# Patient Record
Sex: Male | Born: 1945 | Race: White | Hispanic: No | Marital: Married | Smoking: Never smoker
Health system: Southern US, Community
[De-identification: ages and names within clinical notes are randomized; demographics above are authoritative.]

## PROBLEM LIST (undated history)

## (undated) DIAGNOSIS — I10 Essential (primary) hypertension: Secondary | ICD-10-CM

## (undated) DIAGNOSIS — I2510 Atherosclerotic heart disease of native coronary artery without angina pectoris: Secondary | ICD-10-CM

## (undated) DIAGNOSIS — D649 Anemia, unspecified: Secondary | ICD-10-CM

## (undated) DIAGNOSIS — S81012A Laceration without foreign body, left knee, initial encounter: Secondary | ICD-10-CM

## (undated) DIAGNOSIS — D563 Thalassemia minor: Secondary | ICD-10-CM

## (undated) DIAGNOSIS — E119 Type 2 diabetes mellitus without complications: Secondary | ICD-10-CM

## (undated) DIAGNOSIS — D696 Thrombocytopenia, unspecified: Secondary | ICD-10-CM

## (undated) DIAGNOSIS — E785 Hyperlipidemia, unspecified: Secondary | ICD-10-CM

## (undated) DIAGNOSIS — Z7982 Long term (current) use of aspirin: Secondary | ICD-10-CM

## (undated) DIAGNOSIS — I498 Other specified cardiac arrhythmias: Secondary | ICD-10-CM

## (undated) DIAGNOSIS — N1831 Chronic kidney disease, stage 3a: Secondary | ICD-10-CM

## (undated) DIAGNOSIS — I200 Unstable angina: Secondary | ICD-10-CM

## (undated) DIAGNOSIS — M720 Palmar fascial fibromatosis [Dupuytren]: Secondary | ICD-10-CM

## (undated) DIAGNOSIS — R0600 Dyspnea, unspecified: Secondary | ICD-10-CM

## (undated) DIAGNOSIS — I447 Left bundle-branch block, unspecified: Secondary | ICD-10-CM

## (undated) HISTORY — PX: DUPUYTREN CONTRACTURE RELEASE: SHX1478

## (undated) HISTORY — PX: KNEE ARTHROSCOPY: SHX127

## (undated) HISTORY — DX: Essential (primary) hypertension: I10

## (undated) HISTORY — PX: EYE SURGERY: SHX253

---

## 2004-10-01 ENCOUNTER — Ambulatory Visit: Payer: Self-pay | Admitting: Family Medicine

## 2008-12-20 ENCOUNTER — Ambulatory Visit: Payer: Self-pay | Admitting: Family Medicine

## 2011-07-02 DIAGNOSIS — M6530 Trigger finger, unspecified finger: Secondary | ICD-10-CM | POA: Diagnosis not present

## 2011-07-05 DIAGNOSIS — Z23 Encounter for immunization: Secondary | ICD-10-CM | POA: Diagnosis not present

## 2011-07-08 DIAGNOSIS — E119 Type 2 diabetes mellitus without complications: Secondary | ICD-10-CM | POA: Diagnosis not present

## 2011-07-08 DIAGNOSIS — H2510 Age-related nuclear cataract, unspecified eye: Secondary | ICD-10-CM | POA: Diagnosis not present

## 2011-07-16 ENCOUNTER — Ambulatory Visit: Payer: Self-pay | Admitting: Unknown Physician Specialty

## 2011-07-16 DIAGNOSIS — E119 Type 2 diabetes mellitus without complications: Secondary | ICD-10-CM | POA: Diagnosis not present

## 2011-07-16 DIAGNOSIS — M720 Palmar fascial fibromatosis [Dupuytren]: Secondary | ICD-10-CM | POA: Diagnosis not present

## 2011-07-16 DIAGNOSIS — Z01812 Encounter for preprocedural laboratory examination: Secondary | ICD-10-CM | POA: Diagnosis not present

## 2011-07-16 DIAGNOSIS — Z01810 Encounter for preprocedural cardiovascular examination: Secondary | ICD-10-CM | POA: Diagnosis not present

## 2011-07-16 LAB — BASIC METABOLIC PANEL
Anion Gap: 8 (ref 7–16)
BUN: 19 mg/dL — ABNORMAL HIGH (ref 7–18)
Chloride: 105 mmol/L (ref 98–107)
Co2: 26 mmol/L (ref 21–32)
EGFR (African American): >60
EGFR (Non-African Amer.): >60
Glucose: 130 mg/dL — ABNORMAL HIGH (ref 65–99)
Osmolality: 282 (ref 275–301)
Potassium: 4.4 mmol/L (ref 3.5–5.1)
Sodium: 139 mmol/L (ref 136–145)

## 2011-07-16 LAB — URINALYSIS, COMPLETE
Bacteria: NONE SEEN
Bilirubin,UR: NEGATIVE
Blood: NEGATIVE
Ketone: NEGATIVE
Nitrite: NEGATIVE
Ph: 5 (ref 4.5–8.0)
Protein: NEGATIVE
RBC,UR: 1 /HPF (ref 0–5)
Squamous Epithelial: 2
WBC UR: 1 /HPF (ref 0–5)

## 2011-07-16 LAB — CBC
HCT: 40 % (ref 40.0–52.0)
MCH: 19.7 pg — ABNORMAL LOW (ref 26.0–34.0)
Platelet: 93 10*3/uL — ABNORMAL LOW (ref 150–440)
WBC: 6 10*3/uL (ref 3.8–10.6)

## 2011-07-18 DIAGNOSIS — Z01818 Encounter for other preprocedural examination: Secondary | ICD-10-CM | POA: Diagnosis not present

## 2011-07-18 DIAGNOSIS — I10 Essential (primary) hypertension: Secondary | ICD-10-CM | POA: Diagnosis not present

## 2011-07-18 DIAGNOSIS — E119 Type 2 diabetes mellitus without complications: Secondary | ICD-10-CM | POA: Diagnosis not present

## 2011-07-23 DIAGNOSIS — J029 Acute pharyngitis, unspecified: Secondary | ICD-10-CM | POA: Diagnosis not present

## 2011-07-23 DIAGNOSIS — J019 Acute sinusitis, unspecified: Secondary | ICD-10-CM | POA: Diagnosis not present

## 2011-08-05 DIAGNOSIS — D696 Thrombocytopenia, unspecified: Secondary | ICD-10-CM | POA: Diagnosis not present

## 2011-08-07 ENCOUNTER — Ambulatory Visit: Payer: Self-pay | Admitting: Unknown Physician Specialty

## 2011-08-07 DIAGNOSIS — E119 Type 2 diabetes mellitus without complications: Secondary | ICD-10-CM | POA: Diagnosis not present

## 2011-08-07 DIAGNOSIS — Z833 Family history of diabetes mellitus: Secondary | ICD-10-CM | POA: Diagnosis not present

## 2011-08-07 DIAGNOSIS — M720 Palmar fascial fibromatosis [Dupuytren]: Secondary | ICD-10-CM | POA: Diagnosis not present

## 2011-08-07 DIAGNOSIS — I10 Essential (primary) hypertension: Secondary | ICD-10-CM | POA: Diagnosis not present

## 2011-08-07 DIAGNOSIS — Z8249 Family history of ischemic heart disease and other diseases of the circulatory system: Secondary | ICD-10-CM | POA: Diagnosis not present

## 2011-08-07 DIAGNOSIS — Z79899 Other long term (current) drug therapy: Secondary | ICD-10-CM | POA: Diagnosis not present

## 2011-09-09 ENCOUNTER — Encounter: Payer: Self-pay | Admitting: Unknown Physician Specialty

## 2011-09-09 DIAGNOSIS — M25549 Pain in joints of unspecified hand: Secondary | ICD-10-CM | POA: Diagnosis not present

## 2011-09-09 DIAGNOSIS — M25649 Stiffness of unspecified hand, not elsewhere classified: Secondary | ICD-10-CM | POA: Diagnosis not present

## 2011-09-09 DIAGNOSIS — M6281 Muscle weakness (generalized): Secondary | ICD-10-CM | POA: Diagnosis not present

## 2011-09-09 DIAGNOSIS — R609 Edema, unspecified: Secondary | ICD-10-CM | POA: Diagnosis not present

## 2011-09-09 DIAGNOSIS — IMO0001 Reserved for inherently not codable concepts without codable children: Secondary | ICD-10-CM | POA: Diagnosis not present

## 2011-10-07 ENCOUNTER — Encounter: Payer: Self-pay | Admitting: Unknown Physician Specialty

## 2012-01-22 DIAGNOSIS — Z23 Encounter for immunization: Secondary | ICD-10-CM | POA: Diagnosis not present

## 2012-01-30 DIAGNOSIS — E119 Type 2 diabetes mellitus without complications: Secondary | ICD-10-CM | POA: Diagnosis not present

## 2012-01-30 DIAGNOSIS — I10 Essential (primary) hypertension: Secondary | ICD-10-CM | POA: Diagnosis not present

## 2012-02-04 ENCOUNTER — Ambulatory Visit: Payer: Self-pay | Admitting: Family Medicine

## 2012-02-04 DIAGNOSIS — Z0000 Encounter for general adult medical examination without abnormal findings: Secondary | ICD-10-CM | POA: Diagnosis not present

## 2012-02-04 DIAGNOSIS — R599 Enlarged lymph nodes, unspecified: Secondary | ICD-10-CM | POA: Diagnosis not present

## 2012-02-04 DIAGNOSIS — L988 Other specified disorders of the skin and subcutaneous tissue: Secondary | ICD-10-CM | POA: Diagnosis not present

## 2012-02-04 DIAGNOSIS — R5383 Other fatigue: Secondary | ICD-10-CM | POA: Diagnosis not present

## 2012-02-04 DIAGNOSIS — Z0389 Encounter for observation for other suspected diseases and conditions ruled out: Secondary | ICD-10-CM | POA: Diagnosis not present

## 2012-02-04 DIAGNOSIS — D649 Anemia, unspecified: Secondary | ICD-10-CM | POA: Diagnosis not present

## 2012-02-04 DIAGNOSIS — D485 Neoplasm of uncertain behavior of skin: Secondary | ICD-10-CM | POA: Diagnosis not present

## 2012-02-04 DIAGNOSIS — L570 Actinic keratosis: Secondary | ICD-10-CM | POA: Diagnosis not present

## 2012-02-11 DIAGNOSIS — L570 Actinic keratosis: Secondary | ICD-10-CM | POA: Diagnosis not present

## 2012-02-13 DIAGNOSIS — L049 Acute lymphadenitis, unspecified: Secondary | ICD-10-CM | POA: Diagnosis not present

## 2012-02-19 DIAGNOSIS — L0100 Impetigo, unspecified: Secondary | ICD-10-CM | POA: Diagnosis not present

## 2012-02-19 DIAGNOSIS — B356 Tinea cruris: Secondary | ICD-10-CM | POA: Diagnosis not present

## 2012-04-08 HISTORY — PX: COLONOSCOPY: SHX174

## 2012-04-28 ENCOUNTER — Ambulatory Visit: Payer: Self-pay | Admitting: Unknown Physician Specialty

## 2012-04-28 DIAGNOSIS — E119 Type 2 diabetes mellitus without complications: Secondary | ICD-10-CM | POA: Diagnosis not present

## 2012-04-28 DIAGNOSIS — K5730 Diverticulosis of large intestine without perforation or abscess without bleeding: Secondary | ICD-10-CM | POA: Diagnosis not present

## 2012-04-28 DIAGNOSIS — Z79899 Other long term (current) drug therapy: Secondary | ICD-10-CM | POA: Diagnosis not present

## 2012-04-28 DIAGNOSIS — K648 Other hemorrhoids: Secondary | ICD-10-CM | POA: Diagnosis not present

## 2012-04-28 DIAGNOSIS — I10 Essential (primary) hypertension: Secondary | ICD-10-CM | POA: Diagnosis not present

## 2012-04-28 DIAGNOSIS — Z1211 Encounter for screening for malignant neoplasm of colon: Secondary | ICD-10-CM | POA: Diagnosis not present

## 2012-08-03 DIAGNOSIS — E782 Mixed hyperlipidemia: Secondary | ICD-10-CM | POA: Diagnosis not present

## 2012-08-03 DIAGNOSIS — I10 Essential (primary) hypertension: Secondary | ICD-10-CM | POA: Diagnosis not present

## 2012-10-10 ENCOUNTER — Ambulatory Visit: Payer: Self-pay | Admitting: Family Medicine

## 2012-10-11 ENCOUNTER — Ambulatory Visit: Payer: Self-pay | Admitting: Family Medicine

## 2012-10-11 DIAGNOSIS — E119 Type 2 diabetes mellitus without complications: Secondary | ICD-10-CM | POA: Diagnosis not present

## 2012-10-11 DIAGNOSIS — S91309A Unspecified open wound, unspecified foot, initial encounter: Secondary | ICD-10-CM | POA: Diagnosis not present

## 2012-10-11 DIAGNOSIS — I10 Essential (primary) hypertension: Secondary | ICD-10-CM | POA: Diagnosis not present

## 2012-10-11 DIAGNOSIS — Z79899 Other long term (current) drug therapy: Secondary | ICD-10-CM | POA: Diagnosis not present

## 2012-10-26 DIAGNOSIS — E119 Type 2 diabetes mellitus without complications: Secondary | ICD-10-CM | POA: Diagnosis not present

## 2012-10-26 DIAGNOSIS — H2510 Age-related nuclear cataract, unspecified eye: Secondary | ICD-10-CM | POA: Diagnosis not present

## 2012-11-02 DIAGNOSIS — E119 Type 2 diabetes mellitus without complications: Secondary | ICD-10-CM | POA: Diagnosis not present

## 2012-11-11 DIAGNOSIS — L570 Actinic keratosis: Secondary | ICD-10-CM | POA: Diagnosis not present

## 2012-11-11 DIAGNOSIS — T148 Other injury of unspecified body region: Secondary | ICD-10-CM | POA: Diagnosis not present

## 2012-11-11 DIAGNOSIS — Z1283 Encounter for screening for malignant neoplasm of skin: Secondary | ICD-10-CM | POA: Diagnosis not present

## 2012-11-11 DIAGNOSIS — D239 Other benign neoplasm of skin, unspecified: Secondary | ICD-10-CM | POA: Diagnosis not present

## 2013-01-14 DIAGNOSIS — I10 Essential (primary) hypertension: Secondary | ICD-10-CM | POA: Diagnosis not present

## 2013-01-14 DIAGNOSIS — I2510 Atherosclerotic heart disease of native coronary artery without angina pectoris: Secondary | ICD-10-CM | POA: Diagnosis not present

## 2013-01-14 DIAGNOSIS — E119 Type 2 diabetes mellitus without complications: Secondary | ICD-10-CM | POA: Diagnosis not present

## 2013-02-12 DIAGNOSIS — Z23 Encounter for immunization: Secondary | ICD-10-CM | POA: Diagnosis not present

## 2013-06-15 DIAGNOSIS — N529 Male erectile dysfunction, unspecified: Secondary | ICD-10-CM | POA: Diagnosis not present

## 2013-06-15 DIAGNOSIS — E1169 Type 2 diabetes mellitus with other specified complication: Secondary | ICD-10-CM | POA: Diagnosis not present

## 2013-06-15 DIAGNOSIS — E119 Type 2 diabetes mellitus without complications: Secondary | ICD-10-CM | POA: Diagnosis not present

## 2013-06-15 DIAGNOSIS — I10 Essential (primary) hypertension: Secondary | ICD-10-CM | POA: Diagnosis not present

## 2013-09-08 DIAGNOSIS — B356 Tinea cruris: Secondary | ICD-10-CM | POA: Diagnosis not present

## 2013-09-08 DIAGNOSIS — N529 Male erectile dysfunction, unspecified: Secondary | ICD-10-CM | POA: Diagnosis not present

## 2013-11-10 DIAGNOSIS — Z1283 Encounter for screening for malignant neoplasm of skin: Secondary | ICD-10-CM | POA: Diagnosis not present

## 2013-11-10 DIAGNOSIS — L570 Actinic keratosis: Secondary | ICD-10-CM | POA: Diagnosis not present

## 2013-11-16 DIAGNOSIS — H2510 Age-related nuclear cataract, unspecified eye: Secondary | ICD-10-CM | POA: Diagnosis not present

## 2013-11-16 DIAGNOSIS — E119 Type 2 diabetes mellitus without complications: Secondary | ICD-10-CM | POA: Diagnosis not present

## 2013-11-25 DIAGNOSIS — L570 Actinic keratosis: Secondary | ICD-10-CM | POA: Diagnosis not present

## 2013-12-09 DIAGNOSIS — L570 Actinic keratosis: Secondary | ICD-10-CM | POA: Diagnosis not present

## 2014-01-12 DIAGNOSIS — E119 Type 2 diabetes mellitus without complications: Secondary | ICD-10-CM | POA: Diagnosis not present

## 2014-01-12 DIAGNOSIS — I10 Essential (primary) hypertension: Secondary | ICD-10-CM | POA: Diagnosis not present

## 2014-02-07 DIAGNOSIS — Z23 Encounter for immunization: Secondary | ICD-10-CM | POA: Diagnosis not present

## 2014-03-11 DIAGNOSIS — I10 Essential (primary) hypertension: Secondary | ICD-10-CM | POA: Diagnosis not present

## 2014-03-11 DIAGNOSIS — E119 Type 2 diabetes mellitus without complications: Secondary | ICD-10-CM | POA: Diagnosis not present

## 2014-03-11 DIAGNOSIS — N521 Erectile dysfunction due to diseases classified elsewhere: Secondary | ICD-10-CM | POA: Diagnosis not present

## 2014-03-11 DIAGNOSIS — E1169 Type 2 diabetes mellitus with other specified complication: Secondary | ICD-10-CM | POA: Diagnosis not present

## 2014-05-09 DIAGNOSIS — E119 Type 2 diabetes mellitus without complications: Secondary | ICD-10-CM | POA: Diagnosis not present

## 2014-09-22 ENCOUNTER — Other Ambulatory Visit: Payer: Self-pay | Admitting: Family Medicine

## 2014-09-22 DIAGNOSIS — E119 Type 2 diabetes mellitus without complications: Secondary | ICD-10-CM

## 2014-09-27 ENCOUNTER — Other Ambulatory Visit: Payer: Self-pay | Admitting: Family Medicine

## 2014-09-27 DIAGNOSIS — E119 Type 2 diabetes mellitus without complications: Secondary | ICD-10-CM

## 2014-10-27 ENCOUNTER — Other Ambulatory Visit: Payer: Self-pay | Admitting: Family Medicine

## 2014-11-07 DIAGNOSIS — Z1283 Encounter for screening for malignant neoplasm of skin: Secondary | ICD-10-CM | POA: Diagnosis not present

## 2014-11-07 DIAGNOSIS — Z872 Personal history of diseases of the skin and subcutaneous tissue: Secondary | ICD-10-CM | POA: Diagnosis not present

## 2014-11-07 DIAGNOSIS — L570 Actinic keratosis: Secondary | ICD-10-CM | POA: Diagnosis not present

## 2014-11-07 DIAGNOSIS — Z09 Encounter for follow-up examination after completed treatment for conditions other than malignant neoplasm: Secondary | ICD-10-CM | POA: Diagnosis not present

## 2014-11-09 ENCOUNTER — Other Ambulatory Visit: Payer: Self-pay | Admitting: Family Medicine

## 2014-11-09 DIAGNOSIS — I10 Essential (primary) hypertension: Secondary | ICD-10-CM

## 2014-11-28 ENCOUNTER — Other Ambulatory Visit: Payer: Self-pay

## 2014-12-01 ENCOUNTER — Ambulatory Visit (INDEPENDENT_AMBULATORY_CARE_PROVIDER_SITE_OTHER): Payer: Medicare Other | Admitting: Family Medicine

## 2014-12-01 ENCOUNTER — Encounter: Payer: Self-pay | Admitting: Family Medicine

## 2014-12-01 VITALS — BP 130/80 | HR 60 | Ht 70.0 in | Wt 208.0 lb

## 2014-12-01 DIAGNOSIS — E119 Type 2 diabetes mellitus without complications: Secondary | ICD-10-CM | POA: Diagnosis not present

## 2014-12-01 DIAGNOSIS — I10 Essential (primary) hypertension: Secondary | ICD-10-CM

## 2014-12-01 DIAGNOSIS — Z23 Encounter for immunization: Secondary | ICD-10-CM | POA: Diagnosis not present

## 2014-12-01 MED ORDER — LOSARTAN POTASSIUM 50 MG PO TABS: 50.0000 mg | ORAL_TABLET | Freq: Every day | ORAL | Status: DC

## 2014-12-01 MED ORDER — METFORMIN HCL 1000 MG PO TABS: 1000.0000 mg | ORAL_TABLET | Freq: Two times a day (BID) | ORAL | Status: DC

## 2014-12-01 MED ORDER — GLIPIZIDE 5 MG PO TABS: 5.0000 mg | ORAL_TABLET | Freq: Two times a day (BID) | ORAL | Status: DC

## 2014-12-01 NOTE — Progress Notes (Signed)
Name: [name]   MRN: [id number]    DOB: Mar 05, 1946   Date:12/01/2014       Progress Note  Subjective  Chief Complaint  Chief Complaint  Patient presents with  . Hypertension  . Diabetes    Hypertension This is a chronic problem. The current episode started more than 1 year ago. The problem has been gradually improving since onset. Pertinent negatives include no anxiety, blurred vision, chest pain, headaches, malaise/fatigue, neck pain, orthopnea, palpitations, peripheral edema, PND, shortness of breath or sweats. There are no associated agents to hypertension. Risk factors for coronary artery disease include diabetes mellitus, dyslipidemia and obesity. Past treatments include angiotensin blockers. The current treatment provides moderate improvement. There are no compliance problems.  There is no history of angina, kidney disease, CAD/MI, CVA, heart failure, left ventricular hypertrophy, PVD, renovascular disease or retinopathy. There is no history of chronic renal disease.  Diabetes He presents for his follow-up diabetic visit. He has type 2 diabetes mellitus. His disease course has been improving. Pertinent negatives for hypoglycemia include no confusion, dizziness, headaches, hunger, mood changes, nervousness/anxiousness, pallor, seizures, sleepiness, speech difficulty, sweats or tremors. Pertinent negatives for diabetes include no blurred vision, no chest pain, no fatigue, no foot paresthesias, no foot ulcerations, no polydipsia, no polyphagia, no polyuria, no visual change, no weakness and no weight loss. There are no hypoglycemic complications. Pertinent negatives for diabetic complications include no autonomic neuropathy, CVA, heart disease, impotence, nephropathy, peripheral neuropathy, PVD or retinopathy. Current diabetic treatment includes oral agent (dual therapy). He is compliant with treatment all of the time. His weight is stable. He is following a generally healthy diet. He  participates in exercise daily. There is no change in his home blood glucose trend. His breakfast blood glucose is taken between 7-8 am. His breakfast blood glucose range is generally 90-110 mg/dl. An ACE inhibitor/angiotensin II receptor blocker is being taken. He does not see a podiatrist.Eye exam is not current.    No problem-specific assessment & plan notes found for this encounter.   Past Medical History  Diagnosis Date  . Diabetes mellitus without complication   . Hypertension     Past Surgical History  Procedure Laterality Date  . Hand surgery    . Knee surgery    . Colonoscopy  2014    cleared for 10 yrs    Family History  Problem Relation Age of Onset  . Diabetes Mother     Social History   Social History  . Marital Status: Married    Spouse Name: N/A  . Number of Children: N/A  . Years of Education: N/A   Occupational History  . Not on file.   Social History Main Topics  . Smoking status: Never Smoker   . Smokeless tobacco: Not on file  . Alcohol Use: No  . Drug Use: No  . Sexual Activity: Not Currently   Other Topics Concern  . Not on file   Social History Narrative  . No narrative on file    No Known Allergies   Review of Systems  Constitutional: Negative for fever, chills, weight loss, malaise/fatigue and fatigue.  HENT: Negative for ear discharge, ear pain and sore throat.   Eyes: Negative for blurred vision.  Respiratory: Negative for cough, sputum production, shortness of breath and wheezing.   Cardiovascular: Negative for chest pain, palpitations, orthopnea, leg swelling and PND.  Gastrointestinal: Negative for heartburn, nausea, abdominal pain, diarrhea, constipation, blood in stool and melena.  Genitourinary: Negative for  dysuria, urgency, frequency, hematuria and impotence.  Musculoskeletal: Negative for myalgias, back pain, joint pain and neck pain.  Skin: Negative for pallor and rash.  Neurological: Negative for dizziness, tingling,  tremors, sensory change, focal weakness, seizures, speech difficulty, weakness and headaches.  Endo/Heme/Allergies: Negative for environmental allergies, polydipsia and polyphagia. Does not bruise/bleed easily.  Psychiatric/Behavioral: Negative for depression, suicidal ideas and confusion. The patient is not nervous/anxious and does not have insomnia.      Objective  Filed Vitals:   12/01/14 0815  BP: 130/80  Pulse: 60  Height: 5\' 10"  (1.778 m)  Weight: 208 lb (94.348 kg)    Physical Exam  Constitutional: He is oriented to person, place, and time and well-developed, well-nourished, and in no distress.  HENT:  Head: Normocephalic.  Right Ear: External ear normal.  Left Ear: External ear normal.  Nose: Nose normal.  Mouth/Throat: Oropharynx is clear and moist.  Eyes: Conjunctivae and EOM are normal. Pupils are equal, round, and reactive to light. Right eye exhibits no discharge. Left eye exhibits no discharge. No scleral icterus.  Neck: Normal range of motion. Neck supple. No JVD present. No tracheal deviation present. No thyromegaly present.  Cardiovascular: Normal rate, regular rhythm, normal heart sounds and intact distal pulses.  Exam reveals no gallop and no friction rub.   No murmur heard. Pulmonary/Chest: Breath sounds normal. No respiratory distress. He has no wheezes. He has no rales.  Abdominal: Soft. Bowel sounds are normal. He exhibits no mass. There is no hepatosplenomegaly. There is no tenderness. There is no rebound, no guarding and no CVA tenderness.  Musculoskeletal: Normal range of motion. He exhibits no edema or tenderness.  Lymphadenopathy:    He has no cervical adenopathy.  Neurological: He is alert and oriented to person, place, and time. He has normal sensation, normal strength, normal reflexes and intact cranial nerves. No cranial nerve deficit.  Skin: Skin is warm. No rash noted.  Psychiatric: Mood and affect normal.  Nursing note and vitals  reviewed.     Assessment & Plan  Problem List Items Addressed This Visit    None    Visit Diagnoses    Type 2 diabetes mellitus without complication    -  Primary    Relevant Medications    aspirin 81 MG tablet    glipiZIDE (GLUCOTROL) 5 MG tablet    losartan (COZAAR) 50 MG tablet    metFORMIN (GLUCOPHAGE) 1000 MG tablet    Other Relevant Orders    Renal Function Panel    HgB A1c    Lipid Profile    Tdap vaccine greater than or equal to 7yo IM    Pneumococcal conjugate vaccine 13-valent    Essential hypertension        Relevant Medications    aspirin 81 MG tablet    losartan (COZAAR) 50 MG tablet    Other Relevant Orders    Renal Function Panel    Tdap vaccine greater than or equal to 7yo IM    Pneumococcal conjugate vaccine 13-valent    Need for Tdap vaccination        Relevant Orders    Tdap vaccine greater than or equal to 7yo IM    Need for pneumococcal vaccination        Relevant Orders    Pneumococcal conjugate vaccine 13-valent         Dr. Deanna Jones [facility]  12/01/2014

## 2014-12-02 LAB — LIPID PANEL
CHOL/HDL RATIO: 5 ratio (ref 0.0–5.0)
Cholesterol, Total: 160 mg/dL (ref 100–199)
HDL: 32 mg/dL — ABNORMAL LOW (ref >39)
LDL CALC: 103 mg/dL — AB (ref 0–99)
TRIGLYCERIDES: 125 mg/dL (ref 0–149)
VLDL Cholesterol Cal: 25 mg/dL (ref 5–40)

## 2014-12-02 LAB — HEMOGLOBIN A1C
Est. average glucose Bld gHb Est-mCnc: 148 mg/dL
Hgb A1c MFr Bld: 6.8 % — ABNORMAL HIGH (ref 4.8–5.6)

## 2014-12-02 LAB — RENAL FUNCTION PANEL
Albumin: 4.9 g/dL — ABNORMAL HIGH (ref 3.6–4.8)
BUN / CREAT RATIO: 16 (ref 10–22)
BUN: 20 mg/dL (ref 8–27)
CHLORIDE: 104 mmol/L (ref 97–108)
CO2: 22 mmol/L (ref 18–29)
Calcium: 9.4 mg/dL (ref 8.6–10.2)
Creatinine, Ser: 1.27 mg/dL (ref 0.76–1.27)
GFR calc non Af Amer: 57 mL/min/{1.73_m2} — ABNORMAL LOW (ref >59)
GFR, EST AFRICAN AMERICAN: 66 mL/min/{1.73_m2} (ref >59)
GLUCOSE: 122 mg/dL — AB (ref 65–99)
POTASSIUM: 4.8 mmol/L (ref 3.5–5.2)
Phosphorus: 2.6 mg/dL (ref 2.5–4.5)
SODIUM: 143 mmol/L (ref 134–144)

## 2014-12-02 LAB — MICROALBUMIN / CREATININE URINE RATIO
Creatinine, Urine: 125.5 mg/dL
MICROALB/CREAT RATIO: 19 mg/g{creat} (ref 0.0–30.0)
Microalbumin, Urine: 23.8 ug/mL

## 2015-01-17 DIAGNOSIS — I10 Essential (primary) hypertension: Secondary | ICD-10-CM | POA: Diagnosis not present

## 2015-01-17 DIAGNOSIS — Z8679 Personal history of other diseases of the circulatory system: Secondary | ICD-10-CM | POA: Diagnosis not present

## 2015-02-01 DIAGNOSIS — E119 Type 2 diabetes mellitus without complications: Secondary | ICD-10-CM | POA: Diagnosis not present

## 2015-02-01 DIAGNOSIS — H2513 Age-related nuclear cataract, bilateral: Secondary | ICD-10-CM | POA: Diagnosis not present

## 2015-02-02 DIAGNOSIS — Z23 Encounter for immunization: Secondary | ICD-10-CM | POA: Diagnosis not present

## 2015-04-16 ENCOUNTER — Observation Stay
Admission: EM | Admit: 2015-04-16 | Discharge: 2015-04-19 | Payer: Medicare Other | Attending: Internal Medicine | Admitting: Internal Medicine

## 2015-04-16 DIAGNOSIS — Z833 Family history of diabetes mellitus: Secondary | ICD-10-CM | POA: Insufficient documentation

## 2015-04-16 DIAGNOSIS — R079 Chest pain, unspecified: Secondary | ICD-10-CM

## 2015-04-16 DIAGNOSIS — R7989 Other specified abnormal findings of blood chemistry: Secondary | ICD-10-CM | POA: Diagnosis present

## 2015-04-16 DIAGNOSIS — E1122 Type 2 diabetes mellitus with diabetic chronic kidney disease: Secondary | ICD-10-CM | POA: Diagnosis not present

## 2015-04-16 DIAGNOSIS — R9431 Abnormal electrocardiogram [ECG] [EKG]: Secondary | ICD-10-CM | POA: Insufficient documentation

## 2015-04-16 DIAGNOSIS — I214 Non-ST elevation (NSTEMI) myocardial infarction: Secondary | ICD-10-CM

## 2015-04-16 DIAGNOSIS — I25110 Atherosclerotic heart disease of native coronary artery with unstable angina pectoris: Principal | ICD-10-CM | POA: Insufficient documentation

## 2015-04-16 DIAGNOSIS — I10 Essential (primary) hypertension: Secondary | ICD-10-CM | POA: Diagnosis not present

## 2015-04-16 DIAGNOSIS — R748 Abnormal levels of other serum enzymes: Secondary | ICD-10-CM | POA: Insufficient documentation

## 2015-04-16 DIAGNOSIS — Z7982 Long term (current) use of aspirin: Secondary | ICD-10-CM | POA: Insufficient documentation

## 2015-04-16 DIAGNOSIS — N183 Chronic kidney disease, stage 3 unspecified: Secondary | ICD-10-CM

## 2015-04-16 DIAGNOSIS — Z951 Presence of aortocoronary bypass graft: Secondary | ICD-10-CM | POA: Diagnosis not present

## 2015-04-16 DIAGNOSIS — R0602 Shortness of breath: Secondary | ICD-10-CM | POA: Diagnosis not present

## 2015-04-16 DIAGNOSIS — Z794 Long term (current) use of insulin: Secondary | ICD-10-CM | POA: Insufficient documentation

## 2015-04-16 DIAGNOSIS — N189 Chronic kidney disease, unspecified: Secondary | ICD-10-CM | POA: Diagnosis not present

## 2015-04-16 DIAGNOSIS — I129 Hypertensive chronic kidney disease with stage 1 through stage 4 chronic kidney disease, or unspecified chronic kidney disease: Secondary | ICD-10-CM | POA: Diagnosis not present

## 2015-04-16 DIAGNOSIS — I200 Unstable angina: Secondary | ICD-10-CM | POA: Diagnosis present

## 2015-04-16 DIAGNOSIS — Z8249 Family history of ischemic heart disease and other diseases of the circulatory system: Secondary | ICD-10-CM | POA: Diagnosis not present

## 2015-04-16 DIAGNOSIS — E119 Type 2 diabetes mellitus without complications: Secondary | ICD-10-CM

## 2015-04-16 DIAGNOSIS — I259 Chronic ischemic heart disease, unspecified: Secondary | ICD-10-CM | POA: Insufficient documentation

## 2015-04-16 DIAGNOSIS — I2510 Atherosclerotic heart disease of native coronary artery without angina pectoris: Secondary | ICD-10-CM

## 2015-04-16 DIAGNOSIS — R0789 Other chest pain: Secondary | ICD-10-CM | POA: Diagnosis not present

## 2015-04-16 DIAGNOSIS — R778 Other specified abnormalities of plasma proteins: Secondary | ICD-10-CM | POA: Diagnosis present

## 2015-04-16 HISTORY — DX: Non-ST elevation (NSTEMI) myocardial infarction: I21.4

## 2015-04-16 NOTE — ED Notes (Signed)
Pt states that he has been having chest tightness all day across the top of his chest, states that it has been intermittent. Pt denies ever feeling this way before, no nausea or sob

## 2015-04-17 ENCOUNTER — Emergency Department: Payer: Medicare Other

## 2015-04-17 ENCOUNTER — Observation Stay
Admit: 2015-04-17 | Discharge: 2015-04-17 | Disposition: A | Discharge status: Home / Self Care | Payer: Medicare Other | Attending: Internal Medicine | Admitting: Internal Medicine

## 2015-04-17 ENCOUNTER — Encounter: Payer: Self-pay | Admitting: Internal Medicine

## 2015-04-17 DIAGNOSIS — R778 Other specified abnormalities of plasma proteins: Secondary | ICD-10-CM | POA: Diagnosis present

## 2015-04-17 DIAGNOSIS — I10 Essential (primary) hypertension: Secondary | ICD-10-CM | POA: Diagnosis present

## 2015-04-17 DIAGNOSIS — I25110 Atherosclerotic heart disease of native coronary artery with unstable angina pectoris: Secondary | ICD-10-CM | POA: Diagnosis not present

## 2015-04-17 DIAGNOSIS — R7989 Other specified abnormal findings of blood chemistry: Secondary | ICD-10-CM | POA: Diagnosis present

## 2015-04-17 DIAGNOSIS — N183 Chronic kidney disease, stage 3 unspecified: Secondary | ICD-10-CM

## 2015-04-17 DIAGNOSIS — E1122 Type 2 diabetes mellitus with diabetic chronic kidney disease: Secondary | ICD-10-CM

## 2015-04-17 DIAGNOSIS — I214 Non-ST elevation (NSTEMI) myocardial infarction: Secondary | ICD-10-CM | POA: Diagnosis not present

## 2015-04-17 DIAGNOSIS — I200 Unstable angina: Secondary | ICD-10-CM | POA: Diagnosis not present

## 2015-04-17 DIAGNOSIS — I209 Angina pectoris, unspecified: Secondary | ICD-10-CM | POA: Diagnosis not present

## 2015-04-17 DIAGNOSIS — E119 Type 2 diabetes mellitus without complications: Secondary | ICD-10-CM

## 2015-04-17 DIAGNOSIS — R0789 Other chest pain: Secondary | ICD-10-CM | POA: Diagnosis not present

## 2015-04-17 LAB — CBC
HEMATOCRIT: 37.4 % — AB (ref 40.0–52.0)
HEMATOCRIT: 41 % (ref 40.0–52.0)
HEMOGLOBIN: 11.9 g/dL — AB (ref 13.0–18.0)
Hemoglobin: 13.3 g/dL (ref 13.0–18.0)
MCH: 19.3 pg — AB (ref 26.0–34.0)
MCH: 19.9 pg — AB (ref 26.0–34.0)
MCHC: 31.7 g/dL — AB (ref 32.0–36.0)
MCHC: 32.5 g/dL (ref 32.0–36.0)
MCV: 61 fL — AB (ref 80.0–100.0)
MCV: 61.3 fL — AB (ref 80.0–100.0)
Platelets: 116 10*3/uL — ABNORMAL LOW (ref 150–440)
Platelets: 88 10*3/uL — ABNORMAL LOW (ref 150–440)
RBC: 6.13 MIL/uL — ABNORMAL HIGH (ref 4.40–5.90)
RBC: 6.68 MIL/uL — ABNORMAL HIGH (ref 4.40–5.90)
RDW: 16.4 % — AB (ref 11.5–14.5)
RDW: 16.8 % — AB (ref 11.5–14.5)
WBC: 6.1 10*3/uL (ref 3.8–10.6)
WBC: 7 10*3/uL (ref 3.8–10.6)

## 2015-04-17 LAB — BASIC METABOLIC PANEL
ANION GAP: 7 (ref 5–15)
BUN: 27 mg/dL — AB (ref 6–20)
BUN: 29 mg/dL — ABNORMAL HIGH (ref 6–20)
CHLORIDE: 104 mmol/L (ref 101–111)
CO2: 28 mmol/L (ref 22–32)
CO2: 30 mmol/L (ref 22–32)
Calcium: 9.7 mg/dL (ref 8.9–10.3)
Calcium: 9.9 mg/dL (ref 8.9–10.3)
Chloride: 102 mmol/L (ref 101–111)
Creatinine, Ser: 1.32 mg/dL — ABNORMAL HIGH (ref 0.61–1.24)
Creatinine, Ser: 1.42 mg/dL — ABNORMAL HIGH (ref 0.61–1.24)
GFR calc Af Amer: 57 mL/min — ABNORMAL LOW (ref >60)
GFR calc Af Amer: >60 mL/min (ref >60)
GFR, EST NON AFRICAN AMERICAN: 49 mL/min — AB (ref >60)
GFR, EST NON AFRICAN AMERICAN: 53 mL/min — AB (ref >60)
GLUCOSE: 154 mg/dL — AB (ref 65–99)
GLUCOSE: 179 mg/dL — AB (ref 65–99)
POTASSIUM: 4.3 mmol/L (ref 3.5–5.1)
POTASSIUM: 5.1 mmol/L (ref 3.5–5.1)
Sodium: 139 mmol/L (ref 135–145)

## 2015-04-17 LAB — TROPONIN I
TROPONIN I: 0.89 ng/mL — AB (ref <0.031)
Troponin I: 0.06 ng/mL — ABNORMAL HIGH (ref <0.031)
Troponin I: 0.35 ng/mL — ABNORMAL HIGH (ref <0.031)

## 2015-04-17 LAB — HEMOGLOBIN A1C: Hgb A1c MFr Bld: 7.5 % — ABNORMAL HIGH (ref 4.0–6.0)

## 2015-04-17 LAB — GLUCOSE, CAPILLARY
GLUCOSE-CAPILLARY: 179 mg/dL — AB (ref 65–99)
GLUCOSE-CAPILLARY: 277 mg/dL — AB (ref 65–99)
Glucose-Capillary: 173 mg/dL — ABNORMAL HIGH (ref 65–99)

## 2015-04-17 MED ORDER — INSULIN ASPART 100 UNIT/ML ~~LOC~~ SOLN
0.0000 [IU] | Freq: Four times a day (QID) | SUBCUTANEOUS | Status: DC
  Administered 2015-04-18: 3 [IU] via SUBCUTANEOUS
  Administered 2015-04-18: 2 [IU] via SUBCUTANEOUS
  Administered 2015-04-18: 3 [IU] via SUBCUTANEOUS
  Administered 2015-04-18: 5 [IU] via SUBCUTANEOUS
  Administered 2015-04-19 (×2): 2 [IU] via SUBCUTANEOUS
  Filled 2015-04-17: qty 2
  Filled 2015-04-17: qty 3
  Filled 2015-04-17: qty 5
  Filled 2015-04-17: qty 2
  Filled 2015-04-17: qty 3
  Filled 2015-04-17: qty 2

## 2015-04-17 MED ORDER — NITROGLYCERIN 0.4 MG SL SUBL
0.4000 mg | SUBLINGUAL_TABLET | SUBLINGUAL | Status: DC | PRN
  Administered 2015-04-17: 0.4 mg via SUBLINGUAL
  Filled 2015-04-17: qty 1

## 2015-04-17 MED ORDER — SODIUM CHLORIDE 0.9 % IJ SOLN
3.0000 mL | Freq: Two times a day (BID) | INTRAMUSCULAR | Status: DC
  Administered 2015-04-17 – 2015-04-18 (×4): 3 mL via INTRAVENOUS

## 2015-04-17 MED ORDER — SODIUM CHLORIDE 0.9 % IJ SOLN: 3.0000 mL | INTRAMUSCULAR | Status: DC | PRN

## 2015-04-17 MED ORDER — INSULIN ASPART 100 UNIT/ML ~~LOC~~ SOLN
0.0000 [IU] | Freq: Four times a day (QID) | SUBCUTANEOUS | Status: DC
  Administered 2015-04-17: 2 [IU] via SUBCUTANEOUS
  Administered 2015-04-17: 5 [IU] via SUBCUTANEOUS
  Filled 2015-04-17: qty 2
  Filled 2015-04-17: qty 5

## 2015-04-17 MED ORDER — ONDANSETRON HCL 4 MG PO TABS: 4.0000 mg | ORAL_TABLET | Freq: Four times a day (QID) | ORAL | Status: DC | PRN

## 2015-04-17 MED ORDER — ACETAMINOPHEN 325 MG PO TABS
650.0000 mg | ORAL_TABLET | Freq: Four times a day (QID) | ORAL | Status: DC | PRN
  Administered 2015-04-18: 650 mg via ORAL
  Filled 2015-04-17: qty 2

## 2015-04-17 MED ORDER — ASPIRIN EC 81 MG PO TBEC
81.0000 mg | DELAYED_RELEASE_TABLET | Freq: Every day | ORAL | Status: DC
  Administered 2015-04-17: 81 mg via ORAL
  Filled 2015-04-17 (×2): qty 1

## 2015-04-17 MED ORDER — ENOXAPARIN SODIUM 40 MG/0.4ML ~~LOC~~ SOLN
40.0000 mg | Freq: Every day | SUBCUTANEOUS | Status: DC
  Administered 2015-04-17: 40 mg via SUBCUTANEOUS
  Filled 2015-04-17: qty 0.4

## 2015-04-17 MED ORDER — ASPIRIN 81 MG PO CHEW
324.0000 mg | CHEWABLE_TABLET | ORAL | Status: AC
  Administered 2015-04-18: 324 mg via ORAL
  Filled 2015-04-17: qty 4

## 2015-04-17 MED ORDER — ONDANSETRON HCL 4 MG/2ML IJ SOLN: 4.0000 mg | Freq: Four times a day (QID) | INTRAMUSCULAR | Status: DC | PRN

## 2015-04-17 MED ORDER — ASPIRIN 81 MG PO CHEW: 81.0000 mg | CHEWABLE_TABLET | ORAL | Status: AC

## 2015-04-17 MED ORDER — ASPIRIN 81 MG PO CHEW
324.0000 mg | CHEWABLE_TABLET | Freq: Once | ORAL | Status: AC
  Administered 2015-04-17: 324 mg via ORAL
  Filled 2015-04-17: qty 4

## 2015-04-17 MED ORDER — SODIUM CHLORIDE 0.9 % IJ SOLN: 3.0000 mL | Freq: Two times a day (BID) | INTRAMUSCULAR | Status: DC

## 2015-04-17 MED ORDER — SODIUM CHLORIDE 0.9 % IV SOLN: 250.0000 mL | INTRAVENOUS | Status: DC | PRN

## 2015-04-17 MED ORDER — NITROGLYCERIN 2 % TD OINT
0.5000 [in_us] | TOPICAL_OINTMENT | Freq: Four times a day (QID) | TRANSDERMAL | Status: DC
  Administered 2015-04-17 – 2015-04-19 (×6): 0.5 [in_us] via TOPICAL
  Filled 2015-04-17 (×7): qty 1

## 2015-04-17 MED ORDER — ACETAMINOPHEN 650 MG RE SUPP: 650.0000 mg | Freq: Four times a day (QID) | RECTAL | Status: DC | PRN

## 2015-04-17 MED ORDER — SODIUM CHLORIDE 0.9 % WEIGHT BASED INFUSION: 1.0000 mL/kg/h | INTRAVENOUS | Status: DC

## 2015-04-17 MED ORDER — SODIUM CHLORIDE 0.9 % WEIGHT BASED INFUSION
3.0000 mL/kg/h | INTRAVENOUS | Status: AC
  Administered 2015-04-18: 3 mL/kg/h via INTRAVENOUS

## 2015-04-17 MED ORDER — LOSARTAN POTASSIUM 50 MG PO TABS
50.0000 mg | ORAL_TABLET | Freq: Every day | ORAL | Status: DC
  Administered 2015-04-17 – 2015-04-18 (×2): 50 mg via ORAL
  Filled 2015-04-17 (×2): qty 1

## 2015-04-17 NOTE — Consult Note (Signed)
Reason for Consult: Unstable angina elevated troponin abnormal EKG Referring Physician: Dr. Otilio Miu, Dr. Jannifer Franklin hospitalist  [name] is an 70 y.o. male.  HPI: 71 year old white male history of hypertension diabetes abnormal EKG at baseline presents with anginal symptoms over the last 24 hours midsternal chest pain tightness with shortness of breath while at rest. This pain is known unusual forms he finally came in to the emergency room for evaluation and was subsequently admitted. Troponins have been elevated and if continue to rise his pain has been improved well on Lovenox and on telemetry. Because of elevated troponins abnormal EKG and anginal symptoms cardiology consultation was recommended. Patient states 5 to diabetes and hypertensive medications doesn't smoke.  Past Medical History  Diagnosis Date  . Diabetes mellitus without complication ([place])   . Hypertension     Past Surgical History  Procedure Laterality Date  . Hand surgery    . Knee surgery    . Colonoscopy  2014    cleared for 10 yrs    Family History  Problem Relation Age of Onset  . Diabetes Mother   . Heart attack    . Hypertension      Social History:  reports that he has never smoked. He does not have any smokeless tobacco history on file. He reports that he does not drink alcohol or use illicit drugs.  Allergies: No Known Allergies  Medications: I have reviewed the patient's current medications.  Results for orders placed or performed during the hospital encounter of 04/16/15 (from the past 48 hour(s))  Basic metabolic panel     Status: Abnormal   Collection Time: 04/16/15 11:45 PM  Result Value Ref Range   Sodium SEE COMMENTS 135 - 145 mmol/L    Comment: UNABLE TO PERFORM DUE TO LIPEMIC INTERFERENCE   Potassium 4.3 3.5 - 5.1 mmol/L    Comment: HEMOLYSIS AT THIS LEVEL MAY AFFECT RESULT   Chloride 102 101 - 111 mmol/L   CO2 30 22 - 32 mmol/L   Glucose, Bld 154 (H) 65 - 99 mg/dL   BUN 27  (H) 6 - 20 mg/dL   Creatinine, Ser 1.42 (H) 0.61 - 1.24 mg/dL   Calcium 9.9 8.9 - 10.3 mg/dL   GFR calc non Af Amer 49 (L) >60 mL/min   GFR calc Af Amer 57 (L) >60 mL/min    Comment: (NOTE) The eGFR has been calculated using the CKD EPI equation. This calculation has not been validated in all clinical situations. eGFR's persistently <60 mL/min signify possible Chronic Kidney Disease.    Anion gap SEE COMMENTS 5 - 15    Comment: UNABLE TO REPORT DUE TO LIPEMIC INTERFERENCE  CBC     Status: Abnormal   Collection Time: 04/16/15 11:45 PM  Result Value Ref Range   WBC 7.0 3.8 - 10.6 K/uL   RBC 6.68 (H) 4.40 - 5.90 MIL/uL   Hemoglobin 13.3 13.0 - 18.0 g/dL   HCT 41.0 40.0 - 52.0 %   MCV 61.3 (L) 80.0 - 100.0 fL   MCH 19.9 (L) 26.0 - 34.0 pg   MCHC 32.5 32.0 - 36.0 g/dL   RDW 16.8 (H) 11.5 - 14.5 %   Platelets 116 (L) 150 - 440 K/uL    Comment: PLATELET COUNT CONFIRMED BY SMEAR  Troponin I     Status: Abnormal   Collection Time: 04/16/15 11:45 PM  Result Value Ref Range   Troponin I 0.06 (H) <0.031 ng/mL    Comment: READ BACK  AND VERIFIED WITH DAWN [facility] AT 0022 04/17/15.PMH        PERSISTENTLY INCREASED TROPONIN VALUES IN THE RANGE OF 0.04-0.49 ng/mL CAN BE SEEN IN:       -UNSTABLE ANGINA       -CONGESTIVE HEART FAILURE       -MYOCARDITIS       -CHEST TRAUMA       -ARRYHTHMIAS       -LATE PRESENTING MYOCARDIAL INFARCTION       -COPD   CLINICAL FOLLOW-UP RECOMMENDED.   Glucose, capillary     Status: Abnormal   Collection Time: 04/17/15  4:45 AM  Result Value Ref Range   Glucose-Capillary 173 (H) 65 - 99 mg/dL   Comment 1 Notify RN   CBC     Status: Abnormal   Collection Time: 04/17/15  5:17 AM  Result Value Ref Range   WBC 6.1 3.8 - 10.6 K/uL   RBC 6.13 (H) 4.40 - 5.90 MIL/uL   Hemoglobin 11.9 (L) 13.0 - 18.0 g/dL   HCT 37.4 (L) 40.0 - 52.0 %   MCV 61.0 (L) 80.0 - 100.0 fL   MCH 19.3 (L) 26.0 - 34.0 pg   MCHC 31.7 (L) 32.0 - 36.0 g/dL   RDW 16.4 (H) 11.5 - 14.5 %    Platelets 88 (L) 150 - 440 K/uL    Comment: PLATELET COUNT CONFIRMED BY SMEAR  Troponin I     Status: Abnormal   Collection Time: 04/17/15  5:17 AM  Result Value Ref Range   Troponin I 0.35 (H) <0.031 ng/mL    Comment: PREVIOUS RESULT CALLED AT 0022 04/17/15.PMH        PERSISTENTLY INCREASED TROPONIN VALUES IN THE RANGE OF 0.04-0.49 ng/mL CAN BE SEEN IN:       -UNSTABLE ANGINA       -CONGESTIVE HEART FAILURE       -MYOCARDITIS       -CHEST TRAUMA       -ARRYHTHMIAS       -LATE PRESENTING MYOCARDIAL INFARCTION       -COPD   CLINICAL FOLLOW-UP RECOMMENDED.   Basic metabolic panel     Status: Abnormal   Collection Time: 04/17/15  5:17 AM  Result Value Ref Range   Sodium 139 135 - 145 mmol/L   Potassium 5.1 3.5 - 5.1 mmol/L   Chloride 104 101 - 111 mmol/L   CO2 28 22 - 32 mmol/L   Glucose, Bld 179 (H) 65 - 99 mg/dL   BUN 29 (H) 6 - 20 mg/dL   Creatinine, Ser 1.32 (H) 0.61 - 1.24 mg/dL   Calcium 9.7 8.9 - 10.3 mg/dL   GFR calc non Af Amer 53 (L) >60 mL/min   GFR calc Af Amer >60 >60 mL/min    Comment: (NOTE) The eGFR has been calculated using the CKD EPI equation. This calculation has not been validated in all clinical situations. eGFR's persistently <60 mL/min signify possible Chronic Kidney Disease.    Anion gap 7 5 - 15  Hemoglobin A1c     Status: Abnormal   Collection Time: 04/17/15  5:17 AM  Result Value Ref Range   Hgb A1c MFr Bld 7.5 (H) 4.0 - 6.0 %  Troponin I     Status: Abnormal   Collection Time: 04/17/15 10:26 AM  Result Value Ref Range   Troponin I 0.89 (H) <0.031 ng/mL    Comment: READ BACK AND VERIFIED WITH SIMONE TOURE 04/17/15 1144 SGD  POSSIBLE MYOCARDIAL ISCHEMIA. SERIAL TESTING RECOMMENDED.   Glucose, capillary     Status: Abnormal   Collection Time: 04/17/15 11:30 AM  Result Value Ref Range   Glucose-Capillary 179 (H) 65 - 99 mg/dL    Dg Chest 2 View  04/17/2015  CLINICAL DATA:  Anterior chest pressure this morning. EXAM: CHEST  2 VIEW  COMPARISON:  02/04/2012 FINDINGS: The cardiomediastinal contours are normal. Mild interstitial thickening. Pulmonary vasculature is normal. No consolidation, pleural effusion, or pneumothorax. No acute osseous abnormalities are seen. IMPRESSION: Mild interstitial thickening, may reflect atypical infection. Electronically Signed   By: Jeb Levering M.D.   On: 04/17/2015 00:39    Review of Systems  Constitutional: Negative.   HENT: Negative.   Eyes: Negative.   Respiratory: Positive for shortness of breath.   Cardiovascular: Positive for chest pain.  Gastrointestinal: Negative.   Genitourinary: Negative.   Musculoskeletal: Negative.   Skin: Negative.   Neurological: Negative.   Endo/Heme/Allergies: Negative.   Psychiatric/Behavioral: Negative.    Blood pressure 138/75, pulse 63, temperature 97.5 F (36.4 C), temperature source Oral, resp. rate 16, height '5\' 10"'  (1.778 m), weight 93.849 kg (206 lb 14.4 oz), SpO2 97 %. Physical Exam  Constitutional: He is oriented to person, place, and time. He appears well-developed and well-nourished.  HENT:  Head: Atraumatic.  Eyes: Conjunctivae and EOM are normal. Pupils are equal, round, and reactive to light.  Neck: Normal range of motion. Neck supple.  Cardiovascular: Normal rate and regular rhythm.   Respiratory: Effort normal and breath sounds normal.  GI: Soft. Bowel sounds are normal.  Musculoskeletal: Normal range of motion.  Neurological: He is alert and oriented to person, place, and time. He has normal reflexes.  Skin: Skin is warm and dry.  Psychiatric: He has a normal mood and affect.    Assessment/Plan: Non-STEMI Unstable angina Elevated troponin Abnormal EKG Hypertension Diabetes Chronic renal insufficiency . PLAN Agree with admit to telemetry for evaluation Continue short-term anticoagulation Agree with aspirin therapy for 2 sclerotic vascular disease Diabetes management NovoLog Continue hypertension control with  losartan consider adding beta blocker Follow-up troponins and EKGs Consider echocardiogram for assessment of left ventricular function Recommend invasive strategy because of elevated troponin unstable anginal symptoms and abnormal EKG multiple risk factors Recommend cardiac cath tomorrow  Mollie Rossano D. 04/17/2015, 2:13 PM

## 2015-04-17 NOTE — Care Management Obs Status (Signed)
[facility] NOTIFICATION   Patient Details  Name: [name] MRN: [id number] Date of Birth: 04/22/1945   Medicare Observation Status Notification Given:  Yes    Katrina Stack, RN 04/17/2015, 1:43 PM

## 2015-04-17 NOTE — Progress Notes (Signed)
Patient resting in bed at this time, denies pain or discomfort VSS mood calm, NSR on the monitor. Pt  Cardiac craterization pamphlet given to patient, consent obtained , patient education about NPO after midnight. Denies any chest pain or discomfort at this time

## 2015-04-17 NOTE — ED Notes (Signed)
Pt returned from X-ray.  

## 2015-04-17 NOTE — H&P (Signed)
[facility] [place] NAME: [name]    MR#:  [id number]  DATE OF BIRTH:  May 06, 1945  DATE OF ADMISSION:  04/16/2015  PRIMARY CARE PHYSICIAN: Otilio Miu, MD   REQUESTING/REFERRING PHYSICIAN: Dahlia Client, M.D.  CHIEF COMPLAINT:   Chief Complaint  Patient presents with  . Chest Pain    HISTORY OF PRESENT ILLNESS:  [name]  is a 70 y.o. male who presents with angina. Patient has known known documented prior history of coronary artery disease, though he does have inferior Q waves on his EKG. He presents tonight after recurrent episodes of chest tightness today. He states that he was not exerting himself today, he was resting on his sofa when he began to have chest tightness across his chest. He states that this sensation waxed and waned throughout the day with no apparent relation to his level of activity. It is associated with some mild shortness of breath. It got worse around 6:00 this afternoon and he decided to come in to evaluated. He states that his chest pain did improve some little while after he was given a sublingual nitroglycerin. He was also given full-strength aspirin in the ED. Initial workup here is largely benign except for mildly elevated troponin at 0.06. Patient is diabetic and hypertensive and has a family history of heart disease including heart attack. Given his risk factors and his EKG hospitalists were called for admission.  PAST MEDICAL HISTORY:   Past Medical History  Diagnosis Date  . Diabetes mellitus without complication ([place])   . Hypertension     PAST SURGICAL HISTORY:   Past Surgical History  Procedure Laterality Date  . Hand surgery    . Knee surgery    . Colonoscopy  2014    cleared for 10 yrs    SOCIAL HISTORY:   Social History  Substance Use Topics  . Smoking status: Never Smoker   . Smokeless tobacco: Not on file  . Alcohol Use: No    FAMILY HISTORY:   Family History   Problem Relation Age of Onset  . Diabetes Mother   . Heart attack    . Hypertension      DRUG ALLERGIES:  No Known Allergies  MEDICATIONS AT HOME:   Prior to Admission medications   Medication Sig Start Date End Date Taking? Authorizing Provider  aspirin 81 MG tablet Take 81 mg by mouth daily.    Historical Provider, MD  glipiZIDE (GLUCOTROL) 5 MG tablet Take 1 tablet (5 mg total) by mouth 2 (two) times daily. 12/01/14   Juline Patch, MD  losartan (COZAAR) 50 MG tablet Take 1 tablet (50 mg total) by mouth daily. 12/01/14   Juline Patch, MD  metFORMIN (GLUCOPHAGE) 1000 MG tablet Take 1 tablet (1,000 mg total) by mouth 2 (two) times daily. 12/01/14   Juline Patch, MD    REVIEW OF SYSTEMS:  Review of Systems  Constitutional: Negative for fever, chills, weight loss and malaise/fatigue.  HENT: Negative for ear pain, hearing loss and tinnitus.   Eyes: Negative for blurred vision, double vision, pain and redness.  Respiratory: Positive for shortness of breath. Negative for cough and hemoptysis.   Cardiovascular: Positive for chest pain. Negative for palpitations, orthopnea and leg swelling.  Gastrointestinal: Negative for nausea, vomiting, abdominal pain, diarrhea and constipation.  Genitourinary: Negative for dysuria, frequency and hematuria.  Musculoskeletal: Negative for back pain, joint pain and neck pain.  Skin:  No acne, rash, or lesions  Neurological: Negative for dizziness, tremors, focal weakness and weakness.  Endo/Heme/Allergies: Negative for polydipsia. Does not bruise/bleed easily.  Psychiatric/Behavioral: Negative for depression. The patient is not nervous/anxious and does not have insomnia.      VITAL SIGNS:   Filed Vitals:   04/16/15 2340 04/17/15 0000  BP: 180/100 162/90  Pulse: 69 66  Temp: 97.8 F (36.6 C)   TempSrc: Oral   Resp: 18 12  Height: 5\' 10"  (1.778 m)   Weight: 92.534 kg (204 lb)   SpO2: 98% 98%   Wt Readings from Last 3 Encounters:   04/16/15 92.534 kg (204 lb)  12/01/14 94.348 kg (208 lb)    PHYSICAL EXAMINATION:  Physical Exam  Vitals reviewed. Constitutional: He is oriented to person, place, and time. He appears well-developed and well-nourished. No distress.  HENT:  Head: Normocephalic and atraumatic.  Mouth/Throat: Oropharynx is clear and moist.  Eyes: Conjunctivae and EOM are normal. Pupils are equal, round, and reactive to light. No scleral icterus.  Neck: Normal range of motion. Neck supple. No JVD present. No thyromegaly present.  Cardiovascular: Normal rate, regular rhythm and intact distal pulses.  Exam reveals no gallop and no friction rub.   No murmur heard. Respiratory: Effort normal and breath sounds normal. No respiratory distress. He has no wheezes. He has no rales.  GI: Soft. Bowel sounds are normal. He exhibits no distension. There is no tenderness.  Musculoskeletal: Normal range of motion. He exhibits no edema.  No arthritis, no gout  Lymphadenopathy:    He has no cervical adenopathy.  Neurological: He is alert and oriented to person, place, and time. No cranial nerve deficit.  No dysarthria, no aphasia  Skin: Skin is warm and dry. No rash noted. No erythema.  Psychiatric: He has a normal mood and affect. His behavior is normal. Judgment and thought content normal.    LABORATORY PANEL:   CBC  Recent Labs Lab 04/16/15 2345  WBC 7.0  HGB 13.3  HCT 41.0  PLT 116*   ------------------------------------------------------------------------------------------------------------------  Chemistries   Recent Labs Lab 04/16/15 2345  NA SEE COMMENTS  K 4.3  CL 102  CO2 30  GLUCOSE 154*  BUN 27*  CREATININE 1.42*  CALCIUM 9.9   ------------------------------------------------------------------------------------------------------------------  Cardiac Enzymes  Recent Labs Lab 04/16/15 2345  TROPONINI 0.06*    ------------------------------------------------------------------------------------------------------------------  RADIOLOGY:  Dg Chest 2 View  04/17/2015  CLINICAL DATA:  Anterior chest pressure this morning. EXAM: CHEST  2 VIEW COMPARISON:  02/04/2012 FINDINGS: The cardiomediastinal contours are normal. Mild interstitial thickening. Pulmonary vasculature is normal. No consolidation, pleural effusion, or pneumothorax. No acute osseous abnormalities are seen. IMPRESSION: Mild interstitial thickening, may reflect atypical infection. Electronically Signed   By: Jeb Levering M.D.   On: 04/17/2015 00:39    EKG:   Orders placed or performed during the hospital encounter of 04/16/15  . EKG 12-Lead  . EKG 12-Lead  . ED EKG within 10 minutes  . ED EKG within 10 minutes    IMPRESSION AND PLAN:  Principal Problem:   Angina pectoris ([place]) - patient is diabetic and hypertensive with family history of heart disease. No documented known prior heart disease himself. His EKG does show inferior Q waves. His angina has improved some with sublingual nitroglycerin. We will keep her on telemetry, cycle his enzymes, get a cardiology consult. Active Problems:   Elevated troponin - mildly elevated, indeterminate significance of this time. We will trend serially.   Type 2  diabetes mellitus ([facility]) - sliding scale insulin with corresponding glucose checks every 6 hours for now while nothing by mouth for possible cardiac procedure. Once he is taking by mouth again he will need a heart healthy/carb modified diet and sliding scale insulin with glucose checks before meals and at bedtime.   HTN (hypertension) - continue home meds  All the records are reviewed and case discussed with ED provider. Management plans discussed with the patient and/or family.  DVT PROPHYLAXIS: SubQ lovenox  GI PROPHYLAXIS: None  ADMISSION STATUS: Observation  CODE STATUS: Full  TOTAL TIME TAKING CARE OF THIS PATIENT: 40 minutes.     Moraima Burd [employer] 04/17/2015, 1:47 AM  [facility]  Office  [phone]  CC: Primary care physician; Otilio Miu, MD

## 2015-04-17 NOTE — ED Notes (Signed)
Dr. Dahlia Client notified of critical lab value - Troponin 0.06.  Acknowledged, no new orders.

## 2015-04-17 NOTE — ED Provider Notes (Signed)
[facility] Emergency Department Provider Note  ____________________________________________  Time seen: Approximately 12:01 AM  I have reviewed the triage vital signs and the nursing notes.   HISTORY  Chief Complaint Chest Pain    HPI [name] is a 70 y.o. male who comes into the hospital today with some chest pain. The patient reports that he has had chest pressure across his chest for the entire day. He reports that it's in the middle and to the right side. He reports that the symptoms worsened around 6 PM but then eased off. When he started getting ready for bed he noticed that he still had the symptoms and thought he should get it checked out and not just go to sleep so he decided to come in for evaluation. The patient denies any radiation of the pain no shortness of breath no sweats no nausea or vomiting. He has not taken anything for pain. He has not had symptoms like this before. Rates that his symptoms are 1-2 out of 10 in intensity. He denies any increased strenuous activity although he says that yesterday he did not know off of his car. Patient is here for evaluation.   Past Medical History  Diagnosis Date  . Diabetes mellitus without complication ([place])   . Hypertension     Patient Active Problem List   Diagnosis Date Noted  . Angina pectoris ([place]) 04/17/2015  . Elevated troponin 04/17/2015  . Type 2 diabetes mellitus ([place]) 04/17/2015  . HTN (hypertension) 04/17/2015    Past Surgical History  Procedure Laterality Date  . Hand surgery    . Knee surgery    . Colonoscopy  2014    cleared for 10 yrs    No current outpatient prescriptions on file.  Allergies Review of patient's allergies indicates no known allergies.  Family History  Problem Relation Age of Onset  . Diabetes Mother   . Heart attack    . Hypertension      Social History Social History  Substance Use Topics  . Smoking status: Never Smoker   . Smokeless  tobacco: None  . Alcohol Use: No    Review of Systems Constitutional: No fever/chills Eyes: No visual changes. ENT: No sore throat. Cardiovascular:  chest pain. Respiratory: Denies shortness of breath. Gastrointestinal: No abdominal pain.  No nausea, no vomiting.  No diarrhea.  No constipation. Genitourinary: Negative for dysuria. Musculoskeletal: Negative for back pain. Skin: Negative for rash. Neurological: Negative for headaches, focal weakness or numbness.  10-point ROS otherwise negative.  ____________________________________________   PHYSICAL EXAM:  VITAL SIGNS: ED Triage Vitals  Enc Vitals Group     BP 04/16/15 2340 180/100 mmHg     Pulse Rate 04/16/15 2340 69     Resp 04/16/15 2340 18     Temp 04/16/15 2340 97.8 F (36.6 C)     Temp Source 04/16/15 2340 Oral     SpO2 04/16/15 2340 98 %     Weight 04/16/15 2340 204 lb (92.534 kg)     Height 04/16/15 2340 5\' 10"  (1.778 m)     Head Cir --      Peak Flow --      Pain Score 04/16/15 2343 3     Pain Loc --      Pain Edu? --      Excl. in [place]? --     Constitutional: Alert and oriented. Well appearing and in mild distress. Eyes: Conjunctivae are normal. PERRL. EOMI. Head: Atraumatic. Nose: No  congestion/rhinnorhea. Mouth/Throat: Mucous membranes are moist.  Oropharynx non-erythematous. Cardiovascular: Normal rate, regular rhythm. Grossly normal heart sounds.  Good peripheral circulation. Respiratory: Normal respiratory effort.  No retractions. Lungs CTAB. Gastrointestinal: Soft and nontender. No distention. Positive bowel sounds. Musculoskeletal: No lower extremity tenderness nor edema.   Neurologic:  Normal speech and language.  Skin:  Skin is warm, dry and intact.  Psychiatric: Mood and affect are normal.   ____________________________________________   LABS (all labs ordered are listed, but only abnormal results are displayed)  Labs Reviewed  BASIC METABOLIC PANEL - Abnormal; Notable for the following:     Glucose, Bld 154 (*)    BUN 27 (*)    Creatinine, Ser 1.42 (*)    GFR calc non Af Amer 49 (*)    GFR calc Af Amer 57 (*)    All other components within normal limits  CBC - Abnormal; Notable for the following:    RBC 6.68 (*)    MCV 61.3 (*)    MCH 19.9 (*)    RDW 16.8 (*)    Platelets 116 (*)    All other components within normal limits  TROPONIN I - Abnormal; Notable for the following:    Troponin I 0.06 (*)    All other components within normal limits  CBC - Abnormal; Notable for the following:    RBC 6.13 (*)    Hemoglobin 11.9 (*)    HCT 37.4 (*)    MCV 61.0 (*)    MCH 19.3 (*)    MCHC 31.7 (*)    RDW 16.4 (*)    Platelets 88 (*)    All other components within normal limits  TROPONIN I - Abnormal; Notable for the following:    Troponin I 0.35 (*)    All other components within normal limits  BASIC METABOLIC PANEL - Abnormal; Notable for the following:    Glucose, Bld 179 (*)    BUN 29 (*)    Creatinine, Ser 1.32 (*)    GFR calc non Af Amer 53 (*)    All other components within normal limits  GLUCOSE, CAPILLARY - Abnormal; Notable for the following:    Glucose-Capillary 173 (*)    All other components within normal limits  TROPONIN I  HEMOGLOBIN A1C   ____________________________________________  EKG  ED ECG REPORT I, Loney Hering, the attending physician, personally viewed and interpreted this ECG.   Date: 04/16/2015  EKG Time: 2341  Rate: 67  Rhythm: normal sinus rhythm  Axis: normal  Intervals:none  ST&T Change: none  ____________________________________________  RADIOLOGY  Chest x-ray: Mild interstitial thickening, may reflect atypical infection. ____________________________________________   PROCEDURES  Procedure(s) performed: None  Critical Care performed: No  ____________________________________________   INITIAL IMPRESSION / ASSESSMENT AND PLAN / ED COURSE  Pertinent labs & imaging results that were available during my  care of the patient were reviewed by me and considered in my medical decision making (see chart for details).  This is a 70 year old male who comes into the hospital today with some chest pressure and tightness across the top of his chest. The patient's pain is not severe at this time but we will do some blood work as well as a chest x-ray. I will give the patient some aspirin and a nitroglycerin and I will reassess the patient.  The patient's pain is improved at this time but his troponin is mildly elevated so I will admit him to the hospitalist service. The patient has no  further complaints or concerns and no worsening. At this time. I will hold off on a heparin drip given the mild elevation of his troponin and the improvement of his symptoms. ____________________________________________   FINAL CLINICAL IMPRESSION(S) / ED DIAGNOSES  Final diagnoses:  Chest pain, unspecified chest pain type      Loney Hering, MD 04/17/15 [phone]

## 2015-04-17 NOTE — Progress Notes (Signed)
*  PRELIMINARY RESULTS* Echocardiogram 2D Echocardiogram has been performed.  [name] 04/17/2015, 10:58 AM

## 2015-04-17 NOTE — ED Notes (Signed)
Pt to Xray in no acute distress

## 2015-04-17 NOTE — Progress Notes (Signed)
Pt insulin and CBG checks are not lined up, called to get them scheduled together. First one starting at midnight since pt just got checked at 1800 with coverage and pt will be NPO at midnight. Still scheduled q6hr. Will continue to monitor. Conley Simmonds, RN

## 2015-04-17 NOTE — Progress Notes (Signed)
[place] [facility] [place] NAME: [name]    MR#:  [id number]  DATE OF BIRTH:  1946-02-18  SUBJECTIVE:  CHIEF COMPLAINT:   Chief Complaint  Patient presents with  . Chest Pain   Patient presents with chest pains across the chest, no pain at present, troponin is mildly elevated at 0.89, cardiologist recommends cardiac catheterization tomorrow Review of Systems  Constitutional: Negative for fever, chills and weight loss.  HENT: Negative for congestion.   Eyes: Negative for blurred vision and double vision.  Respiratory: Negative for cough, sputum production, shortness of breath and wheezing.   Cardiovascular: Negative for chest pain, palpitations, orthopnea, leg swelling and PND.  Gastrointestinal: Negative for nausea, vomiting, abdominal pain, diarrhea, constipation and blood in stool.  Genitourinary: Negative for dysuria, urgency, frequency and hematuria.  Musculoskeletal: Negative for falls.  Neurological: Negative for dizziness, tremors, focal weakness and headaches.  Endo/Heme/Allergies: Does not bruise/bleed easily.  Psychiatric/Behavioral: Negative for depression. The patient does not have insomnia.     VITAL SIGNS: Blood pressure 138/75, pulse 63, temperature 97.5 F (36.4 C), temperature source Oral, resp. rate 16, height 5\' 10"  (1.778 m), weight 93.849 kg (206 lb 14.4 oz), SpO2 97 %.  PHYSICAL EXAMINATION:   GENERAL:  70 y.o.-year-old patient lying in the bed with no acute distress.  EYES: Pupils equal, round, reactive to light and accommodation. No scleral icterus. Extraocular muscles intact.  HEENT: Head atraumatic, normocephalic. Oropharynx and nasopharynx clear.  NECK:  Supple, no jugular venous distention. No thyroid enlargement, no tenderness.  LUNGS: Normal breath sounds bilaterally, no wheezing, rales,rhonchi or crepitation. No use of accessory muscles of respiration.  CARDIOVASCULAR: S1, S2 normal. No murmurs,  rubs, or gallops.  ABDOMEN: Soft, nontender, nondistended. Bowel sounds present. No organomegaly or mass.  EXTREMITIES: No pedal edema, cyanosis, or clubbing.  NEUROLOGIC: Cranial nerves II through XII are intact. Muscle strength 5/5 in all extremities. Sensation intact. Gait not checked.  PSYCHIATRIC: The patient is alert and oriented x 3.  SKIN: No obvious rash, lesion, or ulcer.   ORDERS/RESULTS REVIEWED:   CBC  Recent Labs Lab 04/16/15 2345 04/17/15 0517  WBC 7.0 6.1  HGB 13.3 11.9*  HCT 41.0 37.4*  PLT 116* 88*  MCV 61.3* 61.0*  MCH 19.9* 19.3*  MCHC 32.5 31.7*  RDW 16.8* 16.4*   ------------------------------------------------------------------------------------------------------------------  Chemistries   Recent Labs Lab 04/16/15 2345 04/17/15 0517  NA SEE COMMENTS 139  K 4.3 5.1  CL 102 104  CO2 30 28  GLUCOSE 154* 179*  BUN 27* 29*  CREATININE 1.42* 1.32*  CALCIUM 9.9 9.7   ------------------------------------------------------------------------------------------------------------------ estimated creatinine clearance is 60.7 mL/min (by C-G formula based on Cr of 1.32). ------------------------------------------------------------------------------------------------------------------ No results for input(s): TSH, T4TOTAL, T3FREE, THYROIDAB in the last 72 hours.  Invalid input(s): FREET3  Cardiac Enzymes  Recent Labs Lab 04/16/15 2345 04/17/15 0517 04/17/15 1026  TROPONINI 0.06* 0.35* 0.89*   ------------------------------------------------------------------------------------------------------------------ Invalid input(s): POCBNP ---------------------------------------------------------------------------------------------------------------  RADIOLOGY: Dg Chest 2 View  04/17/2015  CLINICAL DATA:  Anterior chest pressure this morning. EXAM: CHEST  2 VIEW COMPARISON:  02/04/2012 FINDINGS: The cardiomediastinal contours are normal. Mild interstitial  thickening. Pulmonary vasculature is normal. No consolidation, pleural effusion, or pneumothorax. No acute osseous abnormalities are seen. IMPRESSION: Mild interstitial thickening, may reflect atypical infection. Electronically Signed   By: Jeb Levering M.D.   On: 04/17/2015 00:39    EKG:  Orders placed or performed during the hospital encounter of 04/16/15  .  EKG 12-Lead  . EKG 12-Lead  . ED EKG within 10 minutes  . ED EKG within 10 minutes    ASSESSMENT AND PLAN:  Principal Problem:   Angina pectoris ([place]) Active Problems:   Elevated troponin   Type 2 diabetes mellitus ([facility])   HTN (hypertension)  #1. Unstable angina, continue patient on aspirin, nitroglycerin, Lovenox, cardiac enzymes are checked and found mildly elevated, patient was consulted by cardiologist who recommended cardiac catheterization tomorrow, January 10 #2. Renal insufficiency, chronic stable, continue IV fluids. Procedure #3. Diabetes mellitus type 2. Hemoglobin A1c 7.5, holding metformin due to renal insufficiency, may need to initiate low-dose of glipizide #4. Essential hypertension, relatively well controlled, continue outpatient therapy, provided nitroglycerin   Management plans discussed with the patient, family and they are in agreement.   DRUG ALLERGIES: No Known Allergies  CODE STATUS:     Code Status Orders        Start     Ordered   04/17/15 0438  Full code   Continuous     04/17/15 0437      TOTAL TIME TAKING CARE OF THIS PATIENT: 35 minutes.    Theodoro Grist M.D on 04/17/2015 at 4:34 PM  Between 7am to 6pm - Pager - [phone]  After 6pm go to [email or web address] - password [email or web address] [facility]  [place] [facility]  Office  [phone]  CC: Primary care physician; Otilio Miu, MD

## 2015-04-18 ENCOUNTER — Encounter: Payer: Self-pay | Admitting: Internal Medicine

## 2015-04-18 ENCOUNTER — Encounter
Admission: EM | Disposition: A | Discharge status: Other Acute Inpatient Hospital | Payer: Self-pay | Source: Home / Self Care | Attending: Emergency Medicine

## 2015-04-18 DIAGNOSIS — I2510 Atherosclerotic heart disease of native coronary artery without angina pectoris: Secondary | ICD-10-CM

## 2015-04-18 DIAGNOSIS — E119 Type 2 diabetes mellitus without complications: Secondary | ICD-10-CM | POA: Diagnosis not present

## 2015-04-18 DIAGNOSIS — I10 Essential (primary) hypertension: Secondary | ICD-10-CM | POA: Diagnosis not present

## 2015-04-18 DIAGNOSIS — I25110 Atherosclerotic heart disease of native coronary artery with unstable angina pectoris: Secondary | ICD-10-CM | POA: Diagnosis not present

## 2015-04-18 DIAGNOSIS — I200 Unstable angina: Secondary | ICD-10-CM | POA: Diagnosis not present

## 2015-04-18 DIAGNOSIS — N289 Disorder of kidney and ureter, unspecified: Secondary | ICD-10-CM | POA: Diagnosis not present

## 2015-04-18 HISTORY — DX: Atherosclerotic heart disease of native coronary artery without angina pectoris: I25.10

## 2015-04-18 HISTORY — PX: CARDIAC CATHETERIZATION: SHX172

## 2015-04-18 LAB — CBC
HEMATOCRIT: 37 % — AB (ref 40.0–52.0)
HEMOGLOBIN: 11.9 g/dL — AB (ref 13.0–18.0)
MCH: 19.6 pg — ABNORMAL LOW (ref 26.0–34.0)
MCHC: 32.3 g/dL (ref 32.0–36.0)
MCV: 60.9 fL — AB (ref 80.0–100.0)
Platelets: 84 10*3/uL — ABNORMAL LOW (ref 150–440)
RBC: 6.08 MIL/uL — ABNORMAL HIGH (ref 4.40–5.90)
RDW: 16.6 % — AB (ref 11.5–14.5)
WBC: 6.2 10*3/uL (ref 3.8–10.6)

## 2015-04-18 LAB — GLUCOSE, CAPILLARY
GLUCOSE-CAPILLARY: 177 mg/dL — AB (ref 65–99)
GLUCOSE-CAPILLARY: 182 mg/dL — AB (ref 65–99)
GLUCOSE-CAPILLARY: 203 mg/dL — AB (ref 65–99)
GLUCOSE-CAPILLARY: 276 mg/dL — AB (ref 65–99)
Glucose-Capillary: 206 mg/dL — ABNORMAL HIGH (ref 65–99)

## 2015-04-18 SURGERY — LEFT HEART CATH AND CORONARY ANGIOGRAPHY
Anesthesia: Moderate Sedation

## 2015-04-18 MED ORDER — ONDANSETRON HCL 4 MG/2ML IJ SOLN: 4.0000 mg | Freq: Four times a day (QID) | INTRAMUSCULAR | Status: DC | PRN

## 2015-04-18 MED ORDER — ASPIRIN 81 MG PO CHEW
81.0000 mg | CHEWABLE_TABLET | Freq: Every day | ORAL | Status: DC
Start: 2015-04-18 — End: 2015-04-19
  Administered 2015-04-18: 81 mg via ORAL
  Filled 2015-04-18 (×2): qty 1

## 2015-04-18 MED ORDER — SODIUM CHLORIDE 0.9 % WEIGHT BASED INFUSION: 3.0000 mL/kg/h | INTRAVENOUS | Status: AC

## 2015-04-18 MED ORDER — ISOSORBIDE MONONITRATE ER 30 MG PO TB24: 30.0000 mg | ORAL_TABLET | Freq: Every day | ORAL | Status: DC

## 2015-04-18 MED ORDER — ACETAMINOPHEN 325 MG PO TABS
650.0000 mg | ORAL_TABLET | ORAL | Status: DC | PRN
Start: 2015-04-18 — End: 2015-04-18

## 2015-04-18 MED ORDER — ATORVASTATIN CALCIUM 40 MG PO TABS: 40.0000 mg | ORAL_TABLET | Freq: Every day | ORAL | Status: DC

## 2015-04-18 MED ORDER — IOHEXOL 300 MG/ML  SOLN
INTRAMUSCULAR | Status: DC | PRN
  Administered 2015-04-18: 130 mL via INTRA_ARTERIAL

## 2015-04-18 MED ORDER — MIDAZOLAM HCL 2 MG/2ML IJ SOLN
INTRAMUSCULAR | Status: DC | PRN
  Administered 2015-04-18: 1 mg via INTRAVENOUS

## 2015-04-18 MED ORDER — SODIUM CHLORIDE 0.9 % IJ SOLN: 3.0000 mL | INTRAMUSCULAR | Status: DC | PRN

## 2015-04-18 MED ORDER — FENTANYL CITRATE (PF) 100 MCG/2ML IJ SOLN
INTRAMUSCULAR | Status: AC
  Filled 2015-04-18: qty 2

## 2015-04-18 MED ORDER — HEPARIN (PORCINE) IN NACL 2-0.9 UNIT/ML-% IJ SOLN
INTRAMUSCULAR | Status: AC
  Filled 2015-04-18: qty 1000

## 2015-04-18 MED ORDER — METOPROLOL TARTRATE 25 MG PO TABS
25.0000 mg | ORAL_TABLET | Freq: Two times a day (BID) | ORAL | Status: DC
  Administered 2015-04-18: 25 mg via ORAL
  Filled 2015-04-18: qty 1

## 2015-04-18 MED ORDER — MIDAZOLAM HCL 2 MG/2ML IJ SOLN
INTRAMUSCULAR | Status: AC
  Filled 2015-04-18: qty 2

## 2015-04-18 MED ORDER — SODIUM CHLORIDE 0.9 % IJ SOLN
3.0000 mL | Freq: Two times a day (BID) | INTRAMUSCULAR | Status: DC
  Administered 2015-04-18: 3 mL via INTRAVENOUS

## 2015-04-18 MED ORDER — FENTANYL CITRATE (PF) 100 MCG/2ML IJ SOLN
INTRAMUSCULAR | Status: DC | PRN
  Administered 2015-04-18: 25 ug via INTRAVENOUS

## 2015-04-18 MED ORDER — ISOSORBIDE MONONITRATE ER 30 MG PO TB24
30.0000 mg | ORAL_TABLET | Freq: Every day | ORAL | Status: DC
  Administered 2015-04-18: 30 mg via ORAL
  Filled 2015-04-18: qty 1

## 2015-04-18 MED ORDER — METOPROLOL TARTRATE 25 MG PO TABS: 25.0000 mg | ORAL_TABLET | Freq: Two times a day (BID) | ORAL | Status: DC

## 2015-04-18 MED ORDER — SODIUM CHLORIDE 0.9 % IV SOLN: 250.0000 mL | INTRAVENOUS | Status: DC | PRN

## 2015-04-18 SURGICAL SUPPLY — 9 items
CATH INFINITI 5FR ANG PIGTAIL (CATHETERS) ×3 IMPLANT
CATH INFINITI 5FR JL4 (CATHETERS) ×3 IMPLANT
CATH INFINITI JR4 5F (CATHETERS) ×3 IMPLANT
DEVICE CLOSURE MYNXGRIP 5F (Vascular Products) ×3 IMPLANT
KIT MANI 3VAL PERCEP (MISCELLANEOUS) ×3 IMPLANT
NEEDLE PERC 18GX7CM (NEEDLE) ×3 IMPLANT
PACK CARDIAC CATH (CUSTOM PROCEDURE TRAY) ×3 IMPLANT
SHEATH AVANTI 5FR X 11CM (SHEATH) ×3 IMPLANT
WIRE EMERALD 3MM-J .035X150CM (WIRE) ×3 IMPLANT

## 2015-04-18 NOTE — Progress Notes (Signed)
Inpatient Diabetes Program Recommendations  AACE/ADA: New Consensus Statement on Inpatient Glycemic Control (2015)  Target Ranges:  Prepandial:   less than 140 mg/dL      Peak postprandial:   less than 180 mg/dL (1-2 hours)      Critically ill patients:  140 - 180 mg/dL    Results for JAHZEEL, HOGAN (MRN [id number]) as of 04/18/2015 11:44  Ref. Range 04/17/2015 04:45 04/17/2015 11:30 04/17/2015 18:00 04/18/2015 00:02 04/18/2015 05:44  Glucose-Capillary Latest Ref Range: 65-99 mg/dL 173 (H) 179 (H) 277 (H) 276 (H) 206 (H)    Admit CP  History: DM, HTN  Home DM Meds: Glipizide 5 mg bid       Metformin 500 mg bid  Current Insulin Orders: Novolog Sensitive SSI (0-9 units) Q6 hours     -Note patient underwent Cardiac Cath today.  -Back on solid PO diet after cath today.    MD- Please change Novolog SSI to Moderate scale (0-15 units) TID AC + HS (currently ordered as Sensitive scale Q6 hours)     --Will follow patient during hospitalization--  Wyn Quaker RN, MSN, CDE Diabetes Coordinator Inpatient Glycemic Control Team Team Pager: [phone] (8a-5p)

## 2015-04-18 NOTE — Care Management (Signed)
Patient to transfer to [facility] for CABG consideration

## 2015-04-18 NOTE — Progress Notes (Signed)
Report to [facility] telemetry  Check right groin for bleeding or hematoma.  Patient will be on bedrest for 1 hours post sheath pull---out of bed at109:25.  Bilateral pulses are 2's DP's.Marland Kitchen

## 2015-04-18 NOTE — Discharge Summary (Signed)
[facility] [place] NAME: Storm Brandsma    MR#:  [id number]  DATE OF BIRTH:  Oct 01, 1945  DATE OF ADMISSION:  04/16/2015 ADMITTING PHYSICIAN: Lance Coon, MD  DATE OF DISCHARGE: No discharge date for patient encounter.  PRIMARY CARE PHYSICIAN: Otilio Miu, MD     ADMISSION DIAGNOSIS:  Chest pain, unspecified chest pain type [R07.9]  DISCHARGE DIAGNOSIS:  Principal Problem:   Unstable angina (HCC) Active Problems:   Elevated troponin   Coronary artery disease   Type 2 diabetes mellitus (HCC)   HTN (hypertension)   SECONDARY DIAGNOSIS:   Past Medical History  Diagnosis Date  . Diabetes mellitus without complication ([place])   . Hypertension     .pro HOSPITAL COURSE:    The patient is a 70 year old Caucasian male with history of diabetes mellitus, hypertension who presents to the hospital with complaints of chest pressure, lasting few hours. On arrival to the hospital patient was noted to have elevated troponin to maximum level of 0.89. He was initiated on beta blockers, aspirin, nitroglycerin and heparin and cardiac consultation was obtained. Cardiologist felt that patient would benefit from cardiac catheterization which was performed on 04/18/2015. Cardiac catheterization results:  Dist Cx lesion, 80% stenosed. The lesion was not previously treated.  2nd Mrg lesion, 99% stenosed. The lesion was not previously treated.  1st Diag-1 lesion, 70% stenosed. The lesion was not previously treated.  1st Diag-2 lesion, 99% stenosed. The lesion was not previously treated.  Mid LAD lesion, 60% stenosed. The lesion was not previously treated.  Mid LAD to Dist LAD lesion, 70% stenosed. The lesion was not previously treated.  Dist LAD lesion, 75% stenosed. The lesion was not previously treated.  Prox RCA lesion, 100% stenosed. The lesion was not previously treated.  normal overall left ventricular function ejection fraction of  55% suggest coronary bypass surgery for multivessel coronary disease Discussion by problem: #1. Unstable angina is mild elevation of troponin, likely non-Q-wave MI, continue patient on aspirin, Imdur, metoprolol, losartan, patient will need a cardiac surgery evaluation for possible cardiac bypass surgery, Dr. Clayborn Bigness is attempting to transfer patient to [facility] for immediate evaluation. Patient is going to be ambulated and if he has no chest pains, he is going to be discharged home with follow-up with Dr. Clayborn Bigness and setting up an appointment with cardiothoracic surgery as outpatient  #2. Renal insufficiency, chronic stable,, the patient was on IV fluids peri-procedure  #3. Diabetes mellitus type 2. Hemoglobin A1c 7.5,discontinued metformin  due to renal insufficiency,initiated  low-dose of glipizide, follow-up with primary care physician as outpatient and advanced medications as needed  #4. Essential hypertension, now on the metoprolol, Imdur, losartan , advance as needed  DISCHARGE CONDITIONS:   Stable  CONSULTS OBTAINED:  Treatment Team:  Yolonda Kida, MD  DRUG ALLERGIES:  No Known Allergies  DISCHARGE MEDICATIONS:   Current Discharge Medication List    START taking these medications   Details  atorvastatin (LIPITOR) 40 MG tablet Take 1 tablet (40 mg total) by mouth daily. Qty: 30 tablet, Refills: 6    isosorbide mononitrate (IMDUR) 30 MG 24 hr tablet Take 1 tablet (30 mg total) by mouth daily. Qty: 30 tablet, Refills: 6    metoprolol tartrate (LOPRESSOR) 25 MG tablet Take 1 tablet (25 mg total) by mouth 2 (two) times daily. Qty: 60 tablet, Refills: 6      CONTINUE these medications which have NOT CHANGED   Details  aspirin 81  MG tablet Take 81 mg by mouth daily.    glipiZIDE (GLUCOTROL) 5 MG tablet Take 1 tablet (5 mg total) by mouth 2 (two) times daily. Qty: 180 tablet, Refills: 2   Associated Diagnoses: Type 2 diabetes mellitus without complication  (HCC)    losartan (COZAAR) 50 MG tablet Take 1 tablet (50 mg total) by mouth daily. Qty: 90 tablet, Refills: 2   Associated Diagnoses: Essential hypertension      STOP taking these medications     metFORMIN (GLUCOPHAGE) 1000 MG tablet          DISCHARGE INSTRUCTIONS:    Patient is to follow-up with primary care physician, cardiologist, cardiothoracic surgeon  If you experience worsening of your admission symptoms, develop shortness of breath, life threatening emergency, suicidal or homicidal thoughts you must seek medical attention immediately by calling 911 or calling your MD immediately  if symptoms less severe.  You Must read complete instructions/literature along with all the possible adverse reactions/side effects for all the Medicines you take and that have been prescribed to you. Take any new Medicines after you have completely understood and accept all the possible adverse reactions/side effects.   Please note  You were cared for by a hospitalist during your hospital stay. If you have any questions about your discharge medications or the care you received while you were in the hospital after you are discharged, you can call the unit and asked to speak with the hospitalist on call if the hospitalist that took care of you is not available. Once you are discharged, your primary care physician will handle any further medical issues. Please note that NO REFILLS for any discharge medications will be authorized once you are discharged, as it is imperative that you return to your primary care physician (or establish a relationship with a primary care physician if you do not have one) for your aftercare needs so that they can reassess your need for medications and monitor your lab values.    Today   CHIEF COMPLAINT:   Chief Complaint  Patient presents with  . Chest Pain    HISTORY OF PRESENT ILLNESS:   Vanden Hayter  is a 70 y.o. male with a known history of diabetes  mellitus, hypertension who presents to the hospital with complaints of chest pressure, lasting few hours. On arrival to the hospital patient was noted to have elevated troponin to maximum level of 0.89. He was initiated on beta blockers, aspirin, nitroglycerin and heparin and cardiac consultation was obtained. Cardiologist felt that patient would benefit from cardiac catheterization which was performed on 04/18/2015. Cardiac catheterization results:  Dist Cx lesion, 80% stenosed. The lesion was not previously treated.  2nd Mrg lesion, 99% stenosed. The lesion was not previously treated.  1st Diag-1 lesion, 70% stenosed. The lesion was not previously treated.  1st Diag-2 lesion, 99% stenosed. The lesion was not previously treated.  Mid LAD lesion, 60% stenosed. The lesion was not previously treated.  Mid LAD to Dist LAD lesion, 70% stenosed. The lesion was not previously treated.  Dist LAD lesion, 75% stenosed. The lesion was not previously treated.  Prox RCA lesion, 100% stenosed. The lesion was not previously treated.  normal overall left ventricular function ejection fraction of 55% suggest coronary bypass surgery for multivessel coronary disease Discussion by problem: #1. Unstable angina is mild elevation of troponin, likely non-Q-wave MI, continue patient on aspirin, Imdur, metoprolol, losartan, patient will need a cardiac surgery evaluation for possible cardiac bypass surgery, Dr. Clayborn Bigness is  attempting to transfer patient to [facility] for immediate evaluation. Patient is going to be ambulated and if he has no chest pains, he is going to be discharged home with follow-up with Dr. Clayborn Bigness and setting up an appointment with cardiothoracic surgery as outpatient  #2. Renal insufficiency, chronic stable,, the patient was on IV fluids peri-procedure  #3. Diabetes mellitus type 2. Hemoglobin A1c 7.5,discontinued metformin  due to renal insufficiency,initiated  low-dose of glipizide,  follow-up with primary care physician as outpatient and advanced medications as needed  #4. Essential hypertension, now on the metoprolol, Imdur, losartan , advance as needed   VITAL SIGNS:  Blood pressure 125/71, pulse 73, temperature 98 F (36.7 C), temperature source Oral, resp. rate 18, height 5\' 10"  (1.778 m), weight 93.396 kg (205 lb 14.4 oz), SpO2 97 %.  I/O:   Intake/Output Summary (Last 24 hours) at 04/18/15 1425 Last data filed at 04/18/15 1413  Gross per 24 hour  Intake    123 ml  Output    450 ml  Net   -327 ml    PHYSICAL EXAMINATION:  GENERAL:  70 y.o.-year-old patient lying in the bed with no acute distress.  EYES: Pupils equal, round, reactive to light and accommodation. No scleral icterus. Extraocular muscles intact.  HEENT: Head atraumatic, normocephalic. Oropharynx and nasopharynx clear.  NECK:  Supple, no jugular venous distention. No thyroid enlargement, no tenderness.  LUNGS: Normal breath sounds bilaterally, no wheezing, rales,rhonchi or crepitation. No use of accessory muscles of respiration.  CARDIOVASCULAR: S1, S2 normal. No murmurs, rubs, or gallops.  ABDOMEN: Soft, non-tender, non-distended. Bowel sounds present. No organomegaly or mass.  EXTREMITIES: No pedal edema, cyanosis, or clubbing.  NEUROLOGIC: Cranial nerves II through XII are intact. Muscle strength 5/5 in all extremities. Sensation intact. Gait not checked.  PSYCHIATRIC: The patient is alert and oriented x 3.  SKIN: No obvious rash, lesion, or ulcer.   DATA REVIEW:   CBC  Recent Labs Lab 04/18/15 0532  WBC 6.2  HGB 11.9*  HCT 37.0*  PLT 84*    Chemistries   Recent Labs Lab 04/17/15 0517  NA 139  K 5.1  CL 104  CO2 28  GLUCOSE 179*  BUN 29*  CREATININE 1.32*  CALCIUM 9.7    Cardiac Enzymes  Recent Labs Lab 04/17/15 1026  TROPONINI 0.89*    Microbiology Results  No results found for this or any previous visit.  RADIOLOGY:  Dg Chest 2 View  04/17/2015  CLINICAL  DATA:  Anterior chest pressure this morning. EXAM: CHEST  2 VIEW COMPARISON:  02/04/2012 FINDINGS: The cardiomediastinal contours are normal. Mild interstitial thickening. Pulmonary vasculature is normal. No consolidation, pleural effusion, or pneumothorax. No acute osseous abnormalities are seen. IMPRESSION: Mild interstitial thickening, may reflect atypical infection. Electronically Signed   By: Jeb Levering M.D.   On: 04/17/2015 00:39    EKG:   Orders placed or performed during the hospital encounter of 04/16/15  . EKG 12-Lead  . EKG 12-Lead  . ED EKG within 10 minutes  . ED EKG within 10 minutes      Management plans discussed with the patient, family and they are in agreement.  CODE STATUS:     Code Status Orders        Start     Ordered   04/18/15 0957  Full code   Continuous     04/18/15 0957    Code Status History    Date Active Date Inactive Code Status Order  ID Comments User Context   04/17/2015  4:37 AM 04/18/2015  9:57 AM Full Code [id number]  Lance Coon, MD Inpatient      TOTAL TIME TAKING CARE OF THIS PATIENT: 40  minutes.  Discussed with Dr. Forde Radon M.D on 04/18/2015 at 2:25 PM  Between 7am to 6pm - Pager - [phone]  After 6pm go to [email or web address] - password [email or web address] [facility]  [facility]  Office  [phone]  CC: Primary care physician; Otilio Miu, MD

## 2015-04-19 ENCOUNTER — Other Ambulatory Visit: Payer: Self-pay | Admitting: *Deleted

## 2015-04-19 ENCOUNTER — Inpatient Hospital Stay (HOSPITAL_COMMUNITY)
Admission: EM | Admit: 2015-04-19 | Discharge: 2015-04-26 | DRG: 235 | Disposition: A | Discharge status: Home / Self Care | Payer: Medicare Other | Source: Other Acute Inpatient Hospital | Attending: Cardiothoracic Surgery | Admitting: Cardiothoracic Surgery

## 2015-04-19 ENCOUNTER — Encounter (HOSPITAL_COMMUNITY): Payer: Self-pay | Admitting: General Practice

## 2015-04-19 DIAGNOSIS — E1122 Type 2 diabetes mellitus with diabetic chronic kidney disease: Secondary | ICD-10-CM | POA: Diagnosis present

## 2015-04-19 DIAGNOSIS — I129 Hypertensive chronic kidney disease with stage 1 through stage 4 chronic kidney disease, or unspecified chronic kidney disease: Secondary | ICD-10-CM | POA: Diagnosis present

## 2015-04-19 DIAGNOSIS — J9811 Atelectasis: Secondary | ICD-10-CM | POA: Diagnosis not present

## 2015-04-19 DIAGNOSIS — N289 Disorder of kidney and ureter, unspecified: Secondary | ICD-10-CM | POA: Diagnosis not present

## 2015-04-19 DIAGNOSIS — Z951 Presence of aortocoronary bypass graft: Secondary | ICD-10-CM

## 2015-04-19 DIAGNOSIS — I2510 Atherosclerotic heart disease of native coronary artery without angina pectoris: Secondary | ICD-10-CM | POA: Diagnosis present

## 2015-04-19 DIAGNOSIS — I4891 Unspecified atrial fibrillation: Secondary | ICD-10-CM | POA: Diagnosis not present

## 2015-04-19 DIAGNOSIS — I080 Rheumatic disorders of both mitral and aortic valves: Secondary | ICD-10-CM | POA: Diagnosis not present

## 2015-04-19 DIAGNOSIS — Z09 Encounter for follow-up examination after completed treatment for conditions other than malignant neoplasm: Secondary | ICD-10-CM

## 2015-04-19 DIAGNOSIS — E785 Hyperlipidemia, unspecified: Secondary | ICD-10-CM | POA: Diagnosis present

## 2015-04-19 DIAGNOSIS — D696 Thrombocytopenia, unspecified: Secondary | ICD-10-CM | POA: Diagnosis not present

## 2015-04-19 DIAGNOSIS — I200 Unstable angina: Secondary | ICD-10-CM | POA: Diagnosis not present

## 2015-04-19 DIAGNOSIS — I214 Non-ST elevation (NSTEMI) myocardial infarction: Secondary | ICD-10-CM | POA: Diagnosis present

## 2015-04-19 DIAGNOSIS — J90 Pleural effusion, not elsewhere classified: Secondary | ICD-10-CM | POA: Diagnosis not present

## 2015-04-19 DIAGNOSIS — N390 Urinary tract infection, site not specified: Secondary | ICD-10-CM | POA: Diagnosis present

## 2015-04-19 DIAGNOSIS — Z7982 Long term (current) use of aspirin: Secondary | ICD-10-CM

## 2015-04-19 DIAGNOSIS — Z7984 Long term (current) use of oral hypoglycemic drugs: Secondary | ICD-10-CM | POA: Diagnosis not present

## 2015-04-19 DIAGNOSIS — N189 Chronic kidney disease, unspecified: Secondary | ICD-10-CM | POA: Diagnosis present

## 2015-04-19 DIAGNOSIS — N183 Chronic kidney disease, stage 3 (moderate): Secondary | ICD-10-CM | POA: Diagnosis not present

## 2015-04-19 DIAGNOSIS — I259 Chronic ischemic heart disease, unspecified: Secondary | ICD-10-CM | POA: Diagnosis not present

## 2015-04-19 DIAGNOSIS — E119 Type 2 diabetes mellitus without complications: Secondary | ICD-10-CM | POA: Diagnosis not present

## 2015-04-19 DIAGNOSIS — Z79899 Other long term (current) drug therapy: Secondary | ICD-10-CM | POA: Diagnosis not present

## 2015-04-19 DIAGNOSIS — I25110 Atherosclerotic heart disease of native coronary artery with unstable angina pectoris: Principal | ICD-10-CM | POA: Diagnosis present

## 2015-04-19 DIAGNOSIS — I10 Essential (primary) hypertension: Secondary | ICD-10-CM | POA: Diagnosis not present

## 2015-04-19 HISTORY — DX: Atherosclerotic heart disease of native coronary artery without angina pectoris: I25.10

## 2015-04-19 HISTORY — DX: Hyperlipidemia, unspecified: E78.5

## 2015-04-19 HISTORY — DX: Type 2 diabetes mellitus without complications: E11.9

## 2015-04-19 LAB — COMPREHENSIVE METABOLIC PANEL
ALBUMIN: 4.1 g/dL (ref 3.5–5.0)
ALT: 29 U/L (ref 17–63)
AST: 23 U/L (ref 15–41)
Alkaline Phosphatase: 51 U/L (ref 38–126)
Anion gap: 7 (ref 5–15)
BUN: 23 mg/dL — AB (ref 6–20)
CHLORIDE: 103 mmol/L (ref 101–111)
CO2: 26 mmol/L (ref 22–32)
CREATININE: 1.55 mg/dL — AB (ref 0.61–1.24)
Calcium: 9.4 mg/dL (ref 8.9–10.3)
GFR calc Af Amer: 51 mL/min — ABNORMAL LOW (ref >60)
GFR, EST NON AFRICAN AMERICAN: 44 mL/min — AB (ref >60)
GLUCOSE: 193 mg/dL — AB (ref 65–99)
POTASSIUM: 4.4 mmol/L (ref 3.5–5.1)
Sodium: 136 mmol/L (ref 135–145)
Total Bilirubin: 1.2 mg/dL (ref 0.3–1.2)
Total Protein: 6.8 g/dL (ref 6.5–8.1)

## 2015-04-19 LAB — CBC
HEMATOCRIT: 36.2 % — AB (ref 40.0–52.0)
Hemoglobin: 11.5 g/dL — ABNORMAL LOW (ref 13.0–18.0)
MCH: 19.4 pg — AB (ref 26.0–34.0)
MCHC: 31.8 g/dL — AB (ref 32.0–36.0)
MCV: 61.1 fL — AB (ref 80.0–100.0)
Platelets: 93 10*3/uL — ABNORMAL LOW (ref 150–440)
RBC: 5.93 MIL/uL — ABNORMAL HIGH (ref 4.40–5.90)
RDW: 16.8 % — AB (ref 11.5–14.5)
WBC: 6.4 10*3/uL (ref 3.8–10.6)

## 2015-04-19 LAB — URINE MICROSCOPIC-ADD ON

## 2015-04-19 LAB — GLUCOSE, CAPILLARY
GLUCOSE-CAPILLARY: 177 mg/dL — AB (ref 65–99)
Glucose-Capillary: 177 mg/dL — ABNORMAL HIGH (ref 65–99)
Glucose-Capillary: 283 mg/dL — ABNORMAL HIGH (ref 65–99)
Glucose-Capillary: 163 mg/dL — ABNORMAL HIGH (ref 65–99)
Glucose-Capillary: 214 mg/dL — ABNORMAL HIGH (ref 65–99)

## 2015-04-19 LAB — URINALYSIS, ROUTINE W REFLEX MICROSCOPIC
Bilirubin Urine: NEGATIVE
GLUCOSE, UA: 250 mg/dL — AB
Ketones, ur: NEGATIVE mg/dL
NITRITE: POSITIVE — AB
PH: 5.5 (ref 5.0–8.0)
PROTEIN: NEGATIVE mg/dL
Specific Gravity, Urine: 1.014 (ref 1.005–1.030)

## 2015-04-19 LAB — CREATININE, SERUM
CREATININE: 1.6 mg/dL — AB (ref 0.61–1.24)
GFR calc Af Amer: 49 mL/min — ABNORMAL LOW (ref >60)
GFR calc non Af Amer: 42 mL/min — ABNORMAL LOW (ref >60)

## 2015-04-19 MED ORDER — INSULIN ASPART 100 UNIT/ML ~~LOC~~ SOLN
0.0000 [IU] | Freq: Every day | SUBCUTANEOUS | Status: DC
  Administered 2015-04-19: 2 [IU] via SUBCUTANEOUS

## 2015-04-19 MED ORDER — BISACODYL 5 MG PO TBEC
5.0000 mg | DELAYED_RELEASE_TABLET | Freq: Every day | ORAL | Status: DC | PRN
Start: 2015-04-19 — End: 2015-04-21

## 2015-04-19 MED ORDER — ATORVASTATIN CALCIUM 40 MG PO TABS
40.0000 mg | ORAL_TABLET | Freq: Every day | ORAL | Status: DC
  Administered 2015-04-19 – 2015-04-25 (×6): 40 mg via ORAL
  Filled 2015-04-19 (×6): qty 1

## 2015-04-19 MED ORDER — ONDANSETRON HCL 4 MG PO TABS
4.0000 mg | ORAL_TABLET | Freq: Four times a day (QID) | ORAL | Status: DC | PRN
Start: 2015-04-19 — End: 2015-04-21

## 2015-04-19 MED ORDER — SODIUM CHLORIDE 0.9 % IJ SOLN: 3.0000 mL | INTRAMUSCULAR | Status: DC | PRN

## 2015-04-19 MED ORDER — METOPROLOL TARTRATE 12.5 MG HALF TABLET
12.5000 mg | ORAL_TABLET | Freq: Two times a day (BID) | ORAL | Status: DC
  Administered 2015-04-19 – 2015-04-20 (×4): 12.5 mg via ORAL
  Filled 2015-04-19 (×4): qty 1

## 2015-04-19 MED ORDER — ONDANSETRON HCL 4 MG/2ML IJ SOLN: 4.0000 mg | Freq: Four times a day (QID) | INTRAMUSCULAR | Status: DC | PRN

## 2015-04-19 MED ORDER — SODIUM CHLORIDE 0.9 % IV SOLN: 250.0000 mL | INTRAVENOUS | Status: DC | PRN

## 2015-04-19 MED ORDER — ACETAMINOPHEN 650 MG RE SUPP
650.0000 mg | Freq: Four times a day (QID) | RECTAL | Status: DC | PRN
Start: 2015-04-19 — End: 2015-04-21

## 2015-04-19 MED ORDER — ALUM & MAG HYDROXIDE-SIMETH 200-200-20 MG/5ML PO SUSP: 30.0000 mL | Freq: Four times a day (QID) | ORAL | Status: DC | PRN

## 2015-04-19 MED ORDER — SIMVASTATIN 20 MG PO TABS: 20.0000 mg | ORAL_TABLET | Freq: Every day | ORAL | Status: DC

## 2015-04-19 MED ORDER — SODIUM CHLORIDE 0.9 % IJ SOLN
3.0000 mL | Freq: Two times a day (BID) | INTRAMUSCULAR | Status: DC
  Administered 2015-04-19 – 2015-04-20 (×4): 3 mL via INTRAVENOUS

## 2015-04-19 MED ORDER — ACETAMINOPHEN 325 MG PO TABS
650.0000 mg | ORAL_TABLET | Freq: Four times a day (QID) | ORAL | Status: DC | PRN
Start: 2015-04-19 — End: 2015-04-21

## 2015-04-19 MED ORDER — NITROGLYCERIN 2 % TD OINT
0.5000 [in_us] | TOPICAL_OINTMENT | Freq: Four times a day (QID) | TRANSDERMAL | Status: DC
  Administered 2015-04-19 – 2015-04-21 (×8): 0.5 [in_us] via TOPICAL
  Filled 2015-04-19: qty 30

## 2015-04-19 MED ORDER — INSULIN ASPART 100 UNIT/ML ~~LOC~~ SOLN
0.0000 [IU] | Freq: Three times a day (TID) | SUBCUTANEOUS | Status: DC
  Administered 2015-04-19: 8 [IU] via SUBCUTANEOUS
  Administered 2015-04-19: 3 [IU] via SUBCUTANEOUS
  Administered 2015-04-20: 8 [IU] via SUBCUTANEOUS
  Administered 2015-04-20 (×2): 5 [IU] via SUBCUTANEOUS

## 2015-04-19 MED ORDER — DOCUSATE SODIUM 100 MG PO CAPS
100.0000 mg | ORAL_CAPSULE | Freq: Every day | ORAL | Status: DC
  Administered 2015-04-19 – 2015-04-20 (×2): 100 mg via ORAL
  Filled 2015-04-19 (×2): qty 1

## 2015-04-19 NOTE — Progress Notes (Signed)
UR Completed. Aryssa Rosamond, RN, BSN.  [phone] 

## 2015-04-19 NOTE — Progress Notes (Signed)
[facility] [place] NAME: [name]    MR#:  [id number]  DATE OF BIRTH:  15-Jun-1945  SUBJECTIVE:  CHIEF COMPLAINT:   Chief Complaint  Patient presents with  . Chest Pain   patient was seen earlier today Patient presents with chest pains across the chest, no pain at present, troponin is mildly elevated at 0.89, patient was seen by cardiologist and underwent cardiac catheterization which revealed multivessel disease and the coronary artery bypass grafting was recommended. Today in the morning. Patient developed some chest discomfort in the left upper chest, lasting just briefly, he is being transferred to [facility] cardiac surgery today   Review of Systems  Constitutional: Negative for fever, chills and weight loss.  HENT: Negative for congestion.   Eyes: Negative for blurred vision and double vision.  Respiratory: Negative for cough, sputum production, shortness of breath and wheezing.   Cardiovascular: Negative for chest pain, palpitations, orthopnea, leg swelling and PND.  Gastrointestinal: Negative for nausea, vomiting, abdominal pain, diarrhea, constipation and blood in stool.  Genitourinary: Negative for dysuria, urgency, frequency and hematuria.  Musculoskeletal: Negative for falls.  Neurological: Negative for dizziness, tremors, focal weakness and headaches.  Endo/Heme/Allergies: Does not bruise/bleed easily.  Psychiatric/Behavioral: Negative for depression. The patient does not have insomnia.     VITAL SIGNS: Blood pressure 118/62, pulse 61, temperature 98 F (36.7 C), temperature source Oral, resp. rate 20, height 5\' 10"  (1.778 m), weight 93.396 kg (205 lb 14.4 oz), SpO2 95 %.  PHYSICAL EXAMINATION:   GENERAL:  70 y.o.-year-old patient lying in the bed with no acute distress.  EYES: Pupils equal, round, reactive to light and accommodation. No scleral icterus. Extraocular muscles intact.  HEENT: Head  atraumatic, normocephalic. Oropharynx and nasopharynx clear.  NECK:  Supple, no jugular venous distention. No thyroid enlargement, no tenderness.  LUNGS: Normal breath sounds bilaterally, no wheezing, rales,rhonchi or crepitation. No use of accessory muscles of respiration.  CARDIOVASCULAR: S1, S2 normal. No murmurs, rubs, or gallops.  ABDOMEN: Soft, nontender, nondistended. Bowel sounds present. No organomegaly or mass.  EXTREMITIES: No pedal edema, cyanosis, or clubbing.  NEUROLOGIC: Cranial nerves II through XII are intact. Muscle strength 5/5 in all extremities. Sensation intact. Gait not checked.  PSYCHIATRIC: The patient is alert and oriented x 3.  SKIN: No obvious rash, lesion, or ulcer.   ORDERS/RESULTS REVIEWED:   CBC  Recent Labs Lab 04/16/15 2345 04/17/15 0517 04/18/15 0532 04/19/15 0637  WBC 7.0 6.1 6.2 6.4  HGB 13.3 11.9* 11.9* 11.5*  HCT 41.0 37.4* 37.0* 36.2*  PLT 116* 88* 84* 93*  MCV 61.3* 61.0* 60.9* 61.1*  MCH 19.9* 19.3* 19.6* 19.4*  MCHC 32.5 31.7* 32.3 31.8*  RDW 16.8* 16.4* 16.6* 16.8*   ------------------------------------------------------------------------------------------------------------------  Chemistries   Recent Labs Lab 04/16/15 2345 04/17/15 0517 04/19/15 0637 04/19/15 1022  NA SEE COMMENTS 139  --  136  K 4.3 5.1  --  4.4  CL 102 104  --  103  CO2 30 28  --  26  GLUCOSE 154* 179*  --  193*  BUN 27* 29*  --  23*  CREATININE 1.42* 1.32* 1.60* 1.55*  CALCIUM 9.9 9.7  --  9.4  AST  --   --   --  23  ALT  --   --   --  29  ALKPHOS  --   --   --  51  BILITOT  --   --   --  1.2   ------------------------------------------------------------------------------------------------------------------ estimated creatinine clearance is 51.7 mL/min (by C-G formula based on Cr of 1.55). ------------------------------------------------------------------------------------------------------------------ No results for input(s): TSH, T4TOTAL, T3FREE,  THYROIDAB in the last 72 hours.  Invalid input(s): FREET3  Cardiac Enzymes  Recent Labs Lab 04/16/15 2345 04/17/15 0517 04/17/15 1026  TROPONINI 0.06* 0.35* 0.89*   ------------------------------------------------------------------------------------------------------------------ Invalid input(s): POCBNP ---------------------------------------------------------------------------------------------------------------  RADIOLOGY: No results found.  EKG:  Orders placed or performed during the hospital encounter of 04/16/15  . EKG 12-Lead  . EKG 12-Lead  . ED EKG within 10 minutes  . ED EKG within 10 minutes    ASSESSMENT AND PLAN:  Principal Problem:   Unstable angina (HCC) Active Problems:   Elevated troponin   Coronary artery disease   Type 2 diabetes mellitus (HCC)   HTN (hypertension)  #1. Unstable angina is likely non-Q-wave MI with minimal elevation of troponin, continue patient on aspirin, Imdur , beta blockers, status post cardiac catheterization on the 10th of  January , revealing multivessel coronary artery disease , patient is being transferred to cardiothoracic surgery for coronary artery bypass grafting consideration  #2. Renal insufficiency, chronic stable periprocedure #3. Diabetes mellitus type 2. Hemoglobin A1c 7.5, holding metformin due to renal insufficiency, initiated low-dose of glipizide #4. Essential hypertension, well controlled, continue outpatient therapy, added  Imdur, metoprolol to Losartan   Management plans discussed with the patient, family and they are in agreement.   DRUG ALLERGIES: No Known Allergies  CODE STATUS:     Code Status Orders        Start     Ordered   04/17/15 0438  Full code   Continuous     04/17/15 0437      TOTAL TIME TAKING CARE OF THIS PATIENT: 35 minutes.    Theodoro Grist M.D on 04/19/2015 at 3:52 PM  Between 7am to 6pm - Pager - [phone]  After 6pm go to [email or web address] - password [email or web address] [facility]  [facility]  Office  [phone]  CC: Primary care physician; Otilio Miu, MD

## 2015-04-19 NOTE — H&P (Signed)
[address]       [place], [address]             [phone]        Kazden T [facility] [place] Medical Record [id number] Date of Birth: [birth year]  Referring: Dr. Clayborn Bigness, MD, Dr. Ubaldo Glassing MD Primary Care: Otilio Miu, MD  Chief Complaint:   Chest tightness   History of Present Illness:     This is a 70 year old Caucasian male with a history of hypertension and diabetes who presented with anginal symptoms over the last 24 hours. He was resting on the sofa when he began to have chest tightness. He states that this sensation waxed and waned throughout the day with no apparent relation to his level of activity. It was associated with some mild shortness of breath. His symptoms worsened so presented to [facility] pm 04/17/2015. EKG showed inferior Q waves. His initial Troponin I was .06 and went up to 0.89.  Because of elevated troponins, abnormal EKG, and anginal symptoms a cardiology consultation was recommended. Patient underwent a cardiac catheterization with Dr. Clayborn Bigness on 04/18/2015. 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      In addition, patient states he has been followed by Dr. Ubaldo Glassing. An EKG done pre op several years ago was "questionable" so he underwent a stress test. Again, he states this was somewhat "questionable", but he was followed yearly by Dr. Ubaldo Glassing. Currently, patient is chest pain free and denies shortness of breath.  Current Activity/ Functional Status: Patient is independent with mobility/ambulation, transfers, ADL's, IADL's.   Zubrod Score: At the time of surgery this patient's most appropriate activity status/level should be described as: []     0    Normal activity, no symptoms [x]     1    Restricted in physical strenuous activity but ambulatory, able to do out light work []     2    Ambulatory and capable of self care, unable to do work activities, up and about more than 50%  Of the time                             []     3    Only limited self care, in bed greater than 50% of waking hours []     4    Completely disabled, no self care, confined to bed or chair []     5    Moribund  Past Medical History  Diagnosis Date  . Diabetes mellitus without complication ([place])   . Hypertension     Past Surgical History  Procedure Laterality Date  . Hand surgery (for Dupuytren contracture) Left 3-4 years ago  . Knee surgery (for meniscus problem) Right 1995  . Colonoscopy  2014    cleared for 10 yrs  . Cardiac catheterization N/A 04/18/2015    Procedure: Left Heart Cath and Coronary Angiography;  Surgeon: Yolonda Kida, MD;  Location: [facility];  Service: Cardiovascular;  Laterality: N/A;     Social History   Social History  . Marital Status: Married    Spouse Name: N/A  . Number of Children: 1 son who lives with he and his wife  . Years of Education: N/A   Occupational History  . Retired. Previously a Architectural technologist for [employer] History Main Topics  . Smoking status: Never Smoker   . Smokeless tobacco:  Not on file  . Alcohol Use: Rarely  . Drug Use: No  . Sexual Activity: Not Currently   Allergies: No Known Allergies  Current Facility-Administered Medications  Medication Dose Route Frequency Provider Last Rate Last Dose  . 0.9 %  sodium chloride infusion  250 mL Intravenous PRN Nani Skillern, PA-C      . acetaminophen (TYLENOL) tablet 650 mg  650 mg Oral Q6H PRN Nani Skillern, PA-C       Or  . acetaminophen (TYLENOL) suppository 650 mg  650 mg Rectal Q6H PRN Nani Skillern, PA-C      . alum & mag hydroxide-simeth (MAALOX/MYLANTA) 200-200-20 MG/5ML suspension 30 mL  30 mL Oral Q6H PRN Donielle Liston Alba, PA-C      . atorvastatin (LIPITOR) tablet 40 mg  40 mg Oral q1800 Donielle Liston Alba, PA-C      . bisacodyl (DULCOLAX) EC tablet 5 mg  5 mg Oral Daily PRN Donielle M Zimmerman, PA-C      . docusate sodium (COLACE) capsule 100 mg  100 mg  Oral Daily Donielle M Zimmerman, PA-C      . insulin aspart (novoLOG) injection 0-15 Units  0-15 Units Subcutaneous TID WC Donielle Liston Alba, PA-C      . insulin aspart (novoLOG) injection 0-5 Units  0-5 Units Subcutaneous QHS Donielle M Zimmerman, PA-C      . ondansetron [facility]) tablet 4 mg  4 mg Oral Q6H PRN Nani Skillern, PA-C       Or  . ondansetron [facility]) injection 4 mg  4 mg Intravenous Q6H PRN Donielle Liston Alba, PA-C      . sodium chloride 0.9 % injection 3 mL  3 mL Intravenous Q12H Donielle Liston Alba, PA-C      . sodium chloride 0.9 % injection 3 mL  3 mL Intravenous Q12H Donielle Liston Alba, PA-C      . sodium chloride 0.9 % injection 3 mL  3 mL Intravenous PRN Nani Skillern, PA-C        Prescriptions prior to admission  Medication Sig Dispense Refill Last Dose  . aspirin 81 MG tablet Take 81 mg by mouth daily.   04/16/2015 at Unknown time  . atorvastatin (LIPITOR) 40 MG tablet Take 1 tablet (40 mg total) by mouth daily. 30 tablet 6   . glipiZIDE (GLUCOTROL) 5 MG tablet Take 1 tablet (5 mg total) by mouth 2 (two) times daily. 180 tablet 2 04/16/2015 at Unknown time  . isosorbide mononitrate (IMDUR) 30 MG 24 hr tablet Take 1 tablet (30 mg total) by mouth daily. 30 tablet 6   . losartan (COZAAR) 50 MG tablet Take 1 tablet (50 mg total) by mouth daily. 90 tablet 2 04/16/2015 at Unknown time  . metoprolol tartrate (LOPRESSOR) 25 MG tablet Take 1 tablet (25 mg total) by mouth 2 (two) times daily. 60 tablet 6     Family History  Problem Relation Age of Onset  . Diabetes Mother   . Heart attack    . Hypertension    His mother was decreased at age 109 He is estranged from his father   Review of Systems:    Cardiac Review of Systems: Y or N  Chest tightness [  Y  ]  Resting SOB [ Y  ] Exertional SOB  [ Y ]  Orthopnea [ N ]   Pedal Edema [  N ]    Palpitations [ N ] Syncope  [ N ]  Presyncope [  N ]  General Review of Systems: [Y] = yes [ N ]=no Constitional:  fatigue [Y  ]; nausea Aqua.Slicker  ]; night sweats [ N ]; fever Aqua.Slicker  ]; or chills [ N ]                                                              Eye : blurred vision [ N ]; diplopia [ N  ]; Amaurosis fugax[ N ]; Resp: cough Aqua.Slicker  ];  wheezing[N  ];  hemoptysis[N  ];  GI:  vomiting[ N ];  dysphagia[  ]; melena[  ];  hematochezia [  ];   Hx of  Colonoscopy[Y  ]; GU: hematuria[ N ];   dysuria [ N ];  nocturia[N  ];  ;              Skin: rash, swelling[ N ];l  or itching[  N]; Musculosketetal: myalgias[ N ];  joint swelling[N  ];  joint erythema[ N ];   Heme/Lymph: bruising[ N ];  bleeding[N  ];  anemia[Y  ];  Neuro: TIA[ N ];   stroke[N  ];  vertigo[  N];  seizures[N  ]; difficulty walking[ N ];  Psych:depression[N  ]; anxiety[ N ];  Endocrine: diabetes[ Y ];  thyroid dysfunction[ N ];   Physical Exam: BP 138/70 mmHg  Pulse 59  Temp(Src) 98.2 F (36.8 C) (Oral)  Resp 19  SpO2 97%   General appearance: alert, cooperative and no distress Resp: clear to auscultation bilaterally Cardio: RRR, no murmur or rub GI: Soft, non tender, bowel sounds present Extremities: extremities normal, atraumatic, no cyanosis or edema Neurologic: Grossly normal  Diagnostic Studies & Laboratory data:  Cardiac Catheterization done on 04/18/2015 by Dr. Clayborn Bigness:   Dist Cx lesion, 80% stenosed. The lesion was not previously treated.  2nd Mrg lesion, 99% stenosed. The lesion was not previously treated.  1st Diag-1 lesion, 70% stenosed. The lesion was not previously treated.  1st Diag-2 lesion, 99% stenosed. The lesion was not previously treated.  Mid LAD lesion, 60% stenosed. The lesion was not previously treated.  Mid LAD to Dist LAD lesion, 70% stenosed. The lesion was not previously treated.  Dist LAD lesion, 75% stenosed. The lesion was not previously treated.  Prox RCA lesion, 100% stenosed. The lesion was not previously treated.  normal overall left ventricular function ejection fraction of  55%  suggest coronary bypass surgery for multivessel coronary disease  Conclusion Multivessel coronary disease preserved left ventricular function recommend coronary bypass surgery at [facility] will discuss case with Dr. Myles Lipps.   Recent Radiology Findings:    EXAM: CHEST 2 VIEW  COMPARISON: 02/04/2012  FINDINGS: The cardiomediastinal contours are normal. Mild interstitial thickening. Pulmonary vasculature is normal. No consolidation, pleural effusion, or pneumothorax. No acute osseous abnormalities are seen.  IMPRESSION: Mild interstitial thickening, may reflect atypical infection.   Electronically Signed  By: Jeb Levering M.D.  On: 04/17/2015 00:39  I have independently reviewed the above radiologic studies.  Recent Lab Findings: Lab Results  Component Value Date   WBC 6.4 04/19/2015   HGB 11.5* 04/19/2015   HCT 36.2* 04/19/2015   PLT 93* 04/19/2015   GLUCOSE 179* 04/17/2015   CHOL 160 12/01/2014   TRIG 125 12/01/2014  HDL 32* 12/01/2014   LDLCALC 103* 12/01/2014   NA 139 04/17/2015   K 5.1 04/17/2015   CL 104 04/17/2015   CREATININE 1.60* 04/19/2015   BUN 29* 04/17/2015   CO2 28 04/17/2015   HGBA1C 7.5* 04/17/2015   Chronic Kidney Disease   Stage I     GFR >90  Stage II    GFR 60-89  Stage IIIA GFR 45-59  Stage IIIB GFR 30-44  Stage IV   GFR 15-29  Stage V    GFR  <15  Lab Results  Component Value Date   CREATININE 1.55* 04/19/2015   Estimated Creatinine Clearance: 52 mL/min (by C-G formula based on Cr of 1.55).     Assessment / Plan:    1. Coronary artery disease- Possible NSTEMI as initial EKG showed Q waves and has had mildly elevated Troponin I. I have reviewed the patients history and cath films and have recommended cabg due to history of DM, 3 vessel CAD. Treatment options discussed with patient and his wife . Tentaive plan cabg Friday if plt and cr levels are stable.  2. DM-accu checks with SS coverage. Will not  restart Metformin or Glipizide as elevated creatinine. Pre op HGA1C 7.5 so he will need close medical follow up after discharge. 3. Hypertension-continue Lopressor at reduced dose with parameters 4. Will hold ecasa at this time as platelet count 93,000, follow up plt count in am 5Stage IIIA renal insufficiency  I  spent 40 minutes counseling the patient face to face and 50% or more the  time was spent in counseling and coordination of care. The total time spent in the appointment was 60 minutes.  Grace Isaac MD      [address] [place] [address] Office [phone]   [place]

## 2015-04-19 NOTE — Progress Notes (Signed)
CARDIAC REHAB PHASE I   PRE:  Rate/Rhythm: 61 SR    BP: sitting 129/84    SaO2: 97 RA  MODE:  Ambulation: 450 ft   POST:  Rate/Rhythm: 86 SR    BP: sitting 176/81     SaO2: 97 RA  Pt stated he had very mild chest tightness lying in bed, .5 out of 10. Wanted to walk. Tolerated well with slight increase to 1/10 chest tightness. Rested and then stated 0/10, completely gone. Discussed IS, mobility, d/c planning and sternal precautions. Voiced understanding. Gave pt OHS booklet, care guide, and video to watch. Encouraged him to share with wife. Wife apparently works but he sts it won't be a problem for him to have 24/7 care at d/c. [place], [place], ACSM 04/19/2015 2:23 PM

## 2015-04-19 NOTE — Progress Notes (Signed)
Pt being transferred to [facility] Room 6/ report called to lauren,RN/ verbalized an understanding/ report also given to [facility]/ [facility] to transport.

## 2015-04-20 ENCOUNTER — Inpatient Hospital Stay (HOSPITAL_COMMUNITY): Payer: Medicare Other

## 2015-04-20 DIAGNOSIS — D696 Thrombocytopenia, unspecified: Secondary | ICD-10-CM | POA: Diagnosis present

## 2015-04-20 DIAGNOSIS — I2510 Atherosclerotic heart disease of native coronary artery without angina pectoris: Secondary | ICD-10-CM

## 2015-04-20 LAB — CBC
HEMATOCRIT: 35.6 % — AB (ref 39.0–52.0)
Hemoglobin: 11.6 g/dL — ABNORMAL LOW (ref 13.0–17.0)
MCH: 19.6 pg — AB (ref 26.0–34.0)
MCHC: 32.6 g/dL (ref 30.0–36.0)
MCV: 60.1 fL — AB (ref 78.0–100.0)
PLATELETS: 96 10*3/uL — AB (ref 150–400)
RBC: 5.92 MIL/uL — ABNORMAL HIGH (ref 4.22–5.81)
RDW: 16.1 % — AB (ref 11.5–15.5)
WBC: 5.4 10*3/uL (ref 4.0–10.5)

## 2015-04-20 LAB — PULMONARY FUNCTION TEST
DL/VA % pred: 102 %
DL/VA: 4.7 ml/min/mmHg/L
DLCO cor % pred: 82 %
DLCO cor: 26.75 ml/min/mmHg
DLCO unc % pred: 74 %
DLCO unc: 24.17 ml/min/mmHg
FEF 25-75 Post: 2.83 L/sec
FEF 25-75 Pre: 3.26 L/sec
FEF2575-%Change-Post: -13 %
FEF2575-%Pred-Post: 113 %
FEF2575-%Pred-Pre: 130 %
FEV1-%Change-Post: -3 %
FEV1-%Pred-Post: 88 %
FEV1-%Pred-Pre: 91 %
FEV1-Post: 2.89 L
FEV1-Pre: 2.99 L
FEV1FVC-%Change-Post: -3 %
FEV1FVC-%Pred-Pre: 110 %
FEV6-%Change-Post: -1 %
FEV6-%Pred-Post: 83 %
FEV6-%Pred-Pre: 85 %
FEV6-Post: 3.52 L
FEV6-Pre: 3.58 L
FEV6FVC-%Change-Post: -1 %
FEV6FVC-%Pred-Post: 101 %
FEV6FVC-%Pred-Pre: 103 %
FVC-%Change-Post: 0 %
FVC-%Pred-Post: 82 %
FVC-%Pred-Pre: 82 %
FVC-Post: 3.65 L
FVC-Pre: 3.65 L
Post FEV1/FVC ratio: 79 %
Post FEV6/FVC ratio: 97 %
Pre FEV1/FVC ratio: 82 %
Pre FEV6/FVC Ratio: 98 %
RV % pred: 78 %
RV: 1.92 L
TLC % pred: 81 %
TLC: 5.72 L

## 2015-04-20 LAB — GLUCOSE, CAPILLARY
Glucose-Capillary: 203 mg/dL — ABNORMAL HIGH (ref 65–99)
Glucose-Capillary: 298 mg/dL — ABNORMAL HIGH (ref 65–99)
Glucose-Capillary: 218 mg/dL — ABNORMAL HIGH (ref 65–99)
Glucose-Capillary: 162 mg/dL — ABNORMAL HIGH (ref 65–99)

## 2015-04-20 LAB — COMPREHENSIVE METABOLIC PANEL
ALT: 28 U/L (ref 17–63)
AST: 23 U/L (ref 15–41)
Albumin: 4.1 g/dL (ref 3.5–5.0)
Alkaline Phosphatase: 53 U/L (ref 38–126)
Anion gap: 6 (ref 5–15)
BUN: 24 mg/dL — ABNORMAL HIGH (ref 6–20)
CO2: 27 mmol/L (ref 22–32)
Calcium: 9.3 mg/dL (ref 8.9–10.3)
Chloride: 103 mmol/L (ref 101–111)
Creatinine, Ser: 1.55 mg/dL — ABNORMAL HIGH (ref 0.61–1.24)
GFR calc Af Amer: 51 mL/min — ABNORMAL LOW (ref >60)
GFR calc non Af Amer: 44 mL/min — ABNORMAL LOW (ref >60)
Glucose, Bld: 244 mg/dL — ABNORMAL HIGH (ref 65–99)
Potassium: 4.7 mmol/L (ref 3.5–5.1)
Sodium: 136 mmol/L (ref 135–145)
Total Bilirubin: 0.8 mg/dL (ref 0.3–1.2)
Total Protein: 6.6 g/dL (ref 6.5–8.1)

## 2015-04-20 LAB — PROTIME-INR
INR: 1.09 (ref 0.00–1.49)
Prothrombin Time: 14.3 seconds (ref 11.6–15.2)

## 2015-04-20 LAB — TYPE AND SCREEN
ABO/RH(D): A POS
Antibody Screen: NEGATIVE

## 2015-04-20 LAB — ABO/RH: ABO/RH(D): A POS

## 2015-04-20 MED ORDER — INSULIN REGULAR HUMAN 100 UNIT/ML IJ SOLN
INTRAMUSCULAR | Status: DC
  Filled 2015-04-20: qty 2.5

## 2015-04-20 MED ORDER — TEMAZEPAM 15 MG PO CAPS: 15.0000 mg | ORAL_CAPSULE | Freq: Once | ORAL | Status: DC | PRN

## 2015-04-20 MED ORDER — NITROGLYCERIN IN D5W 200-5 MCG/ML-% IV SOLN
2.0000 ug/min | INTRAVENOUS | Status: AC
Start: 2015-04-21 — End: 2015-04-21
  Administered 2015-04-21: 5 ug/min via INTRAVENOUS
  Filled 2015-04-20: qty 250

## 2015-04-20 MED ORDER — POTASSIUM CHLORIDE 2 MEQ/ML IV SOLN
80.0000 meq | INTRAVENOUS | Status: DC
  Filled 2015-04-20: qty 40

## 2015-04-20 MED ORDER — SODIUM CHLORIDE 0.9 % IV SOLN
INTRAVENOUS | Status: DC
  Filled 2015-04-20: qty 30

## 2015-04-20 MED ORDER — DOPAMINE-DEXTROSE 3.2-5 MG/ML-% IV SOLN
0.0000 ug/kg/min | INTRAVENOUS | Status: DC
  Filled 2015-04-20: qty 250

## 2015-04-20 MED ORDER — ALPRAZOLAM 0.25 MG PO TABS: 0.2500 mg | ORAL_TABLET | ORAL | Status: DC | PRN

## 2015-04-20 MED ORDER — PAPAVERINE HCL 30 MG/ML IJ SOLN
INTRAMUSCULAR | Status: AC
  Administered 2015-04-21: 500 mL
  Filled 2015-04-20: qty 2.5

## 2015-04-20 MED ORDER — CEFUROXIME SODIUM 750 MG IJ SOLR
750.0000 mg | INTRAMUSCULAR | Status: DC
  Filled 2015-04-20: qty 750

## 2015-04-20 MED ORDER — CHLORHEXIDINE GLUCONATE CLOTH 2 % EX PADS
6.0000 | MEDICATED_PAD | Freq: Once | CUTANEOUS | Status: AC
Start: 2015-04-21 — End: 2015-04-21
  Administered 2015-04-21: 6 via TOPICAL

## 2015-04-20 MED ORDER — BISACODYL 5 MG PO TBEC
5.0000 mg | DELAYED_RELEASE_TABLET | Freq: Once | ORAL | Status: DC
  Filled 2015-04-20: qty 1

## 2015-04-20 MED ORDER — CHLORHEXIDINE GLUCONATE 0.12 % MT SOLN
15.0000 mL | Freq: Once | OROMUCOSAL | Status: AC
  Administered 2015-04-21: 15 mL via OROMUCOSAL
  Filled 2015-04-20: qty 15

## 2015-04-20 MED ORDER — VANCOMYCIN HCL 10 G IV SOLR
1250.0000 mg | INTRAVENOUS | Status: AC
  Administered 2015-04-21: 1250 mg via INTRAVENOUS
  Filled 2015-04-20 (×2): qty 1250

## 2015-04-20 MED ORDER — DEXMEDETOMIDINE HCL IN NACL 400 MCG/100ML IV SOLN
0.1000 ug/kg/h | INTRAVENOUS | Status: AC
  Administered 2015-04-21: .3 ug/kg/h via INTRAVENOUS
  Filled 2015-04-20: qty 100

## 2015-04-20 MED ORDER — MAGNESIUM SULFATE 50 % IJ SOLN
40.0000 meq | INTRAMUSCULAR | Status: DC
  Filled 2015-04-20: qty 10

## 2015-04-20 MED ORDER — ALBUTEROL SULFATE (2.5 MG/3ML) 0.083% IN NEBU
2.5000 mg | INHALATION_SOLUTION | Freq: Once | RESPIRATORY_TRACT | Status: AC
  Administered 2015-04-20: 2.5 mg via RESPIRATORY_TRACT

## 2015-04-20 MED ORDER — SODIUM CHLORIDE 0.9 % IV SOLN
INTRAVENOUS | Status: DC
  Filled 2015-04-20: qty 40

## 2015-04-20 MED ORDER — PHENYLEPHRINE HCL 10 MG/ML IJ SOLN
30.0000 ug/min | INTRAVENOUS | Status: DC
  Filled 2015-04-20: qty 2

## 2015-04-20 MED ORDER — CHLORHEXIDINE GLUCONATE CLOTH 2 % EX PADS
6.0000 | MEDICATED_PAD | Freq: Once | CUTANEOUS | Status: AC
  Administered 2015-04-20: 6 via TOPICAL

## 2015-04-20 MED ORDER — CIPROFLOXACIN HCL 500 MG PO TABS
250.0000 mg | ORAL_TABLET | Freq: Two times a day (BID) | ORAL | Status: DC
  Administered 2015-04-20 – 2015-04-24 (×9): 250 mg via ORAL
  Filled 2015-04-20 (×13): qty 1

## 2015-04-20 MED ORDER — DEXTROSE 5 % IV SOLN
1.5000 g | INTRAVENOUS | Status: DC
  Filled 2015-04-20: qty 1.5

## 2015-04-20 MED ORDER — EPINEPHRINE HCL 1 MG/ML IJ SOLN
0.0000 ug/min | INTRAMUSCULAR | Status: DC
  Filled 2015-04-20: qty 4

## 2015-04-20 NOTE — Progress Notes (Addendum)
[address]       [place] [address]             [phone]      [name] is a 70 y.o. male patient. 1. CAD (coronary artery disease)   2. Coronary artery disease involving native coronary artery of native heart without angina pectoris    Past Medical History  Diagnosis Date  . Hypertension   . Coronary artery disease   . Hyperlipidemia   . Type II diabetes mellitus (HCC)    Current Facility-Administered Medications  Medication Dose Route Frequency Provider Last Rate Last Dose  . 0.9 %  sodium chloride infusion  250 mL Intravenous PRN Nani Skillern, PA-C      . acetaminophen (TYLENOL) tablet 650 mg  650 mg Oral Q6H PRN Nani Skillern, PA-C       Or  . acetaminophen (TYLENOL) suppository 650 mg  650 mg Rectal Q6H PRN Nani Skillern, PA-C      . alum & mag hydroxide-simeth (MAALOX/MYLANTA) 200-200-20 MG/5ML suspension 30 mL  30 mL Oral Q6H PRN Donielle Liston Alba, PA-C      . atorvastatin (LIPITOR) tablet 40 mg  40 mg Oral q1800 Nani Skillern, PA-C   40 mg at 04/19/15 1833  . bisacodyl (DULCOLAX) EC tablet 5 mg  5 mg Oral Daily PRN Nani Skillern, PA-C      . ciprofloxacin (CIPRO) tablet 250 mg  250 mg Oral BID Wayne E Gold, PA-C      . docusate sodium (COLACE) capsule 100 mg  100 mg Oral Daily Nani Skillern, PA-C   100 mg at 04/19/15 1042  . insulin aspart (novoLOG) injection 0-15 Units  0-15 Units Subcutaneous TID WC Nani Skillern, PA-C   5 Units at 04/20/15 0749  . insulin aspart (novoLOG) injection 0-5 Units  0-5 Units Subcutaneous QHS Nani Skillern, PA-C   2 Units at 04/19/15 2217  . metoprolol tartrate (LOPRESSOR) tablet 12.5 mg  12.5 mg Oral BID Nani Skillern, PA-C   12.5 mg at 04/19/15 2217  . nitroGLYCERIN (NITROGLYN) 2 % ointment 0.5 inch  0.5 inch Topical 4 times per day Nani Skillern, PA-C   0.5 inch at 04/20/15 0748  . ondansetron (ZOFRAN) tablet 4 mg  4 mg Oral Q6H PRN  Nani Skillern, PA-C       Or  . ondansetron [facility]) injection 4 mg  4 mg Intravenous Q6H PRN Donielle Liston Alba, PA-C      . sodium chloride 0.9 % injection 3 mL  3 mL Intravenous Q12H Donielle Liston Alba, PA-C   3 mL at 04/19/15 2200  . sodium chloride 0.9 % injection 3 mL  3 mL Intravenous Q12H Donielle Liston Alba, PA-C   3 mL at 04/19/15 2200  . sodium chloride 0.9 % injection 3 mL  3 mL Intravenous PRN Donielle Liston Alba, PA-C       No Known Allergies Active Problems:   CAD (coronary artery disease)  Blood pressure 108/67, pulse 52, temperature 97.9 F (36.6 C), temperature source Oral, resp. rate 18, height 5\' 10"  (1.778 m), weight 203 lb 11.3 oz (92.4 kg), SpO2 96 %.  Subjective: Symptoms:  Stable.  No shortness of breath, chest pain, weakness, headache or chest pressure.   Diet:  Adequate intake.   Activity level: Normal.   Pain:  He reports no pain.    Objective: General Appearance:  Comfortable.  Vital signs: (most recent): Blood pressure 108/67, pulse 52, temperature 97.9 F (36.6 C), temperature source Oral, resp. rate 18, height 5\' 10"  (1.778 m), weight 203 lb 11.3 oz (92.4 kg), SpO2 96 %.  Vital signs are normal.   Lungs:  Normal respiratory rate and normal effort.  Breath sounds clear to auscultation.   Heart: Normal rate.  Regular rhythm.  S1 normal and S2 normal.  No murmur, gallop or friction rub.  Chest: Asymmetric chest wall expansion.  Extremities: There is no dependent edema.   Neurological: Patient is alert.   Skin:  Warm and dry.   Abdomen: Abdomen is soft and non-distended.  Bowel sounds are normal.   There is no abdominal tenderness.     Assessment:  Condition: In stable condition.     Plan:  (Had PFT's this am Has apparent UTI- started on Cipro  Hematology to see for low platelets which may be chronic Plan for OR in am unless new issues present).    GOLD,WAYNE E 04/20/2015    I have seen the patient and again reviewed with him the  recommended coronary artery bypass grafting. I reviewed the cath films with Dr. Margie Ege , interventional radiology who concurs that the best approach in a patient who is diabetic would be to proceed with coronary artery bypass grafting.  The risks and options have been explained to the patient and his wife in detail and he is willing to proceed. The goals risks and alternatives of the planned surgical procedure CABG have been discussed with the patient in detail. The risks of the procedure including death, infection, stroke, myocardial infarction, bleeding, blood transfusion have all been discussed specifically.  I have quoted [name] a 3 % of perioperative mortality and a complication rate as high as 30 %. The patient's questions have been answered.[name] is willing  to proceed with the planned procedure.. In addition to other potential risks and complications from the surgery, I have made the patient aware of the recent [place] concerning the risk of infection by Myocobacterium chimaera related to the use of Stockert 3T heater-cooler equipment during cardiac surgery. I discussed with the patient the low risk of infection, as well as our compliance with the most current FDA recommendations to minimize infection and testing of all devices for contamination. The patient has been made aware of the limited alternatives to immediately replacing the current equipment. The patient has been informed regarding the risks associated with waiting to proceed with needed surgery and that such risks are greater than the risk of infection related to the use of the heater-cooler device. I did make the patient aware that after careful review of the patients having cardiac surgery at [facility] we have no evidence that heater/cooler related infections have occurred at [facility]. We discussed that this is a slow-growing bacterium, such that it can take some period of time for symptoms to  develop.

## 2015-04-20 NOTE — Progress Notes (Signed)
Responded to page to assist patient with completing advance Directives.  Documents  Were completed. One copy given to nurse for patient file and two copies plus the original  were given to patient.  Chaplain services available as needed.   04/20/15 1500  Clinical Encounter Type  Visited With Patient;Health care provider  Visit Type Initial  Referral From Nurse  Spiritual Encounters  Spiritual Needs Literature  Stress Factors  Patient Stress Factors None identified  Advance Directives (For Healthcare)  Does patient have an advance directive? Yes  Copy of advanced directive(s) in chart? Yes  Jaclynn Major, [place]

## 2015-04-20 NOTE — Progress Notes (Signed)
Inpatient Diabetes Program Recommendations  AACE/ADA: New Consensus Statement on Inpatient Glycemic Control (2015)  Target Ranges:  Prepandial:   less than 140 mg/dL      Peak postprandial:   less than 180 mg/dL (1-2 hours)      Critically ill patients:  140 - 180 mg/dL    Rounding Note for DM Consult regarding A1c and Glucose Control after Surgery  Spoke with patient about diabetes and home regimen for diabetes control. Discussed A1c results 7.5% and what his results were previous to that 6.8%. Discussed basic home care, and maintaining good CBG control to prevent long-term and short-term complications. Discussed goal fasting glucose of 130 and below. The patients fasting ranges from 80-140's. Discussed impact of nutrition, exercise, stress, sickness, and medications on diabetes control.  Patient states he does "ok" following a diabetic diet but does snack on potato chips and does not keep up with how many he eats. Discussed carbohydrates, carbohydrate goals per day and meal, along with portion sizes.  Spoke with patient about a decrease in physical activity and effects on glucose. Spoke with patient about being mindful about what he ate and compensating through his diet when he is not able to be as physically as active until he is worked up to by cardiac rehab and cleared by the doctors. Patient verbalized understanding of information discussed and he states that he has no further questions at this time related to diabetes.   Thanks,  Tama Headings RN, MSN, [facility] Inpatient Diabetes Coordinator Team Pager [phone] (8a-5p)

## 2015-04-20 NOTE — Progress Notes (Signed)
Pt walking independently without chest tightness. Will f/u after CABG. [name] CES, ACSM 11:29 AM 04/20/2015

## 2015-04-20 NOTE — Progress Notes (Signed)
Inpatient Diabetes Program Recommendations  AACE/ADA: New Consensus Statement on Inpatient Glycemic Control (2015)  Target Ranges:  Prepandial:   less than 140 mg/dL      Peak postprandial:   less than 180 mg/dL (1-2 hours)      Critically ill patients:  140 - 180 mg/dL   Review of Glycemic Control  Diabetes history: DM 2 Current orders for Inpatient glycemic control: Novolog Moderate + HS scale  ** Note patient will be going to surgery in the am and will be on insulin gtt.  Inpatient Diabetes Program Recommendations:  Insulin - Basal: Fasting >200, glucose also increases after meals. May want to consider starting low dose basal ( Levemir 8 units) and add meal coverage (Novolog 3 units TID with meals).  Thanks,  Tama Headings RN, MSN, [facility] Inpatient Diabetes Coordinator Team Pager [phone] (8a-5p)

## 2015-04-20 NOTE — Progress Notes (Signed)
Pre-op Cardiac Surgery  Carotid Findings:  No evidence of stenosis ICAs bilaterally. Vertebral artery are patent and antegrade.   Upper Extremity Right Left  Brachial Pressures 131 124  Radial Waveforms Triphasic Triphasic  Ulnar Waveforms Triphasic Triphasic  Palmar Arch (Allen's Test) Right  Decrease >50 Rt ulnar  With normal limits   Findings:      Lower  Extremity Right Left  Dorsalis Pedis 155 143  Posterior Tibial 161 159  Ankle/Brachial Indices 1.23 1.21    Findings:

## 2015-04-21 ENCOUNTER — Inpatient Hospital Stay (HOSPITAL_COMMUNITY): Payer: Medicare Other

## 2015-04-21 ENCOUNTER — Inpatient Hospital Stay (HOSPITAL_COMMUNITY): Payer: Medicare Other | Admitting: Anesthesiology

## 2015-04-21 ENCOUNTER — Encounter (HOSPITAL_COMMUNITY)
Admission: EM | Disposition: A | Discharge status: Home / Self Care | Payer: Medicare Other | Source: Other Acute Inpatient Hospital | Attending: Cardiothoracic Surgery

## 2015-04-21 DIAGNOSIS — Z951 Presence of aortocoronary bypass graft: Secondary | ICD-10-CM

## 2015-04-21 HISTORY — PX: CORONARY ARTERY BYPASS GRAFT: SHX141

## 2015-04-21 HISTORY — PX: TEE WITHOUT CARDIOVERSION: SHX5443

## 2015-04-21 HISTORY — DX: Presence of aortocoronary bypass graft: Z95.1

## 2015-04-21 LAB — POCT I-STAT, CHEM 8
BUN: 25 mg/dL — ABNORMAL HIGH (ref 6–20)
BUN: 21 mg/dL — ABNORMAL HIGH (ref 6–20)
BUN: 23 mg/dL — ABNORMAL HIGH (ref 6–20)
BUN: 20 mg/dL (ref 6–20)
BUN: 19 mg/dL (ref 6–20)
BUN: 18 mg/dL (ref 6–20)
Calcium, Ion: 1.21 mmol/L (ref 1.13–1.30)
Calcium, Ion: 1.01 mmol/L — ABNORMAL LOW (ref 1.13–1.30)
Calcium, Ion: 1.19 mmol/L (ref 1.13–1.30)
Calcium, Ion: 1.05 mmol/L — ABNORMAL LOW (ref 1.13–1.30)
Calcium, Ion: 1.17 mmol/L (ref 1.13–1.30)
Calcium, Ion: 1.12 mmol/L — ABNORMAL LOW (ref 1.13–1.30)
Chloride: 102 mmol/L (ref 101–111)
Chloride: 103 mmol/L (ref 101–111)
Chloride: 95 mmol/L — ABNORMAL LOW (ref 101–111)
Chloride: 98 mmol/L — ABNORMAL LOW (ref 101–111)
Chloride: 100 mmol/L — ABNORMAL LOW (ref 101–111)
Chloride: 105 mmol/L (ref 101–111)
Creatinine, Ser: 1.1 mg/dL (ref 0.61–1.24)
Creatinine, Ser: 0.9 mg/dL (ref 0.61–1.24)
Creatinine, Ser: 1.1 mg/dL (ref 0.61–1.24)
Creatinine, Ser: 1.1 mg/dL (ref 0.61–1.24)
Creatinine, Ser: 1.1 mg/dL (ref 0.61–1.24)
Creatinine, Ser: 1.2 mg/dL (ref 0.61–1.24)
Glucose, Bld: 232 mg/dL — ABNORMAL HIGH (ref 65–99)
Glucose, Bld: 191 mg/dL — ABNORMAL HIGH (ref 65–99)
Glucose, Bld: 224 mg/dL — ABNORMAL HIGH (ref 65–99)
Glucose, Bld: 185 mg/dL — ABNORMAL HIGH (ref 65–99)
Glucose, Bld: 183 mg/dL — ABNORMAL HIGH (ref 65–99)
Glucose, Bld: 185 mg/dL — ABNORMAL HIGH (ref 65–99)
HCT: 39 % (ref 39.0–52.0)
HCT: 29 % — ABNORMAL LOW (ref 39.0–52.0)
HCT: 35 % — ABNORMAL LOW (ref 39.0–52.0)
HCT: 28 % — ABNORMAL LOW (ref 39.0–52.0)
HCT: 27 % — ABNORMAL LOW (ref 39.0–52.0)
HCT: 30 % — ABNORMAL LOW (ref 39.0–52.0)
Hemoglobin: 13.3 g/dL (ref 13.0–17.0)
Hemoglobin: 11.9 g/dL — ABNORMAL LOW (ref 13.0–17.0)
Hemoglobin: 9.9 g/dL — ABNORMAL LOW (ref 13.0–17.0)
Hemoglobin: 9.5 g/dL — ABNORMAL LOW (ref 13.0–17.0)
Hemoglobin: 9.2 g/dL — ABNORMAL LOW (ref 13.0–17.0)
Hemoglobin: 10.2 g/dL — ABNORMAL LOW (ref 13.0–17.0)
Potassium: 4.4 mmol/L (ref 3.5–5.1)
Potassium: 4.1 mmol/L (ref 3.5–5.1)
Potassium: 4.4 mmol/L (ref 3.5–5.1)
Potassium: 4.9 mmol/L (ref 3.5–5.1)
Potassium: 3.7 mmol/L (ref 3.5–5.1)
Potassium: 4.9 mmol/L (ref 3.5–5.1)
Sodium: 138 mmol/L (ref 135–145)
Sodium: 134 mmol/L — ABNORMAL LOW (ref 135–145)
Sodium: 137 mmol/L (ref 135–145)
Sodium: 135 mmol/L (ref 135–145)
Sodium: 139 mmol/L (ref 135–145)
Sodium: 138 mmol/L (ref 135–145)
TCO2: 26 mmol/L (ref 0–100)
TCO2: 26 mmol/L (ref 0–100)
TCO2: 28 mmol/L (ref 0–100)
TCO2: 24 mmol/L (ref 0–100)
TCO2: 23 mmol/L (ref 0–100)
TCO2: 22 mmol/L (ref 0–100)

## 2015-04-21 LAB — POCT I-STAT 3, ART BLOOD GAS (G3+)
Acid-base deficit: 1 mmol/L (ref 0.0–2.0)
Acid-base deficit: 1 mmol/L (ref 0.0–2.0)
Acid-base deficit: 3 mmol/L — ABNORMAL HIGH (ref 0.0–2.0)
Acid-base deficit: 5 mmol/L — ABNORMAL HIGH (ref 0.0–2.0)
Bicarbonate: 23.9 mEq/L (ref 20.0–24.0)
Bicarbonate: 23.5 mEq/L (ref 20.0–24.0)
Bicarbonate: 22.8 mEq/L (ref 20.0–24.0)
Bicarbonate: 22.1 mEq/L (ref 20.0–24.0)
O2 Saturation: 100 %
O2 Saturation: 94 %
O2 Saturation: 97 %
O2 Saturation: 98 %
Patient temperature: 36.4
Patient temperature: 36.9
Patient temperature: 36.8
TCO2: 25 mmol/L (ref 0–100)
TCO2: 25 mmol/L (ref 0–100)
TCO2: 24 mmol/L (ref 0–100)
TCO2: 23 mmol/L (ref 0–100)
pCO2 arterial: 42.3 mmHg (ref 35.0–45.0)
pCO2 arterial: 38.5 mmHg (ref 35.0–45.0)
pCO2 arterial: 42.5 mmHg (ref 35.0–45.0)
pCO2 arterial: 46.2 mmHg — ABNORMAL HIGH (ref 35.0–45.0)
pH, Arterial: 7.361 (ref 7.350–7.450)
pH, Arterial: 7.392 (ref 7.350–7.450)
pH, Arterial: 7.337 — ABNORMAL LOW (ref 7.350–7.450)
pH, Arterial: 7.286 — ABNORMAL LOW (ref 7.350–7.450)
pO2, Arterial: 403 mmHg — ABNORMAL HIGH (ref 80.0–100.0)
pO2, Arterial: 68 mmHg — ABNORMAL LOW (ref 80.0–100.0)
pO2, Arterial: 93 mmHg (ref 80.0–100.0)
pO2, Arterial: 111 mmHg — ABNORMAL HIGH (ref 80.0–100.0)

## 2015-04-21 LAB — GLUCOSE, CAPILLARY
Glucose-Capillary: 234 mg/dL — ABNORMAL HIGH (ref 65–99)
Glucose-Capillary: 118 mg/dL — ABNORMAL HIGH (ref 65–99)
Glucose-Capillary: 117 mg/dL — ABNORMAL HIGH (ref 65–99)
Glucose-Capillary: 137 mg/dL — ABNORMAL HIGH (ref 65–99)
Glucose-Capillary: 131 mg/dL — ABNORMAL HIGH (ref 65–99)
Glucose-Capillary: 193 mg/dL — ABNORMAL HIGH (ref 65–99)
Glucose-Capillary: 187 mg/dL — ABNORMAL HIGH (ref 65–99)

## 2015-04-21 LAB — CBC
HCT: 27.9 % — ABNORMAL LOW (ref 39.0–52.0)
HEMATOCRIT: 35.8 % — AB (ref 39.0–52.0)
HEMATOCRIT: 29.6 % — AB (ref 39.0–52.0)
HEMOGLOBIN: 11.7 g/dL — AB (ref 13.0–17.0)
Hemoglobin: 9.6 g/dL — ABNORMAL LOW (ref 13.0–17.0)
Hemoglobin: 9.3 g/dL — ABNORMAL LOW (ref 13.0–17.0)
MCH: 19.8 pg — ABNORMAL LOW (ref 26.0–34.0)
MCH: 19.4 pg — AB (ref 26.0–34.0)
MCH: 19.9 pg — ABNORMAL LOW (ref 26.0–34.0)
MCHC: 32.7 g/dL (ref 30.0–36.0)
MCHC: 32.4 g/dL (ref 30.0–36.0)
MCHC: 33.3 g/dL (ref 30.0–36.0)
MCV: 60.6 fL — AB (ref 78.0–100.0)
MCV: 59.7 fL — AB (ref 78.0–100.0)
MCV: 59.6 fL — ABNORMAL LOW (ref 78.0–100.0)
PLATELETS: 68 10*3/uL — AB (ref 150–400)
Platelets: 96 10*3/uL — ABNORMAL LOW (ref 150–400)
Platelets: 63 10*3/uL — ABNORMAL LOW (ref 150–400)
RBC: 5.91 MIL/uL — AB (ref 4.22–5.81)
RBC: 4.96 MIL/uL (ref 4.22–5.81)
RBC: 4.68 MIL/uL (ref 4.22–5.81)
RDW: 16.5 % — AB (ref 11.5–15.5)
RDW: 15.9 % — AB (ref 11.5–15.5)
RDW: 16.2 % — ABNORMAL HIGH (ref 11.5–15.5)
WBC: 5.9 10*3/uL (ref 4.0–10.5)
WBC: 6.4 10*3/uL (ref 4.0–10.5)
WBC: 5.7 10*3/uL (ref 4.0–10.5)

## 2015-04-21 LAB — POCT I-STAT 4, (NA,K, GLUC, HGB,HCT)
Glucose, Bld: 141 mg/dL — ABNORMAL HIGH (ref 65–99)
HCT: 32 % — ABNORMAL LOW (ref 39.0–52.0)
Hemoglobin: 10.9 g/dL — ABNORMAL LOW (ref 13.0–17.0)
Potassium: 3.9 mmol/L (ref 3.5–5.1)
Sodium: 140 mmol/L (ref 135–145)

## 2015-04-21 LAB — BASIC METABOLIC PANEL
ANION GAP: 10 (ref 5–15)
BUN: 25 mg/dL — ABNORMAL HIGH (ref 6–20)
CHLORIDE: 101 mmol/L (ref 101–111)
CO2: 26 mmol/L (ref 22–32)
Calcium: 9.5 mg/dL (ref 8.9–10.3)
Creatinine, Ser: 1.66 mg/dL — ABNORMAL HIGH (ref 0.61–1.24)
GFR calc Af Amer: 47 mL/min — ABNORMAL LOW (ref >60)
GFR calc non Af Amer: 40 mL/min — ABNORMAL LOW (ref >60)
GLUCOSE: 206 mg/dL — AB (ref 65–99)
POTASSIUM: 4.7 mmol/L (ref 3.5–5.1)
Sodium: 137 mmol/L (ref 135–145)

## 2015-04-21 LAB — BLOOD GAS, ARTERIAL
Acid-base deficit: 0.6 mmol/L (ref 0.0–2.0)
Bicarbonate: 23.2 meq/L (ref 20.0–24.0)
Drawn by: 365271
FIO2: 0.21
O2 Saturation: 97.4 %
Patient temperature: 98.6
TCO2: 24.3 mmol/L (ref 0–100)
pCO2 arterial: 35.7 mmHg (ref 35.0–45.0)
pH, Arterial: 7.428 (ref 7.350–7.450)
pO2, Arterial: 101 mmHg — ABNORMAL HIGH (ref 80.0–100.0)

## 2015-04-21 LAB — CREATININE, SERUM
Creatinine, Ser: 1.43 mg/dL — ABNORMAL HIGH (ref 0.61–1.24)
GFR calc Af Amer: 56 mL/min — ABNORMAL LOW (ref >60)
GFR calc non Af Amer: 48 mL/min — ABNORMAL LOW (ref >60)

## 2015-04-21 LAB — MRSA PCR SCREENING: MRSA by PCR: NEGATIVE

## 2015-04-21 LAB — APTT
aPTT: 31 seconds (ref 24–37)
aPTT: 32 seconds (ref 24–37)

## 2015-04-21 LAB — PROTIME-INR
INR: 1.51 — ABNORMAL HIGH (ref 0.00–1.49)
Prothrombin Time: 18.3 seconds — ABNORMAL HIGH (ref 11.6–15.2)

## 2015-04-21 LAB — HEMOGLOBIN AND HEMATOCRIT, BLOOD
HCT: 26.1 % — ABNORMAL LOW (ref 39.0–52.0)
Hemoglobin: 8.4 g/dL — ABNORMAL LOW (ref 13.0–17.0)

## 2015-04-21 LAB — MAGNESIUM: Magnesium: 2.9 mg/dL — ABNORMAL HIGH (ref 1.7–2.4)

## 2015-04-21 LAB — PLATELET COUNT: Platelets: 72 10*3/uL — ABNORMAL LOW (ref 150–400)

## 2015-04-21 SURGERY — CORONARY ARTERY BYPASS GRAFTING (CABG)
Anesthesia: General | Site: Chest

## 2015-04-21 MED ORDER — DEXMEDETOMIDINE HCL IN NACL 200 MCG/50ML IV SOLN: 0.0000 ug/kg/h | INTRAVENOUS | Status: DC

## 2015-04-21 MED ORDER — 0.9 % SODIUM CHLORIDE (POUR BTL) OPTIME
TOPICAL | Status: DC | PRN
  Administered 2015-04-21: 5000 mL

## 2015-04-21 MED ORDER — HEPARIN SODIUM (PORCINE) 1000 UNIT/ML IJ SOLN
INTRAMUSCULAR | Status: AC
  Filled 2015-04-21: qty 1

## 2015-04-21 MED ORDER — SUCCINYLCHOLINE CHLORIDE 20 MG/ML IJ SOLN
INTRAMUSCULAR | Status: AC
  Filled 2015-04-21: qty 1

## 2015-04-21 MED ORDER — MIDAZOLAM HCL 5 MG/5ML IJ SOLN
INTRAMUSCULAR | Status: DC | PRN
  Administered 2015-04-21: 3 mg via INTRAVENOUS
  Administered 2015-04-21 (×3): 2 mg via INTRAVENOUS
  Administered 2015-04-21: 3 mg via INTRAVENOUS

## 2015-04-21 MED ORDER — CALCIUM CHLORIDE 10 % IV SOLN
INTRAVENOUS | Status: AC
  Filled 2015-04-21: qty 10

## 2015-04-21 MED ORDER — MORPHINE SULFATE (PF) 2 MG/ML IV SOLN
2.0000 mg | INTRAVENOUS | Status: DC | PRN
  Administered 2015-04-22 (×3): 2 mg via INTRAVENOUS
  Filled 2015-04-21 (×3): qty 1

## 2015-04-21 MED ORDER — LACTATED RINGERS IV SOLN: 500.0000 mL | Freq: Once | INTRAVENOUS | Status: DC | PRN

## 2015-04-21 MED ORDER — FENTANYL CITRATE (PF) 250 MCG/5ML IJ SOLN
INTRAMUSCULAR | Status: AC
  Filled 2015-04-21: qty 25

## 2015-04-21 MED ORDER — DOCUSATE SODIUM 100 MG PO CAPS
200.0000 mg | ORAL_CAPSULE | Freq: Every day | ORAL | Status: DC
  Administered 2015-04-22 – 2015-04-23 (×2): 200 mg via ORAL
  Filled 2015-04-21 (×2): qty 2

## 2015-04-21 MED ORDER — HEMOSTATIC AGENTS (NO CHARGE) OPTIME
TOPICAL | Status: DC | PRN
  Administered 2015-04-21 (×2): 1 via TOPICAL

## 2015-04-21 MED ORDER — DEXMEDETOMIDINE HCL IN NACL 400 MCG/100ML IV SOLN
0.1000 ug/kg/h | INTRAVENOUS | Status: DC
  Filled 2015-04-21: qty 100

## 2015-04-21 MED ORDER — MIDAZOLAM HCL 10 MG/2ML IJ SOLN
INTRAMUSCULAR | Status: AC
  Filled 2015-04-21: qty 2

## 2015-04-21 MED ORDER — METOPROLOL TARTRATE 25 MG/10 ML ORAL SUSPENSION: 12.5000 mg | Freq: Two times a day (BID) | ORAL | Status: DC

## 2015-04-21 MED ORDER — PHENYLEPHRINE HCL 10 MG/ML IJ SOLN
0.0000 ug/min | INTRAVENOUS | Status: DC
  Administered 2015-04-22: 5 ug/min via INTRAVENOUS
  Filled 2015-04-21 (×2): qty 2

## 2015-04-21 MED ORDER — LACTATED RINGERS IV SOLN: INTRAVENOUS | Status: DC

## 2015-04-21 MED ORDER — ACETAMINOPHEN 160 MG/5ML PO SOLN: 1000.0000 mg | Freq: Four times a day (QID) | ORAL | Status: DC

## 2015-04-21 MED ORDER — SODIUM CHLORIDE 0.9 % IV SOLN
250.0000 [IU] | INTRAVENOUS | Status: DC | PRN
  Administered 2015-04-21: 1 [IU]/h via INTRAVENOUS

## 2015-04-21 MED ORDER — VANCOMYCIN HCL IN DEXTROSE 1-5 GM/200ML-% IV SOLN
1000.0000 mg | Freq: Once | INTRAVENOUS | Status: AC
  Administered 2015-04-21: 1000 mg via INTRAVENOUS
  Filled 2015-04-21: qty 200

## 2015-04-21 MED ORDER — NITROGLYCERIN 0.2 MG/ML ON CALL CATH LAB
INTRAVENOUS | Status: DC | PRN
  Administered 2015-04-21 (×3): 40 ug via INTRAVENOUS

## 2015-04-21 MED ORDER — LACTATED RINGERS IV SOLN
INTRAVENOUS | Status: DC | PRN
  Administered 2015-04-21 (×2): via INTRAVENOUS

## 2015-04-21 MED ORDER — MIDAZOLAM HCL 2 MG/2ML IJ SOLN: 2.0000 mg | INTRAMUSCULAR | Status: DC | PRN

## 2015-04-21 MED ORDER — SODIUM CHLORIDE 0.9 % IV SOLN: 250.0000 mL | INTRAVENOUS | Status: DC

## 2015-04-21 MED ORDER — GLYCOPYRROLATE 0.2 MG/ML IJ SOLN
INTRAMUSCULAR | Status: DC | PRN
  Administered 2015-04-21: 0.4 mg via INTRAVENOUS

## 2015-04-21 MED ORDER — ONDANSETRON HCL 4 MG/2ML IJ SOLN: 4.0000 mg | Freq: Four times a day (QID) | INTRAMUSCULAR | Status: DC | PRN

## 2015-04-21 MED ORDER — ANTISEPTIC ORAL RINSE SOLUTION (CORINZ)
7.0000 mL | Freq: Four times a day (QID) | OROMUCOSAL | Status: DC
  Administered 2015-04-21 – 2015-04-22 (×5): 7 mL via OROMUCOSAL

## 2015-04-21 MED ORDER — CALCIUM CHLORIDE 10 % IV SOLN
INTRAVENOUS | Status: DC | PRN
  Administered 2015-04-21: .1 g via INTRAVENOUS

## 2015-04-21 MED ORDER — SODIUM CHLORIDE 0.9 % IV SOLN: INTRAVENOUS | Status: DC

## 2015-04-21 MED ORDER — FENTANYL CITRATE (PF) 250 MCG/5ML IJ SOLN
INTRAMUSCULAR | Status: AC
  Filled 2015-04-21: qty 5

## 2015-04-21 MED ORDER — LIDOCAINE HCL (CARDIAC) 20 MG/ML IV SOLN
INTRAVENOUS | Status: AC
  Filled 2015-04-21: qty 5

## 2015-04-21 MED ORDER — ROCURONIUM BROMIDE 50 MG/5ML IV SOLN
INTRAVENOUS | Status: AC
  Filled 2015-04-21: qty 1

## 2015-04-21 MED ORDER — ASPIRIN EC 325 MG PO TBEC
325.0000 mg | DELAYED_RELEASE_TABLET | Freq: Every day | ORAL | Status: DC
  Administered 2015-04-22: 325 mg via ORAL
  Filled 2015-04-21: qty 1

## 2015-04-21 MED ORDER — SODIUM CHLORIDE 0.9 % IV SOLN
10.0000 g | INTRAVENOUS | Status: DC | PRN
  Administered 2015-04-21: 5 g/h via INTRAVENOUS

## 2015-04-21 MED ORDER — LIDOCAINE HCL (CARDIAC) 20 MG/ML IV SOLN
INTRAVENOUS | Status: DC | PRN
  Administered 2015-04-21: 60 mg via INTRAVENOUS

## 2015-04-21 MED ORDER — CHLORHEXIDINE GLUCONATE 0.12% ORAL RINSE (MEDLINE KIT)
15.0000 mL | Freq: Two times a day (BID) | OROMUCOSAL | Status: DC
  Administered 2015-04-21 – 2015-04-22 (×2): 15 mL via OROMUCOSAL

## 2015-04-21 MED ORDER — TRAMADOL HCL 50 MG PO TABS
50.0000 mg | ORAL_TABLET | ORAL | Status: DC | PRN
  Administered 2015-04-22: 100 mg via ORAL
  Filled 2015-04-21: qty 2

## 2015-04-21 MED ORDER — OXYCODONE HCL 5 MG PO TABS
5.0000 mg | ORAL_TABLET | ORAL | Status: DC | PRN
  Administered 2015-04-22 – 2015-04-23 (×3): 10 mg via ORAL
  Filled 2015-04-21 (×3): qty 2

## 2015-04-21 MED ORDER — SODIUM CHLORIDE 0.45 % IV SOLN
INTRAVENOUS | Status: DC | PRN
  Administered 2015-04-21: 14:00:00 via INTRAVENOUS

## 2015-04-21 MED ORDER — SODIUM CHLORIDE 0.9 % IV SOLN
INTRAVENOUS | Status: DC
  Administered 2015-04-21: 3 [IU]/h via INTRAVENOUS
  Filled 2015-04-21 (×2): qty 2.5

## 2015-04-21 MED ORDER — PROPOFOL 10 MG/ML IV BOLUS
INTRAVENOUS | Status: DC | PRN
  Administered 2015-04-21: 100 mg via INTRAVENOUS

## 2015-04-21 MED ORDER — PHENYLEPHRINE HCL 10 MG/ML IJ SOLN
10.0000 mg | INTRAVENOUS | Status: DC | PRN
  Administered 2015-04-21: 10 ug/min via INTRAVENOUS

## 2015-04-21 MED ORDER — PROTAMINE SULFATE 10 MG/ML IV SOLN
INTRAVENOUS | Status: DC | PRN
  Administered 2015-04-21: 50 mg via INTRAVENOUS
  Administered 2015-04-21: 20 mg via INTRAVENOUS
  Administered 2015-04-21: 30 mg via INTRAVENOUS
  Administered 2015-04-21 (×2): 50 mg via INTRAVENOUS
  Administered 2015-04-21: 20 mg via INTRAVENOUS
  Administered 2015-04-21: 30 mg via INTRAVENOUS
  Administered 2015-04-21: 50 mg via INTRAVENOUS

## 2015-04-21 MED ORDER — ACETAMINOPHEN 160 MG/5ML PO SOLN: 650.0000 mg | Freq: Once | ORAL | Status: AC

## 2015-04-21 MED ORDER — CEFUROXIME SODIUM 1.5 G IJ SOLR
50.0000 g | INTRAMUSCULAR | Status: DC | PRN
  Administered 2015-04-21: 1.5 g via INTRAVENOUS
  Administered 2015-04-21: .75 g via INTRAVENOUS

## 2015-04-21 MED ORDER — MAGNESIUM SULFATE 4 GM/100ML IV SOLN
4.0000 g | Freq: Once | INTRAVENOUS | Status: AC
  Administered 2015-04-21: 4 g via INTRAVENOUS
  Filled 2015-04-21: qty 100

## 2015-04-21 MED ORDER — PROTAMINE SULFATE 10 MG/ML IV SOLN
INTRAVENOUS | Status: AC
Start: 2015-04-21 — End: 2015-04-21
  Filled 2015-04-21: qty 50

## 2015-04-21 MED ORDER — SODIUM CHLORIDE 0.9 % IJ SOLN
INTRAMUSCULAR | Status: DC | PRN
  Administered 2015-04-21: 10:00:00 via TOPICAL
  Administered 2015-04-21: 8 mL via TOPICAL

## 2015-04-21 MED ORDER — MORPHINE SULFATE (PF) 2 MG/ML IV SOLN: 1.0000 mg | INTRAVENOUS | Status: DC | PRN

## 2015-04-21 MED ORDER — ASPIRIN 81 MG PO CHEW: 324.0000 mg | CHEWABLE_TABLET | Freq: Every day | ORAL | Status: DC

## 2015-04-21 MED ORDER — CHLORHEXIDINE GLUCONATE 0.12 % MT SOLN
15.0000 mL | OROMUCOSAL | Status: AC
  Administered 2015-04-21: 15 mL via OROMUCOSAL

## 2015-04-21 MED ORDER — SODIUM CHLORIDE 0.9 % IJ SOLN
3.0000 mL | Freq: Two times a day (BID) | INTRAMUSCULAR | Status: DC
  Administered 2015-04-22: 10 mL via INTRAVENOUS
  Administered 2015-04-22 – 2015-04-23 (×2): 3 mL via INTRAVENOUS

## 2015-04-21 MED ORDER — ACETAMINOPHEN 500 MG PO TABS
1000.0000 mg | ORAL_TABLET | Freq: Four times a day (QID) | ORAL | Status: DC
  Administered 2015-04-22 – 2015-04-24 (×9): 1000 mg via ORAL
  Filled 2015-04-21 (×10): qty 2

## 2015-04-21 MED ORDER — ROCURONIUM BROMIDE 100 MG/10ML IV SOLN
INTRAVENOUS | Status: DC | PRN
  Administered 2015-04-21 (×5): 50 mg via INTRAVENOUS

## 2015-04-21 MED ORDER — DEXTROSE 5 % IV SOLN
1.5000 g | Freq: Two times a day (BID) | INTRAVENOUS | Status: AC
  Administered 2015-04-21 – 2015-04-23 (×4): 1.5 g via INTRAVENOUS
  Filled 2015-04-21 (×5): qty 1.5

## 2015-04-21 MED ORDER — DEXTROSE 5 % IV SOLN
20.0000 mg | INTRAVENOUS | Status: DC | PRN
  Administered 2015-04-21: 10 ug/min via INTRAVENOUS

## 2015-04-21 MED ORDER — POTASSIUM CHLORIDE 10 MEQ/50ML IV SOLN
10.0000 meq | INTRAVENOUS | Status: AC
  Administered 2015-04-21 (×3): 10 meq via INTRAVENOUS

## 2015-04-21 MED ORDER — NITROGLYCERIN IN D5W 200-5 MCG/ML-% IV SOLN: 0.0000 ug/min | INTRAVENOUS | Status: DC

## 2015-04-21 MED ORDER — ALBUMIN HUMAN 5 % IV SOLN
INTRAVENOUS | Status: DC | PRN
  Administered 2015-04-21 (×2): via INTRAVENOUS

## 2015-04-21 MED ORDER — HEPARIN SODIUM (PORCINE) 1000 UNIT/ML IJ SOLN
INTRAMUSCULAR | Status: DC | PRN
  Administered 2015-04-21: 40000 [IU] via INTRAVENOUS

## 2015-04-21 MED ORDER — PANTOPRAZOLE SODIUM 40 MG PO TBEC
40.0000 mg | DELAYED_RELEASE_TABLET | Freq: Every day | ORAL | Status: DC
  Administered 2015-04-23: 40 mg via ORAL
  Filled 2015-04-21: qty 1

## 2015-04-21 MED ORDER — PROPOFOL 10 MG/ML IV BOLUS
INTRAVENOUS | Status: AC
  Filled 2015-04-21: qty 20

## 2015-04-21 MED ORDER — ALBUMIN HUMAN 5 % IV SOLN
250.0000 mL | INTRAVENOUS | Status: AC | PRN
  Administered 2015-04-21 (×3): 250 mL via INTRAVENOUS
  Filled 2015-04-21: qty 250

## 2015-04-21 MED ORDER — INSULIN REGULAR BOLUS VIA INFUSION
0.0000 [IU] | Freq: Three times a day (TID) | INTRAVENOUS | Status: DC
  Administered 2015-04-22: 2.9 [IU]/h via INTRAVENOUS
  Administered 2015-04-22: 6.5 [IU]/h via INTRAVENOUS
  Administered 2015-04-22: 4 [IU] via INTRAVENOUS
  Administered 2015-04-22: 7.1 [IU]/h via INTRAVENOUS
  Administered 2015-04-22: 6 [IU] via INTRAVENOUS
  Filled 2015-04-21: qty 10

## 2015-04-21 MED ORDER — MIDAZOLAM HCL 2 MG/2ML IJ SOLN
INTRAMUSCULAR | Status: AC
  Filled 2015-04-21: qty 2

## 2015-04-21 MED ORDER — SODIUM CHLORIDE 0.9 % IJ SOLN: 3.0000 mL | INTRAMUSCULAR | Status: DC | PRN

## 2015-04-21 MED ORDER — BISACODYL 10 MG RE SUPP
10.0000 mg | Freq: Every day | RECTAL | Status: DC
Start: 2015-04-22 — End: 2015-04-24

## 2015-04-21 MED ORDER — BISACODYL 5 MG PO TBEC
10.0000 mg | DELAYED_RELEASE_TABLET | Freq: Every day | ORAL | Status: DC
  Administered 2015-04-22 – 2015-04-23 (×2): 10 mg via ORAL
  Filled 2015-04-21 (×2): qty 2

## 2015-04-21 MED ORDER — METOPROLOL TARTRATE 1 MG/ML IV SOLN
2.5000 mg | INTRAVENOUS | Status: DC | PRN
  Filled 2015-04-21: qty 5

## 2015-04-21 MED ORDER — ACETAMINOPHEN 650 MG RE SUPP
650.0000 mg | Freq: Once | RECTAL | Status: AC
  Administered 2015-04-21: 650 mg via RECTAL

## 2015-04-21 MED ORDER — LACTATED RINGERS IV SOLN
INTRAVENOUS | Status: DC
  Administered 2015-04-21: 22:00:00 via INTRAVENOUS

## 2015-04-21 MED ORDER — CALCIUM CHLORIDE 10 % IV SOLN: INTRAVENOUS | Status: DC | PRN

## 2015-04-21 MED ORDER — FAMOTIDINE IN NACL 20-0.9 MG/50ML-% IV SOLN
20.0000 mg | Freq: Two times a day (BID) | INTRAVENOUS | Status: DC
  Administered 2015-04-21: 20 mg via INTRAVENOUS

## 2015-04-21 MED ORDER — FENTANYL CITRATE (PF) 100 MCG/2ML IJ SOLN
INTRAMUSCULAR | Status: DC | PRN
  Administered 2015-04-21: 500 ug via INTRAVENOUS
  Administered 2015-04-21: 100 ug via INTRAVENOUS
  Administered 2015-04-21 (×2): 150 ug via INTRAVENOUS
  Administered 2015-04-21: 100 ug via INTRAVENOUS
  Administered 2015-04-21: 150 ug via INTRAVENOUS
  Administered 2015-04-21 (×2): 250 ug via INTRAVENOUS
  Administered 2015-04-21: 100 ug via INTRAVENOUS

## 2015-04-21 MED ORDER — PHENYLEPHRINE 40 MCG/ML (10ML) SYRINGE FOR IV PUSH (FOR BLOOD PRESSURE SUPPORT)
PREFILLED_SYRINGE | INTRAVENOUS | Status: AC
  Filled 2015-04-21: qty 10

## 2015-04-21 MED ORDER — METOPROLOL TARTRATE 12.5 MG HALF TABLET
12.5000 mg | ORAL_TABLET | Freq: Two times a day (BID) | ORAL | Status: DC
  Administered 2015-04-22 – 2015-04-23 (×3): 12.5 mg via ORAL
  Filled 2015-04-21 (×3): qty 1

## 2015-04-21 MED FILL — Electrolyte-R (PH 7.4) Solution: INTRAVENOUS | Qty: 4000 | Status: AC

## 2015-04-21 MED FILL — Sodium Bicarbonate IV Soln 8.4%: INTRAVENOUS | Qty: 50 | Status: AC

## 2015-04-21 MED FILL — Heparin Sodium (Porcine) Inj 1000 Unit/ML: INTRAMUSCULAR | Qty: 10 | Status: AC

## 2015-04-21 MED FILL — Lidocaine HCl IV Inj 20 MG/ML: INTRAVENOUS | Qty: 5 | Status: AC

## 2015-04-21 MED FILL — Mannitol IV Soln 20%: INTRAVENOUS | Qty: 500 | Status: AC

## 2015-04-21 MED FILL — Sodium Chloride IV Soln 0.9%: INTRAVENOUS | Qty: 2000 | Status: AC

## 2015-04-21 SURGICAL SUPPLY — 84 items
BAG DECANTER FOR FLEXI CONT (MISCELLANEOUS) ×3 IMPLANT
BANDAGE ACE 4X5 VEL STRL LF (GAUZE/BANDAGES/DRESSINGS) ×3 IMPLANT
BANDAGE ACE 6X5 VEL STRL LF (GAUZE/BANDAGES/DRESSINGS) ×3 IMPLANT
BANDAGE ELASTIC 4 VELCRO ST LF (GAUZE/BANDAGES/DRESSINGS) ×3 IMPLANT
BANDAGE ELASTIC 6 VELCRO ST LF (GAUZE/BANDAGES/DRESSINGS) ×3 IMPLANT
BLADE STERNUM SYSTEM 6 (BLADE) ×3 IMPLANT
BNDG GAUZE ELAST 4 BULKY (GAUZE/BANDAGES/DRESSINGS) ×3 IMPLANT
CANISTER SUCTION 2500CC (MISCELLANEOUS) ×3 IMPLANT
CATH CPB KIT GERHARDT (MISCELLANEOUS) ×3 IMPLANT
CATH THORACIC 28FR (CATHETERS) ×3 IMPLANT
CRADLE DONUT ADULT HEAD (MISCELLANEOUS) ×3 IMPLANT
DRAIN CHANNEL 28F RND 3/8 FF (WOUND CARE) ×3 IMPLANT
DRAPE CARDIOVASCULAR INCISE (DRAPES) ×1
DRAPE SLUSH/WARMER DISC (DRAPES) ×3 IMPLANT
DRAPE SRG 135X102X78XABS (DRAPES) ×2 IMPLANT
DRSG AQUACEL AG ADV 3.5X14 (GAUZE/BANDAGES/DRESSINGS) ×3 IMPLANT
ELECT BLADE 4.0 EZ CLEAN MEGAD (MISCELLANEOUS) ×3
ELECT REM PT RETURN 9FT ADLT (ELECTROSURGICAL) ×6
ELECTRODE BLDE 4.0 EZ CLN MEGD (MISCELLANEOUS) ×2 IMPLANT
ELECTRODE REM PT RTRN 9FT ADLT (ELECTROSURGICAL) ×4 IMPLANT
GAUZE SPONGE 4X4 12PLY STRL (GAUZE/BANDAGES/DRESSINGS) ×6 IMPLANT
GLOVE BIO SURGEON STRL SZ 6 (GLOVE) ×9 IMPLANT
GLOVE BIO SURGEON STRL SZ 6.5 (GLOVE) ×21 IMPLANT
GLOVE BIO SURGEON STRL SZ7.5 (GLOVE) ×9 IMPLANT
GOWN STRL REUS W/ TWL LRG LVL3 (GOWN DISPOSABLE) ×8 IMPLANT
GOWN STRL REUS W/TWL LRG LVL3 (GOWN DISPOSABLE) ×4
HEMOSTAT POWDER SURGIFOAM 1G (HEMOSTASIS) ×9 IMPLANT
HEMOSTAT SURGICEL 2X14 (HEMOSTASIS) ×3 IMPLANT
KIT BASIN OR (CUSTOM PROCEDURE TRAY) ×3 IMPLANT
KIT CATH SUCT 8FR (CATHETERS) ×3 IMPLANT
KIT ROOM TURNOVER OR (KITS) ×3 IMPLANT
KIT SUCTION CATH 14FR (SUCTIONS) ×6 IMPLANT
KIT VASOVIEW W/TROCAR VH 2000 (KITS) ×3 IMPLANT
LEAD PACING MYOCARDI (MISCELLANEOUS) ×3 IMPLANT
MARKER GRAFT CORONARY BYPASS (MISCELLANEOUS) ×9 IMPLANT
MARKER SKIN DUAL TIP RULER LAB (MISCELLANEOUS) ×3 IMPLANT
NS IRRIG 1000ML POUR BTL (IV SOLUTION) ×15 IMPLANT
PACK OPEN HEART (CUSTOM PROCEDURE TRAY) ×3 IMPLANT
PAD ARMBOARD 7.5X6 YLW CONV (MISCELLANEOUS) ×6 IMPLANT
PAD ELECT DEFIB RADIOL ZOLL (MISCELLANEOUS) ×3 IMPLANT
PENCIL BUTTON HOLSTER BLD 10FT (ELECTRODE) ×3 IMPLANT
PUNCH AORTIC ROT 4.0MM RCL 40 (MISCELLANEOUS) ×3 IMPLANT
PUNCH AORTIC ROTATE  4.5MM 8IN (MISCELLANEOUS) ×3 IMPLANT
SET CARDIOPLEGIA MPS 5001102 (MISCELLANEOUS) ×3 IMPLANT
SOLUTION ANTI FOG 6CC (MISCELLANEOUS) ×3 IMPLANT
SPONGE LAP 18X18 X RAY DECT (DISPOSABLE) ×6 IMPLANT
SPONGE LAP 4X18 X RAY DECT (DISPOSABLE) ×3 IMPLANT
SUT BONE WAX W31G (SUTURE) ×3 IMPLANT
SUT ETHIBOND 2 0 SH (SUTURE) ×4
SUT ETHIBOND 2 0 SH 36X2 (SUTURE) ×8 IMPLANT
SUT PROLENE 3 0 SH1 36 (SUTURE) ×3 IMPLANT
SUT PROLENE 4 0 RB 1 (SUTURE) ×1
SUT PROLENE 4 0 TF (SUTURE) ×6 IMPLANT
SUT PROLENE 4-0 RB1 .5 CRCL 36 (SUTURE) ×2 IMPLANT
SUT PROLENE 5 0 C 1 36 (SUTURE) ×6 IMPLANT
SUT PROLENE 6 0 C 1 30 (SUTURE) ×9 IMPLANT
SUT PROLENE 6 0 CC (SUTURE) ×18 IMPLANT
SUT PROLENE 7 0 BV1 MDA (SUTURE) ×9 IMPLANT
SUT PROLENE 8 0 BV175 6 (SUTURE) ×6 IMPLANT
SUT SILK  1 MH (SUTURE) ×3
SUT SILK 1 MH (SUTURE) ×6 IMPLANT
SUT SILK 1 TIES 10X30 (SUTURE) ×3 IMPLANT
SUT SILK 2 0 SH CR/8 (SUTURE) ×6 IMPLANT
SUT SILK 2 0 TIES 10X30 (SUTURE) ×3 IMPLANT
SUT SILK 2 0 TIES 17X18 (SUTURE) ×1
SUT SILK 2-0 18XBRD TIE BLK (SUTURE) ×2 IMPLANT
SUT SILK 3 0 SH CR/8 (SUTURE) ×3 IMPLANT
SUT SILK 4 0 TIE 10X30 (SUTURE) ×6 IMPLANT
SUT STEEL 6MS V (SUTURE) ×3 IMPLANT
SUT STEEL SZ 6 DBL 3X14 BALL (SUTURE) ×3 IMPLANT
SUT TEM PAC WIRE 2 0 SH (SUTURE) ×6 IMPLANT
SUT VIC AB 1 CTX 18 (SUTURE) ×6 IMPLANT
SUT VIC AB 2-0 CTX 27 (SUTURE) ×6 IMPLANT
SUT VIC AB 3-0 X1 27 (SUTURE) ×6 IMPLANT
SUTURE E-PAK OPEN HEART (SUTURE) ×3 IMPLANT
SYSTEM SAHARA CHEST DRAIN ATS (WOUND CARE) ×3 IMPLANT
TAPE CLOTH SURG 4X10 WHT LF (GAUZE/BANDAGES/DRESSINGS) ×3 IMPLANT
TAPE PAPER 3X10 WHT MICROPORE (GAUZE/BANDAGES/DRESSINGS) ×3 IMPLANT
TOWEL OR 17X24 6PK STRL BLUE (TOWEL DISPOSABLE) ×6 IMPLANT
TOWEL OR 17X26 10 PK STRL BLUE (TOWEL DISPOSABLE) ×6 IMPLANT
TRAY FOLEY IC TEMP SENS 16FR (CATHETERS) ×3 IMPLANT
TUBING INSUFFLATION (TUBING) ×3 IMPLANT
UNDERPAD 30X30 INCONTINENT (UNDERPADS AND DIAPERS) ×3 IMPLANT
WATER STERILE IRR 1000ML POUR (IV SOLUTION) ×6 IMPLANT

## 2015-04-21 NOTE — Progress Notes (Signed)
  Echocardiogram Echocardiogram Transesophageal has been performed.  [name] M 04/21/2015, 8:32 AM

## 2015-04-21 NOTE — Brief Op Note (Signed)
04/19/2015 - 04/21/2015  11:40 AM      [address]       [place] [address]             [phone]     04/19/2015 - 04/21/2015  11:41 AM  PATIENT:  [name]  70 y.o. male  PRE-OPERATIVE DIAGNOSIS:  CAD  POST-OPERATIVE DIAGNOSIS:  CAD  PROCEDURE:  Procedure(s): CORONARY ARTERY BYPASS GRAFTING (CABG) X 4 UTILIZING THE LEFT INTERNAL MAMMARY ARTERY TO LAD, ENDOSCOPICALLY HARVESTED RIGHT SAPHENEOUS VEINS TO PD,DIAGONAL AND CIRCUMFLEX. TRANSESOPHAGEAL ECHOCARDIOGRAM (TEE)  SURGEON:  Surgeon(s): Grace Isaac, MD  PHYSICIAN ASSISTANT: WAYNE GOLD PA-C  ANESTHESIA:   general  PATIENT CONDITION:  ICU - intubated and hemodynamically stable.  PRE-OPERATIVE WEIGHT: 0000000  COMPLICATIONS: NO KNOWN

## 2015-04-21 NOTE — Anesthesia Preprocedure Evaluation (Addendum)
Anesthesia Evaluation  Patient identified by MRN, date of birth, ID band Patient awake    Reviewed: Allergy & Precautions, NPO status , Patient's Chart, lab work & pertinent test results  History of Anesthesia Complications Negative for: history of anesthetic complications  Airway Mallampati: II  TM Distance: >3 FB Neck ROM: Full    Dental  (+) Teeth Intact, Dental Advisory Given   Pulmonary neg pulmonary ROS,    Pulmonary exam normal breath sounds clear to auscultation       Cardiovascular hypertension, Pt. on medications and Pt. on home beta blockers + angina with exertion + CAD  (-) CHF Normal cardiovascular exam Rhythm:Regular     Neuro/Psych negative neurological ROS  negative psych ROS   GI/Hepatic negative GI ROS, Neg liver ROS,   Endo/Other  diabetes, Poorly Controlled, Type 2, Oral Hypoglycemic Agents  Renal/GU      Musculoskeletal   Abdominal   Peds  Hematology   Anesthesia Other Findings     Dist Cx lesion, 80% stenosed. The lesion was not previously treated.     2nd Mrg lesion, 99% stenosed. The lesion was not previously treated.     1st Diag-1 lesion, 70% stenosed. The lesion was not previously treated.     1st Diag-2 lesion, 99% stenosed. The lesion was not previously treated.     Mid LAD lesion, 60% stenosed. The lesion was not previously treated.     Mid LAD to Dist LAD lesion, 70% stenosed. The lesion was not previously treated.     Dist LAD lesion, 75% stenosed. The lesion was not previously treated.     Prox RCA lesion, 100% stenosed. The lesion was not previously treated.     normal overall left ventricular function ejection fraction of 55%     suggest coronary bypass surgery for multivessel coronary disease  Reproductive/Obstetrics                          Anesthesia Physical Anesthesia Plan  ASA: IV  Anesthesia Plan: General   Post-op Pain Management:     Induction: Intravenous  Airway Management Planned: Oral ETT  Additional Equipment: Arterial line, CVP, PA Cath, TEE and Ultrasound Guidance Line Placement  Intra-op Plan:   Post-operative Plan: Post-operative intubation/ventilation  Informed Consent: I have reviewed the patients History and Physical, chart, labs and discussed the procedure including the risks, benefits and alternatives for the proposed anesthesia with the patient or authorized representative who has indicated his/her understanding and acceptance.   Dental advisory given  Plan Discussed with: CRNA, Anesthesiologist and Surgeon  Anesthesia Plan Comments:        Anesthesia Quick Evaluation

## 2015-04-21 NOTE — Procedures (Signed)
Extubation Procedure Note  Patient Details:   Name: [name] DOB: 17-Sep-1945 MRN: [id number]   Airway Documentation:     Evaluation  O2 sats: stable throughout Complications: No apparent complications Patient did tolerate procedure well. Bilateral Breath Sounds: Clear   Yes  Patient tolerated rapid wean. NIF -26 and VC .65 L. Positive for cuff leak. Patient extubated to a 4Lpm nasal cannula. No signs of dyspnea or stridor. Patient instructed on the Incentive Spirometer, achieving 850 mL three times. Patient resting comfortably.   [name] 04/21/2015, 6:43 PM

## 2015-04-21 NOTE — Anesthesia Postprocedure Evaluation (Signed)
Anesthesia Post Note  Patient: ANAS KEYE  Procedure(s) Performed: Procedure(s) (LRB): CORONARY ARTERY BYPASS GRAFTING (CABG) X 4 UTILIZING THE LEFT INTERNAL MAMMARY ARTERY TO LAD, ENDOSCOPICALLY HARVESTED RIGHT SAPHENEOUS VEINS TO PD,DIAGONAL AND CIRCUMFLEX. (N/A) TRANSESOPHAGEAL ECHOCARDIOGRAM (TEE) (N/A)  Patient location during evaluation: SICU Anesthesia Type: General Level of consciousness: sedated and patient remains intubated per anesthesia plan Pain management: pain level controlled Vital Signs Assessment: post-procedure vital signs reviewed and stable Respiratory status: patient on ventilator - see flowsheet for VS and patient remains intubated per anesthesia plan Cardiovascular status: blood pressure returned to baseline and stable Anesthetic complications: no    Last Vitals:  Filed Vitals:   04/21/15 1515 04/21/15 1555  BP: 96/70 115/77  Pulse: 89 64  Temp: 36.2 C   Resp: 12 12    Last Pain: There were no vitals filed for this visit.               Evona Westra EDWARD

## 2015-04-21 NOTE — Transfer of Care (Signed)
Immediate Anesthesia Transfer of Care Note  Patient: [name]  Procedure(s) Performed: Procedure(s): CORONARY ARTERY BYPASS GRAFTING (CABG) X 4 UTILIZING THE LEFT INTERNAL MAMMARY ARTERY TO LAD, ENDOSCOPICALLY HARVESTED RIGHT SAPHENEOUS VEINS TO PD,DIAGONAL AND CIRCUMFLEX. (N/A) TRANSESOPHAGEAL ECHOCARDIOGRAM (TEE) (N/A)  Patient Location: SICU  Anesthesia Type:General  Level of Consciousness: Patient remains intubated per anesthesia plan  Airway & Oxygen Therapy: Patient remains intubated per anesthesia plan  Post-op Assessment: Post -op Vital signs reviewed and stable  Post vital signs: stable  Last Vitals:  Filed Vitals:   04/21/15 1409 04/21/15 1413  BP: 171/97 104/67  Pulse: 79 80  Temp: 36.4 C 36.3 C  Resp: 16 13    Complications: No apparent anesthesia complications

## 2015-04-21 NOTE — Progress Notes (Signed)
ABG not resulting into EPIC, results given to RN.

## 2015-04-21 NOTE — Anesthesia Procedure Notes (Addendum)
Central Venous Catheter Insertion Performed by: anesthesiologist Patient location: Pre-op. Preanesthetic checklist: patient identified, IV checked, site marked, risks and benefits discussed, surgical consent, monitors and equipment checked, pre-op evaluation, timeout performed and anesthesia consent Position: supine Landmarks identified PA cath was placed.Swan type and PA catheter depth:thermodilation and 48PA Cath depth:48 Procedure performed using ultrasound guided technique. Attempts: 1 Patient tolerated the procedure well with no immediate complications.    Central Venous Catheter Insertion Performed by: anesthesiologist Patient location: Pre-op. Preanesthetic checklist: patient identified, IV checked, site marked, risks and benefits discussed, surgical consent, monitors and equipment checked, pre-op evaluation, timeout performed and anesthesia consent Position: Trendelenburg Lidocaine 1% used for infiltration Landmarks identified Catheter size: 8.5 Fr Central line was placed.Sheath introducer Procedure performed using ultrasound guided technique. Attempts: 1 Following insertion, line sutured and dressing applied. Post procedure assessment: blood return through all ports, free fluid flow and no air. Patient tolerated the procedure well with no immediate complications.    Procedure Name: Intubation Date/Time: 04/21/2015 7:52 AM Performed by: Lavell Luster Pre-anesthesia Checklist: Patient identified, Emergency Drugs available, Suction available, Patient being monitored and Timeout performed Patient Re-evaluated:Patient Re-evaluated prior to inductionOxygen Delivery Method: Circle system utilized Preoxygenation: Pre-oxygenation with 100% oxygen Intubation Type: IV induction Ventilation: Mask ventilation without difficulty Laryngoscope Size: Mac and 4 Grade View: Grade I Tube type: Oral Tube size: 8.0 mm Number of attempts: 1 Airway Equipment and Method: Stylet Placement  Confirmation: ETT inserted through vocal cords under direct vision,  positive ETCO2 and breath sounds checked- equal and bilateral Secured at: 22 cm Tube secured with: Tape Dental Injury: Teeth and Oropharynx as per pre-operative assessment

## 2015-04-21 NOTE — OR Nursing (Signed)
SICU Calls: First call at 12:33noon; Second call at 12:50noon.      Marykay Lex Shirell Struthers,RN

## 2015-04-21 NOTE — Progress Notes (Signed)
  Echocardiogram 2D Echocardiogram has been performed.  Darlina Sicilian M 04/21/2015, 8:33 AM

## 2015-04-21 NOTE — Anesthesia Postprocedure Evaluation (Signed)
Anesthesia Post Note  Patient: [name]  Procedure(s) Performed: Procedure(s) (LRB): CORONARY ARTERY BYPASS GRAFTING (CABG) X 4 UTILIZING THE LEFT INTERNAL MAMMARY ARTERY TO LAD, ENDOSCOPICALLY HARVESTED RIGHT SAPHENEOUS VEINS TO PD,DIAGONAL AND CIRCUMFLEX. (N/A) TRANSESOPHAGEAL ECHOCARDIOGRAM (TEE) (N/A)  Anesthesia Post Evaluation  Last Vitals:  Filed Vitals:   04/21/15 1409 04/21/15 1413  BP: 171/97 104/67  Pulse: 79 80  Temp: 36.4 C 36.3 C  Resp: 16 13    Last Pain: There were no vitals filed for this visit.               Lavell Luster

## 2015-04-22 ENCOUNTER — Inpatient Hospital Stay (HOSPITAL_COMMUNITY): Payer: Medicare Other

## 2015-04-22 LAB — CBC
HCT: 28.4 % — ABNORMAL LOW (ref 39.0–52.0)
HEMATOCRIT: 28.2 % — AB (ref 39.0–52.0)
HEMOGLOBIN: 9.2 g/dL — AB (ref 13.0–17.0)
HEMOGLOBIN: 9.4 g/dL — AB (ref 13.0–17.0)
MCH: 19.7 pg — AB (ref 26.0–34.0)
MCH: 19.6 pg — AB (ref 26.0–34.0)
MCHC: 32.6 g/dL (ref 30.0–36.0)
MCHC: 33.1 g/dL (ref 30.0–36.0)
MCV: 60.3 fL — ABNORMAL LOW (ref 78.0–100.0)
MCV: 59.3 fL — ABNORMAL LOW (ref 78.0–100.0)
Platelets: 87 10*3/uL — ABNORMAL LOW (ref 150–400)
Platelets: 75 10*3/uL — ABNORMAL LOW (ref 150–400)
RBC: 4.68 MIL/uL (ref 4.22–5.81)
RBC: 4.79 MIL/uL (ref 4.22–5.81)
RDW: 16.5 % — ABNORMAL HIGH (ref 11.5–15.5)
RDW: 16.4 % — AB (ref 11.5–15.5)
WBC: 7.5 10*3/uL (ref 4.0–10.5)
WBC: 8.5 10*3/uL (ref 4.0–10.5)

## 2015-04-22 LAB — GLUCOSE, CAPILLARY
Glucose-Capillary: 108 mg/dL — ABNORMAL HIGH (ref 65–99)
Glucose-Capillary: 111 mg/dL — ABNORMAL HIGH (ref 65–99)
Glucose-Capillary: 117 mg/dL — ABNORMAL HIGH (ref 65–99)
Glucose-Capillary: 120 mg/dL — ABNORMAL HIGH (ref 65–99)
Glucose-Capillary: 121 mg/dL — ABNORMAL HIGH (ref 65–99)
Glucose-Capillary: 124 mg/dL — ABNORMAL HIGH (ref 65–99)
Glucose-Capillary: 125 mg/dL — ABNORMAL HIGH (ref 65–99)
Glucose-Capillary: 136 mg/dL — ABNORMAL HIGH (ref 65–99)
Glucose-Capillary: 149 mg/dL — ABNORMAL HIGH (ref 65–99)
Glucose-Capillary: 149 mg/dL — ABNORMAL HIGH (ref 65–99)
Glucose-Capillary: 150 mg/dL — ABNORMAL HIGH (ref 65–99)
Glucose-Capillary: 154 mg/dL — ABNORMAL HIGH (ref 65–99)
Glucose-Capillary: 155 mg/dL — ABNORMAL HIGH (ref 65–99)
Glucose-Capillary: 160 mg/dL — ABNORMAL HIGH (ref 65–99)
Glucose-Capillary: 163 mg/dL — ABNORMAL HIGH (ref 65–99)
Glucose-Capillary: 163 mg/dL — ABNORMAL HIGH (ref 65–99)
Glucose-Capillary: 169 mg/dL — ABNORMAL HIGH (ref 65–99)
Glucose-Capillary: 219 mg/dL — ABNORMAL HIGH (ref 65–99)
Glucose-Capillary: 234 mg/dL — ABNORMAL HIGH (ref 65–99)
Glucose-Capillary: 239 mg/dL — ABNORMAL HIGH (ref 65–99)
Glucose-Capillary: 264 mg/dL — ABNORMAL HIGH (ref 65–99)

## 2015-04-22 LAB — BASIC METABOLIC PANEL
Anion gap: 6 (ref 5–15)
BUN: 16 mg/dL (ref 6–20)
CHLORIDE: 107 mmol/L (ref 101–111)
CO2: 25 mmol/L (ref 22–32)
Calcium: 8.1 mg/dL — ABNORMAL LOW (ref 8.9–10.3)
Creatinine, Ser: 1.35 mg/dL — ABNORMAL HIGH (ref 0.61–1.24)
GFR calc non Af Amer: 52 mL/min — ABNORMAL LOW (ref >60)
Glucose, Bld: 121 mg/dL — ABNORMAL HIGH (ref 65–99)
POTASSIUM: 4.4 mmol/L (ref 3.5–5.1)
SODIUM: 138 mmol/L (ref 135–145)

## 2015-04-22 LAB — POCT I-STAT, CHEM 8
BUN: 17 mg/dL (ref 6–20)
Calcium, Ion: 1.14 mmol/L (ref 1.13–1.30)
Chloride: 96 mmol/L — ABNORMAL LOW (ref 101–111)
Creatinine, Ser: 1.4 mg/dL — ABNORMAL HIGH (ref 0.61–1.24)
Glucose, Bld: 234 mg/dL — ABNORMAL HIGH (ref 65–99)
HCT: 32 % — ABNORMAL LOW (ref 39.0–52.0)
Hemoglobin: 10.9 g/dL — ABNORMAL LOW (ref 13.0–17.0)
Potassium: 4.3 mmol/L (ref 3.5–5.1)
Sodium: 131 mmol/L — ABNORMAL LOW (ref 135–145)
TCO2: 23 mmol/L (ref 0–100)

## 2015-04-22 LAB — HEMOGLOBIN A1C
Hgb A1c MFr Bld: 7.5 % — ABNORMAL HIGH (ref 4.8–5.6)
Mean Plasma Glucose: 169 mg/dL

## 2015-04-22 LAB — CREATININE, SERUM
CREATININE: 1.54 mg/dL — AB (ref 0.61–1.24)
GFR calc Af Amer: 51 mL/min — ABNORMAL LOW (ref >60)
GFR calc non Af Amer: 44 mL/min — ABNORMAL LOW (ref >60)

## 2015-04-22 LAB — MAGNESIUM
MAGNESIUM: 2.4 mg/dL (ref 1.7–2.4)
MAGNESIUM: 2.3 mg/dL (ref 1.7–2.4)

## 2015-04-22 MED ORDER — INSULIN DETEMIR 100 UNIT/ML ~~LOC~~ SOLN: 12.0000 [IU] | Freq: Two times a day (BID) | SUBCUTANEOUS | Status: DC

## 2015-04-22 MED ORDER — CETYLPYRIDINIUM CHLORIDE 0.05 % MT LIQD
7.0000 mL | Freq: Two times a day (BID) | OROMUCOSAL | Status: DC
  Administered 2015-04-22 – 2015-04-26 (×7): 7 mL via OROMUCOSAL

## 2015-04-22 MED ORDER — INSULIN DETEMIR 100 UNIT/ML ~~LOC~~ SOLN
18.0000 [IU] | Freq: Two times a day (BID) | SUBCUTANEOUS | Status: DC
  Administered 2015-04-22 – 2015-04-23 (×2): 18 [IU] via SUBCUTANEOUS
  Filled 2015-04-22 (×3): qty 0.18

## 2015-04-22 MED ORDER — FUROSEMIDE 10 MG/ML IJ SOLN
20.0000 mg | Freq: Two times a day (BID) | INTRAMUSCULAR | Status: DC
  Administered 2015-04-22 – 2015-04-23 (×2): 20 mg via INTRAVENOUS
  Filled 2015-04-22 (×2): qty 2

## 2015-04-22 MED ORDER — INSULIN DETEMIR 100 UNIT/ML ~~LOC~~ SOLN: 20.0000 [IU] | Freq: Two times a day (BID) | SUBCUTANEOUS | Status: DC

## 2015-04-22 MED ORDER — INSULIN ASPART 100 UNIT/ML ~~LOC~~ SOLN
0.0000 [IU] | SUBCUTANEOUS | Status: DC
  Administered 2015-04-22 (×2): 8 [IU] via SUBCUTANEOUS
  Administered 2015-04-22: 2 [IU] via SUBCUTANEOUS
  Administered 2015-04-23: 4 [IU] via SUBCUTANEOUS
  Administered 2015-04-23: 2 [IU] via SUBCUTANEOUS
  Administered 2015-04-23: 4 [IU] via SUBCUTANEOUS

## 2015-04-22 MED ORDER — INSULIN DETEMIR 100 UNIT/ML ~~LOC~~ SOLN
12.0000 [IU] | Freq: Two times a day (BID) | SUBCUTANEOUS | Status: DC
  Administered 2015-04-22: 12 [IU] via SUBCUTANEOUS
  Filled 2015-04-22 (×2): qty 0.12

## 2015-04-22 MED ORDER — INSULIN ASPART 100 UNIT/ML ~~LOC~~ SOLN
12.0000 [IU] | Freq: Once | SUBCUTANEOUS | Status: AC
  Administered 2015-04-22: 12 [IU] via SUBCUTANEOUS

## 2015-04-22 MED ORDER — CETYLPYRIDINIUM CHLORIDE 0.05 % MT LIQD: 7.0000 mL | Freq: Two times a day (BID) | OROMUCOSAL | Status: DC

## 2015-04-22 NOTE — Progress Notes (Signed)
   04/22/15 0700  Clinical Encounter Type  Visited With Patient  Visit Type Initial   Chaplain made the error in communication with family. After realizing the error  Chaplain called family and informed them of the correct information (which is that the Pt. is doing fine and has NOT coded)   Chaplain called and apologized to the family and informed the nurse of her inexcusable mistake.   Pt. Is asleep and Is not aware of the miscommunication at this time.

## 2015-04-22 NOTE — Progress Notes (Signed)
1 Day Post-Op Procedure(s) (LRB): CORONARY ARTERY BYPASS GRAFTING (CABG) X 4 UTILIZING THE LEFT INTERNAL MAMMARY ARTERY TO LAD, ENDOSCOPICALLY HARVESTED RIGHT SAPHENEOUS VEINS TO PD,DIAGONAL AND CIRCUMFLEX. (N/A) TRANSESOPHAGEAL ECHOCARDIOGRAM (TEE) (N/A) Subjective: Mild surgical pain nsr CXR clear  Objective: Vital signs in last 24 hours: Temp:  [97.2 F (36.2 C)-100.4 F (38 C)] 99.1 F (37.3 C) (01/14 0900) Pulse Rate:  [64-98] 74 (01/14 0900) Cardiac Rhythm:  [-] Normal sinus rhythm (01/14 0800) Resp:  [12-29] 15 (01/14 0900) BP: (85-171)/(53-97) 120/53 mmHg (01/14 0900) SpO2:  [93 %-100 %] 97 % (01/14 0900) Arterial Line BP: (82-200)/(27-97) 127/59 mmHg (01/14 0900) FiO2 (%):  [40 %-50 %] 40 % (01/13 1736) Weight:  [219 lb 9.3 oz (99.6 kg)] 219 lb 9.3 oz (99.6 kg) (01/14 0600)  Hemodynamic parameters for last 24 hours: PAP: (15-36)/(7-19) 35/13 mmHg CO:  [3.8 L/min-7.7 L/min] 5.2 L/min CI:  [1.8 L/min/m2-3.7 L/min/m2] 2.5 L/min/m2  Intake/Output from previous day: 01/13 0701 - 01/14 0700 In: [id number] [P.O.:1320; I.V.:2772.2; Blood:682; NG/GT:30; IV [id number] Out: [id number] [Urine:4200; Blood:1200; Chest Tube:420] Intake/Output this shift: Total I/O In: 229.9 [P.O.:120; I.V.:109.9] Out: 240 [Urine:200; Chest Tube:40]  Lungs cleAR NO AIRLEAK  Lab Results:  Recent Labs  04/21/15 2010 04/22/15 0400  WBC 5.7 7.5  HGB 9.3* 9.2*  HCT 27.9* 28.2*  PLT 63* 87*   BMET:  Recent Labs  04/21/15 0240  04/21/15 2008 04/21/15 2010 04/22/15 0400  NA 137  < > 138  --  138  K 4.7  < > 4.9  --  4.4  CL 101  < > 105  --  107  CO2 26  --   --   --  25  GLUCOSE 206*  < > 185*  --  121*  BUN 25*  < > 18  --  16  CREATININE 1.66*  < > 1.20 1.43* 1.35*  CALCIUM 9.5  --   --   --  8.1*  < > = values in this interval not displayed.  PT/INR:  Recent Labs  04/21/15 1420  LABPROT 18.3*  INR 1.51*   ABG    Component Value Date/Time   PHART 7.286* 04/21/2015 1928   HCO3 22.1 04/21/2015 1928   TCO2 22 04/21/2015 2008   ACIDBASEDEF 5.0* 04/21/2015 1928   O2SAT 98.0 04/21/2015 1928   CBG (last 3)   Recent Labs  04/21/15 2301 04/22/15 0002 04/22/15 0109  GLUCAP 163* 163* 150*    Assessment/Plan: S/P Procedure(s) (LRB): CORONARY ARTERY BYPASS GRAFTING (CABG) X 4 UTILIZING THE LEFT INTERNAL MAMMARY ARTERY TO LAD, ENDOSCOPICALLY HARVESTED RIGHT SAPHENEOUS VEINS TO PD,DIAGONAL AND CIRCUMFLEX. (N/A) TRANSESOPHAGEAL ECHOCARDIOGRAM (TEE) (N/A) Mobilize Diuresis Diabetes control d/c tubes/lines See progression orders   LOS: 3 days    Tharon Aquas Trigt III 04/22/2015

## 2015-04-22 NOTE — Progress Notes (Signed)
CT surgery p.m. Rounds  Patient had stable day and was up in chair and walking room. Maintaining sinus rhythm  Blood sugars now 235 so we'll increase twice a day dosing of Lantus insulin. We'll probably resume Glucotrol tomorrow.

## 2015-04-23 ENCOUNTER — Inpatient Hospital Stay (HOSPITAL_COMMUNITY): Payer: Medicare Other

## 2015-04-23 DIAGNOSIS — I4891 Unspecified atrial fibrillation: Secondary | ICD-10-CM

## 2015-04-23 HISTORY — DX: Unspecified atrial fibrillation: I48.91

## 2015-04-23 LAB — GLUCOSE, CAPILLARY
Glucose-Capillary: 200 mg/dL — ABNORMAL HIGH (ref 65–99)
Glucose-Capillary: 148 mg/dL — ABNORMAL HIGH (ref 65–99)
Glucose-Capillary: 176 mg/dL — ABNORMAL HIGH (ref 65–99)
Glucose-Capillary: 292 mg/dL — ABNORMAL HIGH (ref 65–99)
Glucose-Capillary: 299 mg/dL — ABNORMAL HIGH (ref 65–99)
Glucose-Capillary: 190 mg/dL — ABNORMAL HIGH (ref 65–99)
Glucose-Capillary: 170 mg/dL — ABNORMAL HIGH (ref 65–99)

## 2015-04-23 LAB — BASIC METABOLIC PANEL WITH GFR
Anion gap: 5 (ref 5–15)
BUN: 16 mg/dL (ref 6–20)
CO2: 27 mmol/L (ref 22–32)
Calcium: 8.4 mg/dL — ABNORMAL LOW (ref 8.9–10.3)
Chloride: 103 mmol/L (ref 101–111)
Creatinine, Ser: 1.43 mg/dL — ABNORMAL HIGH (ref 0.61–1.24)
GFR calc Af Amer: 56 mL/min — ABNORMAL LOW
GFR calc non Af Amer: 48 mL/min — ABNORMAL LOW
Glucose, Bld: 161 mg/dL — ABNORMAL HIGH (ref 65–99)
Potassium: 4.3 mmol/L (ref 3.5–5.1)
Sodium: 135 mmol/L (ref 135–145)

## 2015-04-23 LAB — CBC
HCT: 26.9 % — ABNORMAL LOW (ref 39.0–52.0)
Hemoglobin: 8.9 g/dL — ABNORMAL LOW (ref 13.0–17.0)
MCH: 19.6 pg — ABNORMAL LOW (ref 26.0–34.0)
MCHC: 33.1 g/dL (ref 30.0–36.0)
MCV: 59.1 fL — ABNORMAL LOW (ref 78.0–100.0)
Platelets: 65 K/uL — ABNORMAL LOW (ref 150–400)
RBC: 4.55 MIL/uL (ref 4.22–5.81)
RDW: 16.3 % — ABNORMAL HIGH (ref 11.5–15.5)
WBC: 7.7 K/uL (ref 4.0–10.5)

## 2015-04-23 MED ORDER — AMIODARONE HCL IN DEXTROSE 360-4.14 MG/200ML-% IV SOLN
30.0000 mg/h | INTRAVENOUS | Status: DC
  Administered 2015-04-23 (×2): 30 mg/h via INTRAVENOUS
  Filled 2015-04-23: qty 200

## 2015-04-23 MED ORDER — INSULIN ASPART 100 UNIT/ML ~~LOC~~ SOLN
0.0000 [IU] | Freq: Three times a day (TID) | SUBCUTANEOUS | Status: DC
  Administered 2015-04-23: 12 [IU] via SUBCUTANEOUS
  Administered 2015-04-23 – 2015-04-24 (×2): 4 [IU] via SUBCUTANEOUS

## 2015-04-23 MED ORDER — GLIPIZIDE 5 MG PO TABS
2.5000 mg | ORAL_TABLET | Freq: Two times a day (BID) | ORAL | Status: DC
  Administered 2015-04-23 – 2015-04-24 (×3): 2.5 mg via ORAL
  Filled 2015-04-23 (×6): qty 1

## 2015-04-23 MED ORDER — SODIUM CHLORIDE 0.9 % IV SOLN: 250.0000 mL | INTRAVENOUS | Status: DC | PRN

## 2015-04-23 MED ORDER — INSULIN DETEMIR 100 UNIT/ML ~~LOC~~ SOLN
24.0000 [IU] | Freq: Two times a day (BID) | SUBCUTANEOUS | Status: DC
  Administered 2015-04-23 – 2015-04-24 (×3): 24 [IU] via SUBCUTANEOUS
  Filled 2015-04-23 (×5): qty 0.24

## 2015-04-23 MED ORDER — METOPROLOL TARTRATE 25 MG PO TABS
25.0000 mg | ORAL_TABLET | Freq: Two times a day (BID) | ORAL | Status: DC
  Administered 2015-04-23: 25 mg via ORAL
  Filled 2015-04-23: qty 1

## 2015-04-23 MED ORDER — MAGNESIUM HYDROXIDE 400 MG/5ML PO SUSP
30.0000 mL | Freq: Every day | ORAL | Status: DC | PRN
Start: 2015-04-23 — End: 2015-04-26

## 2015-04-23 MED ORDER — MOVING RIGHT ALONG BOOK
Freq: Once | Status: AC
  Administered 2015-04-23: 1
  Filled 2015-04-23: qty 1

## 2015-04-23 MED ORDER — SODIUM CHLORIDE 0.9 % IJ SOLN: 3.0000 mL | INTRAMUSCULAR | Status: DC | PRN

## 2015-04-23 MED ORDER — AMIODARONE LOAD VIA INFUSION
150.0000 mg | Freq: Once | INTRAVENOUS | Status: AC
  Administered 2015-04-23: 150 mg via INTRAVENOUS
  Filled 2015-04-23: qty 83.34

## 2015-04-23 MED ORDER — INSULIN ASPART 100 UNIT/ML ~~LOC~~ SOLN
12.0000 [IU] | Freq: Once | SUBCUTANEOUS | Status: AC
  Administered 2015-04-23: 12 [IU] via SUBCUTANEOUS

## 2015-04-23 MED ORDER — POTASSIUM CHLORIDE CRYS ER 20 MEQ PO TBCR
20.0000 meq | EXTENDED_RELEASE_TABLET | Freq: Every day | ORAL | Status: DC
  Administered 2015-04-23 – 2015-04-26 (×4): 20 meq via ORAL
  Filled 2015-04-23 (×4): qty 1

## 2015-04-23 MED ORDER — SODIUM CHLORIDE 0.9 % IJ SOLN
3.0000 mL | Freq: Two times a day (BID) | INTRAMUSCULAR | Status: DC
Start: 2015-04-23 — End: 2015-04-26
  Administered 2015-04-23: 3 mL via INTRAVENOUS
  Administered 2015-04-23: 10 mL via INTRAVENOUS
  Administered 2015-04-24 – 2015-04-25 (×2): 3 mL via INTRAVENOUS

## 2015-04-23 MED ORDER — AMIODARONE HCL IN DEXTROSE 360-4.14 MG/200ML-% IV SOLN
60.0000 mg/h | INTRAVENOUS | Status: DC
  Administered 2015-04-23 (×2): 60 mg/h via INTRAVENOUS
  Filled 2015-04-23: qty 200

## 2015-04-23 MED ORDER — FUROSEMIDE 40 MG PO TABS
40.0000 mg | ORAL_TABLET | Freq: Every day | ORAL | Status: DC
  Administered 2015-04-24 – 2015-04-26 (×3): 40 mg via ORAL
  Filled 2015-04-23 (×3): qty 1

## 2015-04-23 MED ORDER — ALUM & MAG HYDROXIDE-SIMETH 200-200-20 MG/5ML PO SUSP: 15.0000 mL | ORAL | Status: DC | PRN

## 2015-04-23 NOTE — Plan of Care (Signed)
Problem: Phase III - Recovery through Discharge Goal: Maintain Hemodynamic Stability Outcome: Progressing Pt had afib with RVR today, is on Amio gtt, converted back to and has remained in NSR

## 2015-04-23 NOTE — Progress Notes (Addendum)
2 Days Post-Op Procedure(s) (LRB): CORONARY ARTERY BYPASS GRAFTING (CABG) X 4 UTILIZING THE LEFT INTERNAL MAMMARY ARTERY TO LAD, ENDOSCOPICALLY HARVESTED RIGHT SAPHENEOUS VEINS TO PD,DIAGONAL AND CIRCUMFLEX. (N/A) TRANSESOPHAGEAL ECHOCARDIOGRAM (TEE) (N/A) Subjective: Feels much better today Blood sugars under better control today on split doses of Lantus with sliding scale We'll resume preop oral Glucotrol today Went into rapid atrial fibrillation getting out of bed to go to the bathroom IV amiodarone protocol started Platelet count continues to decrease now 65k - Will hold  aspirin and wait on prophylactic Lovenox dosing Chest x-ray clear with good oxygenation saturation  When Patient converts to sinus rhythm will plan transfer to step down unit Continue gentle Lasix diuresis  Objective: Vital signs in last 24 hours: Temp:  [97.7 F (36.5 C)-99.1 F (37.3 C)] 98.7 F (37.1 C) (01/15 1145) Pulse Rate:  [68-91] 78 (01/15 1027) Cardiac Rhythm:  [-] Normal sinus rhythm (01/15 0800) Resp:  [18-29] 24 (01/15 1027) BP: (111-145)/(63-81) 113/63 mmHg (01/15 1000) SpO2:  [90 %-98 %] 92 % (01/15 1027) Weight:  [216 lb 14.9 oz (98.4 kg)] 216 lb 14.9 oz (98.4 kg) (01/15 0500)  Hemodynamic parameters for last 24 hours:   afebrile  Intake/Output from previous day: 01/14 0701 - 01/15 0700 In: 1844.3 [P.O.:1400; I.V.:344.3; IV Piggyback:100] Out: 4105 [Urine:3975; Chest Tube:130] Intake/Output this shift: Total I/O In: 164.5 [I.V.:164.5] Out: 475 [Urine:475]  Alert and comfortable Lungs clear Abdomen soft nontender Extremities warm  Lab Results:  Recent Labs  04/22/15 1651 04/23/15 0400  WBC 8.5 7.7  HGB 9.4* 8.9*  HCT 28.4* 26.9*  PLT 75* 65*   BMET:  Recent Labs  04/22/15 0400 04/22/15 1637 04/22/15 1651 04/23/15 0400  NA 138 131*  --  135  K 4.4 4.3  --  4.3  CL 107 96*  --  103  CO2 25  --   --  27  GLUCOSE 121* 234*  --  161*  BUN 16 17  --  16  CREATININE  1.35* 1.40* 1.54* 1.43*  CALCIUM 8.1*  --   --  8.4*    PT/INR:  Recent Labs  04/21/15 1420  LABPROT 18.3*  INR 1.51*   ABG    Component Value Date/Time   PHART 7.286* 04/21/2015 1928   HCO3 22.1 04/21/2015 1928   TCO2 23 04/22/2015 1637   ACIDBASEDEF 5.0* 04/21/2015 1928   O2SAT 98.0 04/21/2015 1928   CBG (last 3)   Recent Labs  04/23/15 0005 04/23/15 0422 04/23/15 0800  GLUCAP 200* 148* 176*    Assessment/Plan: S/P Procedure(s) (LRB): CORONARY ARTERY BYPASS GRAFTING (CABG) X 4 UTILIZING THE LEFT INTERNAL MAMMARY ARTERY TO LAD, ENDOSCOPICALLY HARVESTED RIGHT SAPHENEOUS VEINS TO PD,DIAGONAL AND CIRCUMFLEX. (N/A) TRANSESOPHAGEAL ECHOCARDIOGRAM (TEE) (N/A) Mobilize Diuresis Diabetes control Plan for transfer to step-down: see transfer orders Follow platelet count-continue to hold aspirin and Lovenox  LOS: 4 days    [name] 04/23/2015

## 2015-04-23 NOTE — Progress Notes (Signed)
CT surgery p.m. Rounds Patient had several walks in the hallway today On IV amiodarone his atrial fibrillation has resolved and he has maintained sinus rhythm for several hours Blood sugars are rising again despite adding Glucotrol. Split dose of Lantus insulin has been increased. P.m. labs are satisfactory except for blood sugar 195

## 2015-04-23 NOTE — Progress Notes (Signed)
Report given to Audrea Muscat RN on [facility]. Sherrie Mustache 6:23 PM

## 2015-04-23 NOTE — Progress Notes (Signed)
Dr. Prescott Gum notified of saturated distal part of silver hydrofiber dressing, incision intact upon assessing, ordered to paint with betadine and redress incision with gauze, pt educated on importance of protecting incision site, will continue to monitor. Kathleen Argue S 1700

## 2015-04-24 ENCOUNTER — Inpatient Hospital Stay (HOSPITAL_COMMUNITY): Payer: Medicare Other

## 2015-04-24 ENCOUNTER — Encounter (HOSPITAL_COMMUNITY): Payer: Self-pay | Admitting: Cardiothoracic Surgery

## 2015-04-24 LAB — URINALYSIS, ROUTINE W REFLEX MICROSCOPIC
Bilirubin Urine: NEGATIVE
Glucose, UA: 250 mg/dL — AB
Ketones, ur: NEGATIVE mg/dL
Leukocytes, UA: NEGATIVE
Nitrite: NEGATIVE
Protein, ur: NEGATIVE mg/dL
Specific Gravity, Urine: 1.013 (ref 1.005–1.030)
pH: 6 (ref 5.0–8.0)

## 2015-04-24 LAB — BASIC METABOLIC PANEL
Anion gap: 5 (ref 5–15)
BUN: 17 mg/dL (ref 6–20)
CO2: 27 mmol/L (ref 22–32)
Calcium: 8.6 mg/dL — ABNORMAL LOW (ref 8.9–10.3)
Chloride: 106 mmol/L (ref 101–111)
Creatinine, Ser: 1.34 mg/dL — ABNORMAL HIGH (ref 0.61–1.24)
GFR calc Af Amer: >60 mL/min (ref >60)
GFR calc non Af Amer: 52 mL/min — ABNORMAL LOW (ref >60)
Glucose, Bld: 165 mg/dL — ABNORMAL HIGH (ref 65–99)
Potassium: 4.4 mmol/L (ref 3.5–5.1)
Sodium: 138 mmol/L (ref 135–145)

## 2015-04-24 LAB — URINE MICROSCOPIC-ADD ON

## 2015-04-24 LAB — GLUCOSE, CAPILLARY
Glucose-Capillary: 166 mg/dL — ABNORMAL HIGH (ref 65–99)
Glucose-Capillary: 195 mg/dL — ABNORMAL HIGH (ref 65–99)
Glucose-Capillary: 156 mg/dL — ABNORMAL HIGH (ref 65–99)
Glucose-Capillary: 154 mg/dL — ABNORMAL HIGH (ref 65–99)

## 2015-04-24 LAB — CBC
HCT: 26.5 % — ABNORMAL LOW (ref 39.0–52.0)
Hemoglobin: 8.7 g/dL — ABNORMAL LOW (ref 13.0–17.0)
MCH: 19.6 pg — ABNORMAL LOW (ref 26.0–34.0)
MCHC: 32.8 g/dL (ref 30.0–36.0)
MCV: 59.7 fL — ABNORMAL LOW (ref 78.0–100.0)
Platelets: 83 10*3/uL — ABNORMAL LOW (ref 150–400)
RBC: 4.44 MIL/uL (ref 4.22–5.81)
RDW: 16.8 % — ABNORMAL HIGH (ref 11.5–15.5)
WBC: 6.3 10*3/uL (ref 4.0–10.5)

## 2015-04-24 MED ORDER — DOCUSATE SODIUM 100 MG PO CAPS
200.0000 mg | ORAL_CAPSULE | Freq: Every day | ORAL | Status: DC
  Administered 2015-04-24 – 2015-04-26 (×3): 200 mg via ORAL
  Filled 2015-04-24 (×3): qty 2

## 2015-04-24 MED ORDER — SODIUM CHLORIDE 0.9 % IJ SOLN
3.0000 mL | Freq: Two times a day (BID) | INTRAMUSCULAR | Status: DC
  Administered 2015-04-24: 22:00:00 via INTRAVENOUS
  Administered 2015-04-25: 3 mL via INTRAVENOUS

## 2015-04-24 MED ORDER — SODIUM CHLORIDE 0.9 % IJ SOLN: 3.0000 mL | INTRAMUSCULAR | Status: DC | PRN

## 2015-04-24 MED ORDER — BISACODYL 5 MG PO TBEC: 10.0000 mg | DELAYED_RELEASE_TABLET | Freq: Every day | ORAL | Status: DC | PRN

## 2015-04-24 MED ORDER — ONDANSETRON HCL 4 MG PO TABS: 4.0000 mg | ORAL_TABLET | Freq: Four times a day (QID) | ORAL | Status: DC | PRN

## 2015-04-24 MED ORDER — OXYCODONE HCL 5 MG PO TABS: 5.0000 mg | ORAL_TABLET | ORAL | Status: DC | PRN

## 2015-04-24 MED ORDER — FOLIC ACID 1 MG PO TABS
1.0000 mg | ORAL_TABLET | Freq: Every day | ORAL | Status: DC
  Administered 2015-04-24 – 2015-04-26 (×3): 1 mg via ORAL
  Filled 2015-04-24 (×3): qty 1

## 2015-04-24 MED ORDER — PANTOPRAZOLE SODIUM 40 MG PO TBEC
40.0000 mg | DELAYED_RELEASE_TABLET | Freq: Every day | ORAL | Status: DC
  Administered 2015-04-24 – 2015-04-26 (×3): 40 mg via ORAL
  Filled 2015-04-24 (×3): qty 1

## 2015-04-24 MED ORDER — SODIUM CHLORIDE 0.9 % IV SOLN: 250.0000 mL | INTRAVENOUS | Status: DC | PRN

## 2015-04-24 MED ORDER — TRAMADOL HCL 50 MG PO TABS
50.0000 mg | ORAL_TABLET | ORAL | Status: DC | PRN
  Administered 2015-04-24: 100 mg via ORAL
  Filled 2015-04-24: qty 2

## 2015-04-24 MED ORDER — METOPROLOL TARTRATE 12.5 MG HALF TABLET: 12.5000 mg | ORAL_TABLET | Freq: Two times a day (BID) | ORAL | Status: DC

## 2015-04-24 MED ORDER — ONDANSETRON HCL 4 MG/2ML IJ SOLN: 4.0000 mg | Freq: Four times a day (QID) | INTRAMUSCULAR | Status: DC | PRN

## 2015-04-24 MED ORDER — BISACODYL 10 MG RE SUPP: 10.0000 mg | Freq: Every day | RECTAL | Status: DC | PRN

## 2015-04-24 MED ORDER — MOVING RIGHT ALONG BOOK
Freq: Once | Status: DC
  Filled 2015-04-24: qty 1

## 2015-04-24 MED ORDER — ASPIRIN EC 81 MG PO TBEC
81.0000 mg | DELAYED_RELEASE_TABLET | Freq: Every day | ORAL | Status: DC
  Administered 2015-04-24 – 2015-04-26 (×3): 81 mg via ORAL
  Filled 2015-04-24 (×3): qty 1

## 2015-04-24 MED ORDER — INSULIN ASPART 100 UNIT/ML ~~LOC~~ SOLN
0.0000 [IU] | Freq: Three times a day (TID) | SUBCUTANEOUS | Status: DC
  Administered 2015-04-24 (×2): 2 [IU] via SUBCUTANEOUS
  Administered 2015-04-24 – 2015-04-25 (×2): 4 [IU] via SUBCUTANEOUS
  Administered 2015-04-25: 2 [IU] via SUBCUTANEOUS
  Administered 2015-04-25: 4 [IU] via SUBCUTANEOUS
  Administered 2015-04-26: 2 [IU] via SUBCUTANEOUS

## 2015-04-24 MED ORDER — AMIODARONE HCL 200 MG PO TABS: 400.0000 mg | ORAL_TABLET | Freq: Every day | ORAL | Status: DC

## 2015-04-24 MED ORDER — AMIODARONE HCL 200 MG PO TABS
400.0000 mg | ORAL_TABLET | Freq: Two times a day (BID) | ORAL | Status: DC
  Administered 2015-04-24 – 2015-04-26 (×5): 400 mg via ORAL
  Filled 2015-04-24 (×6): qty 2

## 2015-04-24 MED FILL — Potassium Chloride Inj 2 mEq/ML: INTRAVENOUS | Qty: 40 | Status: AC

## 2015-04-24 MED FILL — Magnesium Sulfate Inj 50%: INTRAMUSCULAR | Qty: 10 | Status: AC

## 2015-04-24 MED FILL — Heparin Sodium (Porcine) Inj 1000 Unit/ML: INTRAMUSCULAR | Qty: 30 | Status: AC

## 2015-04-24 NOTE — Progress Notes (Signed)
CARDIAC REHAB PHASE I   PRE:  Rate/Rhythm: 72 SR    BP: sitting 116/74    SaO2: 92 RA  MODE:  Ambulation: 1040 ft   POST:  Rate/Rhythm: 98 SR    BP: sitting 140/80     SaO2: 97 RA  Pt able to stand and walk independently. No c/o except mild congestion. Fairly quick pace, long distance. Return to recliner but then to BR. Can walk independently. Will f/u tomorrow. [id number]   [name] [place] CES, ACSM 04/24/2015 2:59 PM

## 2015-04-24 NOTE — Progress Notes (Addendum)
Patient ID: [name], male   DOB: 1945-07-10, 70 y.o.   MRN: [id number] [facility] DAILY ICU PROGRESS NOTE                   [address]            [place] [address]          [phone]   3 Days Post-Op Procedure(s) (LRB): CORONARY ARTERY BYPASS GRAFTING (CABG) X 4 UTILIZING THE LEFT INTERNAL MAMMARY ARTERY TO LAD, ENDOSCOPICALLY HARVESTED RIGHT SAPHENEOUS VEINS TO PD,DIAGONAL AND CIRCUMFLEX. (N/A) TRANSESOPHAGEAL ECHOCARDIOGRAM (TEE) (N/A)  Total Length of Stay:  LOS: 5 days   Subjective: Up in chair, alert and neuro intact, good pain control Some a fib on iv Cordarone drip, currently in sinus  Objective: Vital signs in last 24 hours: Temp:  [98.1 F (36.7 C)-98.8 F (37.1 C)] 98.4 F (36.9 C) (01/16 0752) Pulse Rate:  [58-79] 64 (01/16 0700) Cardiac Rhythm:  [-] Normal sinus rhythm (01/16 0745) Resp:  [20-27] 24 (01/16 0700) BP: (106-130)/(63-77) 107/69 mmHg (01/16 0400) SpO2:  [90 %-97 %] 97 % (01/16 0700) Weight:  [212 lb 11.9 oz (96.5 kg)] 212 lb 11.9 oz (96.5 kg) (01/16 0500)  Filed Weights   04/22/15 0600 04/23/15 0500 04/24/15 0500  Weight: 219 lb 9.3 oz (99.6 kg) 216 lb 14.9 oz (98.4 kg) 212 lb 11.9 oz (96.5 kg)    Weight change: -4 lb 3 oz (-1.9 kg)   Hemodynamic parameters for last 24 hours:    Intake/Output from previous day: 01/15 0701 - 01/16 0700 In: 1913.9 [P.O.:1110; I.V.:803.9] Out: 3150 [Urine:3150]  Intake/Output this shift:    Current Meds: Scheduled Meds: . acetaminophen  1,000 mg Oral 4 times per day   Or  . acetaminophen (TYLENOL) oral liquid 160 mg/5 mL  1,000 mg Per Tube 4 times per day  . antiseptic oral rinse  7 mL Mouth Rinse BID  . atorvastatin  40 mg Oral q1800  . bisacodyl  10 mg Oral Daily   Or  . bisacodyl  10 mg Rectal Daily  . ciprofloxacin  250 mg Oral BID  . docusate sodium  200 mg Oral Daily  . furosemide  40 mg Oral Daily  . glipiZIDE  2.5 mg Oral BID AC  . insulin aspart  0-24 Units  Subcutaneous TID AC & HS  . insulin detemir  24 Units Subcutaneous BID  . metoprolol tartrate  25 mg Oral BID  . pantoprazole  40 mg Oral Daily  . potassium chloride  20 mEq Oral Daily  . sodium chloride  3 mL Intravenous Q12H  . sodium chloride  3 mL Intravenous Q12H   Continuous Infusions: . sodium chloride Stopped (04/22/15 1300)  . sodium chloride    . sodium chloride Stopped (04/22/15 0800)  . amiodarone 30 mg/hr (04/23/15 2042)  . lactated ringers 10 mL/hr at 04/23/15 1900   PRN Meds:.sodium chloride, sodium chloride, alum & mag hydroxide-simeth, magnesium hydroxide, metoprolol, ondansetron (ZOFRAN) IV, oxyCODONE, sodium chloride, sodium chloride, traMADol  General appearance: alert, cooperative and no distress Neurologic: intact Heart: regular rate and rhythm, S1, S2 normal, no murmur, click, rub or gallop Lungs: diminished breath sounds bibasilar Abdomen: soft, non-tender; bowel sounds normal; no masses,  no organomegaly Extremities: extremities normal, atraumatic, no cyanosis or edema and Homans sign is negative, no sign of DVT Wound: dressing intact, small amt of drainage on dressing, will change when back in bed  Lab Results: CBC: Recent Labs  04/23/15 0400 04/24/15 0436  WBC 7.7 6.3  HGB 8.9* 8.7*  HCT 26.9* 26.5*  PLT 65* 83*   BMET:  Recent Labs  04/23/15 0400 04/24/15 0436  NA 135 138  K 4.3 4.4  CL 103 106  CO2 27 27  GLUCOSE 161* 165*  BUN 16 17  CREATININE 1.43* 1.34*  CALCIUM 8.4* 8.6*    PT/INR:  Recent Labs  04/21/15 1420  LABPROT 18.3*  INR 1.51*   Radiology: No results found. Chronic Kidney Disease   Stage I     GFR >90  Stage II    GFR 60-89  Stage IIIA GFR 45-59  Stage IIIB GFR 30-44  Stage IV   GFR 15-29  Stage V    GFR  <15  Lab Results  Component Value Date   CREATININE 1.34* 04/24/2015   Estimated Creatinine Clearance: 60.6 mL/min (by C-G formula based on Cr of 1.34).  Assessment/Plan: S/P Procedure(s)  (LRB): CORONARY ARTERY BYPASS GRAFTING (CABG) X 4 UTILIZING THE LEFT INTERNAL MAMMARY ARTERY TO LAD, ENDOSCOPICALLY HARVESTED RIGHT SAPHENEOUS VEINS TO PD,DIAGONAL AND CIRCUMFLEX. (N/A) TRANSESOPHAGEAL ECHOCARDIOGRAM (TEE) (N/A) Mobilize Diuresis Diabetes control Plan for transfer to step-down: see transfer orders Preop thrombocytopenia, chronic unknown cause, have discussed with hematology and will have work up done after recovers from CABG Avoid heparin with low platelets, stable currently. Convert to po Cordarone Renal function at preop baseline Prop urine culture never had sample sent Will repeat ua, treated empirically with cipro  Grace Isaac 04/24/2015 7:57 AM

## 2015-04-24 NOTE — Op Note (Signed)
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  SURGICAL PROCEDURE:  Coronary artery bypass grafting x4 with left internal mammary to the left anterior descending coronary artery, reverse saphenous vein graft to the diagonal coronary artery, reverse saphenous vein graft to the circumflex coronary artery and reverse saphenous vein graft to the posterior descending coronary artery with right thigh and upper calf endovein harvesting of the greater saphenous vein.  SURGEON:  Lanelle Bal, MD  FIRST ASSISTANT:  John Giovanni, PA-C  BRIEF HISTORY:  The patient is a 70 year old male with no previous history of coronary occlusive disease who presents to Dr. Clayborn Bigness, in [place] with new onset of prolonged chest pain.  Cardiac enzymes were positive.  Patient was admitted for non-STEMI myocardial infarction, stabilized medically and underwent cardiac catheterization.  At the time of catheterization, he was noted to have severe three-vessel coronary artery disease with multiple areas of disease in the LAD, but with both proximal and very distal disease.  The circumflex was a large vessel with a mid 80% lesion.  The right coronary artery was totally occluded. Overall, ventricular function was preserved.  The patient had baseline elevation of his creatinine to 1.4 and diabetes.  Risks and options  were discussed with the patient in detail and he agreed and signed informed consent.  DESCRIPTION OF PROCEDURE:  With Swan-Ganz and arterial line monitors in place, the patient underwent general endotracheal anesthesia without incident.  Skin of the chest and legs was prepped with Betadine and draped in usual sterile manner.  Appropriate time-out was performed.  We then proceeded with right greater saphenous vein harvesting endoscopically from the right thigh and upper calf.  The vein was of good quality and size.  Median sternotomy was performed.  Left internal mammary artery was dissected down as a pedicle graft.  Distal artery was divided, had good free flow.  Pericardium was opened.  Overall, ventricular function appeared preserved.  The patient was systemically heparinized.  The ascending aorta was cannulated and the right atrium was cannulated.  It should be noted that the patient's aorta was mildly dilated, measured approximately 4 cm.  In the midportion of the ascending aorta, aortic root was not dilated.  The patient was placed on cardiopulmonary bypass 2.4 L/min/m2.  Sites of anastomosis were selected and dissected out of the epicardium.  Patient's body temperature was cooled to 32 degrees.  Aortic crossclamp was applied, 500 mL cold blood potassium cardioplegia was administered with diastolic arrest of the heart.  Myocardial septal temperature was monitored throughout the crossclamp.  Attention was turned first to the circumflex coronary artery.  The heart  was elevated and the circumflex vessel was opened, and admitted 1.5 mm probe easily, approximately 1.8 mm in size.  Using a running 7-0 Prolene segment, reverse saphenous vein graft was anastomosed to the circumflex coronary artery.  We then turned our attention to the diagonal coronary artery.  This vessel was slightly larger than appreciated on the cine film.  Vessel was opened and admitted a 1.5 mm probe.  Vessel was  approximately 1.4-1.5 mm in size. Using a running 7-0 Prolene, distal anastomosis was performed. Attention was then turned to the distal right coronary artery.  This vessel was significantly calcified in the proximal third of the posterior descending, vessel was relatively small, but did have a soft placed open.  The vessel was open, admitted a 1 mm probe distally. Vessel was 1.2-1.3 mm in size.  Using a running 7-0 Prolene, distal anastomosis was performed.  Additional cold blood cardioplegia was administered down the vein grafts.  Attention was then turned to the left anterior descending coronary artery.  This vessel angiographically had diffuse disease, both proximally and distally.  There was an area in the midportion of the vessel which appeared suitable to open a 1.5 mm probe passed proximally and distally.  Using a running 8-0 Prolene, left internal mammary artery was anastomosed to left anterior descending coronary artery.  With the crossclamp still in place, three punch aortotomies were performed and each of the 3 vein grafts were anastomosed to the ascending aorta.  Air was evacuated from the grafts. Bulldog was removed from the mammary artery with prompt rise in myocardial septal temperature.  Aortic crossclamp was removed.  Total cross-clamp time of 94 minutes.  Sites anastomosed were inspected and free of bleeding.  With the patient's body temperature rewarmed to 37 degrees, he was then ventilated and weaned from cardiopulmonary bypass without difficulty.  He remained hemodynamically stable without pressor support.  He was decannulated in usual fashion.  Protamine sulfate was administered with operative field hemostatic.  Atrial and ventricular pacing wires were applied.  Graft marker was applied.  A left pleural tube and a Blake mediastinal drain were left in place.  Sternum was closed with #6 stainless steel wire.  Fascia was closed with interrupted 0 Vicryl running 3-0  Vicryl subcutaneous tissue, and 4-0 subcuticular stitch in skin edges.  Dry dressings were applied.  Sponge and needle count was reported as correct at completion of procedure.  The patient tolerated the procedure without obvious complication and was transferred to Surgical Intensive care Unit for further postoperative care.  It should be noted that the patient's preoperative labs and labs back to 2013, he was noted to have mild thrombocytopenia and mild anemia with platelet counts between 70 and 90,000.  During the operative procedure, patient's platelet count dropped to the 70,000 range, but without evidence of bleeding, he was not transfused platelets or blood cells. Total pump time was 135 minutes.     Lanelle Bal, MD     EG/MEDQ  D:  04/24/2015  T:  04/24/2015  Job:  [id number]  cc:   Dr. Clayborn Bigness

## 2015-04-24 NOTE — Care Management Important Message (Signed)
Important Message  Patient Details  Name: [name] MRN: [id number] Date of Birth: 03-Dec-1945   Medicare Important Message Given:  Yes    Loann Quill 04/24/2015, 12:53 PM

## 2015-04-24 NOTE — Care Management Note (Signed)
Case Management Note  Patient Details  Name: MELODY KIRCHER MRN: [id number] Date of Birth: 03/15/1946  Subjective/Objective:          Pt is s/p CABG          Action/Plan:  Pt is from home with wife, wife will provide 24 hour supervision post discharge.  Pt ambulated with cardiac rehab without any assistance or devices.  CM will continue to monitor for disposition needs   Expected Discharge Date:                  Expected Discharge Plan:  Home/Self Care  In-House Referral:     Discharge planning Services  CM Consult  Post Acute Care Choice:    Choice offered to:     DME Arranged:    DME Agency:     HH Arranged:    HH Agency:     Status of Service:  In process, will continue to follow  Medicare Important Message Given:  Yes Date Medicare IM Given:    Medicare IM give by:    Date Additional Medicare IM Given:    Additional Medicare Important Message give by:     If discussed at [place] of Stay Meetings, dates discussed:    Additional Comments:  Maryclare Labrador, RN 04/24/2015, 3:07 PM

## 2015-04-24 NOTE — Progress Notes (Signed)
Patient OOB and sitting in recliner.

## 2015-04-24 NOTE — Progress Notes (Signed)
Utilization review completed.  

## 2015-04-24 NOTE — Progress Notes (Signed)
Report called to nurse, Angelica. Patient is stable, alert, and oriented.

## 2015-04-24 NOTE — Progress Notes (Signed)
  Amiodarone Drug - Drug Interaction Consult Note  Recommendations: Monitor CBGs while on amio and glipizide Amiodarone is metabolized by the cytochrome P450 system and therefore has the potential to cause many drug interactions. Amiodarone has an average plasma half-life of 50 days (range 20 to 100 days).   There is potential for drug interactions to occur several weeks or months after stopping treatment and the onset of drug interactions may be slow after initiating amiodarone.   []  Statins: Increased risk of myopathy. Simvastatin- restrict dose to 20mg  daily. Other statins: counsel patients to report any muscle pain or weakness immediately.  []  Anticoagulants: Amiodarone can increase anticoagulant effect. Consider warfarin dose reduction. Patients should be monitored closely and the dose of anticoagulant altered accordingly, remembering that amiodarone levels take several weeks to stabilize.  []  Antiepileptics: Amiodarone can increase plasma concentration of phenytoin, the dose should be reduced. Note that small changes in phenytoin dose can result in large changes in levels. Monitor patient and counsel on signs of toxicity.  []  Beta blockers: increased risk of bradycardia, AV block and myocardial depression. Sotalol - avoid concomitant use.  []   Calcium channel blockers (diltiazem and verapamil): increased risk of bradycardia, AV block and myocardial depression.  []   Cyclosporine: Amiodarone increases levels of cyclosporine. Reduced dose of cyclosporine is recommended.  []  Digoxin dose should be halved when amiodarone is started.  []  Diuretics: increased risk of cardiotoxicity if hypokalemia occurs.  [x]  Oral hypoglycemic agents (glyburide, glipizide, glimepiride): increased risk of hypoglycemia. Patient's glucose levels should be monitored closely when initiating amiodarone therapy.   []  Drugs that prolong the QT interval:  Torsades de pointes risk may be increased with concurrent use  - avoid if possible.  Monitor QTc, also keep magnesium/potassium WNL if concurrent therapy can't be avoided. Marland Kitchen Antibiotics: e.g. fluoroquinolones, erythromycin. . Antiarrhythmics: e.g. quinidine, procainamide, disopyramide, sotalol. . Antipsychotics: e.g. phenothiazines, haloperidol.  . Lithium, tricyclic antidepressants, and methadone. Thank You,  Leodis Sias T  04/24/2015 9:47 AM

## 2015-04-25 ENCOUNTER — Inpatient Hospital Stay (HOSPITAL_COMMUNITY): Payer: Medicare Other

## 2015-04-25 LAB — CBC
HCT: 25.9 % — ABNORMAL LOW (ref 39.0–52.0)
Hemoglobin: 8.7 g/dL — ABNORMAL LOW (ref 13.0–17.0)
MCH: 20.1 pg — ABNORMAL LOW (ref 26.0–34.0)
MCHC: 33.6 g/dL (ref 30.0–36.0)
MCV: 59.8 fL — ABNORMAL LOW (ref 78.0–100.0)
Platelets: 93 10*3/uL — ABNORMAL LOW (ref 150–400)
RBC: 4.33 MIL/uL (ref 4.22–5.81)
RDW: 16.4 % — ABNORMAL HIGH (ref 11.5–15.5)
WBC: 5 10*3/uL (ref 4.0–10.5)

## 2015-04-25 LAB — BASIC METABOLIC PANEL
Anion gap: 7 (ref 5–15)
BUN: 19 mg/dL (ref 6–20)
CO2: 29 mmol/L (ref 22–32)
Calcium: 8.9 mg/dL (ref 8.9–10.3)
Chloride: 102 mmol/L (ref 101–111)
Creatinine, Ser: 1.43 mg/dL — ABNORMAL HIGH (ref 0.61–1.24)
GFR calc Af Amer: 56 mL/min — ABNORMAL LOW (ref >60)
GFR calc non Af Amer: 48 mL/min — ABNORMAL LOW (ref >60)
Glucose, Bld: 118 mg/dL — ABNORMAL HIGH (ref 65–99)
Potassium: 4.7 mmol/L (ref 3.5–5.1)
Sodium: 138 mmol/L (ref 135–145)

## 2015-04-25 LAB — GLUCOSE, CAPILLARY
Glucose-Capillary: 135 mg/dL — ABNORMAL HIGH (ref 65–99)
Glucose-Capillary: 198 mg/dL — ABNORMAL HIGH (ref 65–99)
Glucose-Capillary: 103 mg/dL — ABNORMAL HIGH (ref 65–99)
Glucose-Capillary: 174 mg/dL — ABNORMAL HIGH (ref 65–99)

## 2015-04-25 LAB — TSH: TSH: 5.153 u[IU]/mL — ABNORMAL HIGH (ref 0.350–4.500)

## 2015-04-25 LAB — MAGNESIUM: Magnesium: 1.9 mg/dL (ref 1.7–2.4)

## 2015-04-25 MED ORDER — METFORMIN HCL 500 MG PO TABS
500.0000 mg | ORAL_TABLET | Freq: Two times a day (BID) | ORAL | Status: DC
  Administered 2015-04-25 – 2015-04-26 (×3): 500 mg via ORAL
  Filled 2015-04-25 (×3): qty 1

## 2015-04-25 MED ORDER — GLIPIZIDE 5 MG PO TABS
5.0000 mg | ORAL_TABLET | Freq: Two times a day (BID) | ORAL | Status: DC
  Administered 2015-04-25 – 2015-04-26 (×3): 5 mg via ORAL
  Filled 2015-04-25 (×3): qty 1

## 2015-04-25 NOTE — Discharge Summary (Signed)
Physician Discharge Summary  Patient ID: [name] MRN: [id number] DOB/AGE: 08/11/1945 70 y.o.  Admit date: 04/19/2015 Discharge date: 04/26/2015  Admission Diagnoses:  Patient Active Problem List   Diagnosis Date Noted  . Thrombocytopenia ([place]) 04/20/2015  . CAD (coronary artery disease) 04/19/2015  . Coronary artery disease 04/18/2015  . Unstable angina ([place]) 04/17/2015  . Elevated troponin 04/17/2015  . Type 2 diabetes mellitus ([place]) 04/17/2015  . HTN (hypertension) 04/17/2015   Discharge Diagnoses:   Patient Active Problem List   Diagnosis Date Noted  . 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 Thrombocytopenia ([place]) 04/20/2015  . CAD (coronary artery disease) 04/19/2015  . Coronary artery disease 04/18/2015  . Unstable angina ([place]) 04/17/2015  . Elevated troponin 04/17/2015  . Type 2 diabetes mellitus ([place]) 04/17/2015  . HTN (hypertension) 04/17/2015   Discharged Condition: good  History of Present Illness:  [name] is a 70 yo white male with known history of HTN and DM.  He present to [facility] ED with complains of chest tightness and mild shortness of breath.  Patient states symptoms developed while sitting on the couch.  They persisted and gradually worsened, prompting patient to report to the ED.  Work up in ED showed inferior Q waves on EKG and mild elevation in Troponin.  Cardiology consult was obtained and it was felt patient should undergo cardiac catheterization.  This was done and showed a preserved EF with severe 3 vessel CAD.  It was recommended Coronary bypass procedure be performed.  [facility] was agree[facility] to accept the patient for transfer.  Hospital Course:   Upon arrival to [facility] the patient was chest pain free and denied shortness of breath.  Dr. Servando Snare was in agreement the patient would benefit from CABG procedure.  The risks and benefits of the procedure were explained to the patient and he was agreeable to  proceed.  He was taken to the operating room on 04/21/2015.  He underwent CABG x 4 utilizing LIMA to LAD, SVG to Diagonal, SVG to Left Circumflex, and SVG to PDA.  He also underwent endoscopic harvest of the greater saphenous vein from the right leg.  He tolerated the procedure without difficulty and was taken to the SICU in stable condition.  The patient was extubated the evening of surgery.  During his stay in the SICU the patients chest tubes and arterial lines were removed without difficulty.  He developed rapid Atrial Fibrillation which was subsequently treated with IV Amiodarone with successful conversion to NSR.  He developed Thrombocytopenia and was not placed on Lovenox or ASA therapy.  He was ambulating independently and felt medically stable to transfer to the stepdown unit on POD #3.  The patient continues to make progress.  He had a repeat UA sent since a preoperative culture was not sent.  This was clean and he had completed 5 days of Cipro so all antibiotics were stopped.  His platelet count has improved and he has been started on low dose ASA.  His sugars have been erratic during hospitalization, his medications have been adjusted but we recommend further follow up with PCP.  He continues to ambulate without difficulty.  He is maintaining NSR and his pacing wires have been removed without difficulty.  His dose of Amiodarone was decreased and he was started on a low dose of Amiodarone.  He continues to do well and is felt medically stable for discharge home today.  Significant Diagnostic Studies: angiography:    Dist Cx lesion, 80% stenosed. The lesion was not previously treated.  2nd Mrg lesion, 99% stenosed. The lesion was not previously treated.  1st Diag-1 lesion, 70% stenosed. The lesion was not previously treated.  1st Diag-2 lesion, 99% stenosed. The lesion was not previously treated.  Mid LAD lesion, 60% stenosed. The lesion was not previously treated.  Mid LAD to Dist  LAD lesion, 70% stenosed. The lesion was not previously treated.  Dist LAD lesion, 75% stenosed. The lesion was not previously treated.  Prox RCA lesion, 100% stenosed. The lesion was not previously treated.  normal overall left ventricular function ejection fraction of 55%  suggest coronary bypass surgery for multivessel coronary disease  Treatments: surgery:    Coronary artery bypass grafting x4 with left internal mammary to the left anterior descending coronary artery, reverse saphenous vein graft to the diagonal coronary artery, reverse saphenous vein graft to the circumflex coronary artery and reverse saphenous vein graft to the posterior descending coronary artery with right thigh and upper calf endovein harvesting of the greater saphenous vein.  Disposition: Home  Discharge medications:  The patient has been discharged on:   1.Beta Blocker:  Yes [ x  ]                              No   [   ]                              If No, reason:  2.Ace Inhibitor/ARB: Yes [   ]                                     No  [  x  ]                                     If No, reason: elevated creatinine 3.Statin:   Yes [x   ]                  No  [   ]                  If No, reason:  4.Ecasa:  Yes  [ x  ]                  No   [   ]                  If No, reason:        Medication List    STOP taking these medications        isosorbide mononitrate 30 MG 24 hr tablet  Commonly known as:  IMDUR     losartan 50 MG tablet  Commonly known as:  COZAAR      TAKE these medications        amiodarone 200 MG tablet  Commonly known as:  PACERONE  Take 1 tablet (200 mg total) by mouth 2 (two) times daily. For 10 Days, then decrease to 200 mg daily     aspirin 81 MG tablet  Take 81 mg by mouth daily.     atorvastatin 40 MG tablet  Commonly known as:  LIPITOR  Take 1 tablet (40 mg total) by mouth daily.     folic acid 1 MG tablet  Commonly known as:  FOLVITE  Take 1 tablet (1  mg total) by mouth daily.     glipiZIDE 5 MG tablet  Commonly known as:  GLUCOTROL  Take 1 tablet (5 mg total) by mouth 2 (two) times daily.     guaiFENesin 600 MG 12 hr tablet  Commonly known as:  MUCINEX  Take 2 tablets (1,200 mg total) by mouth 2 (two) times daily.     metFORMIN 500 MG tablet  Commonly known as:  GLUCOPHAGE  Take 500 mg by mouth 2 (two) times daily with a meal.     metoprolol tartrate 25 MG tablet  Commonly known as:  LOPRESSOR  Take 0.5 tablets (12.5 mg total) by mouth 2 (two) times daily.     oxyCODONE 5 MG immediate release tablet  Commonly known as:  Oxy IR/ROXICODONE  Take 1-2 tablets (5-10 mg total) by mouth every 3 (three) hours as needed for severe pain.     traMADol 50 MG tablet  Commonly known as:  ULTRAM  Take 1-2 tablets (50-100 mg total) by mouth every 4 (four) hours as needed for moderate pain.       Follow-up Information    Follow up with Grace Isaac, MD On 05/25/2015.   Specialty:  Cardiothoracic Surgery   Why:  Appointment is at 4:30   Contact information:   [place] [address] [phone]       Follow up with [facility] On 05/25/2015.   Why:  please get CXR at 4:00, located on first floor of [place] information:   [facility]       Follow up with Yolonda Kida., MD.   Specialties:  Cardiology, Internal Medicine   Why:  Please contact office to set up follow up appointment for 2 weeks   Contact information:   [place] [address] [phone]       Signed: Ellwood Handler 04/26/2015, 9:34 AM    Grace Isaac MD [facility] [phone] Office [phone] 04/26/2015 12:07 PM

## 2015-04-25 NOTE — Discharge Instructions (Signed)
Coronary Artery Bypass Grafting, Care After °Refer to this sheet in the next few weeks. These instructions provide you with information on caring for yourself after your procedure. Your health care provider may also give you more specific instructions. Your treatment has been planned according to current medical practices, but problems sometimes occur. Call your health care provider if you have any problems or questions after your procedure. °WHAT TO EXPECT AFTER THE PROCEDURE °Recovery from surgery will be different for everyone. Some people feel well after 3 or 4 weeks, while for others it takes longer. After your procedure, it is typical to have the following: °· Nausea and a lack of appetite.   °· Constipation. °· Weakness and fatigue.   °· Depression or irritability.   °· Pain or discomfort at your incision site. °HOME CARE INSTRUCTIONS °· Take medicines only as directed by your health care provider. Do not stop taking medicines or start any new medicines without first checking with your health care provider. °· Take your pulse as directed by your health care provider. °· Perform deep breathing as directed by your health care provider. If you were given a device called an incentive spirometer, use it to practice deep breathing several times a day. Support your chest with a pillow or your arms when you take deep breaths or cough. °· Keep incision areas clean, dry, and protected. Remove or change any bandages (dressings) only as directed by your health care provider. You may have skin adhesive strips over the incision areas. Do not take the strips off. They will fall off on their own. °· Check incision areas daily for any swelling, redness, or drainage. °· If incisions were made in your legs, do the following: °¨ Avoid crossing your legs.   °¨ Avoid sitting for long periods of time. Change positions every 30 minutes.   °¨ Elevate your legs when you are sitting. °· Wear compression stockings as directed by your  health care provider. These stockings help keep blood clots from forming in your legs. °· Take showers once your health care provider approves. Until then, only take sponge baths. Pat incisions dry. Do not rub incisions with a washcloth or towel. Do not take baths, swim, or use a hot tub until your health care provider approves. °· Eat foods that are high in fiber, such as raw fruits and vegetables, whole grains, beans, and nuts. Meats should be lean cut. Avoid canned, processed, and fried foods. °· Drink enough fluid to keep your urine clear or pale yellow. °· Weigh yourself every day. This helps identify if you are retaining fluid that may make your heart and lungs work harder. °· Rest and limit activity as directed by your health care provider. You may be instructed to: °¨ Stop any activity at once if you have chest pain, shortness of breath, irregular heartbeats, or dizziness. Get help right away if you have any of these symptoms. °¨ Move around frequently for short periods or take short walks as directed by your health care provider. Increase your activities gradually. You may need physical therapy or cardiac rehabilitation to help strengthen your muscles and build your endurance. °¨ Avoid lifting, pushing, or pulling anything heavier than 10 lb (4.5 kg) for at least 6 weeks after surgery. °· Do not drive until your health care provider approves.  °· Ask your health care provider when you may return to work. °· Ask your health care provider when you may resume sexual activity. °· Keep all follow-up visits as directed by your health care   provider. This is important. °SEEK MEDICAL CARE IF: °· You have swelling, redness, increasing pain, or drainage at the site of an incision. °· You have a fever. °· You have swelling in your ankles or legs. °· You have pain in your legs.   °· You gain 2 or more pounds (0.9 kg) a day. °· You are nauseous or vomit. °· You have diarrhea.  °SEEK IMMEDIATE MEDICAL CARE IF: °· You have  chest pain that goes to your jaw or arms. °· You have shortness of breath.   °· You have a fast or irregular heartbeat.   °· You notice a "clicking" in your breastbone (sternum) when you move.   °· You have numbness or weakness in your arms or legs. °· You feel dizzy or light-headed.   °MAKE SURE YOU: °· Understand these instructions. °· Will watch your condition. °· Will get help right away if you are not doing well or get worse. °  °This information is not intended to replace advice given to you by your health care provider. Make sure you discuss any questions you have with your health care provider. °  °Document Released: 10/12/2004 Document Revised: 04/15/2014 Document Reviewed: 09/01/2012 °Elsevier Interactive Patient Education ©2016 [employer] ° °Endoscopic Saphenous Vein Harvesting, Care After °Refer to this sheet in the next few weeks. These instructions provide you with information on caring for yourself after your procedure. Your health care provider may also give you more specific instructions. Your treatment has been planned according to current medical practices, but problems sometimes occur. Call your health care provider if you have any problems or questions after your procedure. °HOME CARE INSTRUCTIONS °Medicine °· Take whatever pain medicine your surgeon prescribes. Follow the directions carefully. Do not take over-the-counter pain medicine unless your surgeon says it is okay. Some pain medicine can cause bleeding problems for several weeks after surgery. °· Follow your surgeon's instructions about driving. You will probably not be permitted to drive after heart surgery. °· Take any medicines your surgeon prescribes. Any medicines you took before your heart surgery should be checked with your health care provider before you start taking them again. °Wound care °· If your surgeon has prescribed an elastic bandage or stocking, ask how long you should wear it. °· Check the area around your surgical  cuts (incisions) whenever your bandages (dressings) are changed. Look for any redness or swelling. °· You will need to return to have the stitches (sutures) or staples taken out. Ask your surgeon when to do that. °· Ask your surgeon when you can shower or bathe. °Activity °· Try to keep your legs raised when you are sitting. °· Do any exercises your health care providers have given you. These may include deep breathing exercises, coughing, walking, or other exercises. °SEEK MEDICAL CARE IF: °· You have any questions about your medicines. °· You have more leg pain, especially if your pain medicine stops working. °· New or growing bruises develop on your leg. °· Your leg swells, feels tight, or becomes red. °· You have numbness in your leg. °SEEK IMMEDIATE MEDICAL CARE IF: °· Your pain gets much worse. °· Blood or fluid leaks from any of the incisions. °· Your incisions become warm, swollen, or red. °· You have chest pain. °· You have trouble breathing. °· You have a fever. °· You have more pain near your leg incision. °MAKE SURE YOU: °· Understand these instructions. °· Will watch your condition. °· Will get help right away if you are not doing well or   get worse. °  °This information is not intended to replace advice given to you by your health care provider. Make sure you discuss any questions you have with your health care provider. °  °Document Released: 12/05/2010 Document Revised: 04/15/2014 Document Reviewed: 12/05/2010 °Elsevier Interactive Patient Education ©2016 [employer] ° ° °

## 2015-04-25 NOTE — Progress Notes (Signed)
CARDIAC REHAB PHASE I   MODE:  Ambulation: 890 ft   POST:  Rate/Rhythm: 112 ST  BP:  Supine:   Sitting: 163/83  Standing:    SaO2: 96%RA 1128-1200 Pt up in hall walking independently with steady gait. Education completed with pt and understanding voiced. Discussed CRP 2 [place] and will send letter of interest. Pt's cardiologist is going to be [place] and cannot write order but will send notification to [facility] as pt interested in attending. Wrote down how to view discharge video. Gave pt heart healthy and diabetic diets and discussed carb counting. Sternal precautions reviewed and ex ed given.   Graylon Good, RN BSN  04/25/2015 11:57 AM

## 2015-04-25 NOTE — Progress Notes (Signed)
Pacing wires pulled. Pt tolerated well. BP and HR stable. Pt finished one hour of bedrest at 2:40PM.  Fritz Pickerel, RN

## 2015-04-25 NOTE — Progress Notes (Addendum)
[address]       [place], [address]             [phone]                 4 Days Post-Op Procedure(s) (LRB): CORONARY ARTERY BYPASS GRAFTING (CABG) X 4 UTILIZING THE LEFT INTERNAL MAMMARY ARTERY TO LAD, ENDOSCOPICALLY HARVESTED RIGHT SAPHENEOUS VEINS TO PD,DIAGONAL AND CIRCUMFLEX. (N/A) TRANSESOPHAGEAL ECHOCARDIOGRAM (TEE) (N/A)  LOS: 6 days   Subjective: Feels  well, ambulating   Objective: Vital signs in last 24 hours: Patient Vitals for the past 24 hrs:  BP Temp Temp src Pulse Resp SpO2 Weight  04/25/15 0433 113/62 mmHg 98 F (36.7 C) Oral 79 16 94 % 211 lb 10.3 oz (96 kg)  04/24/15 1957 136/66 mmHg 99 F (37.2 C) Oral 86 18 97 % -  04/24/15 1257 122/60 mmHg 98.7 F (37.1 C) Oral 71 20 97 % -  04/24/15 1148 - 98.5 F (36.9 C) Oral - - - -  04/24/15 1000 - - - - (!) 21 100 % -    Filed Weights   04/23/15 0500 04/24/15 0500 04/25/15 0433  Weight: 216 lb 14.9 oz (98.4 kg) 212 lb 11.9 oz (96.5 kg) 211 lb 10.3 oz (96 kg)    Hemodynamic parameters for last 24 hours:    Intake/Output from previous day: 01/16 0701 - 01/17 0700 In: 114.3 [I.V.:114.3] Out: 775 [Urine:775] Intake/Output this shift:    Scheduled Meds: . amiodarone  400 mg Oral Q12H   Followed by  . [START ON 05/01/2015] amiodarone  400 mg Oral Daily  . antiseptic oral rinse  7 mL Mouth Rinse BID  . aspirin EC  81 mg Oral Daily  . atorvastatin  40 mg Oral q1800  . ciprofloxacin  250 mg Oral BID  . docusate sodium  200 mg Oral Daily  . folic acid  1 mg Oral Daily  . furosemide  40 mg Oral Daily  . glipiZIDE  2.5 mg Oral BID AC  . glipiZIDE  5 mg Oral BID  . insulin aspart  0-24 Units Subcutaneous TID AC & HS  . insulin detemir  24 Units Subcutaneous BID  . metFORMIN  500 mg Oral BID WC  . moving right along book   Does not apply Once  . pantoprazole  40 mg Oral QAC breakfast  . potassium chloride  20 mEq Oral Daily  . sodium chloride  3 mL Intravenous Q12H  . sodium  chloride  3 mL Intravenous Q12H  . sodium chloride  3 mL Intravenous Q12H   Continuous Infusions: . sodium chloride Stopped (04/22/15 1300)  . sodium chloride    . sodium chloride Stopped (04/22/15 0800)  . lactated ringers Stopped (04/24/15 1130)   PRN Meds:.sodium chloride, sodium chloride, sodium chloride, alum & mag hydroxide-simeth, bisacodyl **OR** bisacodyl, magnesium hydroxide, metoprolol, ondansetron **OR** ondansetron (ZOFRAN) IV, oxyCODONE, sodium chloride, sodium chloride, sodium chloride, traMADol  General appearance: alert, cooperative, appears stated age and no distress Neurologic: intact Heart: regular rate and rhythm, S1, S2 normal, no murmur, click, rub or gallop Lungs: diminished breath sounds bibasilar Abdomen: soft, non-tender; bowel sounds normal; no masses,  no organomegaly Extremities: extremities normal, atraumatic, no cyanosis or edema and Homans sign is negative, no sign of DVT Wound: small amt of serous drainage drainage lower pole of sternum, no evidence of infection  Lab Results: CBC: Recent Labs  04/24/15 0436 04/25/15 0333  WBC 6.3 5.0  HGB 8.7* 8.7*  HCT 26.5* 25.9*  PLT 83* 93*   BMET:  Recent Labs  04/24/15 0436 04/25/15 0333  NA 138 138  K 4.4 4.7  CL 106 102  CO2 27 29  GLUCOSE 165* 118*  BUN 17 19  CREATININE 1.34* 1.43*  CALCIUM 8.6* 8.9    PT/INR: No results for input(s): LABPROT, INR in the last 72 hours.  Radiology Dg Chest 2 View  04/24/2015  CLINICAL DATA:  Status post CABG EXAM: CHEST  2 VIEW COMPARISON:  Yesterday FINDINGS: Stable cardiomediastinal size and contour after recent CABG. Lung volumes remain low and there is stable bibasilar atelectasis, greatest medially on the left. Trace effusions. No pneumothorax. No pulmonary edema. Remaining right IJ sheath in stable position. IMPRESSION: Stable postoperative atelectasis. Electronically Signed   By: Monte Fantasia M.D.   On: 04/24/2015 07:58   HGBA1c  7.5  Chronic  Kidney Disease   Stage I     GFR >90  Stage II    GFR 60-89  Stage IIIA GFR 45-59  Stage IIIB GFR 30-44  Stage IV   GFR 15-29  Stage V    GFR  <15  Lab Results  Component Value Date   CREATININE 1.43* 04/25/2015   Estimated Creatinine Clearance: 56.7 mL/min (by C-G formula based on Cr of 1.43). Assessment/Plan: S/P Procedure(s) (LRB): CORONARY ARTERY BYPASS GRAFTING (CABG) X 4 UTILIZING THE LEFT INTERNAL MAMMARY ARTERY TO LAD, ENDOSCOPICALLY HARVESTED RIGHT SAPHENEOUS VEINS TO PD,DIAGONAL AND CIRCUMFLEX. (N/A) TRANSESOPHAGEAL ECHOCARDIOGRAM (TEE) (N/A) Mobilize Diuresis Diabetes control D/c pacing wires Repeat ua clear has had 5 days of cipro, urine culture was ordered but never made it to lab before starting cipro, will d/c now D/c pacing wires With mild elevation of cr will wait on ace/arb Resume metformin and glibizide  Plan home tomorrow id dm control stable   Grace Isaac MD 04/25/2015 8:32 AM

## 2015-04-26 LAB — BASIC METABOLIC PANEL
Anion gap: 8 (ref 5–15)
BUN: 22 mg/dL — ABNORMAL HIGH (ref 6–20)
CO2: 26 mmol/L (ref 22–32)
Calcium: 8.6 mg/dL — ABNORMAL LOW (ref 8.9–10.3)
Chloride: 102 mmol/L (ref 101–111)
Creatinine, Ser: 1.52 mg/dL — ABNORMAL HIGH (ref 0.61–1.24)
GFR calc Af Amer: 52 mL/min — ABNORMAL LOW (ref >60)
GFR calc non Af Amer: 45 mL/min — ABNORMAL LOW (ref >60)
Glucose, Bld: 132 mg/dL — ABNORMAL HIGH (ref 65–99)
Potassium: 4.2 mmol/L (ref 3.5–5.1)
Sodium: 136 mmol/L (ref 135–145)

## 2015-04-26 LAB — CBC
HCT: 25.5 % — ABNORMAL LOW (ref 39.0–52.0)
Hemoglobin: 8.4 g/dL — ABNORMAL LOW (ref 13.0–17.0)
MCH: 19.4 pg — ABNORMAL LOW (ref 26.0–34.0)
MCHC: 32.9 g/dL (ref 30.0–36.0)
MCV: 58.9 fL — ABNORMAL LOW (ref 78.0–100.0)
Platelets: 105 10*3/uL — ABNORMAL LOW (ref 150–400)
RBC: 4.33 MIL/uL (ref 4.22–5.81)
RDW: 16.2 % — ABNORMAL HIGH (ref 11.5–15.5)
WBC: 4.3 10*3/uL (ref 4.0–10.5)

## 2015-04-26 LAB — GLUCOSE, CAPILLARY: Glucose-Capillary: 139 mg/dL — ABNORMAL HIGH (ref 65–99)

## 2015-04-26 MED ORDER — FOLIC ACID 1 MG PO TABS: 1.0000 mg | ORAL_TABLET | Freq: Every day | ORAL | Status: DC

## 2015-04-26 MED ORDER — OXYCODONE HCL 5 MG PO TABS: 5.0000 mg | ORAL_TABLET | ORAL | Status: DC | PRN

## 2015-04-26 MED ORDER — TRAMADOL HCL 50 MG PO TABS: 50.0000 mg | ORAL_TABLET | ORAL | Status: DC | PRN

## 2015-04-26 MED ORDER — CEPHALEXIN 500 MG PO CAPS: 500.0000 mg | ORAL_CAPSULE | Freq: Three times a day (TID) | ORAL | Status: DC

## 2015-04-26 MED ORDER — METOPROLOL TARTRATE 25 MG PO TABS: 12.5000 mg | ORAL_TABLET | Freq: Two times a day (BID) | ORAL | Status: DC

## 2015-04-26 MED ORDER — GUAIFENESIN ER 600 MG PO TB12
1200.0000 mg | ORAL_TABLET | Freq: Two times a day (BID) | ORAL | Status: DC
Start: 2015-04-26 — End: 2015-05-25

## 2015-04-26 MED ORDER — AMIODARONE HCL 200 MG PO TABS: 200.0000 mg | ORAL_TABLET | Freq: Two times a day (BID) | ORAL | Status: DC

## 2015-04-26 NOTE — Progress Notes (Signed)
Discussed with the patient and his wife and all questioned fully answered. He will call me if any problems arise.   Mateja Dier S [place], RN   

## 2015-04-26 NOTE — Progress Notes (Addendum)
      [name]       [place] [address]             [phone]      5 Days Post-Op Procedure(s) (LRB): CORONARY ARTERY BYPASS GRAFTING (CABG) X 4 UTILIZING THE LEFT INTERNAL MAMMARY ARTERY TO LAD, ENDOSCOPICALLY HARVESTED RIGHT SAPHENEOUS VEINS TO PD,DIAGONAL AND CIRCUMFLEX. (N/A) TRANSESOPHAGEAL ECHOCARDIOGRAM (TEE) (N/A)   Subjective:  [name] complains of difficulty expectorating sputum and pain in his chest with coughing.  He otherwise has no complaints and is ready to go home.  + ambulation  + BM Objective: Vital signs in last 24 hours: Temp:  [98 F (36.7 C)-99.4 F (37.4 C)] 98.2 F (36.8 C) (01/18 0507) Pulse Rate:  [82-90] 82 (01/18 0507) Cardiac Rhythm:  [-] Normal sinus rhythm (01/18 0704) Resp:  [18] 18 (01/18 0507) BP: (125-141)/(61-78) 131/61 mmHg (01/18 0507) SpO2:  [95 %-100 %] 95 % (01/18 0507) Weight:  [207 lb 1.6 oz (93.94 kg)] 207 lb 1.6 oz (93.94 kg) (01/18 0507)  Intake/Output from previous day: 01/17 0701 - 01/18 0700 In: 960 [P.O.:960] Out: 1400 [Urine:1400] Intake/Output this shift: Total I/O In: 240 [P.O.:240] Out: -   General appearance: alert, cooperative and no distress Heart: regular rate and rhythm Lungs: clear to auscultation bilaterally Abdomen: soft, non-tender; bowel sounds normal; no masses,  no organomegaly Extremities: edema trace Wound: clean and dry  Lab Results:  Recent Labs  04/25/15 0333 04/26/15 0245  WBC 5.0 4.3  HGB 8.7* 8.4*  HCT 25.9* 25.5*  PLT 93* 105*   BMET:  Recent Labs  04/25/15 0333 04/26/15 0245  NA 138 136  K 4.7 4.2  CL 102 102  CO2 29 26  GLUCOSE 118* 132*  BUN 19 22*  CREATININE 1.43* 1.52*  CALCIUM 8.9 8.6*    PT/INR: No results for input(s): LABPROT, INR in the last 72 hours. ABG    Component Value Date/Time   PHART 7.286* 04/21/2015 1928   HCO3 22.1 04/21/2015 1928   TCO2 23 04/22/2015 1637   ACIDBASEDEF 5.0* 04/21/2015 1928   O2SAT 98.0 04/21/2015  1928   CBG (last 3)   Recent Labs  04/25/15 1648 04/25/15 2027 04/26/15 0618  GLUCAP 103* 174* 139*    Assessment/Plan: S/P Procedure(s) (LRB): CORONARY ARTERY BYPASS GRAFTING (CABG) X 4 UTILIZING THE LEFT INTERNAL MAMMARY ARTERY TO LAD, ENDOSCOPICALLY HARVESTED RIGHT SAPHENEOUS VEINS TO PD,DIAGONAL AND CIRCUMFLEX. (N/A) TRANSESOPHAGEAL ECHOCARDIOGRAM (TEE) (N/A)  1. CV- remains hemodynamically stable, maintaining NSR- will decrease Amiodarone and start low dose beta blocker 2. Pulm- no acute issues, continue IS 3. Renal- creatinine at 1.52, weight trending down, may be able to d/c Lasix 4. DM- sugars remain controlled 5. Dispo- patient stable, will start low dose beta blocker, decrease Amiodarone, will not restart home ARB due to elevated creatinine  LOS: 7 days    [name], [name] 04/26/2015  Stable for d/c today I have seen and examined [name] and agree with the above assessment  and plan.  Grace Isaac MD [place] [phone] Office [phone] 04/26/2015 12:05 PM

## 2015-04-26 NOTE — Care Management Note (Signed)
Case Management Note  Patient Details  Name: [name] MRN: [id number] Date of Birth: [birth year]  Subjective/Objective:          Pt is s/p CABG          Action/Plan:  Pt is from home with wife, wife will provide 24 hour supervision post discharge.  Pt ambulated with cardiac rehab without any assistance or devices.  CM will continue to monitor for disposition needs   Expected Discharge Date:                  Expected Discharge Plan:  Home/Self Care  In-House Referral:     Discharge planning Services  CM Consult  Post Acute Care Choice:    Choice offered to:     DME Arranged:    DME Agency:     HH Arranged:    HH Agency:     Status of Service:  In process, will continue to follow  Medicare Important Message Given:  Yes Date Medicare IM Given:    Medicare IM give by:    Date Additional Medicare IM Given:    Additional Medicare Important Message give by:     If discussed at [place] of Stay Meetings, dates discussed:    Additional Comments: Pt will discharge home today with wife.  CM assessed prior to discharge; no CM need identified prior to discharge Maryclare Labrador, RN 04/26/2015, 9:36 AM

## 2015-05-10 DIAGNOSIS — I25810 Atherosclerosis of coronary artery bypass graft(s) without angina pectoris: Secondary | ICD-10-CM | POA: Diagnosis not present

## 2015-05-10 DIAGNOSIS — I10 Essential (primary) hypertension: Secondary | ICD-10-CM | POA: Diagnosis not present

## 2015-05-10 DIAGNOSIS — I2510 Atherosclerotic heart disease of native coronary artery without angina pectoris: Secondary | ICD-10-CM | POA: Diagnosis not present

## 2015-05-10 DIAGNOSIS — Z951 Presence of aortocoronary bypass graft: Secondary | ICD-10-CM | POA: Diagnosis not present

## 2015-05-10 DIAGNOSIS — E1159 Type 2 diabetes mellitus with other circulatory complications: Secondary | ICD-10-CM | POA: Diagnosis not present

## 2015-05-22 ENCOUNTER — Other Ambulatory Visit: Payer: Self-pay | Admitting: *Deleted

## 2015-05-22 DIAGNOSIS — Z951 Presence of aortocoronary bypass graft: Secondary | ICD-10-CM

## 2015-05-25 ENCOUNTER — Ambulatory Visit
Admission: RE | Admit: 2015-05-25 | Discharge: 2015-05-25 | Disposition: A | Discharge status: Home / Self Care | Payer: Medicare Other | Source: Ambulatory Visit | Attending: Cardiothoracic Surgery | Admitting: Cardiothoracic Surgery

## 2015-05-25 ENCOUNTER — Encounter: Payer: Self-pay | Admitting: Cardiothoracic Surgery

## 2015-05-25 ENCOUNTER — Ambulatory Visit (INDEPENDENT_AMBULATORY_CARE_PROVIDER_SITE_OTHER): Payer: Self-pay | Admitting: Cardiothoracic Surgery

## 2015-05-25 VITALS — BP 127/73 | HR 62 | Resp 16 | Ht 70.0 in | Wt 198.0 lb

## 2015-05-25 DIAGNOSIS — I25110 Atherosclerotic heart disease of native coronary artery with unstable angina pectoris: Secondary | ICD-10-CM

## 2015-05-25 DIAGNOSIS — R0789 Other chest pain: Secondary | ICD-10-CM | POA: Diagnosis not present

## 2015-05-25 DIAGNOSIS — Z951 Presence of aortocoronary bypass graft: Secondary | ICD-10-CM

## 2015-05-25 DIAGNOSIS — I214 Non-ST elevation (NSTEMI) myocardial infarction: Secondary | ICD-10-CM

## 2015-05-25 NOTE — Patient Instructions (Signed)
    [address]       [place] [address]             [phone]       Coronary Artery Bypass Grafting  Care After  Refer to this sheet in the next few weeks. These instructions provide you with information on caring for yourself after your procedure. Your caregiver may also give you more specific instructions. Your treatment has been planned according to current medical practices, but problems sometimes occur. Call your caregiver if you have any problems or questions after your procedure.  Recovery from open heart surgery will be different for everyone. Some people feel well after 3 or 4 weeks, while for others it takes longer. After heart surgery, it may be normal to:  Not have an appetite, feel nauseated by the smell of food, or only want to eat a small amount.   Be constipated because of changes in your diet, activity, and medicines. Eat foods high in fiber. Add fresh fruits and vegetables to your diet. Stool softeners may be helpful.   Feel sad or unhappy. You may be frustrated or cranky. You may have good days and bad days. Do not give up. Talk to your caregiver if you do not feel better.   Feel weakness and fatigue. You many need physical therapy or cardiac rehabilitation to get your strength back.   Develop an irregular heartbeat called atrial fibrillation. Symptoms of atrial fibrillation are a fast, irregular heartbeat or feelings of fluttery heartbeats, shortness of breath, low blood pressure, and dizziness. If these symptoms develop, see your caregiver right away.  MEDICATION  Have a list of all the medicines you will be taking when you leave the hospital. For every medicine, know the following:   Name.   Exact dose.   Time of day to be taken.   How often it should be taken.   Why you are taking it.   Ask which medicines should or should not be taken together. If you take more than one heart medicine, ask if it is okay to take them together. Some  heart medicines should not be taken at the same time because they may lower your blood pressure too much.   Narcotic pain medicine can cause constipation. Eat fresh fruits and vegetables. Add fiber to your diet. Stool softener medicine may help relieve constipation.   Keep a copy of your medicines with you at all times.   Do not add or stop taking any medicine until you check with your caregiver.   Medicines can have side effects. Call your caregiver who prescribed the medicine if you:   Start throwing up, have diarrhea, or have stomach pain.   Feel dizzy or lightheaded when you stand up.   Feel your heart is skipping beats or is beating too fast or too slow.   Develop a rash.   Notice unusual bruising or bleeding.  HOME CARE INSTRUCTIONS  After heart surgery, it is important to learn how to take your pulse. Have your caregiver show you how to take your pulse.   Use your incentive spirometer. Ask your caregiver how long after surgery you need to use it.  Care of your chest incision  Tell your caregiver right away if you notice clicking in your chest (sternum).   Support your chest with a pillow or your arms when you take deep breaths and cough.   Follow your caregiver's instructions about when you can bathe or   swim.   Protect your incision from sunlight during the first year to keep the scar from getting dark.   Tell your caregiver if you notice:   Increased tenderness of your incision.   Increased redness or swelling around your incision.   Drainage or pus from your incision.  Care of your leg incision(s)  Avoid crossing your legs.   Avoid sitting for long periods of time. Change positions every half hour.   Elevate your leg(s) when you are sitting.   Check your leg(s) daily for swelling. Check the incisions for redness or drainage.   Diet is very important to heart health.   Eat plenty of fresh fruits and vegetables. Meats should be lean cut. Avoid canned,  processed, and fried foods.   Talk to a dietician. They can teach you how to make healthy food and drink choices.  Weight  Weigh yourself every day. This is important because it helps to know if you are retaining fluid that may make your heart and lungs work harder.   Use the same scale each time.   Weigh yourself every morning at the same time. You should do this after you go to the bathroom, but before you eat breakfast.   Your weight will be more accurate if you do not wear any clothes.   Record your weight.   Tell your caregiver if you have gained 2 pounds or more overnight.  Activity Stop any activity at once if you have chest pain, shortness of breath, irregular heartbeats, or dizziness. Get help right away if you have any of these symptoms.  Bathing.  Avoid soaking in a bath or hot tub until your incisions are healed.   Rest. You need a balance of rest and activity.   Exercise. Exercise per your caregiver's advice. You may need physical therapy or cardiac rehabilitation to help strengthen your muscles and build your endurance.   Climbing stairs. Unless your caregiver tells you not to climb stairs, go up stairs slowly and rest if you tire. Do not pull yourself up by the handrail.   Driving a car. Follow your caregiver's advice on when you may drive. You may ride as a passenger at any time. When traveling for long periods of time in a car, get out of the car and walk around for a few minutes every 2 hours.   Lifting. Avoid lifting, pushing, or pulling anything heavier than 10 pounds for 6 weeks after surgery or as told by your caregiver.   Returning to work. Check with your caregiver. People heal at different rates. Most people will be able to go back to work 6 to 12 weeks after surgery.   Sexual activity. You may resume sexual relations as told by your caregiver.  SEEK MEDICAL CARE IF:  Any of your incisions are red, painful, or have any type of drainage coming from them.     You have an oral temperature above 101.5 F .   You have ankle or leg swelling.   You have pain in your legs.   You have weight gain of 2 or more pounds a day.   You feel dizzy or lightheaded when you stand up.  SEEK IMMEDIATE MEDICAL CARE IF:  You have angina or chest pain that goes to your jaw or arms. Call your local emergency services right away.   You have shortness of breath at rest or with activity.   You have a fast or irregular heartbeat (arrhythmia).   There is   a "clicking" in your sternum when you move.   You have numbness or weakness in your arms or legs.  MAKE SURE YOU:  Understand these instructions.   Will watch your condition.   Will get help right away if you are not doing well or get worse.    No lifting over 25 lbs for 3 months 

## 2015-05-25 NOTE — Progress Notes (Signed)
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 SURGICAL PROCEDURE: Coronary artery bypass grafting x4 with left internal mammary to the left anterior descending coronary artery, reverse saphenous vein graft to the diagonal coronary artery, reverse saphenous vein graft to the circumflex coronary artery and reverse saphenous vein graft to the posterior descending coronary artery with right thigh and upper calf endovein harvesting of the greater saphenous vein. SURGEON: Lanelle Bal, MD  History of Present Illness:     Patient's doing well after his recent coronary artery bypass grafting January 13. He's walking up to 2 miles a day without difficulty. He's had no recurrent angina or evidence of congestive heart failure He notes that his blood glucose has been much better controlled over the past several weeks.    Past Medical History  Diagnosis Date  . Hypertension   . Coronary artery disease   . Hyperlipidemia   . Type II diabetes mellitus [facility])    Past Surgical History  Procedure Laterality Date  . Dupuytren contracture release Left ~ 2000  . Knee arthroscopy Right ~ 1995  . Colonoscopy  2014    cleared for 10 yrs  . Cardiac catheterization N/A 04/18/2015    Procedure: Left Heart Cath and Coronary Angiography;  Surgeon: Yolonda Kida, MD;  Location: [facility];   Service: Cardiovascular;  Laterality: N/A;  . Coronary artery bypass graft N/A 04/21/2015    Procedure: CORONARY ARTERY BYPASS GRAFTING (CABG) X 4 UTILIZING THE LEFT INTERNAL MAMMARY ARTERY TO LAD, ENDOSCOPICALLY HARVESTED RIGHT SAPHENEOUS VEINS TO PD,DIAGONAL AND CIRCUMFLEX.;  Surgeon: Grace Isaac, MD;  Location: [facility];  Service: Open Heart Surgery;  Laterality: N/A;  . Tee without cardioversion N/A 04/21/2015    Procedure: TRANSESOPHAGEAL ECHOCARDIOGRAM (TEE);  Surgeon: Grace Isaac, MD;  Location: [facility];  Service: Open Heart Surgery;  Laterality: N/A;    History  Smoking status  . Never Smoker   Smokeless tobacco  . Never Used    History  Alcohol Use  . 0.0 oz/week  . 0 Standard drinks or equivalent per week    Comment: 04/19/2015 "might have a drink a couple times/yr; at most"     No Known Allergies  Current Outpatient Prescriptions  Medication Sig Dispense Refill  . amiodarone (PACERONE) 200 MG tablet Take 1 tablet (200 mg total) by mouth 2 (two) times daily. For 10 Days, then decrease to 200 mg daily 60 tablet 3  . aspirin 81 MG tablet Take 81 mg by mouth daily.    Marland Kitchen atorvastatin (LIPITOR) 40 MG tablet Take 1 tablet (40 mg total) by mouth daily. 30 tablet 6  . folic acid (FOLVITE) 1 MG tablet Take 1 tablet (1 mg total) by mouth daily. 30 tablet 0  . glipiZIDE (GLUCOTROL) 5 MG tablet Take 1 tablet (5 mg total) by mouth 2 (two) times daily. 180 tablet 2  . metFORMIN (GLUCOPHAGE)  500 MG tablet Take 500 mg by mouth 2 (two) times daily with a meal.    . metoprolol tartrate (LOPRESSOR) 25 MG tablet Take 0.5 tablets (12.5 mg total) by mouth 2 (two) times daily. 60 tablet 6   No current facility-administered medications for this visit.       Physical Exam: BP 127/73 mmHg  Pulse 62  Resp 16  Ht 5\' 10"  (1.778 m)  Wt 198 lb (89.812 kg)  BMI 28.41 kg/m2  SpO2 97%  General appearance: alert and cooperative Neurologic: intact Heart: regular rate and rhythm, S1, S2  normal, no murmur, click, rub or gallop Lungs: clear to auscultation bilaterally Abdomen: soft, non-tender; bowel sounds normal; no masses,  no organomegaly Extremities: extremities normal, atraumatic, no cyanosis or edema and Homans sign is negative, no sign of DVT Wound: Sternum is stable and well healed   Diagnostic Studies & Laboratory data:     Recent Radiology Findings:   Dg Chest 2 View  05/25/2015  CLINICAL DATA:  CABG on April 21, 2015; persistent slight anterior chest pain near the incision; no other complaints. EXAM: CHEST  2 VIEW COMPARISON:  PA and lateral chest x-ray of April 25, 2015. FINDINGS: The lungs are adequately inflated and clear. There is no pleural effusion. Left lower lobe atelectasis has cleared. The heart and pulmonary vascularity are normal. The mediastinum is normal in width. The sternal wires are intact. The retrosternal soft tissues appear normal. IMPRESSION: There is no active cardiopulmonary disease. Electronically Signed   By: David  Martinique M.D.   On: 05/25/2015 15:55      Recent Lab Findings: Lab Results  Component Value Date   WBC 4.3 04/26/2015   HGB 8.4* 04/26/2015   HCT 25.5* 04/26/2015   PLT 105* 04/26/2015   GLUCOSE 132* 04/26/2015   CHOL 160 12/01/2014   TRIG 125 12/01/2014   HDL 32* 12/01/2014   LDLCALC 103* 12/01/2014   ALT 28 04/20/2015   AST 23 04/20/2015   NA 136 04/26/2015   K 4.2 04/26/2015   CL 102 04/26/2015   CREATININE 1.52* 04/26/2015   BUN 22* 04/26/2015   CO2 26 04/26/2015   TSH 5.153* 04/25/2015   INR 1.51* 04/21/2015   HGBA1C 7.5* 04/21/2015    Chronic Kidney Disease  Stage I GFR >90 Stage II GFR 60-89 Stage IIIA GFR 45-59 Stage IIIB GFR 30-44 Stage IV GFR 15-29 Stage V GFR <15   Recent Labs    Lab Results  Component Value Date   CREATININE 1.34* 04/24/2015     Estimated Creatinine Clearance: 60.6 mL/min  (by C-G formula based on Cr of 1.34).       Assessment / Plan:      Patient doing well following recent coronary artery bypass grafting Chronic renal insufficiency stage IIIa to  Stage II Diabetes with, creating factors of renal insufficiency and coronary artery disease  Allow the patient to return to driving, encouraged him to enroll in cardiac rehabilitation program at [facility] closely followed in [facility] by cardiology have not made a return appointment for him to see me in [place] but be glad to see him at his or Dr. Marthenia Rolling requested anytime  Grace Isaac MD      [address] [place] [address] Office [phone]   Beeper [phone]  05/25/2015 4:54 PM

## 2015-06-20 DIAGNOSIS — I2510 Atherosclerotic heart disease of native coronary artery without angina pectoris: Secondary | ICD-10-CM | POA: Diagnosis not present

## 2015-06-20 DIAGNOSIS — I10 Essential (primary) hypertension: Secondary | ICD-10-CM | POA: Diagnosis not present

## 2015-06-20 DIAGNOSIS — I25810 Atherosclerosis of coronary artery bypass graft(s) without angina pectoris: Secondary | ICD-10-CM | POA: Diagnosis not present

## 2015-06-20 DIAGNOSIS — E1159 Type 2 diabetes mellitus with other circulatory complications: Secondary | ICD-10-CM | POA: Diagnosis not present

## 2015-06-29 ENCOUNTER — Encounter: Payer: Self-pay | Admitting: *Deleted

## 2015-06-29 ENCOUNTER — Encounter: Payer: Medicare Other | Attending: Cardiology | Admitting: *Deleted

## 2015-06-29 VITALS — BP 132/80 | HR 57 | Ht 70.1 in | Wt 209.2 lb

## 2015-06-29 DIAGNOSIS — Z951 Presence of aortocoronary bypass graft: Secondary | ICD-10-CM | POA: Diagnosis not present

## 2015-06-29 NOTE — Progress Notes (Signed)
Cardiac Individual Treatment Plan  Patient Details  Name: [name] MRN: [id number] Date of Birth: December 08, 1945 Referring Provider:  Teodoro Spray, MD  Initial Encounter Date:       Cardiac Rehab from 06/29/2015 in [facility] Cardiac [facility] Rehab   Date  06/29/15      Visit Diagnosis: S/P CABG x 4  Patient's Home Medications on Admission:  Current outpatient prescriptions:  .  amiodarone (PACERONE) 200 MG tablet, Take 1 tablet (200 mg total) by mouth 2 (two) times daily. For 10 Days, then decrease to 200 mg daily, Disp: 60 tablet, Rfl: 3 .  aspirin 81 MG tablet, Take 81 mg by mouth daily., Disp: , Rfl:  .  atorvastatin (LIPITOR) 40 MG tablet, Take 1 tablet (40 mg total) by mouth daily., Disp: 30 tablet, Rfl: 6 .  folic acid (FOLVITE) 1 MG tablet, Take 1 tablet (1 mg total) by mouth daily. (Patient not taking: Reported on 06/29/2015), Disp: 30 tablet, Rfl: 0 .  glipiZIDE (GLUCOTROL) 5 MG tablet, Take 1 tablet (5 mg total) by mouth 2 (two) times daily., Disp: 180 tablet, Rfl: 2 .  losartan (COZAAR) 50 MG tablet, Take by mouth., Disp: , Rfl:  .  metFORMIN (GLUCOPHAGE) 500 MG tablet, Take 500 mg by mouth 2 (two) times daily with a meal., Disp: , Rfl:  .  metoprolol tartrate (LOPRESSOR) 25 MG tablet, Take 0.5 tablets (12.5 mg total) by mouth 2 (two) times daily., Disp: 60 tablet, Rfl: 6  Past Medical History: Past Medical History  Diagnosis Date  . Hypertension   . Coronary artery disease   . Hyperlipidemia   . Type II diabetes mellitus (HCC)     Tobacco Use: History  Smoking status  . Never Smoker   Smokeless tobacco  . Never Used    Labs: Recent Review Flowsheet Data    Labs for ITP Cardiac and Pulmonary Rehab Latest Ref Rng 04/21/2015 04/21/2015 04/21/2015 04/21/2015 04/22/2015   PHART 7.350 - 7.450 7.392 7.337(L) 7.286(L) - -   PCO2ART 35.0 - 45.0 mmHg 38.5 42.5 46.2(H) - -   HCO3 20.0 - 24.0 mEq/L 23.5 22.8 22.1 - -   TCO2 0 - 100 mmol/L 25 24 23 22 23     ACIDBASEDEF 0.0 - 2.0 mmol/L 1.0 3.0(H) 5.0(H) - -   O2SAT - 94.0 97.0 98.0 - -       Exercise Target Goals:    Exercise Program Goal: Individual exercise prescription set with THRR, safety & activity barriers. Participant demonstrates ability to understand and report RPE using BORG scale, to self-measure pulse accurately, and to acknowledge the importance of the exercise prescription.  Exercise Prescription Goal: Starting with aerobic activity 30 plus minutes a day, 3 days per week for initial exercise prescription. Provide home exercise prescription and guidelines that participant acknowledges understanding prior to discharge.  Activity Barriers & Risk Stratification:     Activity Barriers & Cardiac Risk Stratification - 06/29/15 1242    Activity Barriers & Cardiac Risk Stratification   Activity Barriers None   Cardiac Risk Stratification High      6 Minute Walk:     6 Minute Walk      06/29/15 1301       6 Minute Walk   Phase Initial     Distance 1495 feet     Walk Time 6 minutes     # of Rest Breaks 0     RPE 11     Symptoms No  Resting HR 60 bpm     Resting BP 132/80 mmHg     Max Ex. HR 90 bpm     Max Ex. BP 128/74 mmHg        Initial Exercise Prescription:     Initial Exercise Prescription - 06/29/15 1300    Date of Initial Exercise Prescription   Date 06/29/15   Treadmill   MPH 3   Grade 0   Minutes 10   Recumbant Bike   Level 3   RPM 40   Watts 20   NuStep   Level 2   Watts 40   Minutes 10   Arm Ergometer   Level 1   Watts 10   Minutes 10   Recumbant Elliptical   Level 2   RPM 40   Watts 20   Minutes 10   REL-XR   Level 3   Watts 50   Minutes 10   T5 Nustep   Level 1   Watts 20   Minutes 10   Biostep-RELP   Level 2   Watts 40   Minutes 10   Prescription Details   Frequency (times per week) 3   Duration Progress to 45 minutes of aerobic exercise without signs/symptoms of physical distress   Intensity   THRR REST +  30    Ratings of Perceived Exertion 11-15   Perceived Dyspnea 0-4   Progression   Progression Continue progressive overload as per policy without signs/symptoms or physical distress.   Resistance Training   Training Prescription Yes   Weight 2   Reps 10-15      Perform Capillary Blood Glucose checks as needed.  Exercise Prescription Changes:   Exercise Comments:   Discharge Exercise Prescription (Final Exercise Prescription Changes):   Nutrition:  Target Goals: Understanding of nutrition guidelines, daily intake of sodium 1500mg , cholesterol 200mg , calories 30% from fat and 7% or less from saturated fats, daily to have 5 or more servings of fruits and vegetables.  Biometrics:     Pre Biometrics - 06/29/15 1302    Pre Biometrics   Height 5' 10.1" (1.781 m)   Weight 209 lb 3.2 oz (94.892 kg)   Waist Circumference 42 inches   Hip Circumference 41 inches   Waist to Hip Ratio 1.02 %   BMI (Calculated) 30       Nutrition Therapy Plan and Nutrition Goals:     Nutrition Therapy & Goals - 06/29/15 1347    Nutrition Therapy   Drug/Food Interactions Statins/Certain Fruits   Intervention Plan   Intervention Prescribe, educate and counsel regarding individualized specific dietary modifications aiming towards targeted core components such as weight, hypertension, lipid management, diabetes, heart failure and other comorbidities.   Expected Outcomes Short Term Goal: Understand basic principles of dietary content, such as calories, fat, sodium, cholesterol and nutrients.;Long Term Goal: Adherence to prescribed nutrition plan.      Nutrition Discharge: Rate Your Plate Scores:   Nutrition Goals Re-Evaluation:   Psychosocial: Target Goals: Acknowledge presence or absence of depression, maximize coping skills, provide positive support system. Participant is able to verbalize types and ability to use techniques and skills needed for reducing stress and depression.  Initial  Review & Psychosocial Screening:     Initial Psych Review & Screening - 06/29/15 [place]? Yes   Barriers   Psychosocial barriers to participate in program There are no identifiable barriers or psychosocial needs.   Screening Interventions  Interventions Encouraged to exercise      Quality of Life Scores:     Quality of Life - 06/29/15 1339    Quality of Life Scores   Health/Function Pre 21.33 %   Socioeconomic Pre 22.5 %   Psych/Spiritual Pre 21.71 %   Family Pre 22.5 %   GLOBAL Pre 21.82 %      PHQ-9:     Recent Review Flowsheet Data    Depression screen [facility] 2/9 06/29/2015 12/01/2014   Decreased Interest 0 0   Down, Depressed, Hopeless 0 0   PHQ - 2 Score 0 0   Altered sleeping 0 -   Tired, decreased energy 1 -   Change in appetite 1 -   Feeling bad or failure about yourself  0 -   Trouble concentrating 0 -   Moving slowly or fidgety/restless 0 -   Suicidal thoughts 0 -   PHQ-9 Score 2 -   Difficult doing work/chores Not difficult at all -      Psychosocial Evaluation and Intervention:   Psychosocial Re-Evaluation:   Vocational Rehabilitation: Provide vocational rehab assistance to qualifying candidates.   Vocational Rehab Evaluation & Intervention:     Vocational Rehab - 06/29/15 1242    Initial Vocational Rehab Evaluation & Intervention   Assessment shows need for Vocational Rehabilitation No      Education: Education Goals: Education classes will be provided on a weekly basis, covering required topics. Participant will state understanding/return demonstration of topics presented.  Learning Barriers/Preferences:     Learning Barriers/Preferences - 06/29/15 1242    Learning Barriers/Preferences   Learning Barriers None   Learning Preferences None      Education Topics: General Nutrition Guidelines/Fats and Fiber: -Group instruction provided by verbal, written material, models and posters to present  the general guidelines for heart healthy nutrition. Gives an explanation and review of dietary fats and fiber.   Controlling Sodium/Reading Food Labels: -Group verbal and written material supporting the discussion of sodium use in heart healthy nutrition. Review and explanation with models, verbal and written materials for utilization of the food label.   Exercise Physiology & Risk Factors: - Group verbal and written instruction with models to review the exercise physiology of the cardiovascular system and associated critical values. Details cardiovascular disease risk factors and the goals associated with each risk factor.   Aerobic Exercise & Resistance Training: - Gives group verbal and written discussion on the health impact of inactivity. On the components of aerobic and resistive training programs and the benefits of this training and how to safely progress through these programs.   Flexibility, Balance, General Exercise Guidelines: - Provides group verbal and written instruction on the benefits of flexibility and balance training programs. Provides general exercise guidelines with specific guidelines to those with heart or lung disease. Demonstration and skill practice provided.   Stress Management: - Provides group verbal and written instruction about the health risks of elevated stress, cause of high stress, and healthy ways to reduce stress.   Depression: - Provides group verbal and written instruction on the correlation between heart/lung disease and depressed mood, treatment options, and the stigmas associated with seeking treatment.   Anatomy & Physiology of the Heart: - Group verbal and written instruction and models provide basic cardiac anatomy and physiology, with the coronary electrical and arterial systems. Review of: AMI, Angina, Valve disease, Heart Failure, Cardiac Arrhythmia, Pacemakers, and the ICD.   Cardiac Procedures: - Group verbal and written instruction  and models  to describe the testing methods done to diagnose heart disease. Reviews the outcomes of the test results. Describes the treatment choices: Medical Management, Angioplasty, or Coronary Bypass Surgery.   Cardiac Medications: - Group verbal and written instruction to review commonly prescribed medications for heart disease. Reviews the medication, class of the drug, and side effects. Includes the steps to properly store meds and maintain the prescription regimen.   Go Sex-Intimacy & Heart Disease, Get SMART - Goal Setting: - Group verbal and written instruction through game format to discuss heart disease and the return to sexual intimacy. Provides group verbal and written material to discuss and apply goal setting through the application of the S.M.A.R.T. Method.   Other Matters of the Heart: - Provides group verbal, written materials and models to describe Heart Failure, Angina, Valve Disease, and Diabetes in the realm of heart disease. Includes description of the disease process and treatment options available to the cardiac patient.   Exercise & Equipment Safety: - Individual verbal instruction and demonstration of equipment use and safety with use of the equipment.          Cardiac Rehab from 06/29/2015 in [facility] Cardiac and Pulmonary Rehab   Date  06/29/15   Educator  C. EnterkinRN   Instruction Review Code  1- partially meets, needs review/practice      Infection Prevention: - Provides verbal and written material to individual with discussion of infection control including proper hand washing and proper equipment cleaning during exercise session.      Cardiac Rehab from 06/29/2015 in [facility]iac [facility] Pulmonary Rehab   Date  06/29/15   Educator  C. EnterkinRN   Instruction Review Code  2- meets goals/outcomes      Falls Prevention: - Provides verbal and written material to individual with discussion of falls prevention and safety.      Cardiac Rehab from 06/29/2015 in  [facility] Cardiac and Pulmonary Rehab   Date  06/29/15   Educator  C. [place]   Instruction Review Code  2- meets goals/outcomes      Diabetes: - Individual verbal and written instruction to review signs/symptoms of diabetes, desired ranges of glucose level fasting, after meals and with exercise. Advice that pre and post exercise glucose checks will be done for 3 sessions at entry of program.      Cardiac Rehab from 06/29/2015 in [facility] Cardiac and Pulmonary Rehab   Date  06/29/15   Educator  C. EnterkinRN   Instruction Review Code  2- meets goals/outcomes       Knowledge Questionnaire Score:     Knowledge Questionnaire Score - 06/29/15 1343    Knowledge Questionnaire Score   Pre Score 25      Core Components/Risk Factors/Patient Goals at Admission:     Personal Goals and Risk Factors at Admission - 06/29/15 1347    Core Components/Risk Factors/Patient Goals on Admission    Weight Management Yes   Intervention Weight Management: Develop a combined nutrition and exercise program designed to reach desired caloric intake, while maintaining appropriate intake of nutrient and fiber, sodium and fats, and appropriate energy expenditure required for the weight goal.;Weight Management/Obesity: Establish reasonable short term and long term weight goals.;Obesity: Provide education and appropriate resources to help participant work on and attain dietary goals.   Admit Weight 209 lb 3.2 oz (94.892 kg)   Goal Weight: Short Term 205 lb (92.987 kg)   Goal Weight: Long Term 190 lb (86.183 kg)   Diabetes Yes   Intervention Provide  education about signs/symptoms and action to take for hypo/hyperglycemia.;Provide education about proper nutrition, including hydration, and aerobic/resistive exercise prescription along with prescribed medications to achieve blood glucose in normal ranges: Fasting glucose 65-99 mg/dL   Expected Outcomes Short Term: Participant verbalizes understanding of the signs/symptoms  and immediate care of hyper/hypoglycemia, proper foot care and importance of medication, aerobic/resistive exercise and nutrition plan for blood glucose control.;Long Term: Attainment of HbA1C < 7%.      Core Components/Risk Factors/Patient Goals Review:    Core Components/Risk Factors/Patient Goals at Discharge (Final Review):    ITP Comments:     ITP Comments      06/29/15 1351 06/29/15 1548         ITP Comments " [name]" Blessed keeps very detailed records of his blood sugars which fasting have been 90s or high 80s. He checks his blood pressure every day and it has been up a little 140s so MD started him on new medicine. See med list.  30 Day Review. Continue with the ITP.  New to program         Comments:

## 2015-06-29 NOTE — Progress Notes (Signed)
Daily Session Note  Patient Details  Name: SHAREEF EDDINGER MRN: [id number] Date of Birth: [birth year] Referring Provider:  Teodoro Spray, MD  Encounter Date: 06/29/2015  Check In:     Session Check In - 06/29/15 1242    Check-In   Location (p) [facility]-Cardiac & Pulmonary Rehab   Staff Present (p) Gerlene Burdock, RN, BSN;Steven Way, BS, ACSM EP-C, Exercise Physiologist   Fall or balance concerns reported    (p) No   Warm-up and Cool-down (p) Not performed (comment)   Resistance Training Performed (p) No   VAD Patient? (p) No   Pain Assessment   Currently in Pain? (p) No/denies         Goals Met:  Personal goals reviewed  Goals Unmet:  Not Applicable  Comments: Medical review/orientation appt done today including 6 minute walk.    Dr. Emily Filbert is Medical Director for [place] and [facility].

## 2015-06-29 NOTE — Progress Notes (Signed)
Cardiac Individual Treatment Plan  Patient Details  Name: [name] MRN: [id number] Date of Birth: [birth year] Referring Provider:  Teodoro Spray, MD  Initial Encounter Date:       Cardiac Rehab from 06/29/2015 in [facility] Cardiac and Pulmonary Rehab   Date  06/29/15      Visit Diagnosis: S/P CABG x 4  Patient's Home Medications on Admission:  Current outpatient prescriptions:  .  amiodarone (PACERONE) 200 MG tablet, Take 1 tablet (200 mg total) by mouth 2 (two) times daily. For 10 Days, then decrease to 200 mg daily, Disp: 60 tablet, Rfl: 3 .  aspirin 81 MG tablet, Take 81 mg by mouth daily., Disp: , Rfl:  .  atorvastatin (LIPITOR) 40 MG tablet, Take 1 tablet (40 mg total) by mouth daily., Disp: 30 tablet, Rfl: 6 .  glipiZIDE (GLUCOTROL) 5 MG tablet, Take 1 tablet (5 mg total) by mouth 2 (two) times daily., Disp: 180 tablet, Rfl: 2 .  losartan (COZAAR) 50 MG tablet, Take by mouth., Disp: , Rfl:  .  metFORMIN (GLUCOPHAGE) 500 MG tablet, Take 500 mg by mouth 2 (two) times daily with a meal., Disp: , Rfl:  .  metoprolol tartrate (LOPRESSOR) 25 MG tablet, Take 0.5 tablets (12.5 mg total) by mouth 2 (two) times daily., Disp: 60 tablet, Rfl: 6 .  folic acid (FOLVITE) 1 MG tablet, Take 1 tablet (1 mg total) by mouth daily. (Patient not taking: Reported on 06/29/2015), Disp: 30 tablet, Rfl: 0  Past Medical History: Past Medical History  Diagnosis Date  . Hypertension   . Coronary artery disease   . Hyperlipidemia   . Type II diabetes mellitus (HCC)     Tobacco Use: History  Smoking status  . Never Smoker   Smokeless tobacco  . Never Used    Labs: Recent Review Flowsheet Data    Labs for ITP Cardiac and Pulmonary Rehab Latest Ref Rng 04/21/2015 04/21/2015 04/21/2015 04/21/2015 04/22/2015   PHART 7.350 - 7.450 7.392 7.337(L) 7.286(L) - -   PCO2ART 35.0 - 45.0 mmHg 38.5 42.5 46.2(H) - -   HCO3 20.0 - 24.0 mEq/L 23.5 22.8 22.1 - -   TCO2 0 - 100 mmol/L 25 24 23 22 23     ACIDBASEDEF 0.0 - 2.0 mmol/L 1.0 3.0(H) 5.0(H) - -   O2SAT - 94.0 97.0 98.0 - -       Exercise Target Goals: Date: 06/29/15  Exercise Program Goal: Individual exercise prescription set with THRR, safety & activity barriers. Participant demonstrates ability to understand and report RPE using BORG scale, to self-measure pulse accurately, and to acknowledge the importance of the exercise prescription.  Exercise Prescription Goal: Starting with aerobic activity 30 plus minutes a day, 3 days per week for initial exercise prescription. Provide home exercise prescription and guidelines that participant acknowledges understanding prior to discharge.  Activity Barriers & Risk Stratification:     Activity Barriers & Cardiac Risk Stratification - 06/29/15 1242    Activity Barriers & Cardiac Risk Stratification   Activity Barriers None   Cardiac Risk Stratification High      6 Minute Walk:     6 Minute Walk      06/29/15 1301       6 Minute Walk   Phase Initial     Distance 1495 feet     Walk Time 6 minutes     # of Rest Breaks 0     RPE 11     Symptoms No  Resting HR 60 bpm     Resting BP 132/80 mmHg     Max Ex. HR 90 bpm     Max Ex. BP 128/74 mmHg        Initial Exercise Prescription:     Initial Exercise Prescription - 06/29/15 1300    Date of Initial Exercise Prescription   Date 06/29/15   Treadmill   MPH 3   Grade 0   Minutes 10   Recumbant Bike   Level 3   RPM 40   Watts 20   NuStep   Level 2   Watts 40   Minutes 10   Arm Ergometer   Level 1   Watts 10   Minutes 10   Recumbant Elliptical   Level 2   RPM 40   Watts 20   Minutes 10   REL-XR   Level 3   Watts 50   Minutes 10   T5 Nustep   Level 1   Watts 20   Minutes 10   Biostep-RELP   Level 2   Watts 40   Minutes 10   Prescription Details   Frequency (times per week) 3   Duration Progress to 45 minutes of aerobic exercise without signs/symptoms of physical distress   Intensity    THRR REST +  30   Ratings of Perceived Exertion 11-15   Perceived Dyspnea 0-4   Progression   Progression Continue progressive overload as per policy without signs/symptoms or physical distress.   Resistance Training   Training Prescription Yes   Weight 2   Reps 10-15      Perform Capillary Blood Glucose checks as needed.  Exercise Prescription Changes:   Exercise Comments:   Discharge Exercise Prescription (Final Exercise Prescription Changes):   Nutrition:  Target Goals: Understanding of nutrition guidelines, daily intake of sodium 1500mg , cholesterol 200mg , calories 30% from fat and 7% or less from saturated fats, daily to have 5 or more servings of fruits and vegetables.  Biometrics:     Pre Biometrics - 06/29/15 1302    Pre Biometrics   Height 5' 10.1" (1.781 m)   Weight 209 lb 3.2 oz (94.892 kg)   Waist Circumference 42 inches   Hip Circumference 41 inches   Waist to Hip Ratio 1.02 %   BMI (Calculated) 30       Nutrition Therapy Plan and Nutrition Goals:     Nutrition Therapy & Goals - 06/29/15 1347    Nutrition Therapy   Drug/Food Interactions Statins/Certain Fruits   Intervention Plan   Intervention Prescribe, educate and counsel regarding individualized specific dietary modifications aiming towards targeted core components such as weight, hypertension, lipid management, diabetes, heart failure and other comorbidities.   Expected Outcomes Short Term Goal: Understand basic principles of dietary content, such as calories, fat, sodium, cholesterol and nutrients.;Long Term Goal: Adherence to prescribed nutrition plan.      Nutrition Discharge: Rate Your Plate Scores:   Nutrition Goals Re-Evaluation:   Psychosocial: Target Goals: Acknowledge presence or absence of depression, maximize coping skills, provide positive support system. Participant is able to verbalize types and ability to use techniques and skills needed for reducing stress and  depression.  Initial Review & Psychosocial Screening:     Initial Psych Review & Screening - 06/29/15 [place]? Yes   Barriers   Psychosocial barriers to participate in program There are no identifiable barriers or psychosocial needs.   Screening Interventions  Interventions Encouraged to exercise      Quality of Life Scores:     Quality of Life - 06/29/15 1339    Quality of Life Scores   Health/Function Pre 21.33 %   Socioeconomic Pre 22.5 %   Psych/Spiritual Pre 21.71 %   Family Pre 22.5 %   GLOBAL Pre 21.82 %      PHQ-9:     Recent Review Flowsheet Data    Depression screen [facility] 2/9 06/29/2015 12/01/2014   Decreased Interest 0 0   Down, Depressed, Hopeless 0 0   PHQ - 2 Score 0 0   Altered sleeping 0 -   Tired, decreased energy 1 -   Change in appetite 1 -   Feeling bad or failure about yourself  0 -   Trouble concentrating 0 -   Moving slowly or fidgety/restless 0 -   Suicidal thoughts 0 -   PHQ-9 Score 2 -   Difficult doing work/chores Not difficult at all -      Psychosocial Evaluation and Intervention:   Psychosocial Re-Evaluation:   Vocational Rehabilitation: Provide vocational rehab assistance to qualifying candidates.   Vocational Rehab Evaluation & Intervention:     Vocational Rehab - 06/29/15 1242    Initial Vocational Rehab Evaluation & Intervention   Assessment shows need for Vocational Rehabilitation No      Education: Education Goals: Education classes will be provided on a weekly basis, covering required topics. Participant will state understanding/return demonstration of topics presented.  Learning Barriers/Preferences:     Learning Barriers/Preferences - 06/29/15 1242    Learning Barriers/Preferences   Learning Barriers None   Learning Preferences None      Education Topics: General Nutrition Guidelines/Fats and Fiber: -Group instruction provided by verbal, written material, models  and posters to present the general guidelines for heart healthy nutrition. Gives an explanation and review of dietary fats and fiber.   Controlling Sodium/Reading Food Labels: -Group verbal and written material supporting the discussion of sodium use in heart healthy nutrition. Review and explanation with models, verbal and written materials for utilization of the food label.   Exercise Physiology & Risk Factors: - Group verbal and written instruction with models to review the exercise physiology of the cardiovascular system and associated critical values. Details cardiovascular disease risk factors and the goals associated with each risk factor.   Aerobic Exercise & Resistance Training: - Gives group verbal and written discussion on the health impact of inactivity. On the components of aerobic and resistive training programs and the benefits of this training and how to safely progress through these programs.   Flexibility, Balance, General Exercise Guidelines: - Provides group verbal and written instruction on the benefits of flexibility and balance training programs. Provides general exercise guidelines with specific guidelines to those with heart or lung disease. Demonstration and skill practice provided.   Stress Management: - Provides group verbal and written instruction about the health risks of elevated stress, cause of high stress, and healthy ways to reduce stress.   Depression: - Provides group verbal and written instruction on the correlation between heart/lung disease and depressed mood, treatment options, and the stigmas associated with seeking treatment.   Anatomy & Physiology of the Heart: - Group verbal and written instruction and models provide basic cardiac anatomy and physiology, with the coronary electrical and arterial systems. Review of: AMI, Angina, Valve disease, Heart Failure, Cardiac Arrhythmia, Pacemakers, and the ICD.   Cardiac Procedures: - Group verbal  and written instruction and models  to describe the testing methods done to diagnose heart disease. Reviews the outcomes of the test results. Describes the treatment choices: Medical Management, Angioplasty, or Coronary Bypass Surgery.   Cardiac Medications: - Group verbal and written instruction to review commonly prescribed medications for heart disease. Reviews the medication, class of the drug, and side effects. Includes the steps to properly store meds and maintain the prescription regimen.   Go Sex-Intimacy & Heart Disease, Get SMART - Goal Setting: - Group verbal and written instruction through game format to discuss heart disease and the return to sexual intimacy. Provides group verbal and written material to discuss and apply goal setting through the application of the S.M.A.R.T. Method.   Other Matters of the Heart: - Provides group verbal, written materials and models to describe Heart Failure, Angina, Valve Disease, and Diabetes in the realm of heart disease. Includes description of the disease process and treatment options available to the cardiac patient.   Exercise & Equipment Safety: - Individual verbal instruction and demonstration of equipment use and safety with use of the equipment.          Cardiac Rehab from 06/29/2015 in [facility] Cardiac and Pulmonary Rehab   Date  06/29/15   Educator  C. EnterkinRN   Instruction Review Code  1- partially meets, needs review/practice      Infection Prevention: - Provides verbal and written material to individual with discussion of infection control including proper hand washing and proper equipment cleaning during exercise session.      Cardiac Rehab from 06/29/2015 in [facility] Cardiac and Pulmonary Rehab   Date  06/29/15   Educator  C. EnterkinRN   Instruction Review Code  2- meets goals/outcomes      Falls Prevention: - Provides verbal and written material to individual with discussion of falls prevention and safety.      Cardiac  Rehab from 06/29/2015 in [facility] Cardiac and Pulmonary Rehab   Date  06/29/15   Educator  C. [place]   Instruction Review Code  2- meets goals/outcomes      Diabetes: - Individual verbal and written instruction to review signs/symptoms of diabetes, desired ranges of glucose level fasting, after meals and with exercise. Advice that pre and post exercise glucose checks will be done for 3 sessions at entry of program.      Cardiac Rehab from 06/29/2015 in [facility] Cardiac and Pulmonary Rehab   Date  06/29/15   Educator  C. EnterkinRN   Instruction Review Code  2- meets goals/outcomes       Knowledge Questionnaire Score:     Knowledge Questionnaire Score - 06/29/15 1343    Knowledge Questionnaire Score   Pre Score 25      Personal Goals and Risk Factors at Admission:     Personal Goals and Risk Factors at Admission - 06/29/15 1347    Core Components/Risk Factors/Patient Goals on Admission    Weight Management Yes   Intervention Weight Management: Develop a combined nutrition and exercise program designed to reach desired caloric intake, while maintaining appropriate intake of nutrient and fiber, sodium and fats, and appropriate energy expenditure required for the weight goal.;Weight Management/Obesity: Establish reasonable short term and long term weight goals.;Obesity: Provide education and appropriate resources to help participant work on and attain dietary goals.   Admit Weight 209 lb 3.2 oz (94.892 kg)   Goal Weight: Short Term 205 lb (92.987 kg)   Goal Weight: Long Term 190 lb (86.183 kg)   Diabetes Yes   Intervention  Provide education about signs/symptoms and action to take for hypo/hyperglycemia.;Provide education about proper nutrition, including hydration, and aerobic/resistive exercise prescription along with prescribed medications to achieve blood glucose in normal ranges: Fasting glucose 65-99 mg/dL   Expected Outcomes Short Term: Participant verbalizes understanding of the  signs/symptoms and immediate care of hyper/hypoglycemia, proper foot care and importance of medication, aerobic/resistive exercise and nutrition plan for blood glucose control.;Long Term: Attainment of HbA1C < 7%.      Personal Goals and Risk Factors Review:    Personal Goals Discharge (Final Personal Goals and Risk Factors Review):    ITP Comments:   Comments: Orientation appt done today. "[name]" said he has had diabetes for 7-8 years.

## 2015-06-29 NOTE — Patient Instructions (Signed)
Patient Instructions  Patient Details  Name: [name] MRN: [id number] Date of Birth: [birth year] Referring Provider:  Teodoro Spray, MD  Below are the personal goals you chose as well as exercise and nutrition goals. Our goal is to help you keep on track towards obtaining and maintaining your goals. We will be discussing your progress on these goals with you throughout the program.  Initial Exercise Prescription:     Initial Exercise Prescription - 06/29/15 1300    Date of Initial Exercise Prescription   Date 06/29/15   Treadmill   MPH 3   Grade 0   Minutes 10   Recumbant Bike   Level 3   RPM 40   Watts 20   NuStep   Level 2   Watts 40   Minutes 10   Arm Ergometer   Level 1   Watts 10   Minutes 10   Recumbant Elliptical   Level 2   RPM 40   Watts 20   Minutes 10   REL-XR   Level 3   Watts 50   Minutes 10   T5 Nustep   Level 1   Watts 20   Minutes 10   Biostep-RELP   Level 2   Watts 40   Minutes 10   Prescription Details   Frequency (times per week) 3   Duration Progress to 45 minutes of aerobic exercise without signs/symptoms of physical distress   Intensity   THRR REST +  30   Ratings of Perceived Exertion 11-15   Perceived Dyspnea 0-4   Progression   Progression Continue progressive overload as per policy without signs/symptoms or physical distress.   Resistance Training   Training Prescription Yes   Weight 2   Reps 10-15      Exercise Goals: Frequency: Be able to perform aerobic exercise three times per week working toward 3-5 days per week.  Intensity: Work with a perceived exertion of 11 (fairly light) - 15 (hard) as tolerated. Follow your new exercise prescription and watch for changes in prescription as you progress with the program. Changes will be reviewed with you when they are made.  Duration: You should be able to do 30 minutes of continuous aerobic exercise in addition to a 5 minute warm-up and a 5 minute cool-down  routine.  Nutrition Goals: Your personal nutrition goals will be established when you do your nutrition analysis with the dietician.  The following are nutrition guidelines to follow: Cholesterol < 200mg /day Sodium < 1500mg /day Fiber: Men over 50 yrs - 30 grams per day  Personal Goals:     Personal Goals and Risk Factors at Admission - 06/29/15 1347    Core Components/Risk Factors/Patient Goals on Admission    Weight Management Yes   Intervention Weight Management: Develop a combined nutrition and exercise program designed to reach desired caloric intake, while maintaining appropriate intake of nutrient and fiber, sodium and fats, and appropriate energy expenditure required for the weight goal.;Weight Management/Obesity: Establish reasonable short term and long term weight goals.;Obesity: Provide education and appropriate resources to help participant work on and attain dietary goals.   Admit Weight 209 lb 3.2 oz (94.892 kg)   Goal Weight: Short Term 205 lb (92.987 kg)   Goal Weight: Long Term 190 lb (86.183 kg)   Diabetes Yes   Intervention Provide education about signs/symptoms and action to take for hypo/hyperglycemia.;Provide education about proper nutrition, including hydration, and aerobic/resistive exercise prescription along with prescribed medications to achieve blood glucose  in normal ranges: Fasting glucose 65-99 mg/dL   Expected Outcomes Short Term: Participant verbalizes understanding of the signs/symptoms and immediate care of hyper/hypoglycemia, proper foot care and importance of medication, aerobic/resistive exercise and nutrition plan for blood glucose control.;Long Term: Attainment of HbA1C < 7%.      Tobacco Use Initial Evaluation: History  Smoking status  . Never Smoker   Smokeless tobacco  . Never Used    Copy of goals given to participant.

## 2015-07-03 DIAGNOSIS — Z951 Presence of aortocoronary bypass graft: Secondary | ICD-10-CM | POA: Diagnosis not present

## 2015-07-03 LAB — GLUCOSE, CAPILLARY
GLUCOSE-CAPILLARY: 103 mg/dL — AB (ref 65–99)
Glucose-Capillary: 119 mg/dL — ABNORMAL HIGH (ref 65–99)

## 2015-07-03 NOTE — Progress Notes (Signed)
Daily Session Note  Patient Details  Name: [name] MRN: [id number] Date of Birth: [birth year] Referring Provider:  Teodoro Spray, MD  Encounter Date: 07/03/2015  Check In:     Session Check In - 07/03/15 1608    Check-In   Location [facility]-Cardiac & Pulmonary Rehab   Staff Present Heath Lark, RN, BSN, CCRP;Carroll Enterkin, RN, [employer], BS, ACSM EP-C, Exercise Physiologist   Supervising physician immediately available to respond to emergencies See telemetry face sheet for immediately available ER MD   Medication changes reported     No   Fall or balance concerns reported    No   Warm-up and Cool-down Performed on first and last piece of equipment   Resistance Training Performed No   VAD Patient? No   Pain Assessment   Currently in Pain? No/denies         Goals Met:  Proper associated with RPD/PD & O2 Sat Exercise tolerated well No report of cardiac concerns or symptoms Strength training completed today  Goals Unmet:  Not Applicable  Comments:    Dr. Emily Filbert is Medical Director for [place] and [facility].

## 2015-07-05 DIAGNOSIS — Z951 Presence of aortocoronary bypass graft: Secondary | ICD-10-CM

## 2015-07-05 LAB — GLUCOSE, CAPILLARY
GLUCOSE-CAPILLARY: 191 mg/dL — AB (ref 65–99)
GLUCOSE-CAPILLARY: 164 mg/dL — AB (ref 65–99)

## 2015-07-05 NOTE — Progress Notes (Signed)
Daily Session Note  Patient Details  Name: DERWOOD BECRAFT MRN: [id number] Date of Birth: [birth year] Referring Provider:  Teodoro Spray, MD  Encounter Date: 07/05/2015  Check In:     Session Check In - 07/05/15 0838    Check-In   Location [facility]-Cardiac & Pulmonary Rehab   Staff Present Heath Lark, RN, BSN, CCRP;Renee Dillard [place], MS, ACSM CEP, Exercise Physiologist;Careen Mauch, BS, ACSM EP-C, Exercise Physiologist   Supervising physician immediately available to respond to emergencies See telemetry face sheet for immediately available ER MD   Medication changes reported     No   Fall or balance concerns reported    No   Warm-up and Cool-down Performed on first and last piece of equipment   Resistance Training Performed No   VAD Patient? No   Pain Assessment   Currently in Pain? No/denies         Goals Met:  Proper associated with RPD/PD & O2 Sat Exercise tolerated well No report of cardiac concerns or symptoms Strength training completed today  Goals Unmet:  Not Applicable  Comments:    Dr. Emily Filbert is Medical Director for [place] and [facility].

## 2015-07-06 DIAGNOSIS — Z951 Presence of aortocoronary bypass graft: Secondary | ICD-10-CM | POA: Diagnosis not present

## 2015-07-06 LAB — GLUCOSE, CAPILLARY: Glucose-Capillary: 190 mg/dL — ABNORMAL HIGH (ref 65–99)

## 2015-07-06 NOTE — Progress Notes (Signed)
Daily Session Note  Patient Details  Name: [name] MRN: [id number] Date of Birth: December 20, 1945 Referring Provider:  Teodoro Spray, MD  Encounter Date: 07/06/2015  Check In:     Session Check In - 07/06/15 1611    Check-In   Location [facility]-Cardiac & Pulmonary Rehab   Staff Present Gerlene Burdock, RN, BSN;Pharaoh Pio, BS, ACSM EP-C, Exercise Physiologist;Diane Joya Gaskins, RN, BSN   Supervising physician immediately available to respond to emergencies See telemetry face sheet for immediately available ER MD   Medication changes reported     No   Fall or balance concerns reported    No   Warm-up and Cool-down Performed on first and last piece of equipment   Resistance Training Performed No   VAD Patient? No   Pain Assessment   Currently in Pain? No/denies         Goals Met:  Proper associated with RPD/PD & O2 Sat Exercise tolerated well No report of cardiac concerns or symptoms Strength training completed today  Goals Unmet:  Not Applicable  Comments:   Dr. Emily Filbert is Medical Director for [place] and [facility].

## 2015-07-10 ENCOUNTER — Encounter: Payer: Medicare Other | Attending: Cardiology

## 2015-07-10 DIAGNOSIS — Z951 Presence of aortocoronary bypass graft: Secondary | ICD-10-CM | POA: Insufficient documentation

## 2015-07-10 NOTE — Progress Notes (Signed)
Daily Session Note  Patient Details  Name: [name] MRN: [id number] Date of Birth: [birth year] Referring Provider:  Teodoro Spray, MD  Encounter Date: 07/10/2015  Check In:     Session Check In - 07/10/15 1608    Check-In   Location [facility]-Cardiac & Pulmonary Rehab   Staff Present Nyoka Cowden, RN;Carroll Enterkin, RN, BSN;Kandie Keiper, BS, ACSM EP-C, Exercise Physiologist   Supervising physician immediately available to respond to emergencies See telemetry face sheet for immediately available ER MD   Medication changes reported     No   Fall or balance concerns reported    No   Warm-up and Cool-down Performed on first and last piece of equipment   Resistance Training Performed No   VAD Patient? No   Pain Assessment   Currently in Pain? No/denies         Goals Met:  Proper associated with RPD/PD & O2 Sat Exercise tolerated well No report of cardiac concerns or symptoms Strength training completed today  Goals Unmet:  Not Applicable  Comments:    Dr. Emily Filbert is Medical Director for [place] and [facility].

## 2015-07-13 DIAGNOSIS — Z951 Presence of aortocoronary bypass graft: Secondary | ICD-10-CM

## 2015-07-13 NOTE — Progress Notes (Signed)
Daily Session Note  Patient Details  Name: [name] MRN: [id number] Date of Birth: Nov 18, 1945 Referring Provider:  Teodoro Spray, MD  Encounter Date: 07/13/2015  Check In:     Session Check In - 07/13/15 1606    Check-In   Location [facility]-Cardiac & Pulmonary Rehab   Staff Present Gerlene Burdock, RN, BSN;Rochele Lueck, BS, ACSM EP-C, Exercise Physiologist;Diane Joya Gaskins, RN, BSN   Supervising physician immediately available to respond to emergencies See telemetry face sheet for immediately available ER MD   Medication changes reported     No   Fall or balance concerns reported    No   Warm-up and Cool-down Performed on first and last piece of equipment   Resistance Training Performed No   VAD Patient? No   Pain Assessment   Currently in Pain? No/denies         Goals Met:  Proper associated with RPD/PD & O2 Sat Exercise tolerated well No report of cardiac concerns or symptoms Strength training completed today  Goals Unmet:  Not Applicable  Comments:    Dr. Emily Filbert is Medical Director for [place] and [facility].

## 2015-07-17 ENCOUNTER — Encounter: Payer: Medicare Other | Admitting: *Deleted

## 2015-07-17 DIAGNOSIS — Z951 Presence of aortocoronary bypass graft: Secondary | ICD-10-CM | POA: Diagnosis not present

## 2015-07-17 NOTE — Progress Notes (Signed)
Daily Session Note  Patient Details  Name: [name] MRN: [id number] Date of Birth: 1945/12/14 Referring Provider:  Teodoro Spray, MD  Encounter Date: 07/17/2015  Check In:     Session Check In - 07/17/15 1611    Check-In   Location [facility]   Staff Present Jeanell Sparrow, DPT, Ronaldo Miyamoto, BS, ACSM CEP, Exercise Physiologist;Mary Kellie Shropshire, RN   Supervising physician immediately available to respond to emergencies See telemetry face sheet for immediately available ER MD   Medication changes reported     No   Fall or balance concerns reported    No   Warm-up and Cool-down Performed on first and last piece of equipment   Resistance Training Performed Yes   VAD Patient? No   Pain Assessment   Currently in Pain? No/denies   Multiple Pain Sites No           Exercise Prescription Changes - 07/17/15 1600    Exercise Review   Progression Yes   Resistance Training   Training Prescription Yes   Weight 2   Reps 10-15   Treadmill   MPH 3   Grade 2   Minutes 12   Recumbant Bike   Level 5   RPM 40   Watts 20   NuStep   Level 2   Watts 40   Minutes 10   Arm Ergometer   Level 1   Watts 10   Minutes 10   Recumbant Elliptical   Level 2   RPM 40   Watts 20   Minutes 10   REL-XR   Level 3   Watts 50   Minutes 10   T5 Nustep   Level 1   Watts 20   Minutes 10   Biostep-RELP   Level 2   Watts 40   Minutes 10      Goals Met:  Independence with exercise equipment Exercise tolerated well Personal goals reviewed No report of cardiac concerns or symptoms Strength training completed today  Goals Unmet:  Not Applicable  Comments: Reviewed individualized exercise prescription and made increases per departmental policy. Exercise increases were discussed with the patient and they were able to perform the new work loads without issue (no signs or symptoms).     Dr. Emily Filbert is Medical Director for [facility] [facility].

## 2015-07-20 ENCOUNTER — Encounter: Payer: Medicare Other | Admitting: *Deleted

## 2015-07-20 DIAGNOSIS — Z951 Presence of aortocoronary bypass graft: Secondary | ICD-10-CM

## 2015-07-20 NOTE — Progress Notes (Signed)
Daily Session Note  Patient Details  Name: [name] MRN: [id number] Date of Birth: 1946/03/28 Referring Provider:    Encounter Date: 07/20/2015  Check In:     Session Check In - 07/20/15 1624    Check-In   Location [facility]   Staff Present Nyoka Cowden, RN;Diane Joya Gaskins, RN, BSN   Supervising physician immediately available to respond to emergencies See telemetry face sheet for immediately available ER MD   Medication changes reported     No   Fall or balance concerns reported    No   Warm-up and Cool-down Performed on first and last piece of equipment   Resistance Training Performed Yes   VAD Patient? No   Pain Assessment   Currently in Pain? No/denies         Goals Met:  Proper associated with RPD/PD & O2 Sat Exercise tolerated well No report of cardiac concerns or symptoms Strength training completed today  Goals Unmet:  Not Applicable  Comments:  Patient completed exercise prescription and all exercise goals during rehab session. The exercise was tolerated well and the patient is progressing in the program   Dr. Emily Filbert is Medical Director for [facility].

## 2015-07-23 ENCOUNTER — Other Ambulatory Visit: Payer: Self-pay | Admitting: Family Medicine

## 2015-07-26 ENCOUNTER — Encounter: Payer: Medicare Other | Admitting: *Deleted

## 2015-07-26 DIAGNOSIS — Z951 Presence of aortocoronary bypass graft: Secondary | ICD-10-CM | POA: Diagnosis not present

## 2015-07-26 NOTE — Progress Notes (Signed)
Daily Session Note  Patient Details  Name: [name] MRN: [id number] Date of Birth: [birth year] Referring Provider:    Encounter Date: 07/26/2015  Check In:     Session Check In - 07/26/15 1613    Check-In   Location [facility]   Staff Present Nyoka Cowden, RN;Tekeya Geffert, RN, Alex Gardener, DPT, CEEA   Supervising physician immediately available to respond to emergencies See telemetry face sheet for immediately available ER MD   Medication changes reported     No   Warm-up and Cool-down Performed on first and last piece of equipment   VAD Patient? No   VAD patient   Has back up controller? No         Goals Met:  Proper associated with RPD/PD & O2 Sat Exercise tolerated well  Goals Unmet:  Not Applicable  Comments:    Dr. Emily Filbert is Medical Director for [place] [facility].

## 2015-07-27 ENCOUNTER — Encounter: Payer: Medicare Other | Admitting: *Deleted

## 2015-07-27 DIAGNOSIS — Z951 Presence of aortocoronary bypass graft: Secondary | ICD-10-CM | POA: Diagnosis not present

## 2015-07-27 NOTE — Progress Notes (Signed)
Daily Session Note  Patient Details  Name: ANTHONEY SHEPPARD MRN: [id number] Date of Birth: 12-Jul-1945 Referring Provider:    Encounter Date: 07/27/2015  Check In:     Session Check In - 07/27/15 1713    Check-In   Staff Present Gerlene Burdock, RN, BSN;Rebecca Sickles, DPT, CEEA;Heath Lark, RN, BSN, CCRP   Supervising physician immediately available to respond to emergencies See telemetry face sheet for immediately available ER MD   Medication changes reported     No   Fall or balance concerns reported    No   Warm-up and Cool-down Performed on first and last piece of equipment   Resistance Training Performed Yes   VAD Patient? No   Pain Assessment   Currently in Pain? No/denies         Goals Met:  Independence with exercise equipment Exercise tolerated well No report of cardiac concerns or symptoms Strength training completed today  Goals Unmet:  Not Applicable  Comments: Doing well with exercise prescription progression.    Dr. Emily Filbert is Medical Director for [place] [facility].

## 2015-07-30 ENCOUNTER — Encounter: Payer: Self-pay | Admitting: *Deleted

## 2015-07-30 DIAGNOSIS — Z951 Presence of aortocoronary bypass graft: Secondary | ICD-10-CM

## 2015-07-30 NOTE — Progress Notes (Signed)
Cardiac Individual Treatment Plan  Patient Details  Name: [name] MRN: [id number] Date of Birth: 04/05/1946 Referring Provider:    Initial Encounter Date:       Cardiac Rehab from 06/29/2015 in [facility]   Date  06/29/15      Visit Diagnosis: S/P CABG x 4  Patient's Home Medications on Admission:  Current outpatient prescriptions:  .  amiodarone (PACERONE) 200 MG tablet, Take 1 tablet (200 mg total) by mouth 2 (two) times daily. For 10 Days, then decrease to 200 mg daily, Disp: 60 tablet, Rfl: 3 .  aspirin 81 MG tablet, Take 81 mg by mouth daily., Disp: , Rfl:  .  atorvastatin (LIPITOR) 40 MG tablet, Take 1 tablet (40 mg total) by mouth daily., Disp: 30 tablet, Rfl: 6 .  folic acid (FOLVITE) 1 MG tablet, Take 1 tablet (1 mg total) by mouth daily. (Patient not taking: Reported on 06/29/2015), Disp: 30 tablet, Rfl: 0 .  glipiZIDE (GLUCOTROL) 5 MG tablet, Take 1 tablet (5 mg total) by mouth 2 (two) times daily., Disp: 180 tablet, Rfl: 2 .  losartan (COZAAR) 50 MG tablet, Take by mouth., Disp: , Rfl:  .  metFORMIN (GLUCOPHAGE) 500 MG tablet, Take 500 mg by mouth 2 (two) times daily with a meal., Disp: , Rfl:  .  metoprolol tartrate (LOPRESSOR) 25 MG tablet, Take 0.5 tablets (12.5 mg total) by mouth 2 (two) times daily., Disp: 60 tablet, Rfl: 6 .  VIAGRA 50 MG tablet, TAKE 1 TABLET BY MOUTH AS NEEDED, Disp: 7 tablet, Rfl: 0  Past Medical History: Past Medical History  Diagnosis Date  . Hypertension   . Coronary artery disease   . Hyperlipidemia   . Type II diabetes mellitus (HCC)     Tobacco Use: History  Smoking status  . Never Smoker   Smokeless tobacco  . Never Used    Labs: Recent Review Flowsheet Data    Labs for ITP Cardiac and Pulmonary Rehab Latest Ref Rng 04/21/2015 04/21/2015 04/21/2015 04/21/2015 04/22/2015   PHART 7.350 - 7.450 7.392 7.337(L) 7.286(L) - -   PCO2ART 35.0 - 45.0 mmHg 38.5 42.5 46.2(H) - -   HCO3 20.0 - 24.0 mEq/L 23.5  22.8 22.1 - -   TCO2 0 - 100 mmol/L 25 24 23 22 23   ACIDBASEDEF 0.0 - 2.0 mmol/L 1.0 3.0(H) 5.0(H) - -   O2SAT - 94.0 97.0 98.0 - -       Exercise Target Goals:    Exercise Program Goal: Individual exercise prescription set with THRR, safety & activity barriers. Participant demonstrates ability to understand and report RPE using BORG scale, to self-measure pulse accurately, and to acknowledge the importance of the exercise prescription.  Exercise Prescription Goal: Starting with aerobic activity 30 plus minutes a day, 3 days per week for initial exercise prescription. Provide home exercise prescription and guidelines that participant acknowledges understanding prior to discharge.  Activity Barriers & Risk Stratification:     Activity Barriers & Cardiac Risk Stratification - 06/29/15 1242    Activity Barriers & Cardiac Risk Stratification   Activity Barriers None   Cardiac Risk Stratification High      6 Minute Walk:     6 Minute Walk      06/29/15 1301       6 Minute Walk   Phase Initial     Distance 1495 feet     Walk Time 6 minutes     # of Rest Breaks 0       RPE 11     Symptoms No     Resting HR 60 bpm     Resting BP 132/80 mmHg     Max Ex. HR 90 bpm     Max Ex. BP 128/74 mmHg        Initial Exercise Prescription:     Initial Exercise Prescription - 06/29/15 1300    Date of Initial Exercise RX and Referring Provider   Date 06/29/15   Treadmill   MPH 3   Grade 0   Minutes 10   Recumbant Bike   Level 3   RPM 40   Watts 20   NuStep   Level 2   Watts 40   Minutes 10   Arm Ergometer   Level 1   Watts 10   Minutes 10   Recumbant Elliptical   Level 2   RPM 40   Watts 20   Minutes 10   REL-XR   Level 3   Watts 50   Minutes 10   T5 Nustep   Level 1   Watts 20   Minutes 10   Biostep-RELP   Level 2   Watts 40   Minutes 10   Prescription Details   Frequency (times per week) 3   Duration Progress to 45 minutes of aerobic exercise without  signs/symptoms of physical distress   Intensity   THRR REST +  30   Ratings of Perceived Exertion 11-15   Perceived Dyspnea 0-4   Progression   Progression Continue progressive overload as per policy without signs/symptoms or physical distress.   Resistance Training   Training Prescription Yes   Weight 2   Reps 10-15      Perform Capillary Blood Glucose checks as needed.  Exercise Prescription Changes:     Exercise Prescription Changes      07/17/15 1600 07/20/15 1600 07/26/15 1600 07/27/15 1452     Exercise Review   Progression Yes Yes Yes Yes    Response to Exercise   Blood Pressure (Admit)    154/76 mmHg    Blood Pressure (Exercise)    150/62 mmHg    Blood Pressure (Exit)    144/62 mmHg    Heart Rate (Admit)    63 bpm    Heart Rate (Exercise)    109 bpm    Heart Rate (Exit)    61 bpm    Rating of Perceived Exertion (Exercise)    14    Symptoms    No    Duration    Progress to 50 minutes of aerobic without signs/symptoms of physical distress    Intensity    Rest + 30    Resistance Training   Training Prescription Yes Yes Yes Yes    Weight _0 Reps 10-15 10-15 10-15 10-15    Interval Training   Interval Training    Yes    Equipment    Recumbant Bike    Comments    --  Level 7-Level 8    Treadmill   MPH _1 Grade _2 Minutes _3 Recumbant Bike   Level _4 RPM 40 40 40     Watts _5 95    NuStep   Level _6 Watts 40 40 40     Minutes 10 10 10  Arm Ergometer   Level _0 Watts _1 Minutes _2 Recumbant Elliptical   Level _3 RPM 40 40 40     Watts _4 Minutes _5 REL-XR   Level _6 Watts 50 50 50     Minutes _7 T5 Nustep   Level _8 Watts _9 Minutes _10 Biostep-RELP   Level _11 Watts 40 40 40     Minutes _12 Exercise Comments:     Exercise Comments      07/17/15 1626 07/28/15  1457         Exercise Comments Reviewed individualized exercise prescription and made increases per departmental policy. Exercise increases were discussed with the patient and they were able to perform the new work loads without issue (no signs or symptoms).  [name] has been attending Heart Track regularly and making progress in tolerance to exercise in both duration and intensity.  He is able to exercise successfully without complications.  Goal will be to continue to progress in Heart Track per program policy         Discharge Exercise Prescription (Final Exercise Prescription Changes):     Exercise Prescription Changes - 07/27/15 1452    Exercise Review   Progression Yes   Response to Exercise   Blood Pressure (Admit) 154/76 mmHg   Blood Pressure (Exercise) 150/62 mmHg   Blood Pressure (Exit) 144/62 mmHg   Heart Rate (Admit) 63 bpm   Heart Rate (Exercise) 109 bpm   Heart Rate (Exit) 61 bpm   Rating of Perceived Exertion (Exercise) 14   Symptoms No   Duration Progress to 50 minutes of aerobic without signs/symptoms of physical distress   Intensity Rest + 30   Resistance Training   Training Prescription Yes   Weight 3   Reps 10-15   Interval Training   Interval Training Yes   Equipment Recumbant Bike   Comments --  Level 7-Level 8   Treadmill   MPH 3   Grade 4   Minutes 15   Recumbant Bike   Level 8   Watts 95      Nutrition:  Target Goals: Understanding of nutrition guidelines, daily intake of sodium <1542m, cholesterol <2044m calories 30% from fat and 7% or less from saturated fats, daily to have 5 or more servings of fruits and vegetables.  Biometrics:     Pre Biometrics - 06/29/15 1302    Pre Biometrics   Height 5' 10.1" (1.781 m)   Weight 209 lb 3.2 oz (94.892 kg)   Waist Circumference 42 inches   Hip Circumference 41 inches   Waist to Hip Ratio 1.02 %   BMI (Calculated) 30       Nutrition Therapy Plan and Nutrition Goals:     Nutrition Therapy &  Goals - 07/06/15 1740    Nutrition Therapy   Diet 1900kcal Diabetic with DASH guidelines   Drug/Food Interactions Statins/Certain Fruits   Protein (specify units) 8oz   Fiber 30 grams   Whole Grain Foods 3 servings   Saturated Fats 14 max. grams  Fruits and Vegetables 5 servings/day   Sodium 1500 grams   Personal Nutrition Goals   Personal Goal #1 Continue with current healthy food choices.   Comments Patient reports having worked on food portions since his surgery. He feels he is following a heart healthy pattern.    Intervention Plan   Intervention Nutrition handout(s) given to patient.;Prescribe, educate and counsel regarding individualized specific dietary modifications aiming towards targeted core components such as weight, hypertension, lipid management, diabetes, heart failure and other comorbidities.   Expected Outcomes Short Term Goal: A plan has been developed with personal nutrition goals set during dietitian appointment.      Nutrition Discharge: Rate Your Plate Scores:     Nutrition Assessments - 07/06/15 1743    Rate Your Plate Scores   Pre Score 62   Pre Score % 69 %      Nutrition Goals Re-Evaluation:   Psychosocial: Target Goals: Acknowledge presence or absence of depression, maximize coping skills, provide positive support system. Participant is able to verbalize types and ability to use techniques and skills needed for reducing stress and depression.  Initial Review & Psychosocial Screening:     Initial Psych Review & Screening - 06/29/15 1340    Family Dynamics   Good Support System? Yes   Barriers   Psychosocial barriers to participate in program There are no identifiable barriers or psychosocial needs.   Screening Interventions   Interventions Encouraged to exercise      Quality of Life Scores:     Quality of Life - 06/29/15 1339    Quality of Life Scores   Health/Function Pre 21.33 %   Socioeconomic Pre 22.5 %   Psych/Spiritual Pre 21.71  %   Family Pre 22.5 %   GLOBAL Pre 21.82 %      PHQ-9:     Recent Review Flowsheet Data    Depression screen PHQ 2/9 06/29/2015 12/01/2014   Decreased Interest 0 0   Down, Depressed, Hopeless 0 0   PHQ - 2 Score 0 0   Altered sleeping 0 -   Tired, decreased energy 1 -   Change in appetite 1 -   Feeling bad or failure about yourself  0 -   Trouble concentrating 0 -   Moving slowly or fidgety/restless 0 -   Suicidal thoughts 0 -   PHQ-9 Score 2 -   Difficult doing work/chores Not difficult at all -      Psychosocial Evaluation and Intervention:     Psychosocial Evaluation - 07/03/15 1701    Psychosocial Evaluation & Interventions   Interventions Encouraged to exercise with the program and follow exercise prescription   Comments counselor met with Mr. Gaskill for initial psychosocial evaluation.  He is a 69 year old retired gentleman who had quadruple bypass surgery on 04/21/15.  He has a strong support system with a spouse of 33 years and an adult son who lives in the home.  Mr. Gassett has diabetes also, but states he sleeps well and has a good appetite.  He denies a history of depression or anxiety or any current symptoms.  Mr. Bolduc states he is typically in a positive mood and has minimal stress in his life.  He has goals for increasing his stamina so he can return to his normal activities such as participating in a hiking group and bike riding.  Counselor encouraged him to exercise consistently since those are outdoor activities and can be subject to the weather.  Mr. Flemister reports   also being a member of the [employer] and participating in [employer] if needed.        Psychosocial Re-Evaluation:   Vocational Rehabilitation: Provide vocational rehab assistance to qualifying candidates.   Vocational Rehab Evaluation & Intervention:     Vocational Rehab - 06/29/15 1242    Initial Vocational Rehab Evaluation & Intervention   Assessment shows need for Vocational  Rehabilitation No      Education: Education Goals: Education classes will be provided on a weekly basis, covering required topics. Participant will state understanding/return demonstration of topics presented.  Learning Barriers/Preferences:     Learning Barriers/Preferences - 06/29/15 1242    Learning Barriers/Preferences   Learning Barriers None   Learning Preferences None      Education Topics: General Nutrition Guidelines/Fats and Fiber: -Group instruction provided by verbal, written material, models and posters to present the general guidelines for heart healthy nutrition. Gives an explanation and review of dietary fats and fiber.          Cardiac Rehab from 07/26/2015 in [facility]   Date  07/10/15   Educator  PI   Instruction Review Code  2- meets goals/outcomes      Controlling Sodium/Reading Food Labels: -Group verbal and written material supporting the discussion of sodium use in heart healthy nutrition. Review and explanation with models, verbal and written materials for utilization of the food label.      Cardiac Rehab from 07/26/2015 in [facility]   Date  07/17/15   Educator  PI   Instruction Review Code  2- meets goals/outcomes      Exercise Physiology & Risk Factors: - Group verbal and written instruction with models to review the exercise physiology of the cardiovascular system and associated critical values. Details cardiovascular disease risk factors and the goals associated with each risk factor.      Cardiac Rehab from 07/26/2015 in [facility]   Date  07/26/15   Educator  Becky Sickles, PT   Instruction Review Code  2- meets goals/outcomes      Aerobic Exercise & Resistance Training: - Gives group verbal and written discussion on the health impact of inactivity. On the components of aerobic and resistive training programs and the benefits of this training and how to safely progress  through these programs.   Flexibility, Balance, General Exercise Guidelines: - Provides group verbal and written instruction on the benefits of flexibility and balance training programs. Provides general exercise guidelines with specific guidelines to those with heart or lung disease. Demonstration and skill practice provided.   Stress Management: - Provides group verbal and written instruction about the health risks of elevated stress, cause of high stress, and healthy ways to reduce stress.   Depression: - Provides group verbal and written instruction on the correlation between heart/lung disease and depressed mood, treatment options, and the stigmas associated with seeking treatment.   Anatomy & Physiology of the Heart: - Group verbal and written instruction and models provide basic cardiac anatomy and physiology, with the coronary electrical and arterial systems. Review of: AMI, Angina, Valve disease, Heart Failure, Cardiac Arrhythmia, Pacemakers, and the ICD.   Cardiac Procedures: - Group verbal and written instruction and models to describe the testing methods done to diagnose heart disease. Reviews the outcomes of the test results. Describes the treatment choices: Medical Management, Angioplasty, or Coronary Bypass Surgery.   Cardiac Medications: - Group verbal and written instruction to review commonly prescribed medications for   heart disease. Reviews the medication, class of the drug, and side effects. Includes the steps to properly store meds and maintain the prescription regimen.   Go Sex-Intimacy & Heart Disease, Get SMART - Goal Setting: - Group verbal and written instruction through game format to discuss heart disease and the return to sexual intimacy. Provides group verbal and written material to discuss and apply goal setting through the application of the S.M.A.R.T. Method.   Other Matters of the Heart: - Provides group verbal, written materials and models to  describe Heart Failure, Angina, Valve Disease, and Diabetes in the realm of heart disease. Includes description of the disease process and treatment options available to the cardiac patient.   Exercise & Equipment Safety: - Individual verbal instruction and demonstration of equipment use and safety with use of the equipment.      Cardiac Rehab from 07/26/2015 in [facility]   Date  06/29/15   Educator  C. EnterkinRN   Instruction Review Code  1- partially meets, needs review/practice      Infection Prevention: - Provides verbal and written material to individual with discussion of infection control including proper hand washing and proper equipment cleaning during exercise session.      Cardiac Rehab from 07/26/2015 in [facility]   Date  06/29/15   Educator  C. EnterkinRN   Instruction Review Code  2- meets goals/outcomes      Falls Prevention: - Provides verbal and written material to individual with discussion of falls prevention and safety.      Cardiac Rehab from 07/26/2015 in [facility] Re[facility]   Date  06/29/15   Educator  C. [place]   Instruction Review Code  2- meets goals/outcomes      Diabetes: - Individual verbal and written instruction to review signs/symptoms of diabetes, desired ranges of glucose level fasting, after meals and with exercise. Advice that pre and post exercise glucose checks will be done for 3 sessions at entry of program.      Cardiac Rehab from 07/26/2015 in [facility] Cardiac and Pulmonary Rehab   Date  06/29/15   Educator  C. EnterkinRN   Instruction Review Code  2- meets goals/outcomes       Knowledge Questionnaire Score:     Knowledge Questionnaire Score - 06/29/15 1343    Knowledge Questionnaire Score   Pre Score 25      Core Components/Risk Factors/Patient Goals at Admission:     Personal Goals and Risk Factors at Admission - 06/29/15 1347    Core Components/Risk Factors/Patient  Goals on Admission    Weight Management Yes   Intervention Weight Management: Develop a combined nutrition and exercise program designed to reach desired caloric intake, while maintaining appropriate intake of nutrient and fiber, sodium and fats, and appropriate energy expenditure required for the weight goal.;Weight Management/Obesity: Establish reasonable short term and long term weight goals.;Obesity: Provide education and appropriate resources to help participant work on and attain dietary goals.   Admit Weight 209 lb 3.2 oz (94.892 kg)   Goal Weight: Short Term 205 lb (92.987 kg)   Goal Weight: Long Term 190 lb (86.183 kg)   Diabetes Yes   Intervention Provide education about signs/symptoms and action to take for hypo/hyperglycemia.;Provide education about proper nutrition, including hydration, and aerobic/resistive exercise prescription along with prescribed medications to achieve blood glucose in normal ranges: Fasting glucose 65-99 mg/dL   Expected Outcomes Short Term: Participant verbalizes understanding of the signs/symptoms and  immediate care of hyper/hypoglycemia, proper foot care and importance of medication, aerobic/resistive exercise and nutrition plan for blood glucose control.;Long Term: Attainment of HbA1C < 7%.      Core Components/Risk Factors/Patient Goals Review:    Core Components/Risk Factors/Patient Goals at Discharge (Final Review):    ITP Comments:     ITP Comments      06/29/15 1351 06/29/15 1548 07/30/15 1045       ITP Comments " Tim" Joevon keeps very detailed records of his blood sugars which fasting have been 90s or high 80s. He checks his blood pressure every day and it has been up a little 140s so MD started him on new medicine. See med list.  30 Day Review. Continue with the ITP.  New to program 30 Day review. Continue with ITP        Comments:   

## 2015-07-31 ENCOUNTER — Encounter: Payer: Medicare Other | Admitting: *Deleted

## 2015-07-31 DIAGNOSIS — Z951 Presence of aortocoronary bypass graft: Secondary | ICD-10-CM | POA: Diagnosis not present

## 2015-07-31 NOTE — Progress Notes (Signed)
Daily Session Note  Patient Details  Name: Cleon T Renner MRN: [id number] Date of Birth: 12/02/1945 Referring Provider:    Encounter Date: 07/31/2015  Check In:     Session Check In - 07/31/15 1610    Check-In   Staff Present Susanne Bice, RN, BSN, CCRP;Rebecca Sickles, DPT, CEEA   Supervising physician immediately available to respond to emergencies See telemetry face sheet for immediately available ER MD   Medication changes reported     No   Fall or balance concerns reported    No   Warm-up and Cool-down Performed on first and last piece of equipment   VAD Patient? No   VAD patient   Has back up controller? No   Pain Assessment   Currently in Pain? No/denies         Goals Met:  Exercise tolerated well No report of cardiac concerns or symptoms  Goals Unmet:  Not Applicable  Comments: Doing well with exercise prescription progression.    Dr. Mark Miller is Medical Director for [facility] and [facility]. 

## 2015-08-03 ENCOUNTER — Encounter: Payer: Medicare Other | Admitting: *Deleted

## 2015-08-03 DIAGNOSIS — I10 Essential (primary) hypertension: Secondary | ICD-10-CM | POA: Diagnosis not present

## 2015-08-03 DIAGNOSIS — I2510 Atherosclerotic heart disease of native coronary artery without angina pectoris: Secondary | ICD-10-CM | POA: Diagnosis not present

## 2015-08-03 DIAGNOSIS — E1159 Type 2 diabetes mellitus with other circulatory complications: Secondary | ICD-10-CM | POA: Diagnosis not present

## 2015-08-03 DIAGNOSIS — Z951 Presence of aortocoronary bypass graft: Secondary | ICD-10-CM

## 2015-08-03 DIAGNOSIS — I25810 Atherosclerosis of coronary artery bypass graft(s) without angina pectoris: Secondary | ICD-10-CM | POA: Diagnosis not present

## 2015-08-03 NOTE — Progress Notes (Signed)
Daily Session Note  Patient Details  Name: [name] MRN: [id number] Date of Birth: 1945/04/15 Referring Provider:    Encounter Date: 08/03/2015  Check In:     Session Check In - 08/03/15 1717    Check-In   Location [facility]   Staff Present Nyoka Cowden, RN;Susanne Bice, RN, BSN, CCRP;Diane Joya Gaskins, RN, BSN   Supervising physician immediately available to respond to emergencies See telemetry face sheet for immediately available ER MD   Medication changes reported     No   Fall or balance concerns reported    No   Warm-up and Cool-down Performed on first and last piece of equipment   Resistance Training Performed Yes   VAD Patient? No   VAD patient   Has back up controller? No   Pain Assessment   Currently in Pain? No/denies           Exercise Prescription Changes - 08/03/15 1700    Exercise Review   Progression Yes   Response to Exercise   Duration Progress to 50 minutes of aerobic without signs/symptoms of physical distress   Intensity Rest + 30   Resistance Training   Training Prescription Yes   Weight 4   Reps 10-15   Interval Training   Interval Training Yes   Equipment Recumbant Bike   Comments --  Level 7-Level 8   Treadmill   MPH 3   Grade 4   Minutes 12   Recumbant Bike   Level 8   Watts 95      Goals Met:  Independence with exercise equipment Exercise tolerated well Personal goals reviewed No report of cardiac concerns or symptoms Strength training completed today  Goals Unmet:  Not Applicable  Comments:  Patient completed exercise prescription and all exercise goals during rehab session. The exercise was tolerated well and the patient is progressing in the program.    Dr. Emily Filbert is Medical Director for [place] and [facility].

## 2015-08-04 ENCOUNTER — Other Ambulatory Visit: Payer: Self-pay

## 2015-08-04 ENCOUNTER — Other Ambulatory Visit: Payer: Self-pay | Admitting: Family Medicine

## 2015-08-04 MED ORDER — GLUCOSE BLOOD VI STRP: 1.0000 | ORAL_STRIP | Freq: Every day | Status: DC

## 2015-08-07 ENCOUNTER — Encounter: Payer: Medicare Other | Attending: Cardiology

## 2015-08-07 DIAGNOSIS — Z951 Presence of aortocoronary bypass graft: Secondary | ICD-10-CM

## 2015-08-07 NOTE — Progress Notes (Signed)
Daily Session Note  Patient Details  Name: [name] MRN: [id number] Date of Birth: November 26, 1945 Referring Provider:    Encounter Date: 08/07/2015  Check In:     Session Check In - 08/07/15 1615    Check-In   Location [facility]   Staff Present Heath Lark, RN, BSN, Laveda Norman, BS, ACSM CEP, Exercise Physiologist;Adib Wahba Brayton El, DPT, CEEA   Supervising physician immediately available to respond to emergencies See telemetry face sheet for immediately available ER MD   Medication changes reported     No   Fall or balance concerns reported    No   Warm-up and Cool-down Performed on first and last piece of equipment   Resistance Training Performed Yes   VAD Patient? No         Goals Met:  Exercise tolerated well No report of cardiac concerns or symptoms  Goals Unmet:  Not Applicable  Comments: Patient completed exercise prescription and all exercise goals during rehab session. The exercise was tolerated well and the patient is progressing in the program.   Dr. Emily Filbert is Medical Director for [place] and [facility].

## 2015-08-09 ENCOUNTER — Encounter: Payer: Medicare Other | Admitting: *Deleted

## 2015-08-09 DIAGNOSIS — Z951 Presence of aortocoronary bypass graft: Secondary | ICD-10-CM

## 2015-08-09 NOTE — Progress Notes (Signed)
Daily Session Note  Patient Details  Name: [name] MRN: [id number] Date of Birth: 04/16/1945 Referring Provider:    Encounter Date: 08/09/2015  Check In:     Session Check In - 08/09/15 1611    Check-In   Location [facility]   Staff Present Carroll Enterkin, RN, BSN;Diane Wright, RN, BSN;Rebecca Sickles, DPT, CEEA   Supervising physician immediately available to respond to emergencies See telemetry face sheet for immediately available ER MD   Medication changes reported     No   Fall or balance concerns reported    No   Warm-up and Cool-down Performed on first and last piece of equipment   Resistance Training Performed Yes   VAD Patient? No   VAD patient   Has back up controller? No         Goals Met:  Proper associated with RPD/PD & O2 Sat Exercise tolerated well  Goals Unmet:  Not Applicable  Comments:    Dr. Mark Miller is Medical Director for [facility] and [facility]. 

## 2015-08-10 ENCOUNTER — Encounter: Payer: Medicare Other | Admitting: *Deleted

## 2015-08-10 DIAGNOSIS — Z951 Presence of aortocoronary bypass graft: Secondary | ICD-10-CM

## 2015-08-10 NOTE — Progress Notes (Signed)
Daily Session Note  Patient Details  Name: ABHIRAM CRIADO MRN: [id number] Date of Birth: 1945/09/18 Referring Provider:    Encounter Date: 08/10/2015  Check In:     Session Check In - 08/10/15 1700    Check-In   Location [facility]   Staff Present Heath Lark, RN, BSN, CCRP;Bryannah Boston, RN, Jana Half, RN, BSN   Supervising physician immediately available to respond to emergencies See telemetry face sheet for immediately available ER MD   Medication changes reported     No   Fall or balance concerns reported    No   Warm-up and Cool-down Performed on first and last piece of equipment   Resistance Training Performed Yes   VAD Patient? No   VAD patient   Has back up controller? No   Pain Assessment   Currently in Pain? No/denies         Goals Met:  Proper associated with RPD/PD & O2 Sat Exercise tolerated well  Goals Unmet:  Not Applicable  Comments:     Dr. Emily Filbert is Medical Director for [place] and [facility].

## 2015-08-14 DIAGNOSIS — Z951 Presence of aortocoronary bypass graft: Secondary | ICD-10-CM | POA: Diagnosis not present

## 2015-08-14 NOTE — Progress Notes (Signed)
Daily Session Note  Patient Details  Name: [name] MRN: [id number] Date of Birth: 01/29/1946 Referring Provider:    Encounter Date: 08/14/2015  Check In:     Session Check In - 08/14/15 1627    Check-In   Location [facility]   Staff Present Heath Lark, RN, BSN, CCRP;Rebecca Sickles, DPT, Tomi Bamberger, RN, BSN   Supervising physician immediately available to respond to emergencies See telemetry face sheet for immediately available ER MD   Medication changes reported     No   Fall or balance concerns reported    No   Warm-up and Cool-down Performed on first and last piece of equipment   Resistance Training Performed Yes   VAD Patient? No         Goals Met:  Exercise tolerated well No report of cardiac concerns or symptoms  Goals Unmet:  Not Applicable  Comments: Doing well with exercise prescription progression.    Dr. Emily Filbert is Medical Director for [place] and [facility].

## 2015-08-14 NOTE — Progress Notes (Signed)
Daily Session Note  Patient Details  Name: [name] MRN: [id number] Date of Birth: 07/03/1945 Referring Provider:    Encounter Date: 08/14/2015  Check In:     Session Check In - 08/14/15 1627    Check-In   Location [facility]   Staff Present Susanne Bice, RN, BSN, CCRP;Rebecca Sickles, DPT, CEEA;Diane Wright, RN, BSN   Supervising physician immediately available to respond to emergencies See telemetry face sheet for immediately available ER MD   Medication changes reported     No   Fall or balance concerns reported    No   Warm-up and Cool-down Performed on first and last piece of equipment   Resistance Training Performed Yes   VAD Patient? No         Goals Met:  Independence with exercise equipment Exercise tolerated well No report of cardiac concerns or symptoms  Goals Unmet:  Not Applicable  Comments: Patient completed exercise prescription and all exercise goals during rehab session. The exercise was tolerated well and the patient is progressing in the program.    Dr. Mark Miller is Medical Director for [facility] and [facility]. 

## 2015-08-16 DIAGNOSIS — Z951 Presence of aortocoronary bypass graft: Secondary | ICD-10-CM

## 2015-08-16 NOTE — Progress Notes (Signed)
Daily Session Note  Patient Details  Name: [name] MRN: [id number] Date of Birth: 1945-08-12 Referring Provider:    Encounter Date: 08/16/2015  Check In:     Session Check In - 08/16/15 1717    Check-In   Location [facility]   Staff Present Carson Myrtle, BS, RRT, Respiratory Therapist;Carroll Enterkin, RN, Alex Gardener, DPT, CEEA   Supervising physician immediately available to respond to emergencies See telemetry face sheet for immediately available ER MD   Medication changes reported     No   Fall or balance concerns reported    No   Warm-up and Cool-down Performed on first and last piece of equipment   Resistance Training Performed Yes   VAD Patient? No         Goals Met:  Independence with exercise equipment Exercise tolerated well No report of cardiac concerns or symptoms Strength training completed today  Goals Unmet:  Not Applicable  Comments: Patient completed exercise prescription and all exercise goals during rehab session. The exercise was tolerated well and the patient is progressing in the program.    Dr. Emily Filbert is Medical Director for [place] and [facility].

## 2015-08-17 DIAGNOSIS — Z951 Presence of aortocoronary bypass graft: Secondary | ICD-10-CM | POA: Diagnosis not present

## 2015-08-18 ENCOUNTER — Other Ambulatory Visit: Payer: Self-pay | Admitting: Physician Assistant

## 2015-08-21 DIAGNOSIS — Z951 Presence of aortocoronary bypass graft: Secondary | ICD-10-CM | POA: Diagnosis not present

## 2015-08-21 NOTE — Progress Notes (Signed)
Daily Session Note  Patient Details  Name: [name] MRN: [id number] Date of Birth: [birth year] Referring Provider:    Encounter Date: 08/21/2015  Check In:     Session Check In - 08/21/15 1613    Check-In   Location [facility]   Staff Present Heath Lark, RN, BSN, CCRP;Kalynn Declercq, DPT, Ronaldo Miyamoto, BS, ACSM CEP, Exercise Physiologist   Supervising physician immediately available to respond to emergencies See telemetry face sheet for immediately available ER MD   Medication changes reported     No   Fall or balance concerns reported    No   Warm-up and Cool-down Performed on first and last piece of equipment   Resistance Training Performed Yes   VAD Patient? No         Goals Met:  Independence with exercise equipment Exercise tolerated well No report of cardiac concerns or symptoms  Goals Unmet:  Not Applicable  Comments: Patient completed exercise prescription and all exercise goals during rehab session. The exercise was tolerated well and the patient is progressing in the program.    Dr. Emily Filbert is Medical Director for [place] and [facility].

## 2015-08-23 ENCOUNTER — Encounter: Payer: Medicare Other | Admitting: *Deleted

## 2015-08-23 DIAGNOSIS — Z951 Presence of aortocoronary bypass graft: Secondary | ICD-10-CM | POA: Diagnosis not present

## 2015-08-23 NOTE — Progress Notes (Signed)
Daily Session Note  Patient Details  Name: LAZARO ISENHOWER MRN: [id number] Date of Birth: 27-Jul-1945 Referring Provider:    Encounter Date: 08/23/2015  Check In:     Session Check In - 08/23/15 1619    Check-In   Location [facility]   Staff Present Gerlene Burdock, RN, Alex Gardener, DPT, CEEA;Diane Joya Gaskins, RN, BSN   Supervising physician immediately available to respond to emergencies See telemetry face sheet for immediately available ER MD   Medication changes reported     No   Fall or balance concerns reported    No   Warm-up and Cool-down Performed on first and last piece of equipment   Resistance Training Performed Yes   VAD Patient? No         Goals Met:  Independence with exercise equipment Exercise tolerated well No report of cardiac concerns or symptoms  Goals Unmet:  Not Applicable  Comments: Patient completed exercise prescription and all exercise goals during rehab session. The exercise was tolerated well and the patient is progressing in the program.    Dr. Emily Filbert is Medical Director for [place] and [facility].

## 2015-08-24 ENCOUNTER — Encounter: Payer: Medicare Other | Admitting: *Deleted

## 2015-08-24 VITALS — Wt 209.6 lb

## 2015-08-24 DIAGNOSIS — Z951 Presence of aortocoronary bypass graft: Secondary | ICD-10-CM

## 2015-08-24 NOTE — Progress Notes (Signed)
Daily Session Note  Patient Details  Name: [name] MRN: [id number] Date of Birth: 1945/04/22 Referring Provider:    Encounter Date: 08/24/2015  Check In:     Session Check In - 08/24/15 1643    Check-In   Location [facility]   Staff Present Gerlene Burdock, RN, Alex Gardener, DPT, Tomi Bamberger, RN, BSN   Supervising physician immediately available to respond to emergencies See telemetry face sheet for immediately available ER MD   Medication changes reported     No   Warm-up and Cool-down Performed on first and last piece of equipment   Resistance Training Performed Yes   Pain Assessment   Currently in Pain? No/denies           Exercise Prescription Changes - 08/23/15 1700    Exercise Review   Progression Yes   Response to Exercise   Blood Pressure (Admit) 140/84 mmHg   Blood Pressure (Exercise) 144/74 mmHg   Blood Pressure (Exit) 124/68 mmHg   Heart Rate (Admit) 57 bpm   Heart Rate (Exercise) 90 bpm   Heart Rate (Exit) 62 bpm   Rating of Perceived Exertion (Exercise) 12   Symptoms No   Duration Progress to 50 minutes of aerobic without signs/symptoms of physical distress   Intensity Rest + 30   Resistance Training   Training Prescription Yes   Weight 5   Reps 10-15   Interval Training   Interval Training Yes   Equipment Recumbant Bike   Comments --  Level 7-Level 8   Treadmill   MPH 3   Grade 6   Minutes 15   Recumbant Bike   Level 8   RPM 40   Watts 95   Home Exercise Plan   Plans to continue exercise at Home   Frequency --  [name] hopes to get back to riding his bike 42m/day outside.       Goals Met:  Proper associated with RPD/PD & O2 Sat Exercise tolerated well  Goals Unmet:  Not Applicable  Comments:     Dr. MEmily Filbertis Medical Director for [facility].

## 2015-08-27 ENCOUNTER — Encounter: Payer: Self-pay | Admitting: *Deleted

## 2015-08-27 DIAGNOSIS — Z951 Presence of aortocoronary bypass graft: Secondary | ICD-10-CM

## 2015-08-27 NOTE — Progress Notes (Signed)
Cardiac Individual Treatment Plan  Patient Details  Name: CORNELUIS ALLSTON MRN: [id number] Date of Birth: [birth year] Referring Provider:    Initial Encounter Date:       Cardiac Rehab from 06/29/2015 in [facility] Cardiac and Pulmonary Rehab   Date  06/29/15      Visit Diagnosis: S/P CABG x 4  Patient's Home Medications on Admission:  Current outpatient prescriptions:  .  amiodarone (PACERONE) 200 MG tablet, Take 1 tablet (200 mg total) by mouth 2 (two) times daily. For 10 Days, then decrease to 200 mg daily, Disp: 60 tablet, Rfl: 3 .  aspirin 81 MG tablet, Take 81 mg by mouth daily., Disp: , Rfl:  .  atorvastatin (LIPITOR) 40 MG tablet, Take 1 tablet (40 mg total) by mouth daily., Disp: 30 tablet, Rfl: 6 .  folic acid (FOLVITE) 1 MG tablet, Take 1 tablet (1 mg total) by mouth daily. (Patient not taking: Reported on 06/29/2015), Disp: 30 tablet, Rfl: 0 .  glipiZIDE (GLUCOTROL) 5 MG tablet, Take 1 tablet (5 mg total) by mouth 2 (two) times daily., Disp: 180 tablet, Rfl: 2 .  glucose blood (FREESTYLE TEST STRIPS) test strip, 1 each by Other route daily. Use as instructed- E11.9, Disp: 50 each, Rfl: 0 .  losartan (COZAAR) 50 MG tablet, Take by mouth., Disp: , Rfl:  .  metFORMIN (GLUCOPHAGE) 500 MG tablet, Take 500 mg by mouth 2 (two) times daily with a meal., Disp: , Rfl:  .  metoprolol tartrate (LOPRESSOR) 25 MG tablet, Take 0.5 tablets (12.5 mg total) by mouth 2 (two) times daily., Disp: 60 tablet, Rfl: 6 .  VIAGRA 50 MG tablet, TAKE 1 TABLET BY MOUTH AS NEEDED, Disp: 7 tablet, Rfl: 0  Past Medical History: Past Medical History  Diagnosis Date  . Hypertension   . Coronary artery disease   . Hyperlipidemia   . Type II diabetes mellitus (HCC)     Tobacco Use: History  Smoking status  . Never Smoker   Smokeless tobacco  . Never Used    Labs: Recent Review Flowsheet Data    Labs for ITP Cardiac and Pulmonary Rehab Latest Ref Rng 04/21/2015 04/21/2015 04/21/2015 04/21/2015 04/22/2015    PHART 7.350 - 7.450 7.392 7.337(L) 7.286(L) - -   PCO2ART 35.0 - 45.0 mmHg 38.5 42.5 46.2(H) - -   HCO3 20.0 - 24.0 mEq/L 23.5 22.8 22.1 - -   TCO2 0 - 100 mmol/L '25 24 23 22 23   ' ACIDBASEDEF 0.0 - 2.0 mmol/L 1.0 3.0(H) 5.0(H) - -   O2SAT - 94.0 97.0 98.0 - -       Exercise Target Goals:    Exercise Program Goal: Individual exercise prescription set with THRR, safety & activity barriers. Participant demonstrates ability to understand and report RPE using BORG scale, to self-measure pulse accurately, and to acknowledge the importance of the exercise prescription.  Exercise Prescription Goal: Starting with aerobic activity 30 plus minutes a day, 3 days per week for initial exercise prescription. Provide home exercise prescription and guidelines that participant acknowledges understanding prior to discharge.  Activity Barriers & Risk Stratification:     Activity Barriers & Cardiac Risk Stratification - 06/29/15 1242    Activity Barriers & Cardiac Risk Stratification   Activity Barriers None   Cardiac Risk Stratification High      6 Minute Walk:     6 Minute Walk      06/29/15 1301 08/24/15 1641     6 Minute Walk   Phase Initial  Discharge    Distance 1495 feet 1718 feet    Walk Time 6 minutes 6 minutes    # of Rest Breaks 0     RPE 11 11    Symptoms No No    Resting HR 60 bpm 57 bpm    Resting BP 132/80 mmHg 142/80 mmHg    Max Ex. HR 90 bpm 80 bpm    Max Ex. BP 128/74 mmHg 142/82 mmHg       Initial Exercise Prescription:     Initial Exercise Prescription - 06/29/15 1300    Date of Initial Exercise RX and Referring Provider   Date 06/29/15   Treadmill   MPH 3   Grade 0   Minutes 10   Recumbant Bike   Level 3   RPM 40   Watts 20   NuStep   Level 2   Watts 40   Minutes 10   Arm Ergometer   Level 1   Watts 10   Minutes 10   Recumbant Elliptical   Level 2   RPM 40   Watts 20   Minutes 10   REL-XR   Level 3   Watts 50   Minutes 10   T5 Nustep    Level 1   Watts 20   Minutes 10   Biostep-RELP   Level 2   Watts 40   Minutes 10   Prescription Details   Frequency (times per week) 3   Duration Progress to 45 minutes of aerobic exercise without signs/symptoms of physical distress   Intensity   THRR REST +  30   Ratings of Perceived Exertion 11-15   Perceived Dyspnea 0-4   Progression   Progression Continue progressive overload as per policy without signs/symptoms or physical distress.   Resistance Training   Training Prescription Yes   Weight 2   Reps 10-15      Perform Capillary Blood Glucose checks as needed.  Exercise Prescription Changes:     Exercise Prescription Changes      07/17/15 1600 07/20/15 1600 07/26/15 1600 07/27/15 1452 08/03/15 1700   Exercise Review   Progression Yes Yes Yes Yes Yes   Response to Exercise   Blood Pressure (Admit)    154/76 mmHg    Blood Pressure (Exercise)    150/62 mmHg    Blood Pressure (Exit)    144/62 mmHg    Heart Rate (Admit)    63 bpm    Heart Rate (Exercise)    109 bpm    Heart Rate (Exit)    61 bpm    Rating of Perceived Exertion (Exercise)    14    Symptoms    No    Duration    Progress to 50 minutes of aerobic without signs/symptoms of physical distress Progress to 50 minutes of aerobic without signs/symptoms of physical distress   Intensity    Rest + 30 Rest + 30   Resistance Training   Training Prescription Yes Yes Yes Yes Yes   Weight '2 2 2 3 4   ' Reps 10-15 10-15 10-15 10-15 10-15   Interval Training   Interval Training    Yes Yes   Equipment    Recumbant Bike Recumbant Bike   Comments    --  Level 7-Level 8 --  Level 7-Level 8   Treadmill   MPH '3 3 3 3 3   ' Grade '2 2 2 4 4   ' Minutes '12 12 12 15 ' 12  Recumbant Bike   Level '5 5 5 8 8   ' RPM 40 40 40     Watts '20 20 20 ' 95 95   NuStep   Level '2 2 2     ' Watts 40 40 40     Minutes '10 10 10     ' Arm Ergometer   Level '1 1 1     ' Watts '10 10 10     ' Minutes '10 10 10     ' Recumbant Elliptical   Level '2 2 2      ' RPM 40 40 40     Watts '20 20 20     ' Minutes '10 10 10     ' REL-XR   Level '3 3 3     ' Watts 50 50 50     Minutes '10 10 10     ' T5 Nustep   Level '1 1 1     ' Watts '20 20 20     ' Minutes '10 10 10     ' Biostep-RELP   Level '2 2 2     ' Watts 40 40 40     Minutes '10 10 10       ' 08/10/15 1700 08/22/15 1300 08/23/15 1700       Exercise Review   Progression Yes Yes Yes     Response to Exercise   Blood Pressure (Admit)  140/84 mmHg 140/84 mmHg     Blood Pressure (Exercise)  144/74 mmHg 144/74 mmHg     Blood Pressure (Exit)  124/68 mmHg 124/68 mmHg     Heart Rate (Admit)  57 bpm 57 bpm     Heart Rate (Exercise)  90 bpm 90 bpm     Heart Rate (Exit)  62 bpm 62 bpm     Rating of Perceived Exertion (Exercise)  12 12     Symptoms  No No     Duration Progress to 50 minutes of aerobic without signs/symptoms of physical distress Progress to 50 minutes of aerobic without signs/symptoms of physical distress Progress to 50 minutes of aerobic without signs/symptoms of physical distress     Intensity Rest + 30 Rest + 30 Rest + 30     Resistance Training   Training Prescription Yes Yes Yes     Weight '4 5 5     ' Reps 10-15 10-15 10-15     Interval Training   Interval Training Yes Yes Yes     Equipment Recumbant Bike Recumbant Bike Recumbant Bike     Comments --  Level 7-Level 8 --  Level 7-Level 8 --  Level 7-Level 8     Treadmill   MPH '3 3 3     ' Grade '4 6 6     ' Minutes '12 15 15     ' Recumbant Bike   Level '4 8 8     ' RPM 40 40 40     Watts 95 95 95     Home Exercise Plan   Plans to continue exercise at   Home     Frequency   --  Tim hopes to get back to riding his bike 45m/day outside.         Exercise Comments:     Exercise Comments      07/17/15 1626 07/28/15 1457 08/22/15 1333       Exercise Comments Reviewed individualized exercise prescription and made increases per departmental policy. Exercise increases were discussed with the patient and they were able to perform the new work loads  without issue (no signs or symptoms).  [name] has been attending Heart Track regularly and making progress in tolerance to exercise in both duration and intensity.  He is able to exercise successfully without complications.  Goal will be to continue to progress in Heart Track per program policy [name] has continued to progress in Heart Track and has been tolerating Interval Training well with no difficulties.  He is able to utilize exercise equipment with minimal set-up and should be able to continue exercise in a community setting upon completion of cardiac rehab.  Goal will be to determine follow up exercise plan and assist in implementing it.        Discharge Exercise Prescription (Final Exercise Prescription Changes):     Exercise Prescription Changes - 08/23/15 1700    Exercise Review   Progression Yes   Response to Exercise   Blood Pressure (Admit) 140/84 mmHg   Blood Pressure (Exercise) 144/74 mmHg   Blood Pressure (Exit) 124/68 mmHg   Heart Rate (Admit) 57 bpm   Heart Rate (Exercise) 90 bpm   Heart Rate (Exit) 62 bpm   Rating of Perceived Exertion (Exercise) 12   Symptoms No   Duration Progress to 50 minutes of aerobic without signs/symptoms of physical distress   Intensity Rest + 30   Resistance Training   Training Prescription Yes   Weight 5   Reps 10-15   Interval Training   Interval Training Yes   Equipment Recumbant Bike   Comments --  Level 7-Level 8   Treadmill   MPH 3   Grade 6   Minutes 15   Recumbant Bike   Level 8   RPM 40   Watts 95   Home Exercise Plan   Plans to continue exercise at Home   Frequency --  [name] hopes to get back to riding his bike 4m/day outside.       Nutrition:  Target Goals: Understanding of nutrition guidelines, daily intake of sodium <15039m cholesterol <20077mcalories 30% from fat and 7% or less from saturated fats, daily to have 5 or more servings of fruits and vegetables.  Biometrics:     Pre Biometrics - 06/29/15 1302     Pre Biometrics   Height 5' 10.1" (1.781 m)   Weight 209 lb 3.2 oz (94.892 kg)   Waist Circumference 42 inches   Hip Circumference 41 inches   Waist to Hip Ratio 1.02 %   BMI (Calculated) 30         Post Biometrics - 08/24/15 1649     Post  Biometrics   Weight 209 lb 9.6 oz (95.074 kg)   Waist Circumference 41.5 inches   Hip Circumference 41.2 inches   Waist to Hip Ratio 1.01 %      Nutrition Therapy Plan and Nutrition Goals:     Nutrition Therapy & Goals - 07/06/15 1740    Nutrition Therapy   Diet 1900kcal Diabetic with DASH guidelines   Drug/Food Interactions Statins/Certain Fruits   Protein (specify units) 8oz   Fiber 30 grams   Whole Grain Foods 3 servings   Saturated Fats 14 max. grams   Fruits and Vegetables 5 servings/day   Sodium 1500 grams   Personal Nutrition Goals   Personal Goal #1 Continue with current healthy food choices.   Comments Patient reports having worked on food portions since his surgery. He feels he is following a heart healthy pattern.    Intervention Plan   Intervention Nutrition handout(s) given to patient.;Prescribe, educate  and counsel regarding individualized specific dietary modifications aiming towards targeted core components such as weight, hypertension, lipid management, diabetes, heart failure and other comorbidities.   Expected Outcomes Short Term Goal: A plan has been developed with personal nutrition goals set during dietitian appointment.      Nutrition Discharge: Rate Your Plate Scores:     Nutrition Assessments - 08/24/15 1647    Rate Your Plate Scores   Post Score 67   Post Score % 74.4 %      Nutrition Goals Re-Evaluation:   Psychosocial: Target Goals: Acknowledge presence or absence of depression, maximize coping skills, provide positive support system. Participant is able to verbalize types and ability to use techniques and skills needed for reducing stress and depression.  Initial Review & Psychosocial  Screening:     Initial Psych Review & Screening - 06/29/15 [place]? Yes   Barriers   Psychosocial barriers to participate in program There are no identifiable barriers or psychosocial needs.   Screening Interventions   Interventions Encouraged to exercise      Quality of Life Scores:     Quality of Life - 08/24/15 1743    Quality of Life Scores   Health/Function Pre 21.33 %   Health/Function Post 22.4 %   Health/Function % Change 5.02 %   Socioeconomic Pre 22.5 %   Socioeconomic Post 22.5 %   Socioeconomic % Change  0 %   Psych/Spiritual Pre 21.71 %   Psych/Spiritual Post 22.43 %   Psych/Spiritual % Change 3.32 %   Family Pre 22.5 %   Family Post 21.4 %   Family % Change -4.89 %   GLOBAL Pre 21.82 %   GLOBAL Post 22.28 %   GLOBAL % Change 2.11 %      PHQ-9:     Recent Review Flowsheet Data    Depression screen [facility] 2/9 08/24/2015 06/29/2015 12/01/2014   Decreased Interest 0 0 0   Down, Depressed, Hopeless 0 0 0   PHQ - 2 Score 0 0 0   Altered sleeping 0 0 -   Tired, decreased energy 1 1 -   Change in appetite 0 1 -   Feeling bad or failure about yourself  0 0 -   Trouble concentrating 0 0 -   Moving slowly or fidgety/restless 0 0 -   Suicidal thoughts 0 0 -   PHQ-9 Score 1 2 -   Difficult doing work/chores Not difficult at all Not difficult at all -      Psychosocial Evaluation and Intervention:     Psychosocial Evaluation - 07/03/15 1701    Psychosocial Evaluation & Interventions   Interventions Encouraged to exercise with the program and follow exercise prescription   Comments counselor met with Mr. Hicklin for initial psychosocial evaluation.  He is a 70 year old retired gentleman who had quadruple bypass surgery on 04/21/15.  He has a strong support system with a spouse of 18 years and an adult son who lives in the home.  Mr. Thibault has diabetes also, but states he sleeps well and has a good appetite.  He denies a  history of depression or anxiety or any current symptoms.  Mr. Schneller states he is typically in a positive mood and has minimal stress in his life.  He has goals for increasing his stamina so he can return to his normal activities such as participating in a hiking group and bike riding.  Counselor  encouraged him to exercise consistently since those are outdoor activities and can be subject to the weather.  Mr. Chisolm reports also being a member of the [employer] and participating in [employer] if needed.        Psychosocial Re-Evaluation:   Vocational Rehabilitation: Provide vocational rehab assistance to qualifying candidates.   Vocational Rehab Evaluation & Intervention:     Vocational Rehab - 06/29/15 1242    Initial Vocational Rehab Evaluation & Intervention   Assessment shows need for Vocational Rehabilitation No      Education: Education Goals: Education classes will be provided on a weekly basis, covering required topics. Participant will state understanding/return demonstration of topics presented.  Learning Barriers/Preferences:     Learning Barriers/Preferences - 06/29/15 1242    Learning Barriers/Preferences   Learning Barriers None   Learning Preferences None      Education Topics: General Nutrition Guidelines/Fats and Fiber: -Group instruction provided by verbal, written material, models and posters to present the general guidelines for heart healthy nutrition. Gives an explanation and review of dietary fats and fiber.          Cardiac Rehab from 08/23/2015 in [facility] Cardiac and Pulmonary Rehab   Date  07/10/15   Educator  PI   Instruction Review Code  2- meets goals/outcomes      Controlling Sodium/Reading Food Labels: -Group verbal and written material supporting the discussion of sodium use in heart healthy nutrition. Review and explanation with models, verbal and written materials for utilization of the food label.      Cardiac Rehab from 08/23/2015 in [facility]  Cardiac and Pulmonary Rehab   Date  07/17/15   Educator  PI   Instruction Review Code  2- meets goals/outcomes      Exercise Physiology & Risk Factors: - Group verbal and written instruction with models to review the exercise physiology of the cardiovascular system and associated critical values. Details cardiovascular disease risk factors and the goals associated with each risk factor.      Cardiac Rehab from 08/23/2015 in [facility] Cardiac and Pulmonary Rehab   Date  07/26/15   Educator  Salley Hews, PT   Instruction Review Code  2- meets goals/outcomes      Aerobic Exercise & Resistance Training: - Gives group verbal and written discussion on the health impact of inactivity. On the components of aerobic and resistive training programs and the benefits of this training and how to safely progress through these programs.      Cardiac Rehab from 08/23/2015 in [facility] Cardiac and Pulmonary Rehab   Date  07/31/15   Educator  BS   Instruction Review Code  2- meets goals/outcomes      Flexibility, Balance, General Exercise Guidelines: - Provides group verbal and written instruction on the benefits of flexibility and balance training programs. Provides general exercise guidelines with specific guidelines to those with heart or lung disease. Demonstration and skill practice provided.   Stress Management: - Provides group verbal and written instruction about the health risks of elevated stress, cause of high stress, and healthy ways to reduce stress.      Cardiac Rehab from 08/23/2015 in [facility] Cardiac and Pulmonary Rehab   Date  08/09/15   Educator  Berle Mull, MSW   Instruction Review Code  2- meets goals/outcomes      Depression: - Provides group verbal and written instruction on the correlation between heart/lung disease and depressed mood, treatment options, and the stigmas associated with seeking treatment.   Anatomy &  Physiology of the Heart: - Group verbal and written instruction  and models provide basic cardiac anatomy and physiology, with the coronary electrical and arterial systems. Review of: AMI, Angina, Valve disease, Heart Failure, Cardiac Arrhythmia, Pacemakers, and the ICD.   Cardiac Procedures: - Group verbal and written instruction and models to describe the testing methods done to diagnose heart disease. Reviews the outcomes of the test results. Describes the treatment choices: Medical Management, Angioplasty, or Coronary Bypass Surgery.      Cardiac Rehab from 08/23/2015 in [facility] Cardiac and Pulmonary Rehab   Date  08/14/15   Educator  SB   Instruction Review Code  2- meets goals/outcomes      Cardiac Medications: - Group verbal and written instruction to review commonly prescribed medications for heart disease. Reviews the medication, class of the drug, and side effects. Includes the steps to properly store meds and maintain the prescription regimen.      Cardiac Rehab from 08/23/2015 in [facility] Cardiac and Pulmonary Rehab   Date  08/23/15   Educator  DW   Instruction Review Code  2- meets goals/outcomes      Go Sex-Intimacy & Heart Disease, Get SMART - Goal Setting: - Group verbal and written instruction through game format to discuss heart disease and the return to sexual intimacy. Provides group verbal and written material to discuss and apply goal setting through the application of the S.M.A.R.T. Method.      Cardiac Rehab from 08/23/2015 in [facility] Cardiac and Pulmonary Rehab   Date  08/14/15   Educator  SB   Instruction Review Code  2- meets goals/outcomes      Other Matters of the Heart: - Provides group verbal, written materials and models to describe Heart Failure, Angina, Valve Disease, and Diabetes in the realm of heart disease. Includes description of the disease process and treatment options available to the cardiac patient.      Cardiac Rehab from 08/23/2015 in [facility] Cardiac and Pulmonary Rehab   Date  08/07/15   Educator  SB   Instruction  Review Code  2- meets goals/outcomes      Exercise & Equipment Safety: - Individual verbal instruction and demonstration of equipment use and safety with use of the equipment.      Cardiac Rehab from 08/23/2015 in [facility] Cardiac and Pulmonary Rehab   Date  06/29/15   Educator  C. EnterkinRN   Instruction Review Code  1- partially meets, needs review/practice      Infection Prevention: - Provides verbal and written material to individual with discussion of infection control including proper hand washing and proper equipment cleaning during exercise session.      Cardiac Rehab from 08/23/2015 in [facility] Cardiac and Pulmonary Rehab   Date  06/29/15   Educator  C. EnterkinRN   Instruction Review Code  2- meets goals/outcomes      Falls Prevention: - Provides verbal and written material to individual with discussion of falls prevention and safety.      Cardiac Rehab from 08/23/2015 in [facility] Cardiac and Pulmonary Rehab   Date  06/29/15   Educator  C. [place]   Instruction Review Code  2- meets goals/outcomes      Diabetes: - Individual verbal and written instruction to review signs/symptoms of diabetes, desired ranges of glucose level fasting, after meals and with exercise. Advice that pre and post exercise glucose checks will be done for 3 sessions at entry of program.      Cardiac Rehab from 08/23/2015  in [facility] Cardiac and Pulmonary Rehab   Date  06/29/15   Educator  C. [place]   Instruction Review Code  2- meets goals/outcomes       Knowledge Questionnaire Score:     Knowledge Questionnaire Score - 08/24/15 1651    Knowledge Questionnaire Score   Post Score 23      Core Components/Risk Factors/Patient Goals at Admission:     Personal Goals and Risk Factors at Admission - 06/29/15 1347    Core Components/Risk Factors/Patient Goals on Admission    Weight Management Yes   Intervention Weight Management: Develop a combined nutrition and exercise program designed to reach  desired caloric intake, while maintaining appropriate intake of nutrient and fiber, sodium and fats, and appropriate energy expenditure required for the weight goal.;Weight Management/Obesity: Establish reasonable short term and long term weight goals.;Obesity: Provide education and appropriate resources to help participant work on and attain dietary goals.   Admit Weight 209 lb 3.2 oz (94.892 kg)   Goal Weight: Short Term 205 lb (92.987 kg)   Goal Weight: Long Term 190 lb (86.183 kg)   Diabetes Yes   Intervention Provide education about signs/symptoms and action to take for hypo/hyperglycemia.;Provide education about proper nutrition, including hydration, and aerobic/resistive exercise prescription along with prescribed medications to achieve blood glucose in normal ranges: Fasting glucose 65-99 mg/dL   Expected Outcomes Short Term: Participant verbalizes understanding of the signs/symptoms and immediate care of hyper/hypoglycemia, proper foot care and importance of medication, aerobic/resistive exercise and nutrition plan for blood glucose control.;Long Term: Attainment of HbA1C < 7%.      Core Components/Risk Factors/Patient Goals Review:    Core Components/Risk Factors/Patient Goals at Discharge (Final Review):    ITP Comments:     ITP Comments      06/29/15 1351 06/29/15 1548 07/30/15 1045 08/23/15 1718 08/27/15 1102   ITP Comments " Tim" Ganon keeps very detailed records of his blood sugars which fasting have been 90s or high 80s. He checks his blood pressure every day and it has been up a little 140s so MD started him on new medicine. See med list.  30 Day Review. Continue with the ITP.  New to program 30 Day review. Continue with ITP [name] said he met with the Cardiac Rehab Registered Dietician but he felt he already knew the info but he said it is always good to get it reinforced. Tim said he used to bike 40 miles a day somedays and can only bike outside 5 miles and wishes he has more  energy. Tim has increased on the REcumbent Bike and sweats each time he exercises. Tim said wen he finishes CR he will go back to hiking, riding his bike outside. 30 day review. Continue with ITP      Comments:

## 2015-08-28 DIAGNOSIS — Z951 Presence of aortocoronary bypass graft: Secondary | ICD-10-CM | POA: Diagnosis not present

## 2015-08-28 NOTE — Progress Notes (Signed)
Daily Session Note  Patient Details  Name: [name] MRN: [id number] Date of Birth: [birth year] Referring Provider:    Encounter Date: 08/28/2015  Check In:     Session Check In - 08/28/15 1619    Check-In   Location [facility]   Staff Present Jeanell Sparrow, DPT, Burlene Arnt, BA, ACSM CEP, Exercise Physiologist;Kelly Amedeo Plenty, BS, ACSM CEP, Exercise Physiologist;Diane Joya Gaskins, RN, BSN   Supervising physician immediately available to respond to emergencies See telemetry face sheet for immediately available ER MD   Medication changes reported     No   Fall or balance concerns reported    No   Warm-up and Cool-down Performed on first and last piece of equipment   Resistance Training Performed Yes   VAD Patient? No         Goals Met:  Independence with exercise equipment Exercise tolerated well No report of cardiac concerns or symptoms  Goals Unmet:  Not Applicable  Comments: Patient completed exercise prescription and all exercise goals during rehab session. The exercise was tolerated well and the patient is progressing in the program.    Dr. Emily Filbert is Medical Director for [place] and [facility].

## 2015-08-30 ENCOUNTER — Encounter: Payer: Medicare Other | Admitting: *Deleted

## 2015-08-30 DIAGNOSIS — Z951 Presence of aortocoronary bypass graft: Secondary | ICD-10-CM | POA: Diagnosis not present

## 2015-08-30 NOTE — Progress Notes (Signed)
Daily Session Note  Patient Details  Name: [name] MRN: [id number] Date of Birth: 09/01/1945 Referring Provider:    Encounter Date: 08/30/2015  Check In:     Session Check In - 08/30/15 1635    Check-In   Location [facility]   Staff Present Carroll Enterkin, RN, BSN;Rebecca Sickles, DPT, CEEA;Amanda Sommer, BA, ACSM CEP, Exercise Physiologist;Diane Wright, RN, BSN   Supervising physician immediately available to respond to emergencies See telemetry face sheet for immediately available ER MD   Medication changes reported     No   Fall or balance concerns reported    No   Warm-up and Cool-down Performed on first and last piece of equipment   Resistance Training Performed Yes   VAD Patient? No   Pain Assessment   Currently in Pain? No/denies           Exercise Prescription Changes - 08/30/15 1600    Exercise Review   Progression Yes   Response to Exercise   Duration Progress to 50 minutes of aerobic without signs/symptoms of physical distress   Intensity THRR New  THRR 40 - 80%  60-121   Resistance Training   Training Prescription Yes   Weight 5   Reps 10-15   Interval Training   Interval Training Yes   Equipment Recumbant Bike   Comments --  Level 7-Level 8   Treadmill   MPH 3   Grade 6   Minutes 15   Recumbant Bike   Level 8   RPM 40   Watts 95   Home Exercise Plan   Plans to continue exercise at Home   Frequency --  Tim hopes to get back to riding his bike 40mi/day outside.       Goals Met:  Independence with exercise equipment Exercise tolerated well Personal goals reviewed No report of cardiac concerns or symptoms Strength training completed today  Goals Unmet:  Not Applicable  Comments:  Patient completed exercise prescription and all exercise goals during rehab session. The exercise was tolerated well and the patient is progressing in the program.    Dr. Mark Miller is Medical Director for [facility] and [facility]. 

## 2015-08-31 DIAGNOSIS — Z951 Presence of aortocoronary bypass graft: Secondary | ICD-10-CM | POA: Diagnosis not present

## 2015-08-31 NOTE — Progress Notes (Signed)
Daily Session Note  Patient Details  Name: [name] MRN: [id number] Date of Birth: 1946/04/05 Referring Provider:    Encounter Date: 08/31/2015  Check In:     Session Check In - 08/31/15 1636    Check-In   Location [facility]   Staff Present Gerlene Burdock, RN, BSN;Ayame Rena, DPT, Burlene Arnt, BA, ACSM CEP, Exercise Physiologist;Diane Joya Gaskins, RN, BSN   Supervising physician immediately available to respond to emergencies See telemetry face sheet for immediately available ER MD   Medication changes reported     No   Fall or balance concerns reported    No   Warm-up and Cool-down Performed on first and last piece of equipment   Resistance Training Performed Yes   VAD Patient? No   Pain Assessment   Currently in Pain? No/denies   Multiple Pain Sites No         Goals Met:  Independence with exercise equipment Exercise tolerated well Strength training completed today  Goals Unmet:  Not Applicable  Comments: Patient completed exercise prescription and all exercise goals during rehab session. The exercise was tolerated well and the patient is progressing in the program.    Dr. Emily Filbert is Medical Director for [place] and [facility].

## 2015-09-05 ENCOUNTER — Other Ambulatory Visit: Payer: Self-pay | Admitting: Family Medicine

## 2015-09-06 ENCOUNTER — Encounter: Payer: Medicare Other | Admitting: *Deleted

## 2015-09-06 DIAGNOSIS — Z951 Presence of aortocoronary bypass graft: Secondary | ICD-10-CM

## 2015-09-06 NOTE — Progress Notes (Signed)
Discharge Summary  Patient Details  Name: [name] MRN: [id number] Date of Birth: 09-06-1945 Referring Provider:     Number of Visits: 36  Reason for Discharge:  Patient reached a stable level of exercise. Patient independent in their exercise.  Smoking History:  History  Smoking status  . Never Smoker   Smokeless tobacco  . Never Used    Diagnosis:  S/P CABG x 4  ADL UCSD:   Initial Exercise Prescription:     Initial Exercise Prescription - 06/29/15 1300    Date of Initial Exercise RX and Referring Provider   Date 06/29/15   Treadmill   MPH 3   Grade 0   Minutes 10   Recumbant Bike   Level 3   RPM 40   Watts 20   NuStep   Level 2   Watts 40   Minutes 10   Arm Ergometer   Level 1   Watts 10   Minutes 10   Recumbant Elliptical   Level 2   RPM 40   Watts 20   Minutes 10   REL-XR   Level 3   Watts 50   Minutes 10   T5 Nustep   Level 1   Watts 20   Minutes 10   Biostep-RELP   Level 2   Watts 40   Minutes 10   Prescription Details   Frequency (times per week) 3   Duration Progress to 45 minutes of aerobic exercise without signs/symptoms of physical distress   Intensity   THRR REST +  30   Ratings of Perceived Exertion 11-15   Perceived Dyspnea 0-4   Progression   Progression Continue progressive overload as per policy without signs/symptoms or physical distress.   Resistance Training   Training Prescription Yes   Weight 2   Reps 10-15      Discharge Exercise Prescription (Final Exercise Prescription Changes):     Exercise Prescription Changes - 08/30/15 1600    Exercise Review   Progression Yes   Response to Exercise   Duration Progress to 50 minutes of aerobic without signs/symptoms of physical distress   Intensity THRR New  THRR 40 - 80%  60-121   Resistance Training   Training Prescription Yes   Weight 5   Reps 10-15   Interval Training   Interval Training Yes   Equipment Recumbant Bike   Comments --  Level  7-Level 8   Treadmill   MPH 3   Grade 6   Minutes 15   Recumbant Bike   Level 8   RPM 40   Watts 95   Home Exercise Plan   Plans to continue exercise at Home   Frequency --  [name] hopes to get back to riding his bike 42mi/day outside.       Functional Capacity:     6 Minute Walk      06/29/15 1301 08/24/15 1641     6 Minute Walk   Phase Initial Discharge    Distance 1495 feet 1718 feet    Walk Time 6 minutes 6 minutes    # of Rest Breaks 0     RPE 11 11    Symptoms No No    Resting HR 60 bpm 57 bpm    Resting BP 132/80 mmHg 142/80 mmHg    Max Ex. HR 90 bpm 80 bpm    Max Ex. BP 128/74 mmHg 142/82 mmHg       Psychological, QOL, Others - Outcomes:  PHQ 2/9: Depression screen [facility] 2/9 08/24/2015 06/29/2015 12/01/2014  Decreased Interest 0 0 0  Down, Depressed, Hopeless 0 0 0  PHQ - 2 Score 0 0 0  Altered sleeping 0 0 -  Tired, decreased energy 1 1 -  Change in appetite 0 1 -  Feeling bad or failure about yourself  0 0 -  Trouble concentrating 0 0 -  Moving slowly or fidgety/restless 0 0 -  Suicidal thoughts 0 0 -  PHQ-9 Score 1 2 -  Difficult doing work/chores Not difficult at all Not difficult at all -    Quality of Life:     Quality of Life - 08/24/15 1743    Quality of Life Scores   Health/Function Pre 21.33 %   Health/Function Post 22.4 %   Health/Function % Change 5.02 %   Socioeconomic Pre 22.5 %   Socioeconomic Post 22.5 %   Socioeconomic % Change  0 %   Psych/Spiritual Pre 21.71 %   Psych/Spiritual Post 22.43 %   Psych/Spiritual % Change 3.32 %   Family Pre 22.5 %   Family Post 21.4 %   Family % Change -4.89 %   GLOBAL Pre 21.82 %   GLOBAL Post 22.28 %   GLOBAL % Change 2.11 %      Personal Goals: Goals established at orientation with interventions provided to work toward goal.     Personal Goals and Risk Factors at Admission - 06/29/15 1347    Core Components/Risk Factors/Patient Goals on Admission    Weight Management Yes    Intervention Weight Management: Develop a combined nutrition and exercise program designed to reach desired caloric intake, while maintaining appropriate intake of nutrient and fiber, sodium and fats, and appropriate energy expenditure required for the weight goal.;Weight Management/Obesity: Establish reasonable short term and long term weight goals.;Obesity: Provide education and appropriate resources to help participant work on and attain dietary goals.   Admit Weight 209 lb 3.2 oz (94.892 kg)   Goal Weight: Short Term 205 lb (92.987 kg)   Goal Weight: Long Term 190 lb (86.183 kg)   Diabetes Yes   Intervention Provide education about signs/symptoms and action to take for hypo/hyperglycemia.;Provide education about proper nutrition, including hydration, and aerobic/resistive exercise prescription along with prescribed medications to achieve blood glucose in normal ranges: Fasting glucose 65-99 mg/dL   Expected Outcomes Short Term: Participant verbalizes understanding of the signs/symptoms and immediate care of hyper/hypoglycemia, proper foot care and importance of medication, aerobic/resistive exercise and nutrition plan for blood glucose control.;Long Term: Attainment of HbA1C < 7%.       Personal Goals Discharge:   Nutrition & Weight - Outcomes:     Pre Biometrics - 06/29/15 1302    Pre Biometrics   Height 5' 10.1" (1.781 m)   Weight 209 lb 3.2 oz (94.892 kg)   Waist Circumference 42 inches   Hip Circumference 41 inches   Waist to Hip Ratio 1.02 %   BMI (Calculated) 30         Post Biometrics - 08/24/15 1649     Post  Biometrics   Weight 209 lb 9.6 oz (95.074 kg)   Waist Circumference 41.5 inches   Hip Circumference 41.2 inches   Waist to Hip Ratio 1.01 %      Nutrition:     Nutrition Therapy & Goals - 07/06/15 1740    Nutrition Therapy   Diet 1900kcal Diabetic with DASH guidelines   Drug/Food Interactions Statins/Certain Fruits   Protein (specify  units) 8oz   Fiber 30  grams   Whole Grain Foods 3 servings   Saturated Fats 14 max. grams   Fruits and Vegetables 5 servings/day   Sodium 1500 grams   Personal Nutrition Goals   Personal Goal #1 Continue with current healthy food choices.   Comments Patient reports having worked on food portions since his surgery. He feels he is following a heart healthy pattern.    Intervention Plan   Intervention Nutrition handout(s) given to patient.;Prescribe, educate and counsel regarding individualized specific dietary modifications aiming towards targeted core components such as weight, hypertension, lipid management, diabetes, heart failure and other comorbidities.   Expected Outcomes Short Term Goal: A plan has been developed with personal nutrition goals set during dietitian appointment.      Nutrition Discharge:     Nutrition Assessments - 08/24/15 1647    Rate Your Plate Scores   Post Score 67   Post Score % 74.4 %      Education Questionnaire Score:     Knowledge Questionnaire Score - 08/24/15 1651    Knowledge Questionnaire Score   Post Score 23      Goals reviewed with patient; copy given to patient.

## 2015-09-06 NOTE — Progress Notes (Signed)
Daily Session Note  Patient Details  Name: [name] MRN: [id number] Date of Birth: 05/01/1945 Referring Provider:    Encounter Date: 09/06/2015  Check In:     Session Check In - 09/06/15 1601    Check-In   Location [facility]   Staff Present Gerlene Burdock, RN, BSN;Diane Joya Gaskins, RN, Vickki Hearing, BA, ACSM CEP, Exercise Physiologist   Supervising physician immediately available to respond to emergencies See telemetry face sheet for immediately available ER MD   Medication changes reported     No   Fall or balance concerns reported    No   Warm-up and Cool-down Performed on first and last piece of equipment   Resistance Training Performed Yes   VAD Patient? No   Pain Assessment   Currently in Pain? No/denies         Goals Met:  Independence with exercise equipment Exercise tolerated well No report of cardiac concerns or symptoms Strength training completed today  Goals Unmet:  Not Applicable  Comments:  Tim completed the Cardiac Rehab program today.     Dr. Emily Filbert is Medical Director for [place] and [facility].

## 2015-09-06 NOTE — Patient Instructions (Signed)
Discharge Instructions  Patient Details  Name: [name]: [id number] Date of Birth: 1946-01-16 Referring Provider:  Teodoro Spray, MD   Number of Visits: 53  Reason for Discharge:  Patient reached a stable level of exercise. Patient independent in their exercise.  Smoking History:  History  Smoking status  . Never Smoker   Smokeless tobacco  . Never Used    Diagnosis:  S/P CABG x 4  Initial Exercise Prescription:     Initial Exercise Prescription - 06/29/15 1300    Date of Initial Exercise RX and Referring Provider   Date 06/29/15   Treadmill   MPH 3   Grade 0   Minutes 10   Recumbant Bike   Level 3   RPM 40   Watts 20   NuStep   Level 2   Watts 40   Minutes 10   Arm Ergometer   Level 1   Watts 10   Minutes 10   Recumbant Elliptical   Level 2   RPM 40   Watts 20   Minutes 10   REL-XR   Level 3   Watts 50   Minutes 10   T5 Nustep   Level 1   Watts 20   Minutes 10   Biostep-RELP   Level 2   Watts 40   Minutes 10   Prescription Details   Frequency (times per week) 3   Duration Progress to 45 minutes of aerobic exercise without signs/symptoms of physical distress   Intensity   THRR REST +  30   Ratings of Perceived Exertion 11-15   Perceived Dyspnea 0-4   Progression   Progression Continue progressive overload as per policy without signs/symptoms or physical distress.   Resistance Training   Training Prescription Yes   Weight 2   Reps 10-15      Discharge Exercise Prescription (Final Exercise Prescription Changes):     Exercise Prescription Changes - 08/30/15 1600    Exercise Review   Progression Yes   Response to Exercise   Duration Progress to 50 minutes of aerobic without signs/symptoms of physical distress   Intensity THRR New  THRR 40 - 80%  60-121   Resistance Training   Training Prescription Yes   Weight 5   Reps 10-15   Interval Training   Interval Training Yes   Equipment Recumbant Bike   Comments --   Level 7-Level 8   Treadmill   MPH 3   Grade 6   Minutes 15   Recumbant Bike   Level 8   RPM 40   Watts 95   Home Exercise Plan   Plans to continue exercise at Home   Frequency --  [name] hopes to get back to riding his bike 45mi/day outside.       Functional Capacity:     6 Minute Walk      06/29/15 1301 08/24/15 1641     6 Minute Walk   Phase Initial Discharge    Distance 1495 feet 1718 feet    Walk Time 6 minutes 6 minutes    # of Rest Breaks 0     RPE 11 11    Symptoms No No    Resting HR 60 bpm 57 bpm    Resting BP 132/80 mmHg 142/80 mmHg    Max Ex. HR 90 bpm 80 bpm    Max Ex. BP 128/74 mmHg 142/82 mmHg       Quality of Life:  Quality of Life - 08/24/15 1743    Quality of Life Scores   Health/Function Pre 21.33 %   Health/Function Post 22.4 %   Health/Function % Change 5.02 %   Socioeconomic Pre 22.5 %   Socioeconomic Post 22.5 %   Socioeconomic % Change  0 %   Psych/Spiritual Pre 21.71 %   Psych/Spiritual Post 22.43 %   Psych/Spiritual % Change 3.32 %   Family Pre 22.5 %   Family Post 21.4 %   Family % Change -4.89 %   GLOBAL Pre 21.82 %   GLOBAL Post 22.28 %   GLOBAL % Change 2.11 %      Personal Goals: Goals established at orientation with interventions provided to work toward goal.     Personal Goals and Risk Factors at Admission - 06/29/15 1347    Core Components/Risk Factors/Patient Goals on Admission    Weight Management Yes   Intervention Weight Management: Develop a combined nutrition and exercise program designed to reach desired caloric intake, while maintaining appropriate intake of nutrient and fiber, sodium and fats, and appropriate energy expenditure required for the weight goal.;Weight Management/Obesity: Establish reasonable short term and long term weight goals.;Obesity: Provide education and appropriate resources to help participant work on and attain dietary goals.   Admit Weight 209 lb 3.2 oz (94.892 kg)   Goal Weight:  Short Term 205 lb (92.987 kg)   Goal Weight: Long Term 190 lb (86.183 kg)   Diabetes Yes   Intervention Provide education about signs/symptoms and action to take for hypo/hyperglycemia.;Provide education about proper nutrition, including hydration, and aerobic/resistive exercise prescription along with prescribed medications to achieve blood glucose in normal ranges: Fasting glucose 65-99 mg/dL   Expected Outcomes Short Term: Participant verbalizes understanding of the signs/symptoms and immediate care of hyper/hypoglycemia, proper foot care and importance of medication, aerobic/resistive exercise and nutrition plan for blood glucose control.;Long Term: Attainment of HbA1C < 7%.       Personal Goals Discharge:   Nutrition & Weight - Outcomes:     Pre Biometrics - 06/29/15 1302    Pre Biometrics   Height 5' 10.1" (1.781 m)   Weight 209 lb 3.2 oz (94.892 kg)   Waist Circumference 42 inches   Hip Circumference 41 inches   Waist to Hip Ratio 1.02 %   BMI (Calculated) 30         Post Biometrics - 08/24/15 1649     Post  Biometrics   Weight 209 lb 9.6 oz (95.074 kg)   Waist Circumference 41.5 inches   Hip Circumference 41.2 inches   Waist to Hip Ratio 1.01 %      Nutrition:     Nutrition Therapy & Goals - 07/06/15 1740    Nutrition Therapy   Diet 1900kcal Diabetic with DASH guidelines   Drug/Food Interactions Statins/Certain Fruits   Protein (specify units) 8oz   Fiber 30 grams   Whole Grain Foods 3 servings   Saturated Fats 14 max. grams   Fruits and Vegetables 5 servings/day   Sodium 1500 grams   Personal Nutrition Goals   Personal Goal #1 Continue with current healthy food choices.   Comments Patient reports having worked on food portions since his surgery. He feels he is following a heart healthy pattern.    Intervention Plan   Intervention Nutrition handout(s) given to patient.;Prescribe, educate and counsel regarding individualized specific dietary modifications  aiming towards targeted core components such as weight, hypertension, lipid management, diabetes, heart failure and other comorbidities.  Expected Outcomes Short Term Goal: A plan has been developed with personal nutrition goals set during dietitian appointment.      Nutrition Discharge:     Nutrition Assessments - 08/24/15 1647    Rate Your Plate Scores   Post Score 67   Post Score % 74.4 %      Education Questionnaire Score:     Knowledge Questionnaire Score - 08/24/15 1651    Knowledge Questionnaire Score   Post Score 23      Goals reviewed with patient; copy given to patient.

## 2015-09-06 NOTE — Progress Notes (Signed)
Cardiac Individual Treatment Plan  Patient Details  Name: [name] MRN: [id number] Date of Birth: 09-22-1945 Referring Provider:    Initial Encounter Date:       Cardiac Rehab from 06/29/2015 in [facility] Cardiac and Pulmonary Rehab   Date  06/29/15      Visit Diagnosis: S/P CABG x 4  Patient's Home Medications on Admission:  Current outpatient prescriptions:  .  amiodarone (PACERONE) 200 MG tablet, Take 1 tablet (200 mg total) by mouth 2 (two) times daily. For 10 Days, then decrease to 200 mg daily, Disp: 60 tablet, Rfl: 3 .  aspirin 81 MG tablet, Take 81 mg by mouth daily., Disp: , Rfl:  .  atorvastatin (LIPITOR) 40 MG tablet, Take 1 tablet (40 mg total) by mouth daily., Disp: 30 tablet, Rfl: 6 .  folic acid (FOLVITE) 1 MG tablet, Take 1 tablet (1 mg total) by mouth daily. (Patient not taking: Reported on 06/29/2015), Disp: 30 tablet, Rfl: 0 .  glipiZIDE (GLUCOTROL) 5 MG tablet, Take 1 tablet (5 mg total) by mouth 2 (two) times daily., Disp: 180 tablet, Rfl: 2 .  glucose blood (FREESTYLE TEST STRIPS) test strip, 1 each by Other route daily. Use as instructed- E11.9, Disp: 50 each, Rfl: 0 .  losartan (COZAAR) 50 MG tablet, Take by mouth., Disp: , Rfl:  .  metFORMIN (GLUCOPHAGE) 1000 MG tablet, TAKE 1 TABLET (1,000 MG TOTAL) BY MOUTH 2 (TWO) TIMES DAILY., Disp: 60 tablet, Rfl: 0 .  metFORMIN (GLUCOPHAGE) 500 MG tablet, Take 500 mg by mouth 2 (two) times daily with a meal., Disp: , Rfl:  .  metoprolol tartrate (LOPRESSOR) 25 MG tablet, Take 0.5 tablets (12.5 mg total) by mouth 2 (two) times daily., Disp: 60 tablet, Rfl: 6 .  VIAGRA 50 MG tablet, TAKE 1 TABLET BY MOUTH AS NEEDED, Disp: 7 tablet, Rfl: 0  Past Medical History: Past Medical History  Diagnosis Date  . Hypertension   . Coronary artery disease   . Hyperlipidemia   . Type II diabetes mellitus (HCC)     Tobacco Use: History  Smoking status  . Never Smoker   Smokeless tobacco  . Never Used    Labs: Recent Review  Flowsheet Data    Labs for ITP Cardiac and Pulmonary Rehab Latest Ref Rng 04/21/2015 04/21/2015 04/21/2015 04/21/2015 04/22/2015   PHART 7.350 - 7.450 7.392 7.337(L) 7.286(L) - -   PCO2ART 35.0 - 45.0 mmHg 38.5 42.5 46.2(H) - -   HCO3 20.0 - 24.0 mEq/L 23.5 22.8 22.1 - -   TCO2 0 - 100 mmol/L _0 ACIDBASEDEF 0.0 - 2.0 mmol/L 1.0 3.0(H) 5.0(H) - -   O2SAT - 94.0 97.0 98.0 - -       Exercise Target Goals:    Exercise Program Goal: Individual exercise prescription set with THRR, safety & activity barriers. Participant demonstrates ability to understand and report RPE using BORG scale, to self-measure pulse accurately, and to acknowledge the importance of the exercise prescription.  Exercise Prescription Goal: Starting with aerobic activity 30 plus minutes a day, 3 days per week for initial exercise prescription. Provide home exercise prescription and guidelines that participant acknowledges understanding prior to discharge.  Activity Barriers & Risk Stratification:     Activity Barriers & Cardiac Risk Stratification - 06/29/15 1242    Activity Barriers & Cardiac Risk Stratification   Activity Barriers None   Cardiac Risk Stratification High      6 Minute Walk:  6 Minute Walk      06/29/15 1301 08/24/15 1641     6 Minute Walk   Phase Initial Discharge    Distance 1495 feet 1718 feet    Walk Time 6 minutes 6 minutes    # of Rest Breaks 0     RPE 11 11    Symptoms No No    Resting HR 60 bpm 57 bpm    Resting BP 132/80 mmHg 142/80 mmHg    Max Ex. HR 90 bpm 80 bpm    Max Ex. BP 128/74 mmHg 142/82 mmHg       Initial Exercise Prescription:     Initial Exercise Prescription - 06/29/15 1300    Date of Initial Exercise RX and Referring Provider   Date 06/29/15   Treadmill   MPH 3   Grade 0   Minutes 10   Recumbant Bike   Level 3   RPM 40   Watts 20   NuStep   Level 2   Watts 40   Minutes 10   Arm Ergometer   Level 1   Watts 10   Minutes 10    Recumbant Elliptical   Level 2   RPM 40   Watts 20   Minutes 10   REL-XR   Level 3   Watts 50   Minutes 10   T5 Nustep   Level 1   Watts 20   Minutes 10   Biostep-RELP   Level 2   Watts 40   Minutes 10   Prescription Details   Frequency (times per week) 3   Duration Progress to 45 minutes of aerobic exercise without signs/symptoms of physical distress   Intensity   THRR REST +  30   Ratings of Perceived Exertion 11-15   Perceived Dyspnea 0-4   Progression   Progression Continue progressive overload as per policy without signs/symptoms or physical distress.   Resistance Training   Training Prescription Yes   Weight 2   Reps 10-15      Perform Capillary Blood Glucose checks as needed.  Exercise Prescription Changes:     Exercise Prescription Changes      07/17/15 1600 07/20/15 1600 07/26/15 1600 07/27/15 1452 08/03/15 1700   Exercise Review   Progression _0    Response to Exercise   Blood Pressure (Admit)    154/76 mmHg    Blood Pressure (Exercise)    150/62 mmHg    Blood Pressure (Exit)    144/62 mmHg    Heart Rate (Admit)    63 bpm    Heart Rate (Exercise)    109 bpm    Heart Rate (Exit)    61 bpm    Rating of Perceived Exertion (Exercise)    14    Symptoms    No    Duration    Progress to 50 minutes of aerobic without signs/symptoms of physical distress Progress to 50 minutes of aerobic without signs/symptoms of physical distress   Intensity    Rest + 30 Rest + 30   Resistance Training   Training Prescription _1    Weight _2 Reps 10-15 10-15 10-15 10-15 10-15   Interval Training   Interval Training    Yes Yes   Equipment    Recumbant Bike Recumbant Bike   Comments    --  Level 7-Level 8 --  Level 7-Level 8   Treadmill   MPH  _0 Grade _1 Minutes _2 Recumbant Bike   Level _3 RPM 40 40 40     Watts _4 95 95   NuStep   Level _5 Watts 40 40 40     Minutes  _6 Arm Ergometer   Level _7 Watts _8 Minutes _9 Recumbant Elliptical   Level _10 RPM 40 40 40     Watts _11 Minutes _12 REL-XR   Level _13 Watts 50 50 50     Minutes _14 T5 Nustep   Level _15 Watts _16 Minutes _17 Biostep-RELP   Level _18 Watts 40 40 40     Minutes _19 08/10/15 1700 08/22/15 1300 08/23/15 1700 08/30/15 1600     Exercise Review   Progression Yes Yes Yes Yes    Response to Exercise   Blood Pressure (Admit)  140/84 mmHg 140/84 mmHg     Blood Pressure (Exercise)  144/74 mmHg 144/74 mmHg     Blood Pressure (Exit)  124/68 mmHg 124/68 mmHg     Heart Rate (Admit)  57 bpm 57 bpm     Heart Rate (Exercise)  90 bpm 90 bpm     Heart Rate (Exit)  62 bpm 62 bpm     Rating of Perceived Exertion (Exercise)  12 12     Symptoms  No No     Duration Progress to 50 minutes of aerobic without signs/symptoms of physical distress Progress to 50 minutes of aerobic without signs/symptoms of physical distress Progress to 50 minutes of aerobic without signs/symptoms of physical distress Progress to 50 minutes of aerobic without signs/symptoms of physical distress    Intensity Rest + 30 Rest + 30 Rest + 30 THRR New  THRR 40 - 80%  60-121    Resistance Training   Training Prescription Yes Yes Yes Yes    Weight _20 Reps 10-15 10-15 10-15 10-15    Interval Training   Interval Training Yes Yes Yes Yes    Equipment Recumbant Bike Recumbant Bike Recumbant Bike Recumbant Bike    Comments --  Level 7-Level 8 --  Level 7-Level 8 --  Level 7-Level 8 --  Level 7-Level 8    Treadmill   MPH _21 Grade _22 Minutes _23 Recumbant Bike   Level _24 RPM 40 40 40 40    Watts 95 95 95 95    Home Exercise Plan   Plans to continue exercise at   Home Home    Frequency   --  [name] hopes to get back to riding his bike 57m/day outside.  --   [name] hopes to get back to riding his bike 459mday outside.  Exercise Comments:     Exercise Comments      07/17/15 1626 07/28/15 1457 08/22/15 1333       Exercise Comments Reviewed individualized exercise prescription and made increases per departmental policy. Exercise increases were discussed with the patient and they were able to perform the new work loads without issue (no signs or symptoms).  [name] has been attending Heart Track regularly and making progress in tolerance to exercise in both duration and intensity.  He is able to exercise successfully without complications.  Goal will be to continue to progress in Heart Track per program policy [name] has continued to progress in Heart Track and has been tolerating Interval Training well with no difficulties.  He is able to utilize exercise equipment with minimal set-up and should be able to continue exercise in a community setting upon completion of cardiac rehab.  Goal will be to determine follow up exercise plan and assist in implementing it.        Discharge Exercise Prescription (Final Exercise Prescription Changes):     Exercise Prescription Changes - 08/30/15 1600    Exercise Review   Progression Yes   Response to Exercise   Duration Progress to 50 minutes of aerobic without signs/symptoms of physical distress   Intensity THRR New  THRR 40 - 80%  60-121   Resistance Training   Training Prescription Yes   Weight 5   Reps 10-15   Interval Training   Interval Training Yes   Equipment Recumbant Bike   Comments --  Level 7-Level 8   Treadmill   MPH 3   Grade 6   Minutes 15   Recumbant Bike   Level 8   RPM 40   Watts 95   Home Exercise Plan   Plans to continue exercise at Home   Frequency --  [name] hopes to get back to riding his bike 4m/day outside.       Nutrition:  Target Goals: Understanding of nutrition guidelines, daily intake of sodium '1500mg'$ , cholesterol '200mg'$ , calories 30% from fat and 7% or less  from saturated fats, daily to have 5 or more servings of fruits and vegetables.  Biometrics:     Pre Biometrics - 06/29/15 1302    Pre Biometrics   Height 5' 10.1" (1.781 m)   Weight 209 lb 3.2 oz (94.892 kg)   Waist Circumference 42 inches   Hip Circumference 41 inches   Waist to Hip Ratio 1.02 %   BMI (Calculated) 30         Post Biometrics - 08/24/15 1649     Post  Biometrics   Weight 209 lb 9.6 oz (95.074 kg)   Waist Circumference 41.5 inches   Hip Circumference 41.2 inches   Waist to Hip Ratio 1.01 %      Nutrition Therapy Plan and Nutrition Goals:     Nutrition Therapy & Goals - 07/06/15 1740    Nutrition Therapy   Diet 1900kcal Diabetic with DASH guidelines   Drug/Food Interactions Statins/Certain Fruits   Protein (specify units) 8oz   Fiber 30 grams   Whole Grain Foods 3 servings   Saturated Fats 14 max. grams   Fruits and Vegetables 5 servings/day   Sodium 1500 grams   Personal Nutrition Goals   Personal Goal #1 Continue with current healthy food choices.   Comments Patient reports having worked on food portions since his surgery. He feels he is following a heart healthy pattern.    Intervention Plan   Intervention  Nutrition handout(s) given to patient.;Prescribe, educate and counsel regarding individualized specific dietary modifications aiming towards targeted core components such as weight, hypertension, lipid management, diabetes, heart failure and other comorbidities.   Expected Outcomes Short Term Goal: A plan has been developed with personal nutrition goals set during dietitian appointment.      Nutrition Discharge: Rate Your Plate Scores:     Nutrition Assessments - 08/24/15 1647    Rate Your Plate Scores   Post Score 67   Post Score % 74.4 %      Nutrition Goals Re-Evaluation:   Psychosocial: Target Goals: Acknowledge presence or absence of depression, maximize coping skills, provide positive support system. Participant is able to  verbalize types and ability to use techniques and skills needed for reducing stress and depression.  Initial Review & Psychosocial Screening:     Initial Psych Review & Screening - 06/29/15 [place]? Yes   Barriers   Psychosocial barriers to participate in program There are no identifiable barriers or psychosocial needs.   Screening Interventions   Interventions Encouraged to exercise      Quality of Life Scores:     Quality of Life - 08/24/15 1743    Quality of Life Scores   Health/Function Pre 21.33 %   Health/Function Post 22.4 %   Health/Function % Change 5.02 %   Socioeconomic Pre 22.5 %   Socioeconomic Post 22.5 %   Socioeconomic % Change  0 %   Psych/Spiritual Pre 21.71 %   Psych/Spiritual Post 22.43 %   Psych/Spiritual % Change 3.32 %   Family Pre 22.5 %   Family Post 21.4 %   Family % Change -4.89 %   GLOBAL Pre 21.82 %   GLOBAL Post 22.28 %   GLOBAL % Change 2.11 %      PHQ-9:     Recent Review Flowsheet Data    Depression screen [facility] 2/9 08/24/2015 06/29/2015 12/01/2014   Decreased Interest 0 0 0   Down, Depressed, Hopeless 0 0 0   PHQ - 2 Score 0 0 0   Altered sleeping 0 0 -   Tired, decreased energy 1 1 -   Change in appetite 0 1 -   Feeling bad or failure about yourself  0 0 -   Trouble concentrating 0 0 -   Moving slowly or fidgety/restless 0 0 -   Suicidal thoughts 0 0 -   PHQ-9 Score 1 2 -   Difficult doing work/chores Not difficult at all Not difficult at all -      Psychosocial Evaluation and Intervention:     Psychosocial Evaluation - 07/03/15 1701    Psychosocial Evaluation & Interventions   Interventions Encouraged to exercise with the program and follow exercise prescription   Comments counselor met with Mr. Thelin for initial psychosocial evaluation.  He is a 70 year old retired gentleman who had quadruple bypass surgery on 04/21/15.  He has a strong support system with a spouse of 46 years and  an adult son who lives in the home.  Mr. Chatterjee has diabetes also, but states he sleeps well and has a good appetite.  He denies a history of depression or anxiety or any current symptoms.  Mr. Camper states he is typically in a positive mood and has minimal stress in his life.  He has goals for increasing his stamina so he can return to his normal activities such as participating in a hiking  group and bike riding.  Counselor encouraged him to exercise consistently since those are outdoor activities and can be subject to the weather.  Mr. Amores reports also being a member of the [employer] and participating in [employer] if needed.        Psychosocial Re-Evaluation:     Psychosocial Re-Evaluation      09/06/15 1701           Psychosocial Re-Evaluation   Comments Counselor follow up with Mr. Gearheart reporting he is scheduled to "graduate" from this program soon.  He reports some positive benefits with feeling stronger and able to return to most of his biking and hiking groups.  Mr. Damore states he is "a little disappointed" in his lack of stamina and is to see the Doctor this Thursday to determine if it is a medication issue.  Mr. Franchini denies current symptoms of anxiety or depression and states he is typically in a positive mood, not letting much stress him out.  He plans to consistently exercise by continuing to bike or hike with the "meetups" groups that he participates in that meet regularly.  Counselor commended Mr. Ju on his hard work and commitment to consistent exercise, which he says he learned the value of in this program.            Vocational Rehabilitation: Provide vocational rehab assistance to qualifying candidates.   Vocational Rehab Evaluation & Intervention:     Vocational Rehab - 06/29/15 1242    Initial Vocational Rehab Evaluation & Intervention   Assessment shows need for Vocational Rehabilitation No      Education: Education Goals: Education classes  will be provided on a weekly basis, covering required topics. Participant will state understanding/return demonstration of topics presented.  Learning Barriers/Preferences:     Learning Barriers/Preferences - 06/29/15 1242    Learning Barriers/Preferences   Learning Barriers None   Learning Preferences None      Education Topics: General Nutrition Guidelines/Fats and Fiber: -Group instruction provided by verbal, written material, models and posters to present the general guidelines for heart healthy nutrition. Gives an explanation and review of dietary fats and fiber.          Cardiac Rehab from 09/06/2015 in [facility] Cardiac and Pulmonary Rehab   Date  07/10/15   Educator  PI   Instruction Review Code  2- meets goals/outcomes      Controlling Sodium/Reading Food Labels: -Group verbal and written material supporting the discussion of sodium use in heart healthy nutrition. Review and explanation with models, verbal and written materials for utilization of the food label.      Cardiac Rehab from 09/06/2015 in [facility] Cardiac and Pulmonary Rehab   Date  07/17/15   Educator  PI   Instruction Review Code  2- meets goals/outcomes      Exercise Physiology & Risk Factors: - Group verbal and written instruction with models to review the exercise physiology of the cardiovascular system and associated critical values. Details cardiovascular disease risk factors and the goals associated with each risk factor.      Cardiac Rehab from 09/06/2015 in [facility] Cardiac and Pulmonary Rehab   Date  07/26/15   Educator  Salley Hews, PT   Instruction Review Code  2- meets goals/outcomes      Aerobic Exercise & Resistance Training: - Gives group verbal and written discussion on the health impact of inactivity. On the components of aerobic and resistive training programs and the benefits of this training and how  to safely progress through these programs.      Cardiac Rehab from 09/06/2015 in [facility] Cardiac and  Pulmonary Rehab   Date  07/31/15   Educator  BS   Instruction Review Code  2- meets goals/outcomes      Flexibility, Balance, General Exercise Guidelines: - Provides group verbal and written instruction on the benefits of flexibility and balance training programs. Provides general exercise guidelines with specific guidelines to those with heart or lung disease. Demonstration and skill practice provided.   Stress Management: - Provides group verbal and written instruction about the health risks of elevated stress, cause of high stress, and healthy ways to reduce stress.      Cardiac Rehab from 09/06/2015 in [facility] Cardiac and Pulmonary Rehab   Date  08/09/15   Educator  Berle Mull, MSW   Instruction Review Code  2- meets goals/outcomes      Depression: - Provides group verbal and written instruction on the correlation between heart/lung disease and depressed mood, treatment options, and the stigmas associated with seeking treatment.      Cardiac Rehab from 09/06/2015 in [facility] Cardiac and Pulmonary Rehab   Date  09/06/15   Educator  Kathreen Cornfield, [facility]   Instruction Review Code  2- meets goals/outcomes      Anatomy & Physiology of the Heart: - Group verbal and written instruction and models provide basic cardiac anatomy and physiology, with the coronary electrical and arterial systems. Review of: AMI, Angina, Valve disease, Heart Failure, Cardiac Arrhythmia, Pacemakers, and the ICD.   Cardiac Procedures: - Group verbal and written instruction and models to describe the testing methods done to diagnose heart disease. Reviews the outcomes of the test results. Describes the treatment choices: Medical Management, Angioplasty, or Coronary Bypass Surgery.      Cardiac Rehab from 09/06/2015 in [facility] Cardiac and Pulmonary Rehab   Date  08/14/15   Educator  SB   Instruction Review Code  2- meets goals/outcomes      Cardiac Medications: - Group verbal and written instruction to review commonly  prescribed medications for heart disease. Reviews the medication, class of the drug, and side effects. Includes the steps to properly store meds and maintain the prescription regimen.      Cardiac Rehab from 09/06/2015 in [facility] Cardiac and Pulmonary Rehab   Date  08/23/15   Educator  DW   Instruction Review Code  2- meets goals/outcomes      Go Sex-Intimacy & Heart Disease, Get SMART - Goal Setting: - Group verbal and written instruction through game format to discuss heart disease and the return to sexual intimacy. Provides group verbal and written material to discuss and apply goal setting through the application of the S.M.A.R.T. Method.      Cardiac Rehab from 09/06/2015 in [facility] Cardiac and Pulmonary Rehab   Date  08/14/15   Educator  SB   Instruction Review Code  2- meets goals/outcomes      Other Matters of the Heart: - Provides group verbal, written materials and models to describe Heart Failure, Angina, Valve Disease, and Diabetes in the realm of heart disease. Includes description of the disease process and treatment options available to the cardiac patient.      Cardiac Rehab from 09/06/2015 in [facility] Cardiac and Pulmonary Rehab   Date  08/07/15   Educator  SB   Instruction Review Code  2- meets goals/outcomes      Exercise & Equipment Safety: - Individual verbal instruction and demonstration of equipment  use and safety with use of the equipment.      Cardiac Rehab from 09/06/2015 in [facility] Cardiac and Pulmonary Rehab   Date  06/29/15   Educator  C. EnterkinRN   Instruction Review Code  1- partially meets, needs review/practice      Infection Prevention: - Provides verbal and written material to individual with discussion of infection control including proper hand washing and proper equipment cleaning during exercise session.      Cardiac Rehab from 09/06/2015 in [facility] Cardiac and Pulmonary Rehab   Date  06/29/15   Educator  C. EnterkinRN   Instruction Review Code  2- meets  goals/outcomes      Falls Prevention: - Provides verbal and written material to individual with discussion of falls prevention and safety.      Cardiac Rehab from 09/06/2015 in [facility] Cardiac and Pulmonary Rehab   Date  06/29/15   Educator  C. Logan   Instruction Review Code  2- meets goals/outcomes      Diabetes: - Individual verbal and written instruction to review signs/symptoms of diabetes, desired ranges of glucose level fasting, after meals and with exercise. Advice that pre and post exercise glucose checks will be done for 3 sessions at entry of program.      Cardiac Rehab from 09/06/2015 in [facility] Cardiac and Pulmonary Rehab   Date  06/29/15   Educator  C. Graf   Instruction Review Code  2- meets goals/outcomes       Knowledge Questionnaire Score:     Knowledge Questionnaire Score - 08/24/15 1651    Knowledge Questionnaire Score   Post Score 23      Core Components/Risk Factors/Patient Goals at Admission:     Personal Goals and Risk Factors at Admission - 06/29/15 1347    Core Components/Risk Factors/Patient Goals on Admission    Weight Management Yes   Intervention Weight Management: Develop a combined nutrition and exercise program designed to reach desired caloric intake, while maintaining appropriate intake of nutrient and fiber, sodium and fats, and appropriate energy expenditure required for the weight goal.;Weight Management/Obesity: Establish reasonable short term and long term weight goals.;Obesity: Provide education and appropriate resources to help participant work on and attain dietary goals.   Admit Weight 209 lb 3.2 oz (94.892 kg)   Goal Weight: Short Term 205 lb (92.987 kg)   Goal Weight: Long Term 190 lb (86.183 kg)   Diabetes Yes   Intervention Provide education about signs/symptoms and action to take for hypo/hyperglycemia.;Provide education about proper nutrition, including hydration, and aerobic/resistive exercise prescription along with  prescribed medications to achieve blood glucose in normal ranges: Fasting glucose 65-99 mg/dL   Expected Outcomes Short Term: Participant verbalizes understanding of the signs/symptoms and immediate care of hyper/hypoglycemia, proper foot care and importance of medication, aerobic/resistive exercise and nutrition plan for blood glucose control.;Long Term: Attainment of HbA1C < 7%.      Core Components/Risk Factors/Patient Goals Review:    Core Components/Risk Factors/Patient Goals at Discharge (Final Review):    ITP Comments:     ITP Comments      06/29/15 1351 06/29/15 1548 07/30/15 1045 08/23/15 1718 08/27/15 1102   ITP Comments " [name]" Harmon keeps very detailed records of his blood sugars which fasting have been 90s or high 80s. He checks his blood pressure every day and it has been up a little 140s so MD started him on new medicine. See med list.  30 Day Review. Continue with the ITP.  New to  program 30 Day review. Continue with ITP [name] said he met with the Cardiac Rehab Registered Dietician but he felt he already knew the info but he said it is always good to get it reinforced. [name] said he used to bike 40 miles a day somedays and can only bike outside 5 miles and wishes he has more energy. [name] has increased on the REcumbent Bike and sweats each time he exercises. [name] said wen he finishes CR he will go back to hiking, riding his bike outside. 30 day review. Continue with ITP      Comments: Completed 36 sessions today of Cardiac Rehab.

## 2015-09-06 NOTE — Progress Notes (Addendum)
Cardiac Individual Treatment Plan  Patient Details  Name: [name] MRN: [id number] Date of Birth: [birth year] Referring Provider:    Initial Encounter Date:       Cardiac Rehab from 06/29/2015 in [facility] Cardiac and Pulmonary Rehab   Date  06/29/15      Visit Diagnosis: S/P CABG x 4 - Plan: CARDIAC REHAB 30 DAY REVIEW  Patient's Home Medications on Admission:  Current outpatient prescriptions:  .  amiodarone (PACERONE) 200 MG tablet, Take 1 tablet (200 mg total) by mouth 2 (two) times daily. For 10 Days, then decrease to 200 mg daily, Disp: 60 tablet, Rfl: 3 .  aspirin 81 MG tablet, Take 81 mg by mouth daily., Disp: , Rfl:  .  atorvastatin (LIPITOR) 40 MG tablet, Take 1 tablet (40 mg total) by mouth daily., Disp: 30 tablet, Rfl: 6 .  folic acid (FOLVITE) 1 MG tablet, Take 1 tablet (1 mg total) by mouth daily. (Patient not taking: Reported on 06/29/2015), Disp: 30 tablet, Rfl: 0 .  glipiZIDE (GLUCOTROL) 5 MG tablet, Take 1 tablet (5 mg total) by mouth 2 (two) times daily., Disp: 180 tablet, Rfl: 2 .  glucose blood (FREESTYLE TEST STRIPS) test strip, 1 each by Other route daily. Use as instructed- E11.9, Disp: 50 each, Rfl: 0 .  losartan (COZAAR) 50 MG tablet, Take by mouth., Disp: , Rfl:  .  metFORMIN (GLUCOPHAGE) 1000 MG tablet, TAKE 1 TABLET (1,000 MG TOTAL) BY MOUTH 2 (TWO) TIMES DAILY., Disp: 60 tablet, Rfl: 0 .  metFORMIN (GLUCOPHAGE) 500 MG tablet, Take 500 mg by mouth 2 (two) times daily with a meal., Disp: , Rfl:  .  metoprolol tartrate (LOPRESSOR) 25 MG tablet, Take 0.5 tablets (12.5 mg total) by mouth 2 (two) times daily., Disp: 60 tablet, Rfl: 6 .  VIAGRA 50 MG tablet, TAKE 1 TABLET BY MOUTH AS NEEDED, Disp: 7 tablet, Rfl: 0  Past Medical History: Past Medical History  Diagnosis Date  . Hypertension   . Coronary artery disease   . Hyperlipidemia   . Type II diabetes mellitus (HCC)     Tobacco Use: History  Smoking status  . Never Smoker   Smokeless tobacco  .  Never Used    Labs: Recent Review Flowsheet Data    Labs for ITP Cardiac and Pulmonary Rehab Latest Ref Rng 04/21/2015 04/21/2015 04/21/2015 04/21/2015 04/22/2015   PHART 7.350 - 7.450 7.392 7.337(L) 7.286(L) - -   PCO2ART 35.0 - 45.0 mmHg 38.5 42.5 46.2(H) - -   HCO3 20.0 - 24.0 mEq/L 23.5 22.8 22.1 - -   TCO2 0 - 100 mmol/L '25 24 23 22 23   ' ACIDBASEDEF 0.0 - 2.0 mmol/L 1.0 3.0(H) 5.0(H) - -   O2SAT - 94.0 97.0 98.0 - -       Exercise Target Goals:    Exercise Program Goal: Individual exercise prescription set with THRR, safety & activity barriers. Participant demonstrates ability to understand and report RPE using BORG scale, to self-measure pulse accurately, and to acknowledge the importance of the exercise prescription.  Exercise Prescription Goal: Starting with aerobic activity 30 plus minutes a day, 3 days per week for initial exercise prescription. Provide home exercise prescription and guidelines that participant acknowledges understanding prior to discharge.  Activity Barriers & Risk Stratification:     Activity Barriers & Cardiac Risk Stratification - 06/29/15 1242    Activity Barriers & Cardiac Risk Stratification   Activity Barriers None   Cardiac Risk Stratification High  6 Minute Walk:     6 Minute Walk      06/29/15 1301 08/24/15 1641     6 Minute Walk   Phase Initial Discharge    Distance 1495 feet 1718 feet    Walk Time 6 minutes 6 minutes    # of Rest Breaks 0     RPE 11 11    Symptoms No No    Resting HR 60 bpm 57 bpm    Resting BP 132/80 mmHg 142/80 mmHg    Max Ex. HR 90 bpm 80 bpm    Max Ex. BP 128/74 mmHg 142/82 mmHg       Initial Exercise Prescription:     Initial Exercise Prescription - 06/29/15 1300    Date of Initial Exercise RX and Referring Provider   Date 06/29/15   Treadmill   MPH 3   Grade 0   Minutes 10   Recumbant Bike   Level 3   RPM 40   Watts 20   NuStep   Level 2   Watts 40   Minutes 10   Arm Ergometer    Level 1   Watts 10   Minutes 10   Recumbant Elliptical   Level 2   RPM 40   Watts 20   Minutes 10   REL-XR   Level 3   Watts 50   Minutes 10   T5 Nustep   Level 1   Watts 20   Minutes 10   Biostep-RELP   Level 2   Watts 40   Minutes 10   Prescription Details   Frequency (times per week) 3   Duration Progress to 45 minutes of aerobic exercise without signs/symptoms of physical distress   Intensity   THRR REST +  30   Ratings of Perceived Exertion 11-15   Perceived Dyspnea 0-4   Progression   Progression Continue progressive overload as per policy without signs/symptoms or physical distress.   Resistance Training   Training Prescription Yes   Weight 2   Reps 10-15      Perform Capillary Blood Glucose checks as needed.  Exercise Prescription Changes:     Exercise Prescription Changes      07/17/15 1600 07/20/15 1600 07/26/15 1600 07/27/15 1452 08/03/15 1700   Exercise Review   Progression Yes Yes Yes Yes Yes   Response to Exercise   Blood Pressure (Admit)    154/76 mmHg    Blood Pressure (Exercise)    150/62 mmHg    Blood Pressure (Exit)    144/62 mmHg    Heart Rate (Admit)    63 bpm    Heart Rate (Exercise)    109 bpm    Heart Rate (Exit)    61 bpm    Rating of Perceived Exertion (Exercise)    14    Symptoms    No    Duration    Progress to 50 minutes of aerobic without signs/symptoms of physical distress Progress to 50 minutes of aerobic without signs/symptoms of physical distress   Intensity    Rest + 30 Rest + 30   Resistance Training   Training Prescription Yes Yes Yes Yes Yes   Weight '2 2 2 3 4   ' Reps 10-15 10-15 10-15 10-15 10-15   Interval Training   Interval Training    Yes Yes   Equipment    Recumbant Bike Recumbant Bike   Comments    --  Level 7-Level 8 --  Level 7-Level  8   Treadmill   MPH '3 3 3 3 3   ' Grade '2 2 2 4 4   ' Minutes '12 12 12 15 12   ' Recumbant Bike   Level '5 5 5 8 8   ' RPM 40 40 40     Watts '20 20 20 ' 95 95   NuStep   Level '2  2 2     ' Watts 40 40 40     Minutes '10 10 10     ' Arm Ergometer   Level '1 1 1     ' Watts '10 10 10     ' Minutes '10 10 10     ' Recumbant Elliptical   Level '2 2 2     ' RPM 40 40 40     Watts '20 20 20     ' Minutes '10 10 10     ' REL-XR   Level '3 3 3     ' Watts 50 50 50     Minutes '10 10 10     ' T5 Nustep   Level '1 1 1     ' Watts '20 20 20     ' Minutes '10 10 10     ' Biostep-RELP   Level '2 2 2     ' Watts 40 40 40     Minutes '10 10 10       ' 08/10/15 1700 08/22/15 1300 08/23/15 1700 08/30/15 1600     Exercise Review   Progression Yes Yes Yes Yes    Response to Exercise   Blood Pressure (Admit)  140/84 mmHg 140/84 mmHg     Blood Pressure (Exercise)  144/74 mmHg 144/74 mmHg     Blood Pressure (Exit)  124/68 mmHg 124/68 mmHg     Heart Rate (Admit)  57 bpm 57 bpm     Heart Rate (Exercise)  90 bpm 90 bpm     Heart Rate (Exit)  62 bpm 62 bpm     Rating of Perceived Exertion (Exercise)  12 12     Symptoms  No No     Duration Progress to 50 minutes of aerobic without signs/symptoms of physical distress Progress to 50 minutes of aerobic without signs/symptoms of physical distress Progress to 50 minutes of aerobic without signs/symptoms of physical distress Progress to 50 minutes of aerobic without signs/symptoms of physical distress    Intensity Rest + 30 Rest + 30 Rest + 30 THRR New  THRR 40 - 80%  60-121    Resistance Training   Training Prescription Yes Yes Yes Yes    Weight '4 5 5 5    ' Reps 10-15 10-15 10-15 10-15    Interval Training   Interval Training Yes Yes Yes Yes    Equipment Recumbant Bike Recumbant Bike Recumbant Bike Recumbant Bike    Comments --  Level 7-Level 8 --  Level 7-Level 8 --  Level 7-Level 8 --  Level 7-Level 8    Treadmill   MPH '3 3 3 3    ' Grade '4 6 6 6    ' Minutes '12 15 15 15    ' Recumbant Bike   Level '4 8 8 8    ' RPM 40 40 40 40    Watts 95 95 95 95    Home Exercise Plan   Plans to continue exercise at   Home Home    Frequency   --  [name] hopes to get back to  riding his bike 64m/day outside.  --  [name] hopes to get back  to riding his bike 16m/day outside.        Exercise Comments:     Exercise Comments      07/17/15 1626 07/28/15 1457 08/22/15 1333 09/06/15 1816     Exercise Comments Reviewed individualized exercise prescription and made increases per departmental policy. Exercise increases were discussed with the patient and they were able to perform the new work loads without issue (no signs or symptoms).  TOctavia Brucknerhas been attending Heart Track regularly and making progress in tolerance to exercise in both duration and intensity.  He is able to exercise successfully without complications.  Goal will be to continue to progress in Heart Track per program policy TOctavia Brucknerhas continued to progress in Heart Track and has been tolerating Interval Training well with no difficulties.  He is able to utilize exercise equipment with minimal set-up and should be able to continue exercise in a community setting upon completion of cardiac rehab.  Goal will be to determine follow up exercise plan and assist in implementing it. C/o of shortness of breath with PVC's today when he was exercising on the recumbent bike. "[name]" WAhnafwill show it to Dr. FUbaldo Glassingat his appt in the AM.        Discharge Exercise Prescription (Final Exercise Prescription Changes):     Exercise Prescription Changes - 08/30/15 1600    Exercise Review   Progression Yes   Response to Exercise   Duration Progress to 50 minutes of aerobic without signs/symptoms of physical distress   Intensity THRR New  THRR 40 - 80%  60-121   Resistance Training   Training Prescription Yes   Weight 5   Reps 10-15   Interval Training   Interval Training Yes   Equipment Recumbant Bike   Comments --  Level 7-Level 8   Treadmill   MPH 3   Grade 6   Minutes 15   Recumbant Bike   Level 8   RPM 40   Watts 95   Home Exercise Plan   Plans to continue exercise at Home   Frequency --  TOctavia Brucknerhopes to get back to  riding his bike 479mday outside.       Nutrition:  Target Goals: Understanding of nutrition guidelines, daily intake of sodium <150043mcholesterol <200m3malories 30% from fat and 7% or less from saturated fats, daily to have 5 or more servings of fruits and vegetables.  Biometrics:     Pre Biometrics - 06/29/15 1302    Pre Biometrics   Height 5' 10.1" (1.781 m)   Weight 209 lb 3.2 oz (94.892 kg)   Waist Circumference 42 inches   Hip Circumference 41 inches   Waist to Hip Ratio 1.02 %   BMI (Calculated) 30         Post Biometrics - 08/24/15 1649     Post  Biometrics   Weight 209 lb 9.6 oz (95.074 kg)   Waist Circumference 41.5 inches   Hip Circumference 41.2 inches   Waist to Hip Ratio 1.01 %      Nutrition Therapy Plan and Nutrition Goals:     Nutrition Therapy & Goals - 07/06/15 1740    Nutrition Therapy   Diet 1900kcal Diabetic with DASH guidelines   Drug/Food Interactions Statins/Certain Fruits   Protein (specify units) 8oz   Fiber 30 grams   Whole Grain Foods 3 servings   Saturated Fats 14 max. grams   Fruits and Vegetables 5 servings/day   Sodium 1500 grams   Personal  Nutrition Goals   Personal Goal #1 Continue with current healthy food choices.   Comments Patient reports having worked on food portions since his surgery. He feels he is following a heart healthy pattern.    Intervention Plan   Intervention Nutrition handout(s) given to patient.;Prescribe, educate and counsel regarding individualized specific dietary modifications aiming towards targeted core components such as weight, hypertension, lipid management, diabetes, heart failure and other comorbidities.   Expected Outcomes Short Term Goal: A plan has been developed with personal nutrition goals set during dietitian appointment.      Nutrition Discharge: Rate Your Plate Scores:     Nutrition Assessments - 08/24/15 1647    Rate Your Plate Scores   Post Score 67   Post Score % 74.4 %       Nutrition Goals Re-Evaluation:   Psychosocial: Target Goals: Acknowledge presence or absence of depression, maximize coping skills, provide positive support system. Participant is able to verbalize types and ability to use techniques and skills needed for reducing stress and depression.  Initial Review & Psychosocial Screening:     Initial Psych Review & Screening - 06/29/15 [place]? Yes   Barriers   Psychosocial barriers to participate in program There are no identifiable barriers or psychosocial needs.   Screening Interventions   Interventions Encouraged to exercise      Quality of Life Scores:     Quality of Life - 08/24/15 1743    Quality of Life Scores   Health/Function Pre 21.33 %   Health/Function Post 22.4 %   Health/Function % Change 5.02 %   Socioeconomic Pre 22.5 %   Socioeconomic Post 22.5 %   Socioeconomic % Change  0 %   Psych/Spiritual Pre 21.71 %   Psych/Spiritual Post 22.43 %   Psych/Spiritual % Change 3.32 %   Family Pre 22.5 %   Family Post 21.4 %   Family % Change -4.89 %   GLOBAL Pre 21.82 %   GLOBAL Post 22.28 %   GLOBAL % Change 2.11 %      PHQ-9:     Recent Review Flowsheet Data    Depression screen [facility] 2/9 08/24/2015 06/29/2015 12/01/2014   Decreased Interest 0 0 0   Down, Depressed, Hopeless 0 0 0   PHQ - 2 Score 0 0 0   Altered sleeping 0 0 -   Tired, decreased energy 1 1 -   Change in appetite 0 1 -   Feeling bad or failure about yourself  0 0 -   Trouble concentrating 0 0 -   Moving slowly or fidgety/restless 0 0 -   Suicidal thoughts 0 0 -   PHQ-9 Score 1 2 -   Difficult doing work/chores Not difficult at all Not difficult at all -      Psychosocial Evaluation and Intervention:     Psychosocial Evaluation - 07/03/15 1701    Psychosocial Evaluation & Interventions   Interventions Encouraged to exercise with the program and follow exercise prescription   Comments counselor met with  Mr. Rindfleisch for initial psychosocial evaluation.  He is a 70 year old retired gentleman who had quadruple bypass surgery on 04/21/15.  He has a strong support system with a spouse of 39 years and an adult son who lives in the home.  Mr. Winsett has diabetes also, but states he sleeps well and has a good appetite.  He denies a history of depression or anxiety or  any current symptoms.  Mr. Drees states he is typically in a positive mood and has minimal stress in his life.  He has goals for increasing his stamina so he can return to his normal activities such as participating in a hiking group and bike riding.  Counselor encouraged him to exercise consistently since those are outdoor activities and can be subject to the weather.  Mr. Williard reports also being a member of the [employer] and participating in [employer] if needed.        Psychosocial Re-Evaluation:     Psychosocial Re-Evaluation      09/06/15 1701           Psychosocial Re-Evaluation   Comments Counselor follow up with Mr. Buening reporting he is scheduled to "graduate" from this program soon.  He reports some positive benefits with feeling stronger and able to return to most of his biking and hiking groups.  Mr. Staiger states he is "a little disappointed" in his lack of stamina and is to see the Doctor this Thursday to determine if it is a medication issue.  Mr. Tigges denies current symptoms of anxiety or depression and states he is typically in a positive mood, not letting much stress him out.  He plans to consistently exercise by continuing to bike or hike with the "meetups" groups that he participates in that meet regularly.  Counselor commended Mr. Frith on his hard work and commitment to consistent exercise, which he says he learned the value of in this program.            Vocational Rehabilitation: Provide vocational rehab assistance to qualifying candidates.   Vocational Rehab Evaluation & Intervention:      Vocational Rehab - 06/29/15 1242    Initial Vocational Rehab Evaluation & Intervention   Assessment shows need for Vocational Rehabilitation No      Education: Education Goals: Education classes will be provided on a weekly basis, covering required topics. Participant will state understanding/return demonstration of topics presented.  Learning Barriers/Preferences:     Learning Barriers/Preferences - 06/29/15 1242    Learning Barriers/Preferences   Learning Barriers None   Learning Preferences None      Education Topics: General Nutrition Guidelines/Fats and Fiber: -Group instruction provided by verbal, written material, models and posters to present the general guidelines for heart healthy nutrition. Gives an explanation and review of dietary fats and fiber.          Cardiac Rehab from 09/06/2015 in [facility] Cardiac and Pulmonary Rehab   Date  07/10/15   Educator  PI   Instruction Review Code  2- meets goals/outcomes      Controlling Sodium/Reading Food Labels: -Group verbal and written material supporting the discussion of sodium use in heart healthy nutrition. Review and explanation with models, verbal and written materials for utilization of the food label.      Cardiac Rehab from 09/06/2015 in [facility] Cardiac and Pulmonary Rehab   Date  07/17/15   Educator  PI   Instruction Review Code  2- meets goals/outcomes      Exercise Physiology & Risk Factors: - Group verbal and written instruction with models to review the exercise physiology of the cardiovascular system and associated critical values. Details cardiovascular disease risk factors and the goals associated with each risk factor.      Cardiac Rehab from 09/06/2015 in [facility] Cardiac and Pulmonary Rehab   Date  07/26/15   Educator  Salley Hews, PT   Instruction Review Code  2- meets goals/outcomes      Aerobic Exercise & Resistance Training: - Gives group verbal and written discussion on the health impact of  inactivity. On the components of aerobic and resistive training programs and the benefits of this training and how to safely progress through these programs.      Cardiac Rehab from 09/06/2015 in [facility] Cardiac and Pulmonary Rehab   Date  07/31/15   Educator  BS   Instruction Review Code  2- meets goals/outcomes      Flexibility, Balance, General Exercise Guidelines: - Provides group verbal and written instruction on the benefits of flexibility and balance training programs. Provides general exercise guidelines with specific guidelines to those with heart or lung disease. Demonstration and skill practice provided.   Stress Management: - Provides group verbal and written instruction about the health risks of elevated stress, cause of high stress, and healthy ways to reduce stress.      Cardiac Rehab from 09/06/2015 in [facility] Cardiac and Pulmonary Rehab   Date  08/09/15   Educator  Berle Mull, MSW   Instruction Review Code  2- meets goals/outcomes      Depression: - Provides group verbal and written instruction on the correlation between heart/lung disease and depressed mood, treatment options, and the stigmas associated with seeking treatment.      Cardiac Rehab from 09/06/2015 in [facility] Cardiac and Pulmonary Rehab   Date  09/06/15   Educator  Kathreen Cornfield, [facility]   Instruction Review Code  2- meets goals/outcomes      Anatomy & Physiology of the Heart: - Group verbal and written instruction and models provide basic cardiac anatomy and physiology, with the coronary electrical and arterial systems. Review of: AMI, Angina, Valve disease, Heart Failure, Cardiac Arrhythmia, Pacemakers, and the ICD.   Cardiac Procedures: - Group verbal and written instruction and models to describe the testing methods done to diagnose heart disease. Reviews the outcomes of the test results. Describes the treatment choices: Medical Management, Angioplasty, or Coronary Bypass Surgery.      Cardiac Rehab from  09/06/2015 in [facility] Cardiac and Pulmonary Rehab   Date  08/14/15   Educator  SB   Instruction Review Code  2- meets goals/outcomes      Cardiac Medications: - Group verbal and written instruction to review commonly prescribed medications for heart disease. Reviews the medication, class of the drug, and side effects. Includes the steps to properly store meds and maintain the prescription regimen.      Cardiac Rehab from 09/06/2015 in [facility] Cardiac and Pulmonary Rehab   Date  08/23/15   Educator  DW   Instruction Review Code  2- meets goals/outcomes      Go Sex-Intimacy & Heart Disease, Get SMART - Goal Setting: - Group verbal and written instruction through game format to discuss heart disease and the return to sexual intimacy. Provides group verbal and written material to discuss and apply goal setting through the application of the S.M.A.R.T. Method.      Cardiac Rehab from 09/06/2015 in [facility] Cardiac and Pulmonary Rehab   Date  08/14/15   Educator  SB   Instruction Review Code  2- meets goals/outcomes      Other Matters of the Heart: - Provides group verbal, written materials and models to describe Heart Failure, Angina, Valve Disease, and Diabetes in the realm of heart disease. Includes description of the disease process and treatment options available to the cardiac patient.      Cardiac Rehab from  09/06/2015 in [facility] Cardiac and Pulmonary Rehab   Date  08/07/15   Educator  SB   Instruction Review Code  2- meets goals/outcomes      Exercise & Equipment Safety: - Individual verbal instruction and demonstration of equipment use and safety with use of the equipment.      Cardiac Rehab from 09/06/2015 in [facility] Cardiac and Pulmonary Rehab   Date  06/29/15   Educator  C. EnterkinRN   Instruction Review Code  1- partially meets, needs review/practice      Infection Prevention: - Provides verbal and written material to individual with discussion of infection control including proper  hand washing and proper equipment cleaning during exercise session.      Cardiac Rehab from 09/06/2015 in [facility] Cardiac and Pulmonary Rehab   Date  06/29/15   Educator  C. EnterkinRN   Instruction Review Code  2- meets goals/outcomes      Falls Prevention: - Provides verbal and written material to individual with discussion of falls prevention and safety.      Cardiac Rehab from 09/06/2015 in [facility] Cardiac and Pulmonary Rehab   Date  06/29/15   Educator  C. Jordan   Instruction Review Code  2- meets goals/outcomes      Diabetes: - Individual verbal and written instruction to review signs/symptoms of diabetes, desired ranges of glucose level fasting, after meals and with exercise. Advice that pre and post exercise glucose checks will be done for 3 sessions at entry of program.      Cardiac Rehab from 09/06/2015 in [facility] Cardiac and Pulmonary Rehab   Date  06/29/15   Educator  C. Tomball   Instruction Review Code  2- meets goals/outcomes       Knowledge Questionnaire Score:     Knowledge Questionnaire Score - 08/24/15 1651    Knowledge Questionnaire Score   Post Score 23      Core Components/Risk Factors/Patient Goals at Admission:     Personal Goals and Risk Factors at Admission - 06/29/15 1347    Core Components/Risk Factors/Patient Goals on Admission    Weight Management Yes   Intervention Weight Management: Develop a combined nutrition and exercise program designed to reach desired caloric intake, while maintaining appropriate intake of nutrient and fiber, sodium and fats, and appropriate energy expenditure required for the weight goal.;Weight Management/Obesity: Establish reasonable short term and long term weight goals.;Obesity: Provide education and appropriate resources to help participant work on and attain dietary goals.   Admit Weight 209 lb 3.2 oz (94.892 kg)   Goal Weight: Short Term 205 lb (92.987 kg)   Goal Weight: Long Term 190 lb (86.183 kg)   Diabetes  Yes   Intervention Provide education about signs/symptoms and action to take for hypo/hyperglycemia.;Provide education about proper nutrition, including hydration, and aerobic/resistive exercise prescription along with prescribed medications to achieve blood glucose in normal ranges: Fasting glucose 65-99 mg/dL   Expected Outcomes Short Term: Participant verbalizes understanding of the signs/symptoms and immediate care of hyper/hypoglycemia, proper foot care and importance of medication, aerobic/resistive exercise and nutrition plan for blood glucose control.;Long Term: Attainment of HbA1C < 7%.      Core Components/Risk Factors/Patient Goals Review:      Goals and Risk Factor Review      09/06/15 1811 09/06/15 1817         Core Components/Risk Factors/Patient Goals Review   Personal Goals Review Weight Management/Obesity       Review Weight still 209lbs, blood sugars 91 this  am and doing well.  C/o of shortness of breath with PVC's today when he was exercising on the recumbent bike. "[name]" [name] will show it to Dr. Ubaldo Harmon at his appt in the AM.       Expected Outcomes To not increase his weight above 209lbs, cont to have good blood sugars Normal Sinus rythym         Core Components/Risk Factors/Patient Goals at Discharge (Final Review):      Goals and Risk Factor Review - 09/06/15 1817    Core Components/Risk Factors/Patient Goals Review   Review C/o of shortness of breath with PVC's today when he was exercising on the recumbent bike. "[name]" Nils will show it to Dr. Ubaldo Harmon at his appt in the AM.    Expected Outcomes Normal Sinus rythym      ITP Comments:     ITP Comments      06/29/15 1351 06/29/15 1548 07/30/15 1045 08/23/15 1718 08/27/15 1102   ITP Comments " [name]" [name] keeps very detailed records of his blood sugars which fasting have been 90s or high 80s. He checks his blood pressure every day and it has been up a little 140s so MD started him on new medicine. See med list.   30 Day Review. Continue with the ITP.  New to program 30 Day review. Continue with ITP [name] said he met with the Cardiac [facility] Registered Dietician but he felt he already knew the info but he said it is always good to get it reinforced. [name] said he used to bike 40 miles a day somedays and can only bike outside 5 miles and wishes he has more energy. [name] has increased on the REcumbent Bike and sweats each time he exercises. [name] said wen he finishes CR he will go back to hiking, riding his bike outside. 30 day review. Continue with ITP     09/06/15 1815           ITP Comments C/o of shortness of breath with PVC's today when he was exercising on the recumbent bike. "[name]" [name] will show it to Dr. Ubaldo Harmon at his appt in the AM.           Comments: Completed 36/36 sessions

## 2015-09-06 NOTE — Addendum Note (Signed)
Addended by: Gerlene Burdock on: 09/06/2015 06:23 PM   Modules accepted: Orders

## 2015-09-07 DIAGNOSIS — I2510 Atherosclerotic heart disease of native coronary artery without angina pectoris: Secondary | ICD-10-CM | POA: Diagnosis not present

## 2015-09-07 DIAGNOSIS — I10 Essential (primary) hypertension: Secondary | ICD-10-CM | POA: Diagnosis not present

## 2015-09-07 DIAGNOSIS — I480 Paroxysmal atrial fibrillation: Secondary | ICD-10-CM | POA: Diagnosis not present

## 2015-09-07 DIAGNOSIS — I25810 Atherosclerosis of coronary artery bypass graft(s) without angina pectoris: Secondary | ICD-10-CM | POA: Diagnosis not present

## 2015-09-07 DIAGNOSIS — E1159 Type 2 diabetes mellitus with other circulatory complications: Secondary | ICD-10-CM | POA: Diagnosis not present

## 2015-09-13 ENCOUNTER — Other Ambulatory Visit: Payer: Self-pay | Admitting: Family Medicine

## 2015-09-27 ENCOUNTER — Encounter: Payer: Self-pay | Admitting: *Deleted

## 2015-09-27 DIAGNOSIS — Z951 Presence of aortocoronary bypass graft: Secondary | ICD-10-CM

## 2015-09-27 NOTE — Progress Notes (Signed)
Cardiac Individual Treatment Plan  Patient Details  Name: [name] MRN: [id number] Date of Birth: [birth year] Referring Provider:    Initial Encounter Date:       Cardiac Rehab from 06/29/2015 in [facility] Cardiac and Pulmonary Rehab   Date  06/29/15      Visit Diagnosis: S/P CABG x 4  Patient's Home Medications on Admission:  Current outpatient prescriptions:  .  amiodarone (PACERONE) 200 MG tablet, Take 1 tablet (200 mg total) by mouth 2 (two) times daily. For 10 Days, then decrease to 200 mg daily, Disp: 60 tablet, Rfl: 3 .  aspirin 81 MG tablet, Take 81 mg by mouth daily., Disp: , Rfl:  .  atorvastatin (LIPITOR) 40 MG tablet, Take 1 tablet (40 mg total) by mouth daily., Disp: 30 tablet, Rfl: 6 .  folic acid (FOLVITE) 1 MG tablet, Take 1 tablet (1 mg total) by mouth daily. (Patient not taking: Reported on 06/29/2015), Disp: 30 tablet, Rfl: 0 .  glipiZIDE (GLUCOTROL) 5 MG tablet, TAKE 1 TABLET (5 MG TOTAL) BY MOUTH 2 (TWO) TIMES DAILY., Disp: 60 tablet, Rfl: 0 .  glucose blood (FREESTYLE TEST STRIPS) test strip, 1 each by Other route daily. Use as instructed- E11.9, Disp: 50 each, Rfl: 0 .  losartan (COZAAR) 50 MG tablet, Take by mouth., Disp: , Rfl:  .  metFORMIN (GLUCOPHAGE) 1000 MG tablet, TAKE 1 TABLET (1,000 MG TOTAL) BY MOUTH 2 (TWO) TIMES DAILY., Disp: 60 tablet, Rfl: 0 .  metFORMIN (GLUCOPHAGE) 500 MG tablet, Take 500 mg by mouth 2 (two) times daily with a meal., Disp: , Rfl:  .  metoprolol tartrate (LOPRESSOR) 25 MG tablet, Take 0.5 tablets (12.5 mg total) by mouth 2 (two) times daily., Disp: 60 tablet, Rfl: 6 .  VIAGRA 50 MG tablet, TAKE 1 TABLET BY MOUTH AS NEEDED, Disp: 7 tablet, Rfl: 0  Past Medical History: Past Medical History  Diagnosis Date  . Hypertension   . Coronary artery disease   . Hyperlipidemia   . Type II diabetes mellitus (HCC)     Tobacco Use: History  Smoking status  . Never Smoker   Smokeless tobacco  . Never Used    Labs: Recent Review  Flowsheet Data    Labs for ITP Cardiac and Pulmonary Rehab Latest Ref Rng 04/21/2015 04/21/2015 04/21/2015 04/21/2015 04/22/2015   PHART 7.350 - 7.450 7.392 7.337(L) 7.286(L) - -   PCO2ART 35.0 - 45.0 mmHg 38.5 42.5 46.2(H) - -   HCO3 20.0 - 24.0 mEq/L 23.5 22.8 22.1 - -   TCO2 0 - 100 mmol/L _0 ACIDBASEDEF 0.0 - 2.0 mmol/L 1.0 3.0(H) 5.0(H) - -   O2SAT - 94.0 97.0 98.0 - -       Exercise Target Goals:    Exercise Program Goal: Individual exercise prescription set with THRR, safety & activity barriers. Participant demonstrates ability to understand and report RPE using BORG scale, to self-measure pulse accurately, and to acknowledge the importance of the exercise prescription.  Exercise Prescription Goal: Starting with aerobic activity 30 plus minutes a day, 3 days per week for initial exercise prescription. Provide home exercise prescription and guidelines that participant acknowledges understanding prior to discharge.  Activity Barriers & Risk Stratification:     Activity Barriers & Cardiac Risk Stratification - 06/29/15 1242    Activity Barriers & Cardiac Risk Stratification   Activity Barriers None   Cardiac Risk Stratification High      6 Minute Walk:  6 Minute Walk      06/29/15 1301 08/24/15 1641     6 Minute Walk   Phase Initial Discharge    Distance 1495 feet 1718 feet    Walk Time 6 minutes 6 minutes    # of Rest Breaks 0     RPE 11 11    Symptoms No No    Resting HR 60 bpm 57 bpm    Resting BP 132/80 mmHg 142/80 mmHg    Max Ex. HR 90 bpm 80 bpm    Max Ex. BP 128/74 mmHg 142/82 mmHg       Initial Exercise Prescription:     Initial Exercise Prescription - 06/29/15 1300    Date of Initial Exercise RX and Referring Provider   Date 06/29/15   Treadmill   MPH 3   Grade 0   Minutes 10   Recumbant Bike   Level 3   RPM 40   Watts 20   NuStep   Level 2   Watts 40   Minutes 10   Arm Ergometer   Level 1   Watts 10   Minutes 10    Recumbant Elliptical   Level 2   RPM 40   Watts 20   Minutes 10   REL-XR   Level 3   Watts 50   Minutes 10   T5 Nustep   Level 1   Watts 20   Minutes 10   Biostep-RELP   Level 2   Watts 40   Minutes 10   Prescription Details   Frequency (times per week) 3   Duration Progress to 45 minutes of aerobic exercise without signs/symptoms of physical distress   Intensity   THRR REST +  30   Ratings of Perceived Exertion 11-15   Perceived Dyspnea 0-4   Progression   Progression Continue progressive overload as per policy without signs/symptoms or physical distress.   Resistance Training   Training Prescription Yes   Weight 2   Reps 10-15      Perform Capillary Blood Glucose checks as needed.  Exercise Prescription Changes:     Exercise Prescription Changes      07/17/15 1600 07/20/15 1600 07/26/15 1600 07/27/15 1452 08/03/15 1700   Exercise Review   Progression _0    Response to Exercise   Blood Pressure (Admit)    154/76 mmHg    Blood Pressure (Exercise)    150/62 mmHg    Blood Pressure (Exit)    144/62 mmHg    Heart Rate (Admit)    63 bpm    Heart Rate (Exercise)    109 bpm    Heart Rate (Exit)    61 bpm    Rating of Perceived Exertion (Exercise)    14    Symptoms    No    Duration    Progress to 50 minutes of aerobic without signs/symptoms of physical distress Progress to 50 minutes of aerobic without signs/symptoms of physical distress   Intensity    Rest + 30 Rest + 30   Resistance Training   Training Prescription _1    Weight _2 Reps 10-15 10-15 10-15 10-15 10-15   Interval Training   Interval Training    Yes Yes   Equipment    Recumbant Bike Recumbant Bike   Comments    --  Level 7-Level 8 --  Level 7-Level 8   Treadmill   MPH  _0 Grade _1 Minutes _2 Recumbant Bike   Level _3 RPM 40 40 40     Watts _4 95 95   NuStep   Level _5 Watts 40 40 40     Minutes  _6 Arm Ergometer   Level _7 Watts _8 Minutes _9 Recumbant Elliptical   Level _10 RPM 40 40 40     Watts _11 Minutes _12 REL-XR   Level _13 Watts 50 50 50     Minutes _14 T5 Nustep   Level _15 Watts _16 Minutes _17 Biostep-RELP   Level _18 Watts 40 40 40     Minutes _19 08/10/15 1700 08/22/15 1300 08/23/15 1700 08/30/15 1600     Exercise Review   Progression Yes Yes Yes Yes    Response to Exercise   Blood Pressure (Admit)  140/84 mmHg 140/84 mmHg     Blood Pressure (Exercise)  144/74 mmHg 144/74 mmHg     Blood Pressure (Exit)  124/68 mmHg 124/68 mmHg     Heart Rate (Admit)  57 bpm 57 bpm     Heart Rate (Exercise)  90 bpm 90 bpm     Heart Rate (Exit)  62 bpm 62 bpm     Rating of Perceived Exertion (Exercise)  12 12     Symptoms  No No     Duration Progress to 50 minutes of aerobic without signs/symptoms of physical distress Progress to 50 minutes of aerobic without signs/symptoms of physical distress Progress to 50 minutes of aerobic without signs/symptoms of physical distress Progress to 50 minutes of aerobic without signs/symptoms of physical distress    Intensity Rest + 30 Rest + 30 Rest + 30 THRR New  THRR 40 - 80%  60-121    Resistance Training   Training Prescription Yes Yes Yes Yes    Weight _20 Reps 10-15 10-15 10-15 10-15    Interval Training   Interval Training Yes Yes Yes Yes    Equipment Recumbant Bike Recumbant Bike Recumbant Bike Recumbant Bike    Comments --  Level 7-Level 8 --  Level 7-Level 8 --  Level 7-Level 8 --  Level 7-Level 8    Treadmill   MPH _21 Grade _22 Minutes _23 Recumbant Bike   Level _24 RPM 40 40 40 40    Watts 95 95 95 95    Home Exercise Plan   Plans to continue exercise at   Home Home    Frequency   --  [name] hopes to get back to riding his bike 91m/day outside.  --   Tim hopes to get back to riding his bike 440mday outside.  Exercise Comments:     Exercise Comments      07/17/15 1626 07/28/15 1457 08/22/15 1333 09/06/15 1816     Exercise Comments Reviewed individualized exercise prescription and made increases per departmental policy. Exercise increases were discussed with the patient and they were able to perform the new work loads without issue (no signs or symptoms).  [name] has been attending Heart Track regularly and making progress in tolerance to exercise in both duration and intensity.  He is able to exercise successfully without complications.  Goal will be to continue to progress in Heart Track per program policy [name] has continued to progress in Heart Track and has been tolerating Interval Training well with no difficulties.  He is able to utilize exercise equipment with minimal set-up and should be able to continue exercise in a community setting upon completion of cardiac rehab.  Goal will be to determine follow up exercise plan and assist in implementing it. C/o of shortness of breath with PVC's today when he was exercising on the recumbent bike. "Tim" Zev will show it to Dr. Ubaldo Glassing at his appt in the AM.        Discharge Exercise Prescription (Final Exercise Prescription Changes):     Exercise Prescription Changes - 08/30/15 1600    Exercise Review   Progression Yes   Response to Exercise   Duration Progress to 50 minutes of aerobic without signs/symptoms of physical distress   Intensity THRR New  THRR 40 - 80%  60-121   Resistance Training   Training Prescription Yes   Weight 5   Reps 10-15   Interval Training   Interval Training Yes   Equipment Recumbant Bike   Comments --  Level 7-Level 8   Treadmill   MPH 3   Grade 6   Minutes 15   Recumbant Bike   Level 8   RPM 40   Watts 95   Home Exercise Plan   Plans to continue exercise at Home   Frequency --  [name] hopes to get back to riding his bike 44m/day outside.        Nutrition:  Target Goals: Understanding of nutrition guidelines, daily intake of sodium <15034m cholesterol <20013mcalories 30% from fat and 7% or less from saturated fats, daily to have 5 or more servings of fruits and vegetables.  Biometrics:     Pre Biometrics - 06/29/15 1302    Pre Biometrics   Height 5' 10.1" (1.781 m)   Weight 209 lb 3.2 oz (94.892 kg)   Waist Circumference 42 inches   Hip Circumference 41 inches   Waist to Hip Ratio 1.02 %   BMI (Calculated) 30         Post Biometrics - 08/24/15 1649     Post  Biometrics   Weight 209 lb 9.6 oz (95.074 kg)   Waist Circumference 41.5 inches   Hip Circumference 41.2 inches   Waist to Hip Ratio 1.01 %      Nutrition Therapy Plan and Nutrition Goals:     Nutrition Therapy & Goals - 07/06/15 1740    Nutrition Therapy   Diet 1900kcal Diabetic with DASH guidelines   Drug/Food Interactions Statins/Certain Fruits   Protein (specify units) 8oz   Fiber 30 grams   Whole Grain Foods 3 servings   Saturated Fats 14 max. grams   Fruits and Vegetables 5 servings/day   Sodium 1500 grams   Personal Nutrition Goals   Personal Goal #1 Continue with current healthy food choices.  Comments Patient reports having worked on food portions since his surgery. He feels he is following a heart healthy pattern.    Intervention Plan   Intervention Nutrition handout(s) given to patient.;Prescribe, educate and counsel regarding individualized specific dietary modifications aiming towards targeted core components such as weight, hypertension, lipid management, diabetes, heart failure and other comorbidities.   Expected Outcomes Short Term Goal: A plan has been developed with personal nutrition goals set during dietitian appointment.      Nutrition Discharge: Rate Your Plate Scores:     Nutrition Assessments - 08/24/15 1647    Rate Your Plate Scores   Post Score 67   Post Score % 74.4 %      Nutrition Goals  Re-Evaluation:   Psychosocial: Target Goals: Acknowledge presence or absence of depression, maximize coping skills, provide positive support system. Participant is able to verbalize types and ability to use techniques and skills needed for reducing stress and depression.  Initial Review & Psychosocial Screening:     Initial Psych Review & Screening - 06/29/15 [place]? Yes   Barriers   Psychosocial barriers to participate in program There are no identifiable barriers or psychosocial needs.   Screening Interventions   Interventions Encouraged to exercise      Quality of Life Scores:     Quality of Life - 08/24/15 1743    Quality of Life Scores   Health/Function Pre 21.33 %   Health/Function Post 22.4 %   Health/Function % Change 5.02 %   Socioeconomic Pre 22.5 %   Socioeconomic Post 22.5 %   Socioeconomic % Change  0 %   Psych/Spiritual Pre 21.71 %   Psych/Spiritual Post 22.43 %   Psych/Spiritual % Change 3.32 %   Family Pre 22.5 %   Family Post 21.4 %   Family % Change -4.89 %   GLOBAL Pre 21.82 %   GLOBAL Post 22.28 %   GLOBAL % Change 2.11 %      PHQ-9:     Recent Review Flowsheet Data    Depression screen [facility] 2/9 08/24/2015 06/29/2015 12/01/2014   Decreased Interest 0 0 0   Down, Depressed, Hopeless 0 0 0   PHQ - 2 Score 0 0 0   Altered sleeping 0 0 -   Tired, decreased energy 1 1 -   Change in appetite 0 1 -   Feeling bad or failure about yourself  0 0 -   Trouble concentrating 0 0 -   Moving slowly or fidgety/restless 0 0 -   Suicidal thoughts 0 0 -   PHQ-9 Score 1 2 -   Difficult doing work/chores Not difficult at all Not difficult at all -      Psychosocial Evaluation and Intervention:     Psychosocial Evaluation - 07/03/15 1701    Psychosocial Evaluation & Interventions   Interventions Encouraged to exercise with the program and follow exercise prescription   Comments counselor met with Mr. Lemelin for  initial psychosocial evaluation.  He is a 70 year old retired gentleman who had quadruple bypass surgery on 04/21/15.  He has a strong support system with a spouse of 66 years and an adult son who lives in the home.  Mr. Keimig has diabetes also, but states he sleeps well and has a good appetite.  He denies a history of depression or anxiety or any current symptoms.  Mr. Banh states he is typically in a positive mood and  has minimal stress in his life.  He has goals for increasing his stamina so he can return to his normal activities such as participating in a hiking group and bike riding.  Counselor encouraged him to exercise consistently since those are outdoor activities and can be subject to the weather.  Mr. Baumgardner reports also being a member of the [employer] and participating in [employer] if needed.        Psychosocial Re-Evaluation:     Psychosocial Re-Evaluation      09/06/15 1701           Psychosocial Re-Evaluation   Comments Counselor follow up with Mr. Hyser reporting he is scheduled to "graduate" from this program soon.  He reports some positive benefits with feeling stronger and able to return to most of his biking and hiking groups.  Mr. Quam states he is "a little disappointed" in his lack of stamina and is to see the Doctor this Thursday to determine if it is a medication issue.  Mr. Juhasz denies current symptoms of anxiety or depression and states he is typically in a positive mood, not letting much stress him out.  He plans to consistently exercise by continuing to bike or hike with the "meetups" groups that he participates in that meet regularly.  Counselor commended Mr. Siever on his hard work and commitment to consistent exercise, which he says he learned the value of in this program.            Vocational Rehabilitation: Provide vocational rehab assistance to qualifying candidates.   Vocational Rehab Evaluation & Intervention:     Vocational Rehab -  06/29/15 1242    Initial Vocational Rehab Evaluation & Intervention   Assessment shows need for Vocational Rehabilitation No      Education: Education Goals: Education classes will be provided on a weekly basis, covering required topics. Participant will state understanding/return demonstration of topics presented.  Learning Barriers/Preferences:     Learning Barriers/Preferences - 06/29/15 1242    Learning Barriers/Preferences   Learning Barriers None   Learning Preferences None      Education Topics: General Nutrition Guidelines/Fats and Fiber: -Group instruction provided by verbal, written material, models and posters to present the general guidelines for heart healthy nutrition. Gives an explanation and review of dietary fats and fiber.          Cardiac Rehab from 09/06/2015 in [facility] Cardiac and Pulmonary Rehab   Date  07/10/15   Educator  PI   Instruction Review Code  2- meets goals/outcomes      Controlling Sodium/Reading Food Labels: -Group verbal and written material supporting the discussion of sodium use in heart healthy nutrition. Review and explanation with models, verbal and written materials for utilization of the food label.      Cardiac Rehab from 09/06/2015 in [facility] Cardiac and Pulmonary Rehab   Date  07/17/15   Educator  PI   Instruction Review Code  2- meets goals/outcomes      Exercise Physiology & Risk Factors: - Group verbal and written instruction with models to review the exercise physiology of the cardiovascular system and associated critical values. Details cardiovascular disease risk factors and the goals associated with each risk factor.      Cardiac Rehab from 09/06/2015 in [facility] Cardiac and Pulmonary Rehab   Date  07/26/15   Educator  Salley Hews, PT   Instruction Review Code  2- meets goals/outcomes      Aerobic Exercise & Resistance Training: - Krystal Clark  group verbal and written discussion on the health impact of inactivity. On the components  of aerobic and resistive training programs and the benefits of this training and how to safely progress through these programs.      Cardiac Rehab from 09/06/2015 in [facility] Cardiac and Pulmonary Rehab   Date  07/31/15   Educator  BS   Instruction Review Code  2- meets goals/outcomes      Flexibility, Balance, General Exercise Guidelines: - Provides group verbal and written instruction on the benefits of flexibility and balance training programs. Provides general exercise guidelines with specific guidelines to those with heart or lung disease. Demonstration and skill practice provided.   Stress Management: - Provides group verbal and written instruction about the health risks of elevated stress, cause of high stress, and healthy ways to reduce stress.      Cardiac Rehab from 09/06/2015 in [facility] Cardiac and Pulmonary Rehab   Date  08/09/15   Educator  Berle Mull, MSW   Instruction Review Code  2- meets goals/outcomes      Depression: - Provides group verbal and written instruction on the correlation between heart/lung disease and depressed mood, treatment options, and the stigmas associated with seeking treatment.      Cardiac Rehab from 09/06/2015 in [facility] Cardiac and Pulmonary Rehab   Date  09/06/15   Educator  Kathreen Cornfield, [facility]   Instruction Review Code  2- meets goals/outcomes      Anatomy & Physiology of the Heart: - Group verbal and written instruction and models provide basic cardiac anatomy and physiology, with the coronary electrical and arterial systems. Review of: AMI, Angina, Valve disease, Heart Failure, Cardiac Arrhythmia, Pacemakers, and the ICD.   Cardiac Procedures: - Group verbal and written instruction and models to describe the testing methods done to diagnose heart disease. Reviews the outcomes of the test results. Describes the treatment choices: Medical Management, Angioplasty, or Coronary Bypass Surgery.      Cardiac Rehab from 09/06/2015 in [facility] Cardiac and  Pulmonary Rehab   Date  08/14/15   Educator  SB   Instruction Review Code  2- meets goals/outcomes      Cardiac Medications: - Group verbal and written instruction to review commonly prescribed medications for heart disease. Reviews the medication, class of the drug, and side effects. Includes the steps to properly store meds and maintain the prescription regimen.      Cardiac Rehab from 09/06/2015 in [facility] Cardiac and Pulmonary Rehab   Date  08/23/15   Educator  DW   Instruction Review Code  2- meets goals/outcomes      Go Sex-Intimacy & Heart Disease, Get SMART - Goal Setting: - Group verbal and written instruction through game format to discuss heart disease and the return to sexual intimacy. Provides group verbal and written material to discuss and apply goal setting through the application of the S.M.A.R.T. Method.      Cardiac Rehab from 09/06/2015 in [facility] Cardiac and Pulmonary Rehab   Date  08/14/15   Educator  SB   Instruction Review Code  2- meets goals/outcomes      Other Matters of the Heart: - Provides group verbal, written materials and models to describe Heart Failure, Angina, Valve Disease, and Diabetes in the realm of heart disease. Includes description of the disease process and treatment options available to the cardiac patient.      Cardiac Rehab from 09/06/2015 in [facility] Cardiac and Pulmonary Rehab   Date  08/07/15   Educator  SB   Instruction Review Code  2- meets goals/outcomes      Exercise & Equipment Safety: - Individual verbal instruction and demonstration of equipment use and safety with use of the equipment.      Cardiac Rehab from 09/06/2015 in [facility] Cardiac and Pulmonary Rehab   Date  06/29/15   Educator  C. EnterkinRN   Instruction Review Code  1- partially meets, needs review/practice      Infection Prevention: - Provides verbal and written material to individual with discussion of infection control including proper hand washing and proper equipment  cleaning during exercise session.      Cardiac Rehab from 09/06/2015 in [facility] Cardiac and Pulmonary Rehab   Date  06/29/15   Educator  C. EnterkinRN   Instruction Review Code  2- meets goals/outcomes      Falls Prevention: - Provides verbal and written material to individual with discussion of falls prevention and safety.      Cardiac Rehab from 09/06/2015 in [facility] Cardiac and Pulmonary Rehab   Date  06/29/15   Educator  C. [place]   Instruction Review Code  2- meets goals/outcomes      Diabetes: - Individual verbal and written instruction to review signs/symptoms of diabetes, desired ranges of glucose level fasting, after meals and with exercise. Advice that pre and post exercise glucose checks will be done for 3 sessions at entry of program.      Cardiac Rehab from 09/06/2015 in [facility] Cardiac and Pulmonary Rehab   Date  06/29/15   Educator  C. [place]   Instruction Review Code  2- meets goals/outcomes       Knowledge Questionnaire Score:     Knowledge Questionnaire Score - 08/24/15 1651    Knowledge Questionnaire Score   Post Score 23      Core Components/Risk Factors/Patient Goals at Admission:     Personal Goals and Risk Factors at Admission - 06/29/15 1347    Core Components/Risk Factors/Patient Goals on Admission    Weight Management Yes   Intervention Weight Management: Develop a combined nutrition and exercise program designed to reach desired caloric intake, while maintaining appropriate intake of nutrient and fiber, sodium and fats, and appropriate energy expenditure required for the weight goal.;Weight Management/Obesity: Establish reasonable short term and long term weight goals.;Obesity: Provide education and appropriate resources to help participant work on and attain dietary goals.   Admit Weight 209 lb 3.2 oz (94.892 kg)   Goal Weight: Short Term 205 lb (92.987 kg)   Goal Weight: Long Term 190 lb (86.183 kg)   Diabetes Yes   Intervention Provide  education about signs/symptoms and action to take for hypo/hyperglycemia.;Provide education about proper nutrition, including hydration, and aerobic/resistive exercise prescription along with prescribed medications to achieve blood glucose in normal ranges: Fasting glucose 65-99 mg/dL   Expected Outcomes Short Term: Participant verbalizes understanding of the signs/symptoms and immediate care of hyper/hypoglycemia, proper foot care and importance of medication, aerobic/resistive exercise and nutrition plan for blood glucose control.;Long Term: Attainment of HbA1C < 7%.      Core Components/Risk Factors/Patient Goals Review:      Goals and Risk Factor Review      09/06/15 1811 09/06/15 1817         Core Components/Risk Factors/Patient Goals Review   Personal Goals Review Weight Management/Obesity       Review Weight still 209lbs, blood sugars 91 this am and doing well.  C/o of shortness of breath with PVC's today when he was  exercising on the recumbent bike. "Tim" Aldahir will show it to Dr. Ubaldo Glassing at his appt in the AM.       Expected Outcomes To not increase his weight above 209lbs, cont to have good blood sugars Normal Sinus rythym         Core Components/Risk Factors/Patient Goals at Discharge (Final Review):      Goals and Risk Factor Review - 09/06/15 1817    Core Components/Risk Factors/Patient Goals Review   Review C/o of shortness of breath with PVC's today when he was exercising on the recumbent bike. "Tim" Catarino will show it to Dr. Ubaldo Glassing at his appt in the AM.    Expected Outcomes Normal Sinus rythym      ITP Comments:     ITP Comments      06/29/15 1351 06/29/15 1548 07/30/15 1045 08/23/15 1718 08/27/15 1102   ITP Comments " Tim" Vaughn keeps very detailed records of his blood sugars which fasting have been 90s or high 80s. He checks his blood pressure every day and it has been up a little 140s so MD started him on new medicine. See med list.  30 Day Review. Continue  with the ITP.  New to program 30 Day review. Continue with ITP [name] said he met with the Cardiac Rehab Registered Dietician but he felt he already knew the info but he said it is always good to get it reinforced. Tim said he used to bike 40 miles a day somedays and can only bike outside 5 miles and wishes he has more energy. Tim has increased on the REcumbent Bike and sweats each time he exercises. Tim said wen he finishes CR he will go back to hiking, riding his bike outside. 30 day review. Continue with ITP     09/06/15 1815 09/27/15 0903         ITP Comments C/o of shortness of breath with PVC's today when he was exercising on the recumbent bike. "Tim" Suzanne will show it to Dr. Ubaldo Glassing at his appt in the AM.  discharged  30 day review.           Comments:

## 2015-10-02 ENCOUNTER — Other Ambulatory Visit: Payer: Self-pay | Admitting: Family Medicine

## 2015-10-17 ENCOUNTER — Other Ambulatory Visit: Payer: Self-pay

## 2015-10-25 DIAGNOSIS — I10 Essential (primary) hypertension: Secondary | ICD-10-CM | POA: Diagnosis not present

## 2015-10-25 DIAGNOSIS — I481 Persistent atrial fibrillation: Secondary | ICD-10-CM | POA: Diagnosis not present

## 2015-10-25 DIAGNOSIS — I25810 Atherosclerosis of coronary artery bypass graft(s) without angina pectoris: Secondary | ICD-10-CM | POA: Diagnosis not present

## 2015-10-25 DIAGNOSIS — E782 Mixed hyperlipidemia: Secondary | ICD-10-CM | POA: Diagnosis not present

## 2015-10-25 DIAGNOSIS — I2510 Atherosclerotic heart disease of native coronary artery without angina pectoris: Secondary | ICD-10-CM | POA: Diagnosis not present

## 2015-11-03 ENCOUNTER — Encounter: Payer: Self-pay | Admitting: Family Medicine

## 2015-11-03 ENCOUNTER — Ambulatory Visit (INDEPENDENT_AMBULATORY_CARE_PROVIDER_SITE_OTHER): Payer: Medicare Other | Admitting: Family Medicine

## 2015-11-03 VITALS — BP 120/70 | HR 60 | Ht 70.0 in | Wt 212.0 lb

## 2015-11-03 DIAGNOSIS — I2510 Atherosclerotic heart disease of native coronary artery without angina pectoris: Secondary | ICD-10-CM | POA: Diagnosis not present

## 2015-11-03 DIAGNOSIS — I10 Essential (primary) hypertension: Secondary | ICD-10-CM

## 2015-11-03 DIAGNOSIS — I25810 Atherosclerosis of coronary artery bypass graft(s) without angina pectoris: Secondary | ICD-10-CM

## 2015-11-03 DIAGNOSIS — E1159 Type 2 diabetes mellitus with other circulatory complications: Secondary | ICD-10-CM

## 2015-11-03 DIAGNOSIS — E785 Hyperlipidemia, unspecified: Secondary | ICD-10-CM | POA: Diagnosis not present

## 2015-11-03 MED ORDER — METFORMIN HCL 500 MG PO TABS: 500.0000 mg | ORAL_TABLET | Freq: Two times a day (BID) | ORAL | 1 refills | Status: DC

## 2015-11-03 MED ORDER — GLIPIZIDE 5 MG PO TABS: 5.0000 mg | ORAL_TABLET | Freq: Two times a day (BID) | ORAL | 1 refills | Status: DC

## 2015-11-03 MED ORDER — METOPROLOL TARTRATE 25 MG PO TABS: 12.5000 mg | ORAL_TABLET | Freq: Two times a day (BID) | ORAL | 1 refills | Status: DC

## 2015-11-03 MED ORDER — LOSARTAN POTASSIUM 50 MG PO TABS: 50.0000 mg | ORAL_TABLET | Freq: Every day | ORAL | 1 refills | Status: DC

## 2015-11-03 MED ORDER — ATORVASTATIN CALCIUM 40 MG PO TABS: 40.0000 mg | ORAL_TABLET | Freq: Every day | ORAL | 1 refills | Status: DC

## 2015-11-03 NOTE — Progress Notes (Signed)
Name: [name]   MRN: [id number]    DOB: 29-Mar-1946   Date:11/03/2015       Progress Note  Subjective  Chief Complaint  Chief Complaint  Patient presents with  . Hypertension  . Hyperlipidemia  . Diabetes    Hypertension  This is a chronic problem. The current episode started more than 1 year ago. The problem has been waxing and waning since onset. The problem is controlled. Pertinent negatives include no anxiety, blurred vision, chest pain, headaches, malaise/fatigue, neck pain, orthopnea, palpitations, peripheral edema, PND, shortness of breath or sweats. There are no associated agents to hypertension. There are no known risk factors for coronary artery disease. Past treatments include angiotensin blockers and beta blockers. The current treatment provides mild improvement. There are no compliance problems.  There is no history of angina, kidney disease, CAD/MI, CVA, heart failure, left ventricular hypertrophy, PVD, renovascular disease or retinopathy. There is no history of chronic renal disease or a hypertension causing med.  Hyperlipidemia  This is a chronic problem. The current episode started more than 1 year ago. The problem is controlled. Exacerbating diseases include diabetes. He has no history of chronic renal disease, hypothyroidism, liver disease, obesity or nephrotic syndrome. Factors aggravating his hyperlipidemia include beta blockers. Pertinent negatives include no chest pain, focal weakness, myalgias or shortness of breath. The current treatment provides moderate improvement of lipids. There are no compliance problems.   Diabetes  He presents for his follow-up diabetic visit. He has type 2 diabetes mellitus. His disease course has been improving. There are no hypoglycemic associated symptoms. Pertinent negatives for hypoglycemia include no dizziness, headaches, nervousness/anxiousness or sweats. Pertinent negatives for diabetes include no blurred vision, no chest pain, no  fatigue, no foot paresthesias, no foot ulcerations, no polydipsia, no polyphagia, no polyuria, no visual change, no weakness and no weight loss. There are no hypoglycemic complications. Symptoms are improving. There are no diabetic complications. Pertinent negatives for diabetic complications include no CVA, PVD or retinopathy. There are no known risk factors for coronary artery disease. Current diabetic treatment includes oral agent (dual therapy). He is compliant with treatment all of the time.    No problem-specific Assessment & Plan notes found for this encounter.   Past Medical History:  Diagnosis Date  . Coronary artery disease   . Hyperlipidemia   . Hypertension   . Type II diabetes mellitus ([place])     Past Surgical History:  Procedure Laterality Date  . CARDIAC CATHETERIZATION N/A 04/18/2015   Procedure: Left Heart Cath and Coronary Angiography;  Surgeon: Yolonda Kida, MD;  Location: [facility];  Service: Cardiovascular;  Laterality: N/A;  . COLONOSCOPY  2014   cleared for 10 yrs  . CORONARY ARTERY BYPASS GRAFT N/A 04/21/2015   Procedure: CORONARY ARTERY BYPASS GRAFTING (CABG) X 4 UTILIZING THE LEFT INTERNAL MAMMARY ARTERY TO LAD, ENDOSCOPICALLY HARVESTED RIGHT SAPHENEOUS VEINS TO PD,DIAGONAL AND CIRCUMFLEX.;  Surgeon: Grace Isaac, MD;  Location: [place] [place];  Service: Open Heart Surgery;  Laterality: N/A;  . DUPUYTREN CONTRACTURE RELEASE Left ~ 2000  . KNEE ARTHROSCOPY Right ~ 1995  . TEE WITHOUT CARDIOVERSION N/A 04/21/2015   Procedure: TRANSESOPHAGEAL ECHOCARDIOGRAM (TEE);  Surgeon: Grace Isaac, MD;  Location: [place];  Service: Open Heart Surgery;  Laterality: N/A;    Family History  Problem Relation Age of Onset  . Diabetes Mother   . Heart attack    . Hypertension      Social History   Social  History  . Marital status: Married    Spouse name: N/A  . Number of children: N/A  . Years of education: N/A   Occupational History  . Not on file.    Social History Main Topics  . Smoking status: Never Smoker  . Smokeless tobacco: Never Used  . Alcohol use 0.0 oz/week     Comment: 04/19/2015 "might have a drink a couple times/yr; at most"  . Drug use: No  . Sexual activity: Not Currently   Other Topics Concern  . Not on file   Social History Narrative  . No narrative on file    No Known Allergies   Review of Systems  Constitutional: Negative for chills, fatigue, fever, malaise/fatigue and weight loss.  HENT: Negative for ear discharge, ear pain and sore throat.   Eyes: Negative for blurred vision.  Respiratory: Negative for cough, sputum production, shortness of breath and wheezing.   Cardiovascular: Negative for chest pain, palpitations, orthopnea, leg swelling and PND.  Gastrointestinal: Negative for abdominal pain, blood in stool, constipation, diarrhea, heartburn, melena and nausea.  Genitourinary: Negative for dysuria, frequency, hematuria and urgency.  Musculoskeletal: Negative for back pain, joint pain, myalgias and neck pain.  Skin: Negative for rash.  Neurological: Negative for dizziness, tingling, sensory change, focal weakness, weakness and headaches.  Endo/Heme/Allergies: Negative for environmental allergies, polydipsia and polyphagia. Does not bruise/bleed easily.  Psychiatric/Behavioral: Negative for depression and suicidal ideas. The patient is not nervous/anxious and does not have insomnia.      Objective  Vitals:   11/03/15 1337  BP: 120/70  Pulse: 60  Weight: 212 lb (96.2 kg)  Height: 5\' 10"  (1.778 m)    Physical Exam  Constitutional: He is oriented to person, place, and time and well-developed, well-nourished, and in no distress.  HENT:  Head: Normocephalic.  Right Ear: External ear normal.  Left Ear: External ear normal.  Nose: Nose normal.  Mouth/Throat: Oropharynx is clear and moist.  Eyes: Conjunctivae and EOM are normal. Pupils are equal, round, and reactive to light. Right eye  exhibits no discharge. Left eye exhibits no discharge. No scleral icterus.  Neck: Normal range of motion. Neck supple. No JVD present. No tracheal deviation present. No thyromegaly present.  Cardiovascular: Normal rate, regular rhythm, S1 normal, S2 normal, normal heart sounds, intact distal pulses and normal pulses.  Exam reveals no gallop, no S4 and no friction rub.   No murmur heard. Pulmonary/Chest: Breath sounds normal. No respiratory distress. He has no wheezes. He has no rales.  Abdominal: Soft. Bowel sounds are normal. He exhibits no mass. There is no hepatosplenomegaly. There is no tenderness. There is no rebound, no guarding and no CVA tenderness.  Musculoskeletal: Normal range of motion. He exhibits no edema or tenderness.  Lymphadenopathy:    He has no cervical adenopathy.  Neurological: He is alert and oriented to person, place, and time. He has normal sensation, normal strength, normal reflexes and intact cranial nerves. No cranial nerve deficit.  Skin: Skin is warm. No rash noted.  Psychiatric: Mood and affect normal.  Nursing note and vitals reviewed.     Assessment & Plan  Problem List Items Addressed This Visit      Cardiovascular and Mediastinum   HTN (hypertension) - Primary   Relevant Medications   atorvastatin (LIPITOR) 40 MG tablet   losartan (COZAAR) 50 MG tablet   metoprolol tartrate (LOPRESSOR) 25 MG tablet   Other Relevant Orders   Renal Function Panel   CAD (coronary artery  disease)   Relevant Medications   atorvastatin (LIPITOR) 40 MG tablet   losartan (COZAAR) 50 MG tablet   metoprolol tartrate (LOPRESSOR) 25 MG tablet     Endocrine   Type 2 diabetes mellitus (HCC)   Relevant Medications   atorvastatin (LIPITOR) 40 MG tablet   glipiZIDE (GLUCOTROL) 5 MG tablet   losartan (COZAAR) 50 MG tablet   metFORMIN (GLUCOPHAGE) 500 MG tablet   Other Relevant Orders   Hemoglobin A1c    Other Visit Diagnoses    Hyperlipidemia       Relevant Medications    atorvastatin (LIPITOR) 40 MG tablet   losartan (COZAAR) 50 MG tablet   metoprolol tartrate (LOPRESSOR) 25 MG tablet   Other Relevant Orders   Lipid Profile        Dr. Deanna Jones [facility]  11/03/15

## 2015-11-04 LAB — RENAL FUNCTION PANEL
Albumin: 4.5 g/dL (ref 3.5–4.8)
BUN / CREAT RATIO: 15 (ref 10–24)
BUN: 29 mg/dL — ABNORMAL HIGH (ref 8–27)
CALCIUM: 10.1 mg/dL (ref 8.6–10.2)
CHLORIDE: 100 mmol/L (ref 96–106)
CO2: 26 mmol/L (ref 18–29)
Creatinine, Ser: 1.9 mg/dL — ABNORMAL HIGH (ref 0.76–1.27)
GFR calc non Af Amer: 35 mL/min/{1.73_m2} — ABNORMAL LOW (ref >59)
GFR, EST AFRICAN AMERICAN: 40 mL/min/{1.73_m2} — AB (ref >59)
GLUCOSE: 135 mg/dL — AB (ref 65–99)
POTASSIUM: 5.5 mmol/L — AB (ref 3.5–5.2)
Phosphorus: 3.6 mg/dL (ref 2.5–4.5)
SODIUM: 140 mmol/L (ref 134–144)

## 2015-11-04 LAB — LIPID PANEL
CHOL/HDL RATIO: 3.2 ratio (ref 0.0–5.0)
CHOLESTEROL TOTAL: 111 mg/dL (ref 100–199)
HDL: 35 mg/dL — ABNORMAL LOW (ref >39)
LDL CALC: 54 mg/dL (ref 0–99)
Triglycerides: 109 mg/dL (ref 0–149)
VLDL Cholesterol Cal: 22 mg/dL (ref 5–40)

## 2015-11-04 LAB — HEMOGLOBIN A1C
Est. average glucose Bld gHb Est-mCnc: 154 mg/dL
Hgb A1c MFr Bld: 7 % — ABNORMAL HIGH (ref 4.8–5.6)

## 2015-11-06 ENCOUNTER — Other Ambulatory Visit: Payer: Self-pay

## 2015-11-06 DIAGNOSIS — R7989 Other specified abnormal findings of blood chemistry: Secondary | ICD-10-CM

## 2015-11-07 DIAGNOSIS — L570 Actinic keratosis: Secondary | ICD-10-CM | POA: Diagnosis not present

## 2015-11-07 DIAGNOSIS — I25810 Atherosclerosis of coronary artery bypass graft(s) without angina pectoris: Secondary | ICD-10-CM | POA: Diagnosis not present

## 2015-11-07 DIAGNOSIS — I2510 Atherosclerotic heart disease of native coronary artery without angina pectoris: Secondary | ICD-10-CM | POA: Diagnosis not present

## 2015-11-07 DIAGNOSIS — I10 Essential (primary) hypertension: Secondary | ICD-10-CM | POA: Diagnosis not present

## 2015-11-07 DIAGNOSIS — Z1283 Encounter for screening for malignant neoplasm of skin: Secondary | ICD-10-CM | POA: Diagnosis not present

## 2015-11-07 DIAGNOSIS — Z87898 Personal history of other specified conditions: Secondary | ICD-10-CM | POA: Diagnosis not present

## 2015-11-08 DIAGNOSIS — R000 Tachycardia, unspecified: Secondary | ICD-10-CM | POA: Diagnosis not present

## 2015-11-15 DIAGNOSIS — Z87898 Personal history of other specified conditions: Secondary | ICD-10-CM | POA: Diagnosis not present

## 2015-12-05 DIAGNOSIS — I25810 Atherosclerosis of coronary artery bypass graft(s) without angina pectoris: Secondary | ICD-10-CM | POA: Diagnosis not present

## 2015-12-05 DIAGNOSIS — I10 Essential (primary) hypertension: Secondary | ICD-10-CM | POA: Diagnosis not present

## 2015-12-05 DIAGNOSIS — I2510 Atherosclerotic heart disease of native coronary artery without angina pectoris: Secondary | ICD-10-CM | POA: Diagnosis not present

## 2016-01-08 DIAGNOSIS — R809 Proteinuria, unspecified: Secondary | ICD-10-CM | POA: Diagnosis not present

## 2016-01-08 DIAGNOSIS — N390 Urinary tract infection, site not specified: Secondary | ICD-10-CM | POA: Diagnosis not present

## 2016-01-08 DIAGNOSIS — N183 Chronic kidney disease, stage 3 (moderate): Secondary | ICD-10-CM | POA: Diagnosis not present

## 2016-01-08 DIAGNOSIS — E1122 Type 2 diabetes mellitus with diabetic chronic kidney disease: Secondary | ICD-10-CM | POA: Diagnosis not present

## 2016-01-08 DIAGNOSIS — I10 Essential (primary) hypertension: Secondary | ICD-10-CM | POA: Diagnosis not present

## 2016-01-15 DIAGNOSIS — L570 Actinic keratosis: Secondary | ICD-10-CM | POA: Diagnosis not present

## 2016-01-23 DIAGNOSIS — N183 Chronic kidney disease, stage 3 (moderate): Secondary | ICD-10-CM | POA: Diagnosis not present

## 2016-01-31 DIAGNOSIS — E119 Type 2 diabetes mellitus without complications: Secondary | ICD-10-CM | POA: Diagnosis not present

## 2016-01-31 DIAGNOSIS — H2513 Age-related nuclear cataract, bilateral: Secondary | ICD-10-CM | POA: Diagnosis not present

## 2016-02-05 ENCOUNTER — Other Ambulatory Visit: Payer: Self-pay | Admitting: Family Medicine

## 2016-02-09 DIAGNOSIS — E1122 Type 2 diabetes mellitus with diabetic chronic kidney disease: Secondary | ICD-10-CM | POA: Diagnosis not present

## 2016-02-09 DIAGNOSIS — R809 Proteinuria, unspecified: Secondary | ICD-10-CM | POA: Diagnosis not present

## 2016-02-09 DIAGNOSIS — N183 Chronic kidney disease, stage 3 (moderate): Secondary | ICD-10-CM | POA: Diagnosis not present

## 2016-02-09 DIAGNOSIS — I10 Essential (primary) hypertension: Secondary | ICD-10-CM | POA: Diagnosis not present

## 2016-02-09 DIAGNOSIS — D631 Anemia in chronic kidney disease: Secondary | ICD-10-CM | POA: Diagnosis not present

## 2016-02-12 DIAGNOSIS — L570 Actinic keratosis: Secondary | ICD-10-CM | POA: Diagnosis not present

## 2016-02-27 DIAGNOSIS — Z23 Encounter for immunization: Secondary | ICD-10-CM | POA: Diagnosis not present

## 2016-03-05 ENCOUNTER — Other Ambulatory Visit: Payer: Self-pay | Admitting: Family Medicine

## 2016-03-06 ENCOUNTER — Inpatient Hospital Stay: Payer: Medicare Other

## 2016-03-06 ENCOUNTER — Inpatient Hospital Stay: Payer: Medicare Other | Attending: Hematology and Oncology | Admitting: Hematology and Oncology

## 2016-03-06 ENCOUNTER — Encounter: Payer: Self-pay | Admitting: Hematology and Oncology

## 2016-03-06 VITALS — BP 135/79 | HR 52 | Temp 97.3°F | Resp 18 | Ht 70.0 in | Wt 211.9 lb

## 2016-03-06 DIAGNOSIS — Z7982 Long term (current) use of aspirin: Secondary | ICD-10-CM | POA: Insufficient documentation

## 2016-03-06 DIAGNOSIS — Z7984 Long term (current) use of oral hypoglycemic drugs: Secondary | ICD-10-CM | POA: Diagnosis not present

## 2016-03-06 DIAGNOSIS — I2510 Atherosclerotic heart disease of native coronary artery without angina pectoris: Secondary | ICD-10-CM | POA: Diagnosis not present

## 2016-03-06 DIAGNOSIS — E785 Hyperlipidemia, unspecified: Secondary | ICD-10-CM | POA: Insufficient documentation

## 2016-03-06 DIAGNOSIS — I129 Hypertensive chronic kidney disease with stage 1 through stage 4 chronic kidney disease, or unspecified chronic kidney disease: Secondary | ICD-10-CM

## 2016-03-06 DIAGNOSIS — R0602 Shortness of breath: Secondary | ICD-10-CM | POA: Diagnosis not present

## 2016-03-06 DIAGNOSIS — R809 Proteinuria, unspecified: Secondary | ICD-10-CM | POA: Diagnosis not present

## 2016-03-06 DIAGNOSIS — I25810 Atherosclerosis of coronary artery bypass graft(s) without angina pectoris: Secondary | ICD-10-CM | POA: Diagnosis not present

## 2016-03-06 DIAGNOSIS — D631 Anemia in chronic kidney disease: Secondary | ICD-10-CM | POA: Diagnosis not present

## 2016-03-06 DIAGNOSIS — E119 Type 2 diabetes mellitus without complications: Secondary | ICD-10-CM | POA: Diagnosis not present

## 2016-03-06 DIAGNOSIS — N183 Chronic kidney disease, stage 3 (moderate): Secondary | ICD-10-CM | POA: Diagnosis not present

## 2016-03-06 DIAGNOSIS — Z79899 Other long term (current) drug therapy: Secondary | ICD-10-CM | POA: Diagnosis not present

## 2016-03-06 DIAGNOSIS — D649 Anemia, unspecified: Secondary | ICD-10-CM

## 2016-03-06 DIAGNOSIS — I10 Essential (primary) hypertension: Secondary | ICD-10-CM | POA: Diagnosis not present

## 2016-03-06 LAB — CBC WITH DIFFERENTIAL/PLATELET
Basophils Absolute: 0 10*3/uL (ref 0–0.1)
Basophils Relative: 1 %
Eosinophils Absolute: 0.2 10*3/uL (ref 0–0.7)
Eosinophils Relative: 3 %
HCT: 35.7 % — ABNORMAL LOW (ref 40.0–52.0)
Hemoglobin: 11.2 g/dL — ABNORMAL LOW (ref 13.0–18.0)
Lymphocytes Relative: 27 %
Lymphs Abs: 1.3 10*3/uL (ref 1.0–3.6)
MCH: 19.6 pg — ABNORMAL LOW (ref 26.0–34.0)
MCHC: 31.4 g/dL — ABNORMAL LOW (ref 32.0–36.0)
MCV: 62.5 fL — ABNORMAL LOW (ref 80.0–100.0)
Monocytes Absolute: 0.5 10*3/uL (ref 0.2–1.0)
Monocytes Relative: 9 %
Neutro Abs: 3.1 10*3/uL (ref 1.4–6.5)
Neutrophils Relative %: 60 %
Platelets: 151 10*3/uL (ref 150–440)
RBC: 5.72 MIL/uL (ref 4.40–5.90)
RDW: 16.3 % — ABNORMAL HIGH (ref 11.5–14.5)
WBC: 5.1 10*3/uL (ref 3.8–10.6)

## 2016-03-06 LAB — SEDIMENTATION RATE: Sed Rate: 5 mm/hr (ref 0–20)

## 2016-03-06 NOTE — Progress Notes (Signed)
[facility] day:  03/06/2016  Chief Complaint: [name] is a 70 y.o. male with anemia who is referred in consultation by Dr. Anthonette Legato for assessment and management.  HPI:  The patient states that he has been anemic since the age of 75.  He has been asymptomatic without intervention.  He has not been on oral iron. He previously donated blood up to twice a year.  He last donated blood in the fall of 2016.  He describes his diet as "okay".  He eats 6 servings of meat per week. He eats green leafy vegetables. He denies any pica.  Colonoscopy 2 years ago was negative per patient.  He denies any melena or hematochezia.  He denies any hematuria.  There is no family history of any blood disorder.  The patient was referred to [facility] for chronic kidney disease stage III, proteinuria, type 2 diabetes, hypertension and anemia of chronic disease.  He has been followed by Dr. Holley Raring.  Extensive serologic workup was negative.  Labs on 02/09/2016 included a ferritin of 83, iron saturation 25% and TIBC 297. CBC included a hematocrit 35.2, hemoglobin 11.5, MCV 61, platelets 127,000, white count 5300 with an [place] of 3600. Creatinine was 1.73 with an estimated GFR of 39 ml/min.  Calcium was 9.8, albumin 4.5, and protein 7.0. SPEP revealed no monoclonal protein. Urinalysis revealed trace protein  He denies any B symptoms.  Energy level is good. He is active but states he has no stamina for hiking.  He notes some shortness of breath on exertion.   Past Medical History:  Diagnosis Date  . Coronary artery disease   . Hyperlipidemia   . Hypertension   . Type II diabetes mellitus ([place])     Past Surgical History:  Procedure Laterality Date  . CARDIAC CATHETERIZATION N/A 04/18/2015   Procedure: Left Heart Cath and Coronary Angiography;  Surgeon: Yolonda Kida, MD;  Location: [facility];  Service: Cardiovascular;   Laterality: N/A;  . COLONOSCOPY  2014   cleared for 10 yrs  . CORONARY ARTERY BYPASS GRAFT N/A 04/21/2015   Procedure: CORONARY ARTERY BYPASS GRAFTING (CABG) X 4 UTILIZING THE LEFT INTERNAL MAMMARY ARTERY TO LAD, ENDOSCOPICALLY HARVESTED RIGHT SAPHENEOUS VEINS TO PD,DIAGONAL AND CIRCUMFLEX.;  Surgeon: Grace Isaac, MD;  Location: [place];  Service: Open Heart Surgery;  Laterality: N/A;  . DUPUYTREN CONTRACTURE RELEASE Left ~ 2000  . KNEE ARTHROSCOPY Right ~ 1995  . TEE WITHOUT CARDIOVERSION N/A 04/21/2015   Procedure: TRANSESOPHAGEAL ECHOCARDIOGRAM (TEE);  Surgeon: Grace Isaac, MD;  Location: [place];  Service: Open Heart Surgery;  Laterality: N/A;    Family History  Problem Relation Age of Onset  . Diabetes Mother   . Heart attack    . Hypertension      Social History:  reports that he has never smoked. He has never used smokeless tobacco. He reports that he drinks alcohol. He reports that he does not use drugs.  He denies any exposure to radiation or toxins.  He is retired from [employer].  The patient lives in [place].  The patient is alone today.  Allergies: No Known Allergies  Current Medications: Current Outpatient Prescriptions  Medication Sig Dispense Refill  . aspirin 81 MG tablet Take 81 mg by mouth daily.    Marland Kitchen atorvastatin (LIPITOR) 40 MG tablet Take 1 tablet (40 mg total) by mouth daily. 90 tablet 1  .  FREESTYLE TEST STRIPS test strip TEST 1 TIME DAILY 50 each 0  . glipiZIDE (GLUCOTROL) 5 MG tablet Take 1 tablet (5 mg total) by mouth 2 (two) times daily before a meal. 180 tablet 1  . glucose blood (FREESTYLE TEST STRIPS) test strip 1 each by Other route daily. Use as instructed- E11.9 50 each 0  . losartan (COZAAR) 50 MG tablet Take 1 tablet (50 mg total) by mouth daily. 90 tablet 1  . metFORMIN (GLUCOPHAGE) 500 MG tablet Take 1 tablet (500 mg total) by mouth 2 (two) times daily with a meal. 180 tablet 1  . metoprolol tartrate (LOPRESSOR) 25 MG tablet  Take 0.5 tablets (12.5 mg total) by mouth 2 (two) times daily. 90 tablet 1  . VIAGRA 50 MG tablet TAKE 1 TABLET BY MOUTH AS NEEDED 7 tablet 0   No current facility-administered medications for this visit.     Review of Systems:  GENERAL:  Feels good.  Active.  No fevers, sweats or weight loss. PERFORMANCE STATUS (ECOG):  1 HEENT:  Cataracts.  No visual changes, runny nose, sore throat, mouth sores or tenderness. Lungs: Shortness of breath on exertion.  No cough.  No hemoptysis. Cardiac:  No chest pain, palpitations, orthopnea, or PND. GI:  Once in awhile diarrhea secondary to metformin.  No nausea, vomiting, constipation, melena or hematochezia.  Colonoscopy 2 years ago. GU:  No urgency, frequency, dysuria, or hematuria. Musculoskeletal:  No back pain.  Right knee issues.  No muscle tenderness. Extremities:  No pain or swelling. Skin:  No rashes or skin changes. Neuro:  No headache, numbness or weakness, balance or coordination issues. Endocrine:  Diabetes.  No thyroid issues, hot flashes or night sweats. Psych:  No mood changes, depression or anxiety. Pain:  No focal pain. Review of systems:  All other systems reviewed and found to be negative.  Physical Exam: Blood pressure 135/79, pulse (!) 45, temperature 97.3 F (36.3 C), temperature source Tympanic, resp. rate 18, height '5\' 10"'$  (1.778 m), weight 211 lb 13.8 oz (96.1 kg). GENERAL:  Well developed, well nourished, gentleman sitting comfortably in the exam room in no acute distress. MENTAL STATUS:  Alert and oriented to person, place and time. HEAD:  White hair.  Normocephalic, atraumatic, face symmetric, no Cushingoid features. EYES:  Glasses.  Pupils equal round and reactive to light and accomodation.  No conjunctivitis or scleral icterus. ENT:  Oropharynx clear without lesion.  Tongue normal. Mucous membranes moist.  RESPIRATORY:  Clear to auscultation without rales, wheezes or rhonchi. CARDIOVASCULAR:  Regular rate and rhythm  without murmur, rub or gallop. ABDOMEN:  Soft, non-tender, with active bowel sounds, and no hepatosplenomegaly.  No masses. SKIN:  No rashes, ulcers or lesions. EXTREMITIES: No edema, no skin discoloration or tenderness.  No palpable cords. LYMPH NODES: No palpable cervical, supraclavicular, axillary or inguinal adenopathy  NEUROLOGICAL: Unremarkable. PSYCH:  Appropriate.   No visits with results within 3 Day(s) from this visit.  Latest known visit with results is:  Office Visit on 11/03/2015  Component Date Value Ref Range Status  . Hgb A1c MFr Bld 11/04/2015 7.0* 4.8 - 5.6 % Final   Comment:          Pre-diabetes: 5.7 - 6.4          Diabetes: >6.4          Glycemic control for adults with diabetes: <7.0   . Est. average glucose Bld gHb Est-m* 11/04/2015 154  mg/dL Final  . Cholesterol, Total 11/04/2015  111  100 - 199 mg/dL Final  . Triglycerides 11/04/2015 109  0 - 149 mg/dL Final  . HDL 11/04/2015 35* >39 mg/dL Final  . VLDL Cholesterol Cal 11/04/2015 22  5 - 40 mg/dL Final  . LDL Calculated 11/04/2015 54  0 - 99 mg/dL Final  . Chol/HDL Ratio 11/04/2015 3.2  0.0 - 5.0 ratio units Final   Comment:                                   T. Chol/HDL Ratio                                             Men  Women                               1/2 Avg.Risk  3.4    3.3                                   Avg.Risk  5.0    4.4                                2X Avg.Risk  9.6    7.1                                3X Avg.Risk 23.4   11.0   . Glucose 11/04/2015 135* 65 - 99 mg/dL Final  . BUN 11/04/2015 29* 8 - 27 mg/dL Final  . Creatinine, Ser 11/04/2015 1.90* 0.76 - 1.27 mg/dL Final  . GFR calc non Af Amer 11/04/2015 35* >59 mL/min/1.73 Final  . GFR calc Af Amer 11/04/2015 40* >59 mL/min/1.73 Final  . BUN/Creatinine Ratio 11/04/2015 15  10 - 24 Final  . Sodium 11/04/2015 140  134 - 144 mmol/L Final  . Potassium 11/04/2015 5.5* 3.5 - 5.2 mmol/L Final  . Chloride 11/04/2015 100  96 - 106 mmol/L  Final  . CO2 11/04/2015 26  18 - 29 mmol/L Final  . Calcium 11/04/2015 10.1  8.6 - 10.2 mg/dL Final  . Phosphorus 11/04/2015 3.6  2.5 - 4.5 mg/dL Final  . Albumin 11/04/2015 4.5  3.5 - 4.8 g/dL Final    Assessment:  [name] is a 70 y.o. male with stage III chronic kidney disease and a microcytic anemia with normal iron studies and mild anemia.  He notes a history of "anemia" since age 82.  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  He has donated blood in the past (last fall 2016).  Diet is good.  Colonoscopy 2 years ago was negative per patient.  He denies any melena or hematochezia.  Labs on 02/09/2016 revealed a hematocrit 35.2, hemoglobin 11.5, MCV 61, platelets 127,000, white count 5300 with an [place] of 3600. Ferritin was 83, iron saturation 25% and TIBC 297.  Creatinine was 1.73 with an estimated GFR of 39 ml/min.  Calcium, albumin, and protein were normal. SPEP revealed no monoclonal protein. Urinalysis revealed trace protein.  Symptomatically, he feels good.  He has shortness of breath on exertion.  Exam  is unremarkable.  Plan: 1.  Discuss anemia.  Red blood cells are microcytic with normal iron stores.  Microcytosis is out of proportion to level of anemia.  Suspect thalassemia.  Discuss work-up. 2.  Labs today:  CBC with diff, ferritin, iron studies, ESR, free light chains, folate, B12, epo level, hemoglobin electrophoresis. 3.  Collect 24 hour urine for UPEP. 4.  RTC in 2 weeks for review of testing.   Lequita Asal, MD  03/06/2016, 2:46 PM

## 2016-03-06 NOTE — Progress Notes (Signed)
Patient here today as new evaluation regarding anemia.  Referred by Dr. Holley Raring.

## 2016-03-07 DIAGNOSIS — I129 Hypertensive chronic kidney disease with stage 1 through stage 4 chronic kidney disease, or unspecified chronic kidney disease: Secondary | ICD-10-CM | POA: Diagnosis not present

## 2016-03-07 DIAGNOSIS — D631 Anemia in chronic kidney disease: Secondary | ICD-10-CM | POA: Diagnosis not present

## 2016-03-07 DIAGNOSIS — E119 Type 2 diabetes mellitus without complications: Secondary | ICD-10-CM | POA: Diagnosis not present

## 2016-03-07 DIAGNOSIS — R809 Proteinuria, unspecified: Secondary | ICD-10-CM | POA: Diagnosis not present

## 2016-03-07 DIAGNOSIS — N183 Chronic kidney disease, stage 3 (moderate): Secondary | ICD-10-CM | POA: Diagnosis not present

## 2016-03-07 DIAGNOSIS — R0602 Shortness of breath: Secondary | ICD-10-CM | POA: Diagnosis not present

## 2016-03-07 LAB — HEMOGLOBINOPATHY EVALUATION
Hgb A2 Quant: 5.4 % — ABNORMAL HIGH (ref 0.7–3.1)
Hgb A: 94.6 % (ref 94.0–98.0)
Hgb C: 0 %
Hgb F Quant: 0 % (ref 0.0–2.0)
Hgb S Quant: 0 %

## 2016-03-07 LAB — IRON AND TIBC
Iron: 93 ug/dL (ref 45–182)
Saturation Ratios: 28 % (ref 17.9–39.5)
TIBC: 331 ug/dL (ref 250–450)
UIBC: 238 ug/dL

## 2016-03-07 LAB — FERRITIN: Ferritin: 47 ng/mL (ref 24–336)

## 2016-03-07 LAB — KAPPA/LAMBDA LIGHT CHAINS
Kappa free light chain: 21.2 mg/L — ABNORMAL HIGH (ref 3.3–19.4)
Kappa, lambda light chain ratio: 0.93 (ref 0.26–1.65)
Lambda free light chains: 22.9 mg/L (ref 5.7–26.3)

## 2016-03-07 LAB — VITAMIN B12: Vitamin B-12: 383 pg/mL (ref 180–914)

## 2016-03-07 LAB — FOLATE: Folate: 11.4 ng/mL (ref >5.9)

## 2016-03-07 LAB — ERYTHROPOIETIN: Erythropoietin: 15.8 m[IU]/mL (ref 2.6–18.5)

## 2016-03-08 ENCOUNTER — Ambulatory Visit: Payer: Medicare Other

## 2016-03-11 DIAGNOSIS — L570 Actinic keratosis: Secondary | ICD-10-CM | POA: Diagnosis not present

## 2016-03-11 LAB — IFE+PROTEIN ELECTRO, 24-HR UR
% BETA, Urine: 26.2 %
ALPHA 1 URINE: 7.7 %
Albumin, U: 32.4 %
Alpha 2, Urine: 13 %
GAMMA GLOBULIN URINE: 20.7 %
PDF: 0
Total Protein, Urine-Ur/day: 395 mg/24 hr — ABNORMAL HIGH (ref 30–150)
Total Protein, Urine: 14.1 mg/dL
Total Volume: 2800

## 2016-03-20 ENCOUNTER — Inpatient Hospital Stay: Payer: Medicare Other | Attending: Hematology and Oncology | Admitting: Hematology and Oncology

## 2016-03-20 DIAGNOSIS — N183 Chronic kidney disease, stage 3 (moderate): Secondary | ICD-10-CM | POA: Diagnosis not present

## 2016-03-20 DIAGNOSIS — I10 Essential (primary) hypertension: Secondary | ICD-10-CM | POA: Insufficient documentation

## 2016-03-20 DIAGNOSIS — Z7982 Long term (current) use of aspirin: Secondary | ICD-10-CM

## 2016-03-20 DIAGNOSIS — E119 Type 2 diabetes mellitus without complications: Secondary | ICD-10-CM | POA: Diagnosis not present

## 2016-03-20 DIAGNOSIS — Z79899 Other long term (current) drug therapy: Secondary | ICD-10-CM | POA: Diagnosis not present

## 2016-03-20 DIAGNOSIS — I129 Hypertensive chronic kidney disease with stage 1 through stage 4 chronic kidney disease, or unspecified chronic kidney disease: Secondary | ICD-10-CM | POA: Diagnosis not present

## 2016-03-20 DIAGNOSIS — D509 Iron deficiency anemia, unspecified: Secondary | ICD-10-CM | POA: Diagnosis not present

## 2016-03-20 DIAGNOSIS — I2510 Atherosclerotic heart disease of native coronary artery without angina pectoris: Secondary | ICD-10-CM

## 2016-03-20 DIAGNOSIS — D563 Thalassemia minor: Secondary | ICD-10-CM | POA: Insufficient documentation

## 2016-03-20 DIAGNOSIS — E785 Hyperlipidemia, unspecified: Secondary | ICD-10-CM | POA: Diagnosis not present

## 2016-03-20 DIAGNOSIS — Z7984 Long term (current) use of oral hypoglycemic drugs: Secondary | ICD-10-CM | POA: Diagnosis not present

## 2016-03-20 DIAGNOSIS — R0602 Shortness of breath: Secondary | ICD-10-CM | POA: Insufficient documentation

## 2016-03-20 NOTE — Progress Notes (Signed)
No new diagnosis since last visit.  Patient offers no complaints today.

## 2016-03-20 NOTE — Progress Notes (Signed)
[facility] day:  03/20/2016  Chief Complaint: [name] is a 70 y.o. male with microcytic anemia who is seen for review of work-up and discussion regarding direction of therapy.  HPI:  The patient was last seen in the hematology clinic on 03/06/2016.  At that time, he was seen for initial consultation.  He had been "anemic" since age 17.  He had microcytic RBC indices with a relatively mild anemia suggestive of thalassemia.  He had stage III chronic kidney disease with a negative serologic work-up.  He denied any bleeding.  Colonoscopy 2 years ago was negative.    He underwent a work-up.  CBC revealed a hematocrit of 35.7, hemoglobin 11.2, MCV 62.5, platelets 151,000, white count 5100 with an [place] of 3100.  Ferritin was 47 and iron saturation of 28% and a TIBC of 331. Sedimentation rate was 5.   Epo level was 15.8.  B12 was 383.  Folate was 11.4.  Kappa free light chains were 21.2 with lambda free light chains of 22.9 and a ratio of 0.93 (normal).  24 hour UPEP on 03/08/2016 revealed no monoclonal protein.  Hemoglobin electrophoresis revealed a hemoglobin A2 5.4% and a hemoglobin A of 94.6%.  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  Symptomatically, he feels the same.   Past Medical History:  Diagnosis Date  . Coronary artery disease   . Hyperlipidemia   . Hypertension   . Type II diabetes mellitus ([place])     Past Surgical History:  Procedure Laterality Date  . CARDIAC CATHETERIZATION N/A 04/18/2015   Procedure: Left Heart Cath and Coronary Angiography;  Surgeon: Yolonda Kida, MD;  Location: [facility];  Service: Cardiovascular;  Laterality: N/A;  . COLONOSCOPY  2014   cleared for 10 yrs  . CORONARY ARTERY BYPASS GRAFT N/A 04/21/2015   Procedure: CORONARY ARTERY BYPASS GRAFTING (CABG) X 4 UTILIZING THE LEFT INTERNAL MAMMARY ARTERY TO LAD, ENDOSCOPICALLY HARVESTED RIGHT SAPHENEOUS  VEINS TO PD,DIAGONAL AND CIRCUMFLEX.;  Surgeon: Grace Isaac, MD;  Location: [facility];  Service: Open Heart Surgery;  Laterality: N/A;  . DUPUYTREN CONTRACTURE RELEASE Left ~ 2000  . KNEE ARTHROSCOPY Right ~ 1995  . TEE WITHOUT CARDIOVERSION N/A 04/21/2015   Procedure: TRANSESOPHAGEAL ECHOCARDIOGRAM (TEE);  Surgeon: Grace Isaac, MD;  Location: [facility];  Service: Open Heart Surgery;  Laterality: N/A;    Family History  Problem Relation Age of Onset  . Diabetes Mother   . Heart attack    . Hypertension      Social History:  reports that he has never smoked. He has never used smokeless tobacco. He reports that he drinks alcohol. He reports that he does not use drugs.  He denies any exposure to radiation or toxins.  He is retired from [employer].  The patient lives in [place].  The patient is alone today.  Allergies: No Known Allergies  Current Medications: Current Outpatient Prescriptions  Medication Sig Dispense Refill  . aspirin 81 MG tablet Take 81 mg by mouth daily.    Marland Kitchen atorvastatin (LIPITOR) 40 MG tablet Take 1 tablet (40 mg total) by mouth daily. 90 tablet 1  . FREESTYLE TEST STRIPS test strip TEST 1 TIME DAILY 50 each 0  . glipiZIDE (GLUCOTROL) 5 MG tablet Take 1 tablet (5 mg total) by mouth 2 (two) times daily before a meal. 180 tablet 1  . glucose blood (FREESTYLE TEST STRIPS) test strip  1 each by Other route daily. Use as instructed- E11.9 50 each 0  . losartan (COZAAR) 50 MG tablet Take 1 tablet (50 mg total) by mouth daily. 90 tablet 1  . metFORMIN (GLUCOPHAGE) 500 MG tablet Take 1 tablet (500 mg total) by mouth 2 (two) times daily with a meal. 180 tablet 1  . metoprolol tartrate (LOPRESSOR) 25 MG tablet Take 0.5 tablets (12.5 mg total) by mouth 2 (two) times daily. 90 tablet 1  . VIAGRA 50 MG tablet TAKE 1 TABLET BY MOUTH AS NEEDED 7 tablet 0   No current facility-administered medications for this visit.     Review of Systems:  GENERAL:  Feels good.   No fevers or sweats.  Weight up 3 pounds. PERFORMANCE STATUS (ECOG):  1 HEENT:  Cataracts.  No visual changes, runny nose, sore throat, mouth sores or tenderness. Lungs: Shortness of breath on exertion.  No cough.  No hemoptysis. Cardiac:  No chest pain, palpitations, orthopnea, or PND. GI:  Once in awhile diarrhea secondary to Metformin.  No nausea, vomiting, constipation, melena or hematochezia.  Colonoscopy 2 years ago. GU:  No urgency, frequency, dysuria, or hematuria. Musculoskeletal:  No back pain.  Right knee issues.  No muscle tenderness. Extremities:  No pain or swelling. Skin:  No rashes or skin changes. Neuro:  No headache, numbness or weakness, balance or coordination issues. Endocrine:  Diabetes.  No thyroid issues, hot flashes or night sweats. Psych:  No mood changes, depression or anxiety. Pain:  No focal pain. Review of systems:  All other systems reviewed and found to be negative.  Physical Exam: Blood pressure 132/80, pulse (!) 44, temperature (!) 96.4 F (35.8 C), temperature source Tympanic, resp. rate 18, weight 214 lb 8.1 oz (97.3 kg). GENERAL:  Well developed, well nourished, gentleman sitting comfortably in the exam room in no acute distress. MENTAL STATUS:  Alert and oriented to person, place and time. HEAD:  White hair.  Normocephalic, atraumatic, face symmetric, no Cushingoid features. EYES:  Glasses. No conjunctivitis or scleral icterus. NEUROLOGICAL: Unremarkable. PSYCH:  Appropriate.   No visits with results within 3 Day(s) from this visit.  Latest known visit with results is:  Appointment on 03/08/2016  Component Date Value Ref Range Status  . Total Protein, Urine 03/11/2016 14.1  Not Estab. mg/dL Final  . Total Protein, Urine-Ur/day 03/11/2016 395* 30 - 150 mg/24 hr Final  . Albumin, U 03/11/2016 32.4  % Final  . ALPHA 1 URINE 03/11/2016 7.7  % Final  . Alpha 2, Urine 03/11/2016 13.0  % Final  . % BETA, Urine 03/11/2016 26.2  % Final  . GAMMA  GLOBULIN URINE 03/11/2016 20.7  % Final  . M-SPIKE %, Urine 03/11/2016 Not Observed  Not Observed % Final  . Immunofixation Result, Urine 03/11/2016 Comment   Final  . Note: 03/11/2016 Comment   Final   Comment: (NOTE) Protein electrophoresis scan will follow via computer, mail, or courier delivery.   . Total Volume 03/11/2016 2800   Final  . PDF 03/11/2016 .   Corrected   Comment: (NOTE) Performed At: [facility] [address] [place], [place] [id number] Lindon Romp MD [id number]     Assessment:  [name] is a 70 y.o. male with stage III chronic kidney disease.  He has beta thalassemia minor.  RBCs are microcytic with a mild anemia.  He notes a history of "anemia" since age 53.  He has donated blood in the past (last fall 2016).  Diet is good.  Colonoscopy 2 years ago was negative per patient.  He denies any melena or hematochezia.  Labs on 02/09/2016 revealed a hematocrit 35.2, hemoglobin 11.5, MCV 61, platelets 127,000, white count 5300 with an [place] of 3600. Ferritin was 83, iron saturation 25% and TIBC 297.  Creatinine was 1.73 with an estimated GFR of 39 ml/min.  Calcium, albumin, and protein were normal. SPEP revealed no monoclonal protein. Urinalysis revealed trace protein.  Work-up on 03/06/2016 revealed a hematocrit of 35.7, hemoglobin 11.2, MCV 62.5, platelets 151,000, white count 5100 with an [place] of 3100.  Normal studies included: ferritin (47), iron saturation (28%), TIBC (331), ESR (5), B12, folate, free light chain ratio, and 24 hour UPEP.  Epo level was 15.8.  Hemoglobin electrophoresis revealed a hemoglobin A2 5.4% and a hemoglobin A of 94.6%.  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  Symptomatically, he feels good.  He has shortness of breath on exertion.  Exam is unremarkable.  Plan: 1.  Discuss work-up and diagnosis of beta thalassemia minor.  Discuss alpha and beta chains in hemoglobin production.   There are four alpha and two beta globin genes. Beta thalassemia minor or beta thalassemia trait is caused by mutations in one of these 2 genes.  Beta thalassemia minor patients are typically asymptomatic carriers. Patients have mild anemia and microcytosis which can often be mistaken for iron deficiency.  No intervention is needed.  2.  Discuss anemia of chronic chronic kidney disease.  No intervention required at this time. 3.  RTC prn.   Lequita Asal, MD  03/20/2016, 2:27 PM

## 2016-03-27 ENCOUNTER — Other Ambulatory Visit: Payer: Self-pay | Admitting: Family Medicine

## 2016-04-26 ENCOUNTER — Other Ambulatory Visit: Payer: Self-pay | Admitting: Family Medicine

## 2016-05-01 ENCOUNTER — Other Ambulatory Visit: Payer: Self-pay | Admitting: Family Medicine

## 2016-05-06 ENCOUNTER — Ambulatory Visit (INDEPENDENT_AMBULATORY_CARE_PROVIDER_SITE_OTHER): Payer: Medicare Other | Admitting: Family Medicine

## 2016-05-06 ENCOUNTER — Encounter: Payer: Self-pay | Admitting: Family Medicine

## 2016-05-06 VITALS — BP 120/70 | HR 60 | Ht 70.0 in | Wt 215.0 lb

## 2016-05-06 DIAGNOSIS — E1159 Type 2 diabetes mellitus with other circulatory complications: Secondary | ICD-10-CM | POA: Diagnosis not present

## 2016-05-06 DIAGNOSIS — E785 Hyperlipidemia, unspecified: Secondary | ICD-10-CM

## 2016-05-06 DIAGNOSIS — N529 Male erectile dysfunction, unspecified: Secondary | ICD-10-CM

## 2016-05-06 DIAGNOSIS — E1169 Type 2 diabetes mellitus with other specified complication: Secondary | ICD-10-CM

## 2016-05-06 DIAGNOSIS — I10 Essential (primary) hypertension: Secondary | ICD-10-CM

## 2016-05-06 MED ORDER — LOSARTAN POTASSIUM 50 MG PO TABS: 50.0000 mg | ORAL_TABLET | Freq: Every day | ORAL | 1 refills | Status: DC

## 2016-05-06 MED ORDER — GLIPIZIDE 5 MG PO TABS: 5.0000 mg | ORAL_TABLET | Freq: Two times a day (BID) | ORAL | 1 refills | Status: DC

## 2016-05-06 MED ORDER — ATORVASTATIN CALCIUM 40 MG PO TABS: 40.0000 mg | ORAL_TABLET | Freq: Every day | ORAL | 1 refills | Status: DC

## 2016-05-06 MED ORDER — SILDENAFIL CITRATE 50 MG PO TABS: 50.0000 mg | ORAL_TABLET | ORAL | 11 refills | Status: DC | PRN

## 2016-05-06 MED ORDER — METOPROLOL TARTRATE 25 MG PO TABS: 12.5000 mg | ORAL_TABLET | Freq: Two times a day (BID) | ORAL | 1 refills | Status: DC

## 2016-05-06 MED ORDER — GLUCOSE BLOOD VI STRP: 1.0000 | ORAL_STRIP | 11 refills | Status: DC | PRN

## 2016-05-06 MED ORDER — METFORMIN HCL 500 MG PO TABS: 500.0000 mg | ORAL_TABLET | Freq: Two times a day (BID) | ORAL | 1 refills | Status: DC

## 2016-05-06 NOTE — Progress Notes (Signed)
Name: [name]   MRN: [id number]    DOB: 06-17-1945   Date:05/06/2016       Progress Note  Subjective  Chief Complaint  Chief Complaint  Patient presents with  . Hypertension  . Hyperlipidemia  . Diabetes  . Erectile Dysfunction    Hypertension  This is a chronic problem. The current episode started more than 1 year ago. The problem is unchanged. The problem is controlled. Pertinent negatives include no anxiety, blurred vision, chest pain, headaches, malaise/fatigue, neck pain, orthopnea, palpitations, peripheral edema, PND, shortness of breath or sweats. Risk factors for coronary artery disease include diabetes mellitus, dyslipidemia, male gender and post-menopausal state. Past treatments include angiotensin blockers and beta blockers. The current treatment provides moderate improvement. There are no compliance problems.  There is no history of angina, kidney disease, CAD/MI, CVA, heart failure, left ventricular hypertrophy, PVD, renovascular disease or retinopathy. There is no history of chronic renal disease or a hypertension causing med.  Hyperlipidemia  This is a chronic problem. The current episode started more than 1 year ago. The problem is controlled. Recent lipid tests were reviewed and are normal. He has no history of chronic renal disease, diabetes, hypothyroidism, liver disease, obesity or nephrotic syndrome. Factors aggravating his hyperlipidemia include beta blockers. Pertinent negatives include no chest pain, focal weakness, myalgias or shortness of breath. Current antihyperlipidemic treatment includes statins. The current treatment provides mild improvement of lipids. There are no compliance problems.   Diabetes  He presents for his follow-up diabetic visit. He has type 2 diabetes mellitus. Pertinent negatives for hypoglycemia include no dizziness, headaches, nervousness/anxiousness or sweats. Pertinent negatives for diabetes include no blurred vision, no chest pain, no  fatigue, no foot paresthesias, no foot ulcerations, no polydipsia, no polyphagia, no polyuria, no visual change, no weakness and no weight loss. Diabetic complications include impotence. Pertinent negatives for diabetic complications include no autonomic neuropathy, CVA, heart disease, nephropathy, peripheral neuropathy, PVD or retinopathy. He is following a generally healthy diet. His home blood glucose trend is fluctuating minimally. His breakfast blood glucose is taken between 8-9 am. His breakfast blood glucose range is generally 130-140 mg/dl. An ACE inhibitor/angiotensin II receptor blocker is being taken. He does not see a podiatrist.Eye exam is not current.  Erectile Dysfunction  This is a chronic problem. The current episode started more than 1 year ago. The problem has been waxing and waning since onset. He reports no anxiety. Irritative symptoms do not include frequency or urgency. Obstructive symptoms do not include dribbling or an intermittent stream. Pertinent negatives include no chills, dysuria or hematuria. Past treatments include sildenafil.    No problem-specific Assessment & Plan notes found for this encounter.   Past Medical History:  Diagnosis Date  . Coronary artery disease   . Hyperlipidemia   . Hypertension   . Type II diabetes mellitus ([place])     Past Surgical History:  Procedure Laterality Date  . CARDIAC CATHETERIZATION N/A 04/18/2015   Procedure: Left Heart Cath and Coronary Angiography;  Surgeon: Yolonda Kida, MD;  Location: [facility];  Service: Cardiovascular;  Laterality: N/A;  . COLONOSCOPY  2014   cleared for 10 yrs  . CORONARY ARTERY BYPASS GRAFT N/A 04/21/2015   Procedure: CORONARY ARTERY BYPASS GRAFTING (CABG) X 4 UTILIZING THE LEFT INTERNAL MAMMARY ARTERY TO LAD, ENDOSCOPICALLY HARVESTED RIGHT SAPHENEOUS VEINS TO PD,DIAGONAL AND CIRCUMFLEX.;  Surgeon: Grace Isaac, MD;  Location: [facility];  Service: Open Heart Surgery;  Laterality: N/A;  .  DUPUYTREN CONTRACTURE RELEASE Left ~ 2000  . KNEE ARTHROSCOPY Right ~ 1995  . TEE WITHOUT CARDIOVERSION N/A 04/21/2015   Procedure: TRANSESOPHAGEAL ECHOCARDIOGRAM (TEE);  Surgeon: Grace Isaac, MD;  Location: [facility];  Service: Open Heart Surgery;  Laterality: N/A;    Family History  Problem Relation Age of Onset  . Diabetes Mother   . Heart attack    . Hypertension      Social History   Social History  . Marital status: Married    Spouse name: N/A  . Number of children: N/A  . Years of education: N/A   Occupational History  . Not on file.   Social History Main Topics  . Smoking status: Never Smoker  . Smokeless tobacco: Never Used  . Alcohol use 0.0 oz/week     Comment: 04/19/2015 "might have a drink a couple times/yr; at most"  . Drug use: No  . Sexual activity: Not Currently   Other Topics Concern  . Not on file   Social History Narrative  . No narrative on file    No Known Allergies   Review of Systems  Constitutional: Negative for chills, fatigue, fever, malaise/fatigue and weight loss.  HENT: Negative for ear discharge, ear pain and sore throat.   Eyes: Negative for blurred vision.  Respiratory: Negative for cough, sputum production, shortness of breath and wheezing.   Cardiovascular: Negative for chest pain, palpitations, orthopnea, leg swelling and PND.  Gastrointestinal: Negative for abdominal pain, blood in stool, constipation, diarrhea, heartburn, melena and nausea.  Genitourinary: Positive for impotence. Negative for dysuria, frequency, hematuria and urgency.  Musculoskeletal: Negative for back pain, joint pain, myalgias and neck pain.  Skin: Negative for rash.  Neurological: Negative for dizziness, tingling, sensory change, focal weakness, weakness and headaches.  Endo/Heme/Allergies: Negative for environmental allergies, polydipsia and polyphagia. Does not bruise/bleed easily.  Psychiatric/Behavioral: Negative for depression and suicidal ideas. The  patient is not nervous/anxious and does not have insomnia.      Objective  Vitals:   05/06/16 1335  BP: 120/70  Pulse: 60  Weight: 215 lb (97.5 kg)  Height: 5\' 10"  (1.778 m)    Physical Exam  Constitutional: He is oriented to person, place, and time and well-developed, well-nourished, and in no distress.  HENT:  Head: Normocephalic.  Right Ear: External ear normal.  Left Ear: External ear normal.  Nose: Nose normal.  Mouth/Throat: Oropharynx is clear and moist.  Eyes: Conjunctivae and EOM are normal. Pupils are equal, round, and reactive to light. Right eye exhibits no discharge. Left eye exhibits no discharge. No scleral icterus.  Neck: Normal range of motion. Neck supple. No JVD present. No tracheal deviation present. No thyromegaly present.  Cardiovascular: Normal rate, regular rhythm, normal heart sounds and intact distal pulses.  Exam reveals no gallop and no friction rub.   No murmur heard. Pulmonary/Chest: Breath sounds normal. No respiratory distress. He has no wheezes. He has no rales.  Abdominal: Soft. Bowel sounds are normal. He exhibits no mass. There is no hepatosplenomegaly. There is no tenderness. There is no rebound, no guarding and no CVA tenderness.  Musculoskeletal: Normal range of motion. He exhibits no edema or tenderness.  Lymphadenopathy:    He has no cervical adenopathy.  Neurological: He is alert and oriented to person, place, and time. He has normal sensation, normal strength and intact cranial nerves. No cranial nerve deficit.  Skin: Skin is warm. No rash noted.  Psychiatric: Mood and affect normal.  Nursing note and  vitals reviewed.     Assessment & Plan  Problem List Items Addressed This Visit      Cardiovascular and Mediastinum   HTN (hypertension) - Primary   Relevant Medications   sildenafil (VIAGRA) 50 MG tablet   metoprolol tartrate (LOPRESSOR) 25 MG tablet   losartan (COZAAR) 50 MG tablet   atorvastatin (LIPITOR) 40 MG tablet    Other Relevant Orders   Renal Function Panel     Endocrine   Type 2 diabetes mellitus ([facility])   Relevant Medications   metFORMIN (GLUCOPHAGE) 500 MG tablet   losartan (COZAAR) 50 MG tablet   glipiZIDE (GLUCOTROL) 5 MG tablet   glucose blood (FREESTYLE TEST STRIPS) test strip   atorvastatin (LIPITOR) 40 MG tablet   Other Relevant Orders   Hemoglobin A1c    Other Visit Diagnoses    Hyperlipidemia associated with type 2 diabetes mellitus ([facility])       Relevant Medications   sildenafil (VIAGRA) 50 MG tablet   metoprolol tartrate (LOPRESSOR) 25 MG tablet   metFORMIN (GLUCOPHAGE) 500 MG tablet   losartan (COZAAR) 50 MG tablet   glipiZIDE (GLUCOTROL) 5 MG tablet   atorvastatin (LIPITOR) 40 MG tablet   Other Relevant Orders   Lipid Profile   Erectile dysfunction, unspecified erectile dysfunction type       Relevant Medications   sildenafil (VIAGRA) 50 MG tablet        Dr. Bethene Hankinson [facility]  05/06/16

## 2016-05-07 LAB — RENAL FUNCTION PANEL
Albumin: 4.6 g/dL (ref 3.5–4.8)
BUN/Creatinine Ratio: 18 (ref 10–24)
BUN: 26 mg/dL (ref 8–27)
CO2: 22 mmol/L (ref 18–29)
CREATININE: 1.48 mg/dL — AB (ref 0.76–1.27)
Calcium: 9.6 mg/dL (ref 8.6–10.2)
Chloride: 97 mmol/L (ref 96–106)
GFR, EST AFRICAN AMERICAN: 55 mL/min/{1.73_m2} — AB (ref >59)
GFR, EST NON AFRICAN AMERICAN: 47 mL/min/{1.73_m2} — AB (ref >59)
GLUCOSE: 194 mg/dL — AB (ref 65–99)
POTASSIUM: 5 mmol/L (ref 3.5–5.2)
Phosphorus: 3.3 mg/dL (ref 2.5–4.5)
SODIUM: 138 mmol/L (ref 134–144)

## 2016-05-07 LAB — LIPID PANEL
CHOL/HDL RATIO: 4.3 ratio (ref 0.0–5.0)
Cholesterol, Total: 124 mg/dL (ref 100–199)
HDL: 29 mg/dL — AB (ref >39)
LDL CALC: 34 mg/dL (ref 0–99)
TRIGLYCERIDES: 306 mg/dL — AB (ref 0–149)
VLDL CHOLESTEROL CAL: 61 mg/dL — AB (ref 5–40)

## 2016-05-17 ENCOUNTER — Other Ambulatory Visit: Payer: Medicare Other

## 2016-05-17 DIAGNOSIS — E119 Type 2 diabetes mellitus without complications: Secondary | ICD-10-CM

## 2016-05-18 LAB — HEMOGLOBIN A1C
ESTIMATED AVERAGE GLUCOSE: 163 mg/dL
Hgb A1c MFr Bld: 7.3 % — ABNORMAL HIGH (ref 4.8–5.6)

## 2016-05-20 ENCOUNTER — Other Ambulatory Visit: Payer: Self-pay

## 2016-06-07 ENCOUNTER — Encounter: Payer: Self-pay | Admitting: Hematology and Oncology

## 2016-06-07 DIAGNOSIS — D631 Anemia in chronic kidney disease: Secondary | ICD-10-CM | POA: Diagnosis not present

## 2016-06-07 DIAGNOSIS — I10 Essential (primary) hypertension: Secondary | ICD-10-CM | POA: Diagnosis not present

## 2016-06-07 DIAGNOSIS — E1122 Type 2 diabetes mellitus with diabetic chronic kidney disease: Secondary | ICD-10-CM | POA: Diagnosis not present

## 2016-06-07 DIAGNOSIS — N183 Chronic kidney disease, stage 3 (moderate): Secondary | ICD-10-CM | POA: Diagnosis not present

## 2016-08-29 DIAGNOSIS — I25810 Atherosclerosis of coronary artery bypass graft(s) without angina pectoris: Secondary | ICD-10-CM | POA: Diagnosis not present

## 2016-08-29 DIAGNOSIS — I10 Essential (primary) hypertension: Secondary | ICD-10-CM | POA: Diagnosis not present

## 2016-08-29 DIAGNOSIS — I2510 Atherosclerotic heart disease of native coronary artery without angina pectoris: Secondary | ICD-10-CM | POA: Diagnosis not present

## 2016-10-11 DIAGNOSIS — E1122 Type 2 diabetes mellitus with diabetic chronic kidney disease: Secondary | ICD-10-CM | POA: Diagnosis not present

## 2016-10-11 DIAGNOSIS — D631 Anemia in chronic kidney disease: Secondary | ICD-10-CM | POA: Diagnosis not present

## 2016-10-11 DIAGNOSIS — N183 Chronic kidney disease, stage 3 (moderate): Secondary | ICD-10-CM | POA: Diagnosis not present

## 2016-10-11 DIAGNOSIS — I10 Essential (primary) hypertension: Secondary | ICD-10-CM | POA: Diagnosis not present

## 2016-10-25 ENCOUNTER — Other Ambulatory Visit: Payer: Self-pay

## 2016-11-06 DIAGNOSIS — Z872 Personal history of diseases of the skin and subcutaneous tissue: Secondary | ICD-10-CM | POA: Diagnosis not present

## 2016-11-06 DIAGNOSIS — L570 Actinic keratosis: Secondary | ICD-10-CM | POA: Diagnosis not present

## 2016-12-21 ENCOUNTER — Other Ambulatory Visit: Payer: Self-pay | Admitting: Family Medicine

## 2017-01-16 ENCOUNTER — Other Ambulatory Visit: Payer: Self-pay | Admitting: Family Medicine

## 2017-01-16 DIAGNOSIS — I10 Essential (primary) hypertension: Secondary | ICD-10-CM

## 2017-01-20 DIAGNOSIS — E119 Type 2 diabetes mellitus without complications: Secondary | ICD-10-CM | POA: Diagnosis not present

## 2017-01-20 DIAGNOSIS — H2513 Age-related nuclear cataract, bilateral: Secondary | ICD-10-CM | POA: Diagnosis not present

## 2017-01-31 ENCOUNTER — Other Ambulatory Visit: Payer: Self-pay | Admitting: Family Medicine

## 2017-02-13 DIAGNOSIS — D631 Anemia in chronic kidney disease: Secondary | ICD-10-CM | POA: Diagnosis not present

## 2017-02-13 DIAGNOSIS — N183 Chronic kidney disease, stage 3 (moderate): Secondary | ICD-10-CM | POA: Diagnosis not present

## 2017-02-13 DIAGNOSIS — E1122 Type 2 diabetes mellitus with diabetic chronic kidney disease: Secondary | ICD-10-CM | POA: Diagnosis not present

## 2017-02-13 DIAGNOSIS — I10 Essential (primary) hypertension: Secondary | ICD-10-CM | POA: Diagnosis not present

## 2017-02-21 DIAGNOSIS — I2510 Atherosclerotic heart disease of native coronary artery without angina pectoris: Secondary | ICD-10-CM | POA: Diagnosis not present

## 2017-02-21 DIAGNOSIS — I10 Essential (primary) hypertension: Secondary | ICD-10-CM | POA: Diagnosis not present

## 2017-02-21 DIAGNOSIS — I25810 Atherosclerosis of coronary artery bypass graft(s) without angina pectoris: Secondary | ICD-10-CM | POA: Diagnosis not present

## 2017-02-27 IMAGING — CR DG CHEST 2V
2 series · 2 of 2 positions shown · non-contrast
Comparison: 02/04/2012

CLINICAL DATA: Anterior chest pressure this morning.

EXAM:
CHEST  2 VIEW

[chest pa]
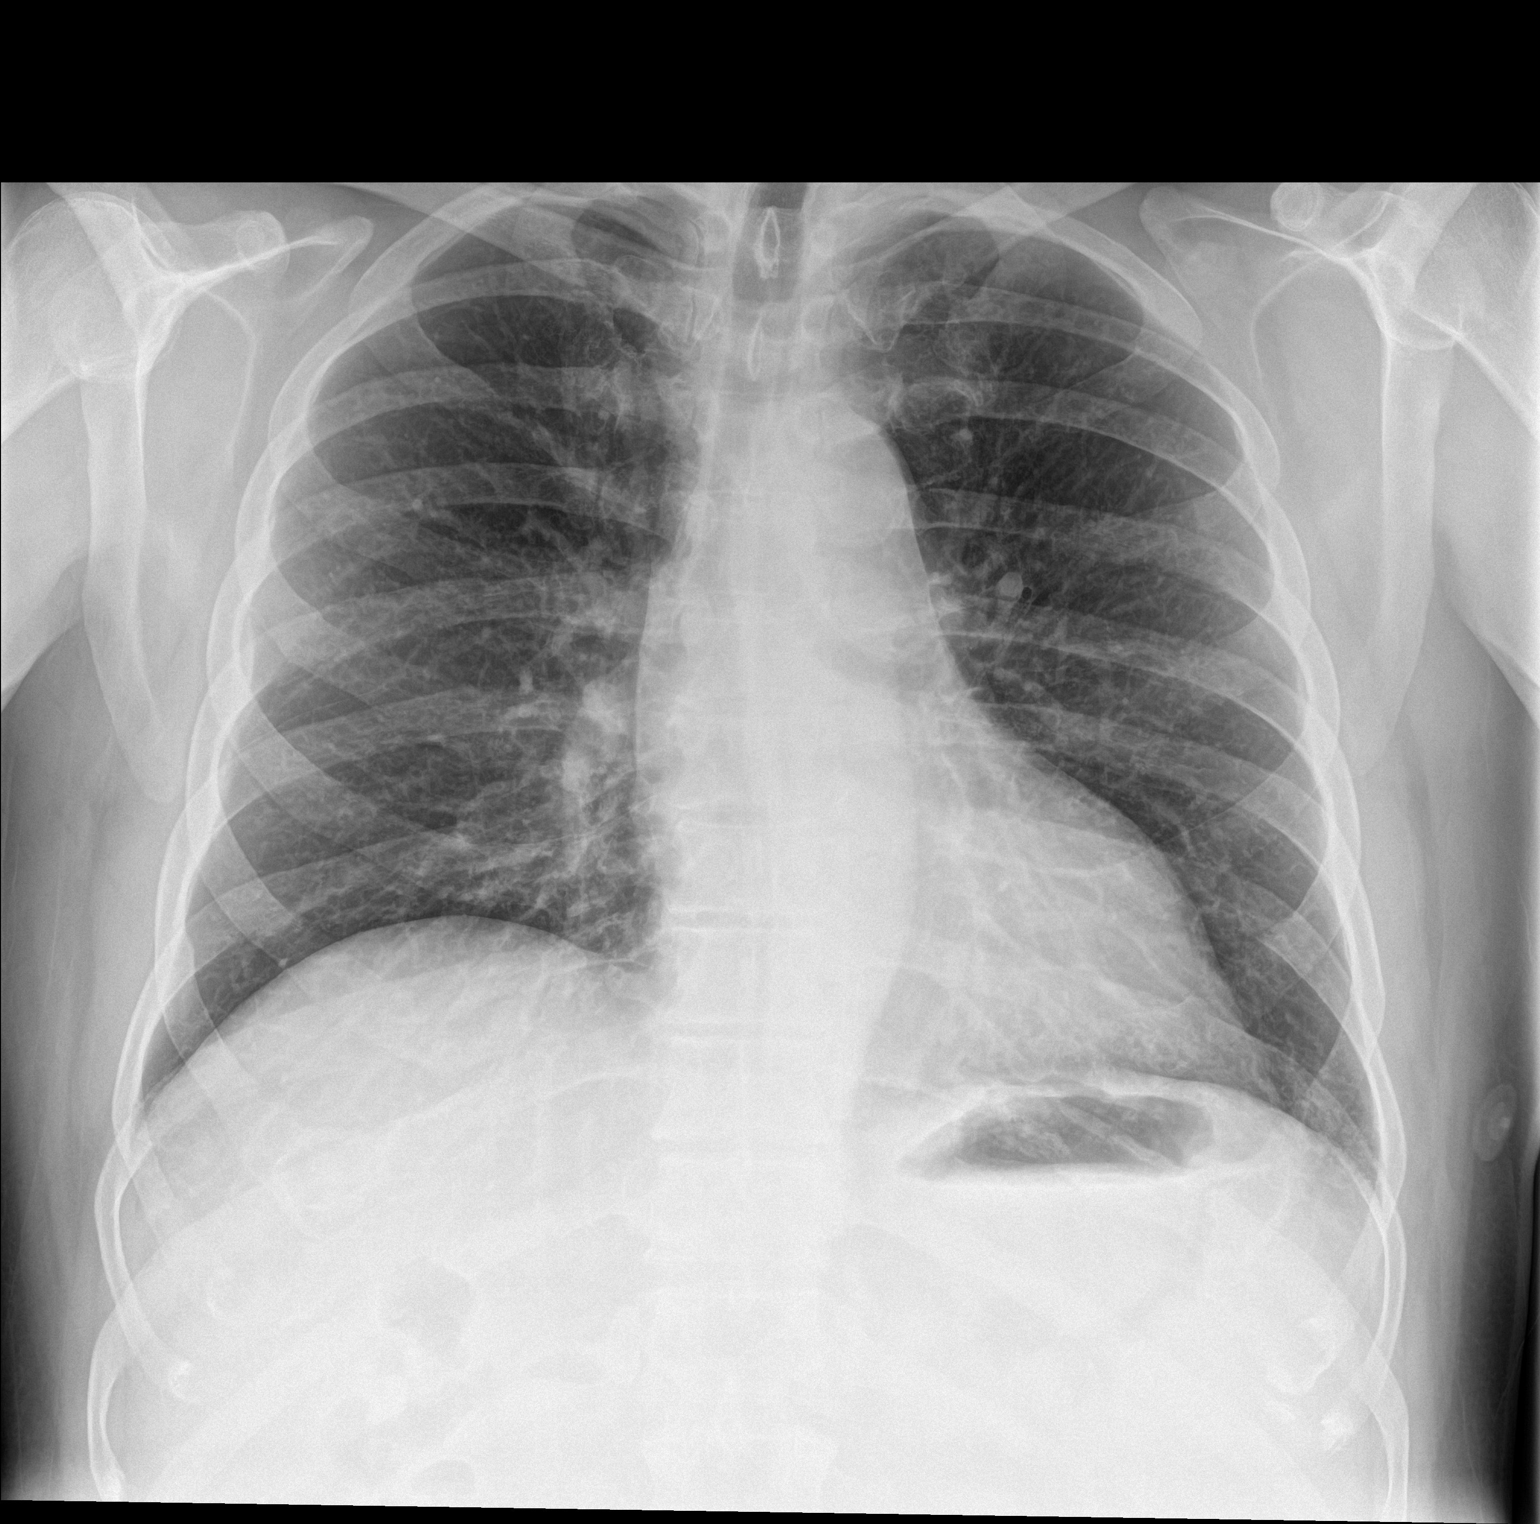

[chest lat]
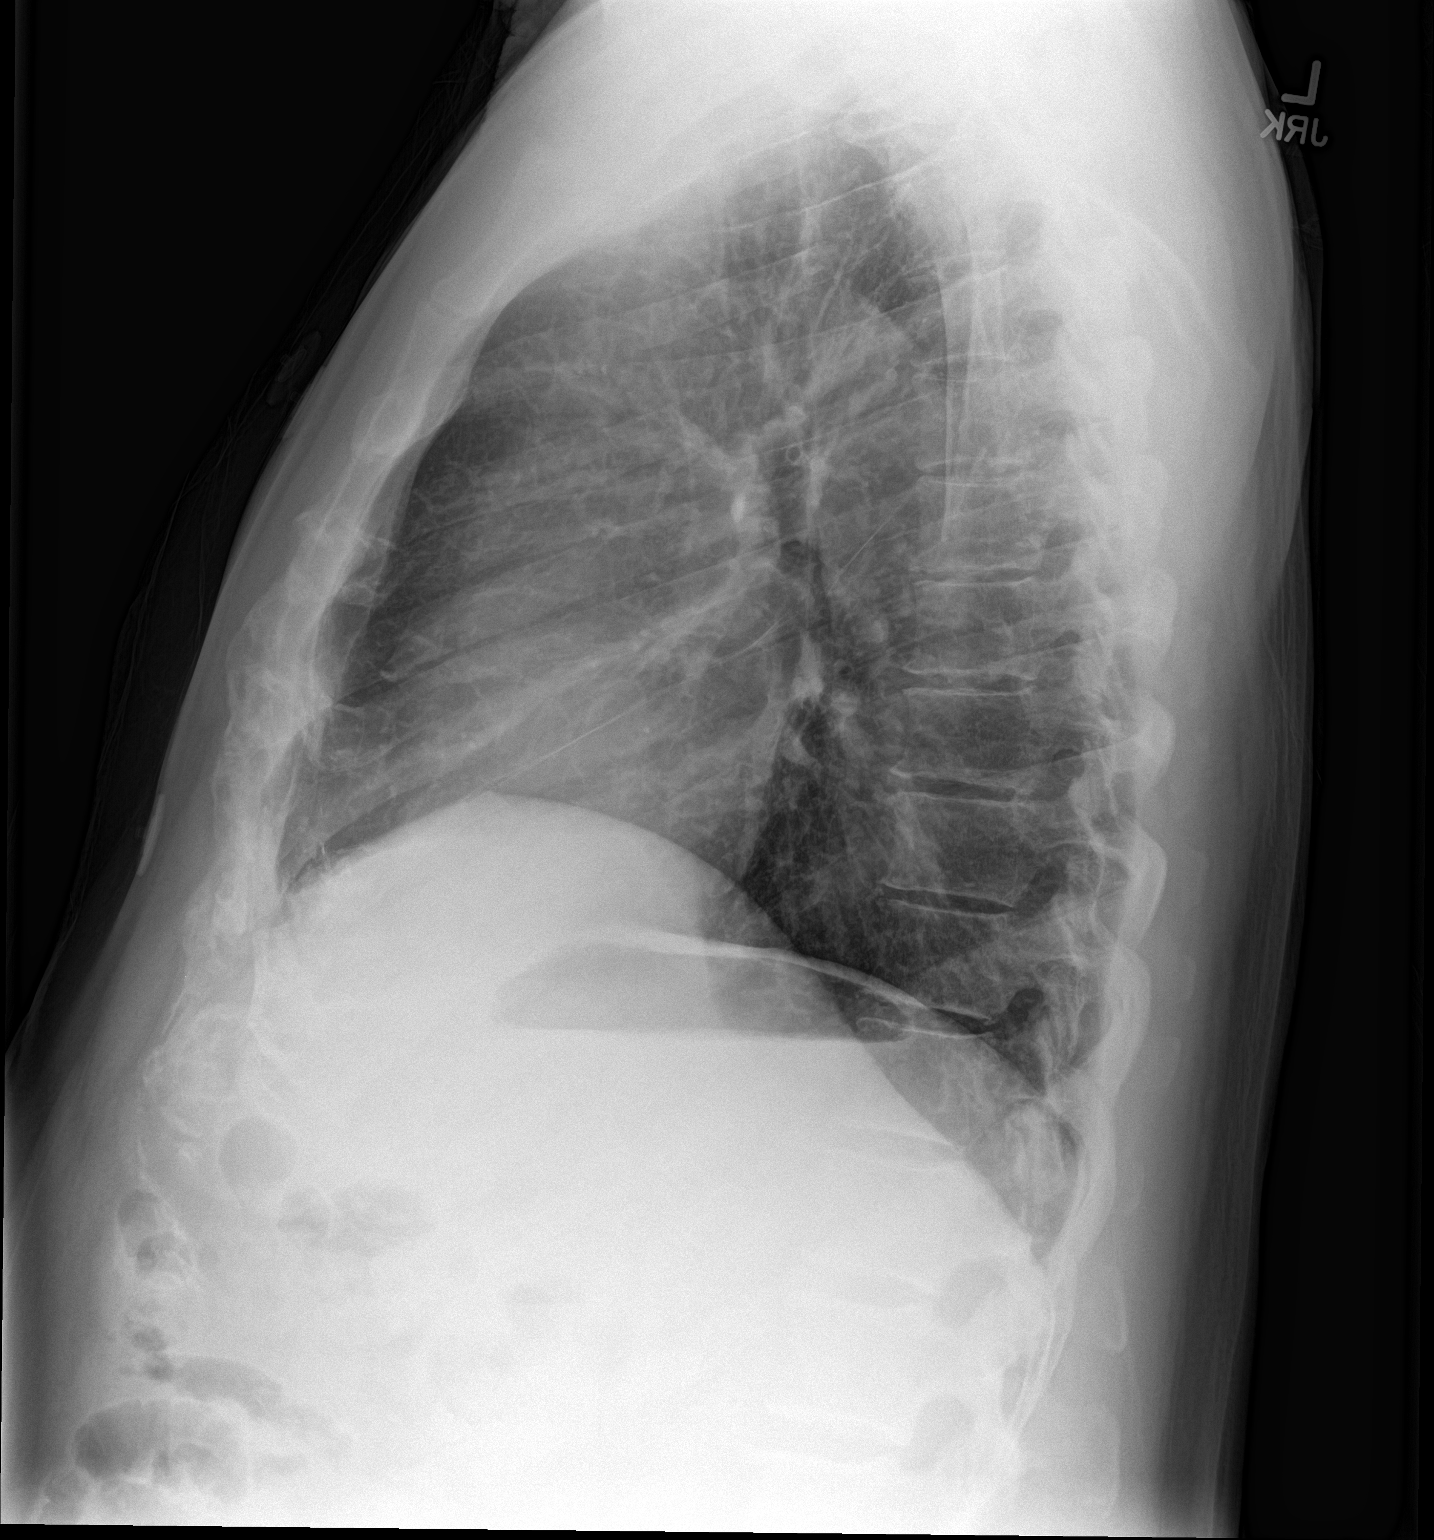

[2 of 2 positions shown; findings below may reference images not displayed]

FINDINGS: The cardiomediastinal contours are normal. Mild interstitial
thickening. Pulmonary vasculature is normal. No consolidation,
pleural effusion, or pneumothorax. No acute osseous abnormalities
are seen.
IMPRESSION: Mild interstitial thickening, may reflect atypical infection.

## 2017-03-03 DIAGNOSIS — Z23 Encounter for immunization: Secondary | ICD-10-CM | POA: Diagnosis not present

## 2017-03-04 IMAGING — CR DG CHEST 1V PORT
1 series · 1 of 1 positions shown · non-contrast
Comparison: 04/21/2015

CLINICAL DATA: Status post CABG surgery.  Subsequent encounter.

EXAM:
PORTABLE CHEST 1 VIEW

[AP]
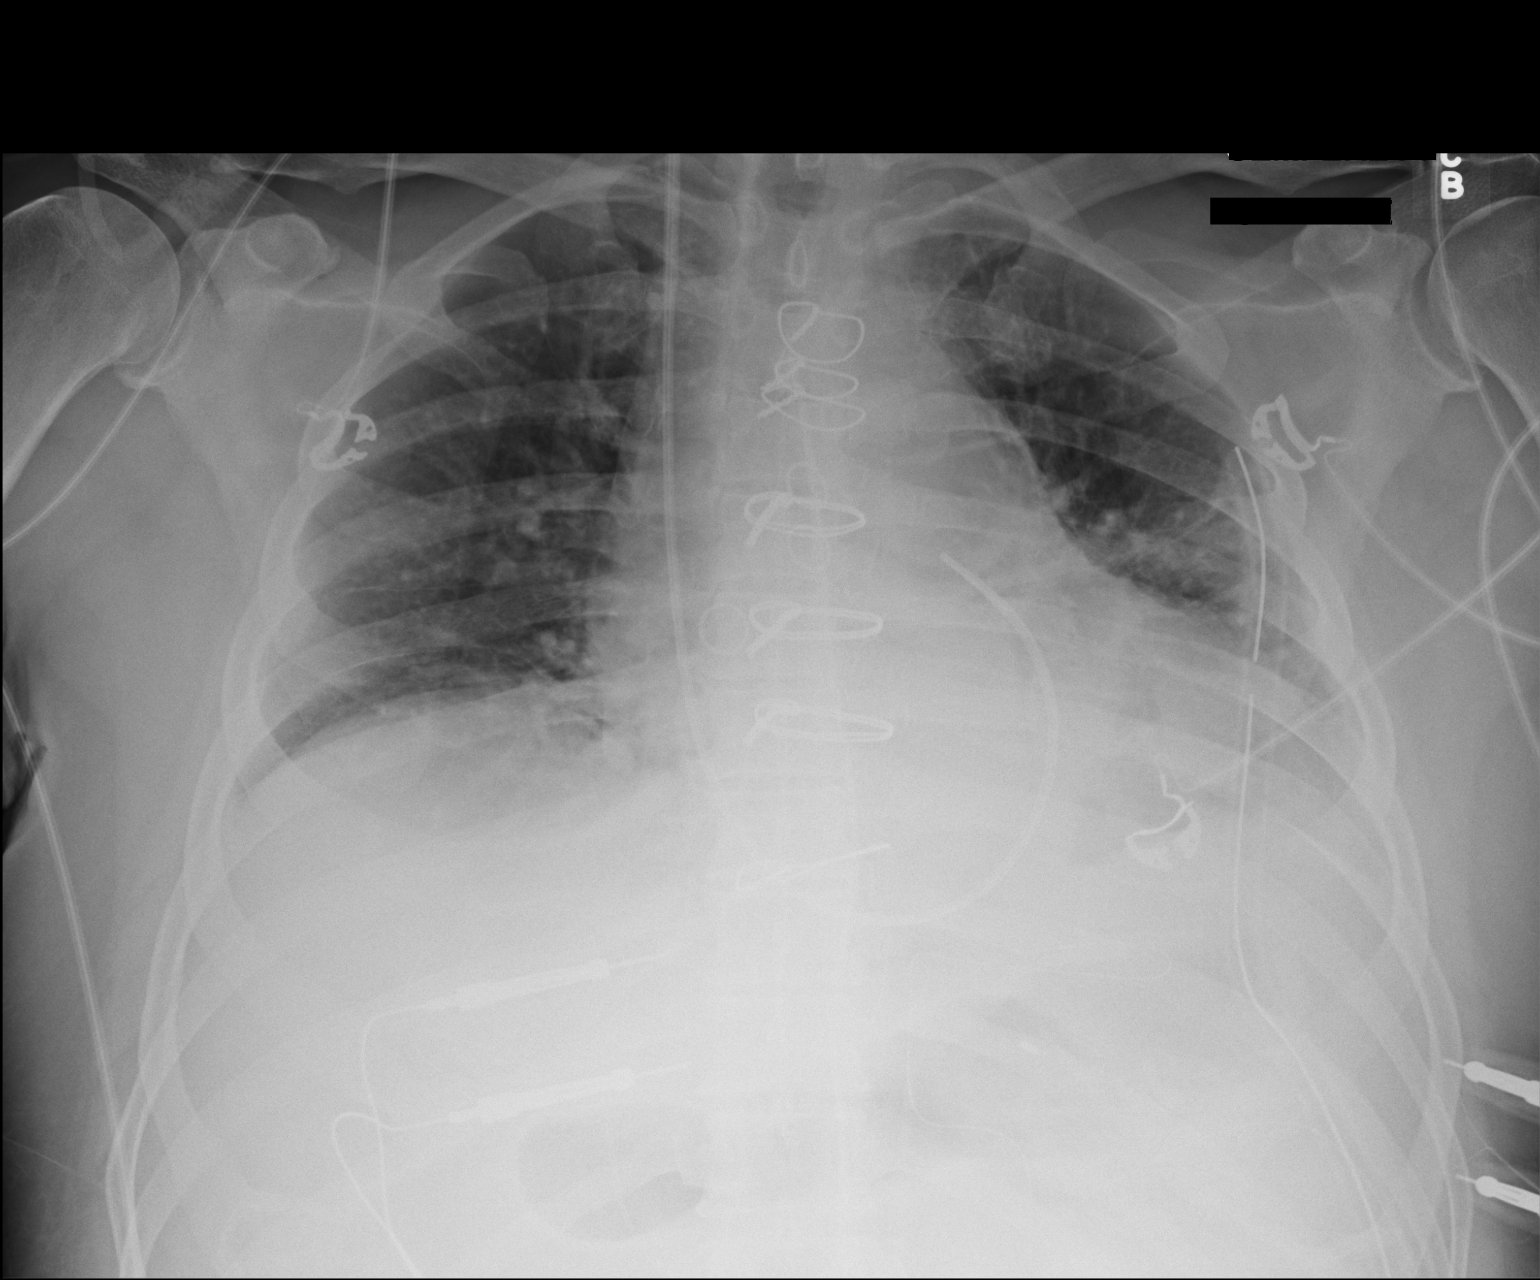

[1 of 1 positions shown; findings below may reference images not displayed]

FINDINGS: Since prior study, the endotracheal tube and nasogastric tube have
been removed.

Lung volumes are lower than they were on the prior study. Mild left
mid and bilateral basilar atelectasis is similar. There is no
convincing pneumonia or evidence of pulmonary edema. No
pneumothorax.

There is no mediastinal widening.

Right internal jugular Swan-Ganz catheter, mediastinal tube and left
chest tube are stable.
IMPRESSION: 1. No change in the mild left mid and bilateral lung base
atelectasis. No evidence of pneumonia or pulmonary edema. No
mediastinal widening or pneumothorax.
2. Status post extubation and removal of the orogastric tube no
other change.

## 2017-03-06 IMAGING — DX DG CHEST 2V
2 series · 2 of 2 positions shown · non-contrast
Comparison: Yesterday

CLINICAL DATA: Status post CABG

EXAM:
CHEST  2 VIEW

[chest pa]
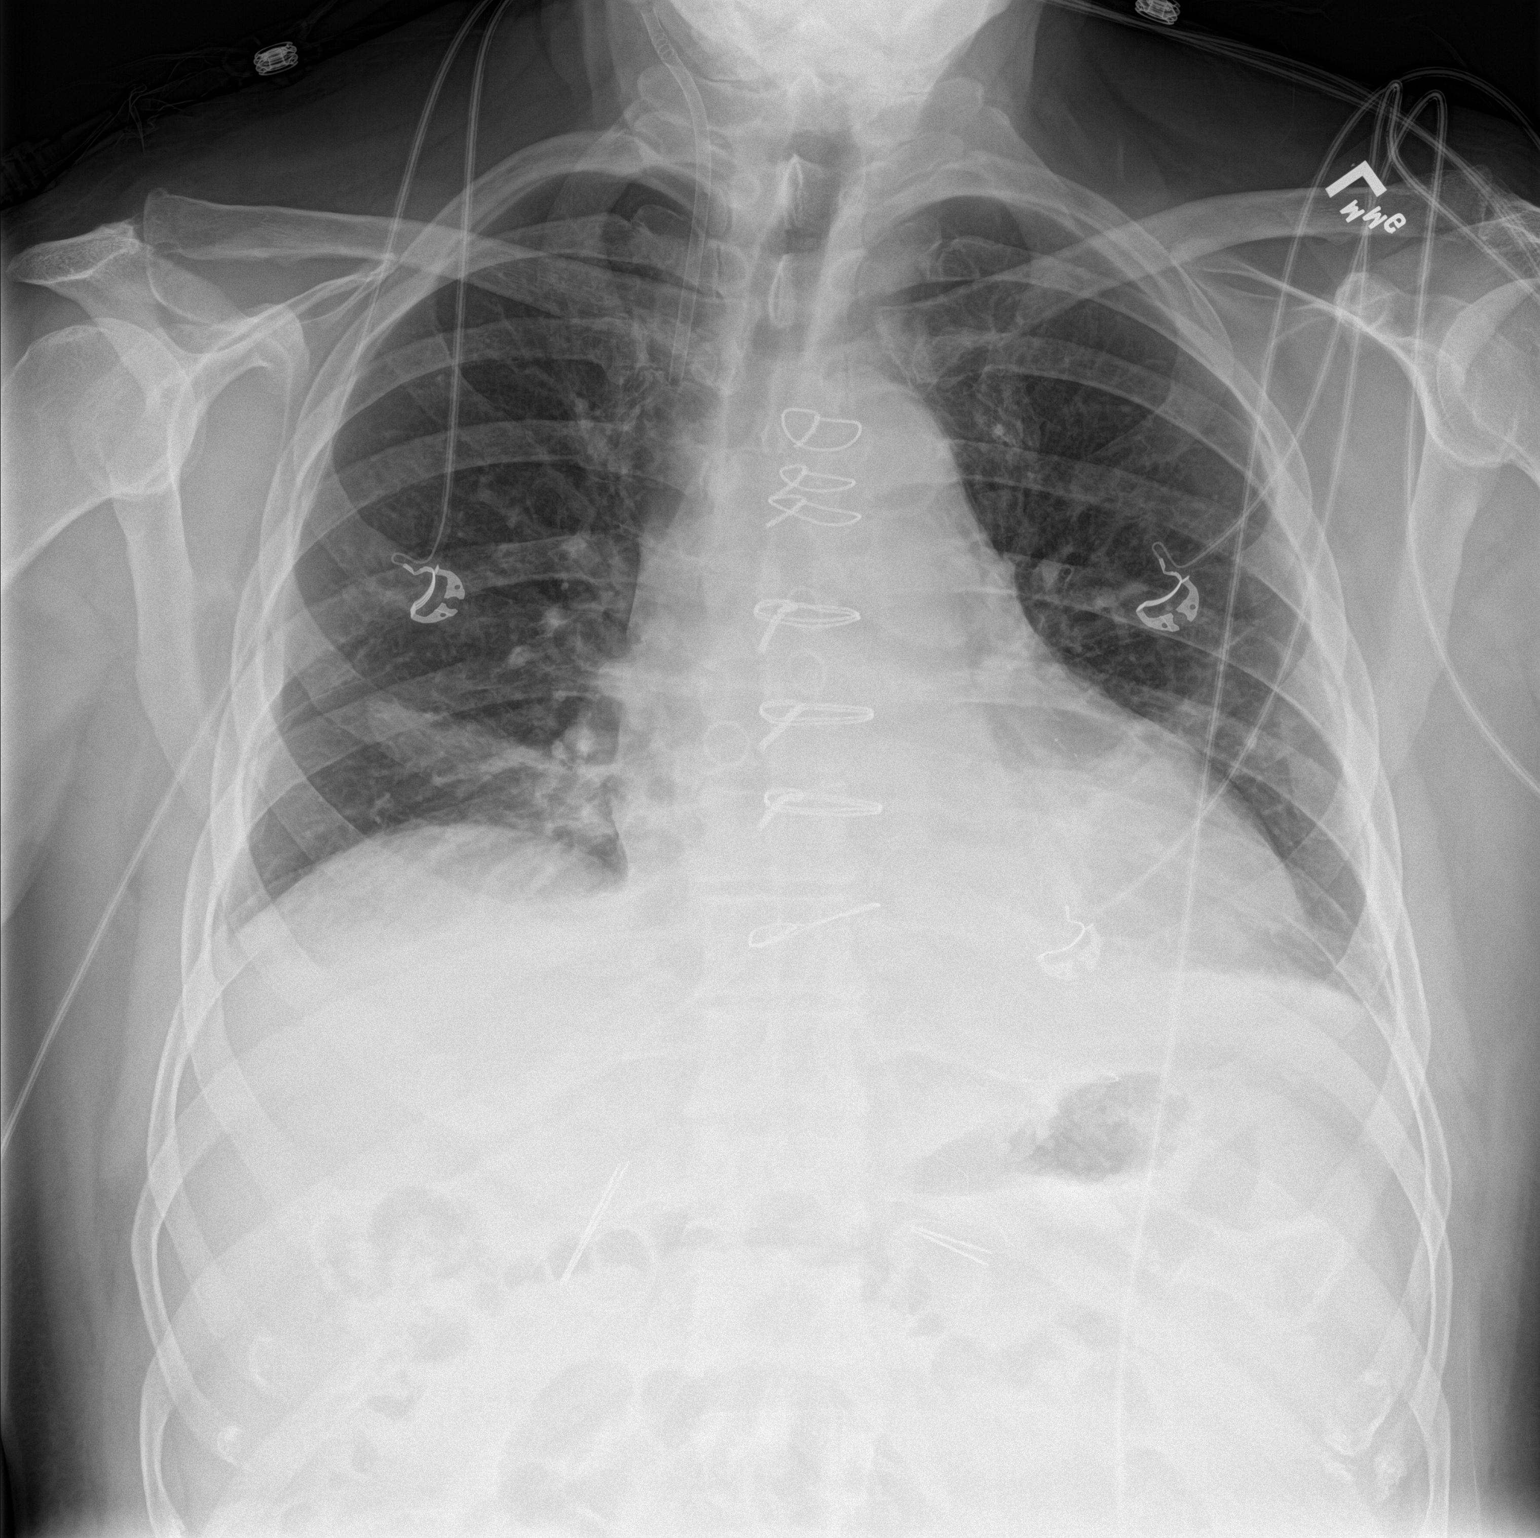

[chest lat]
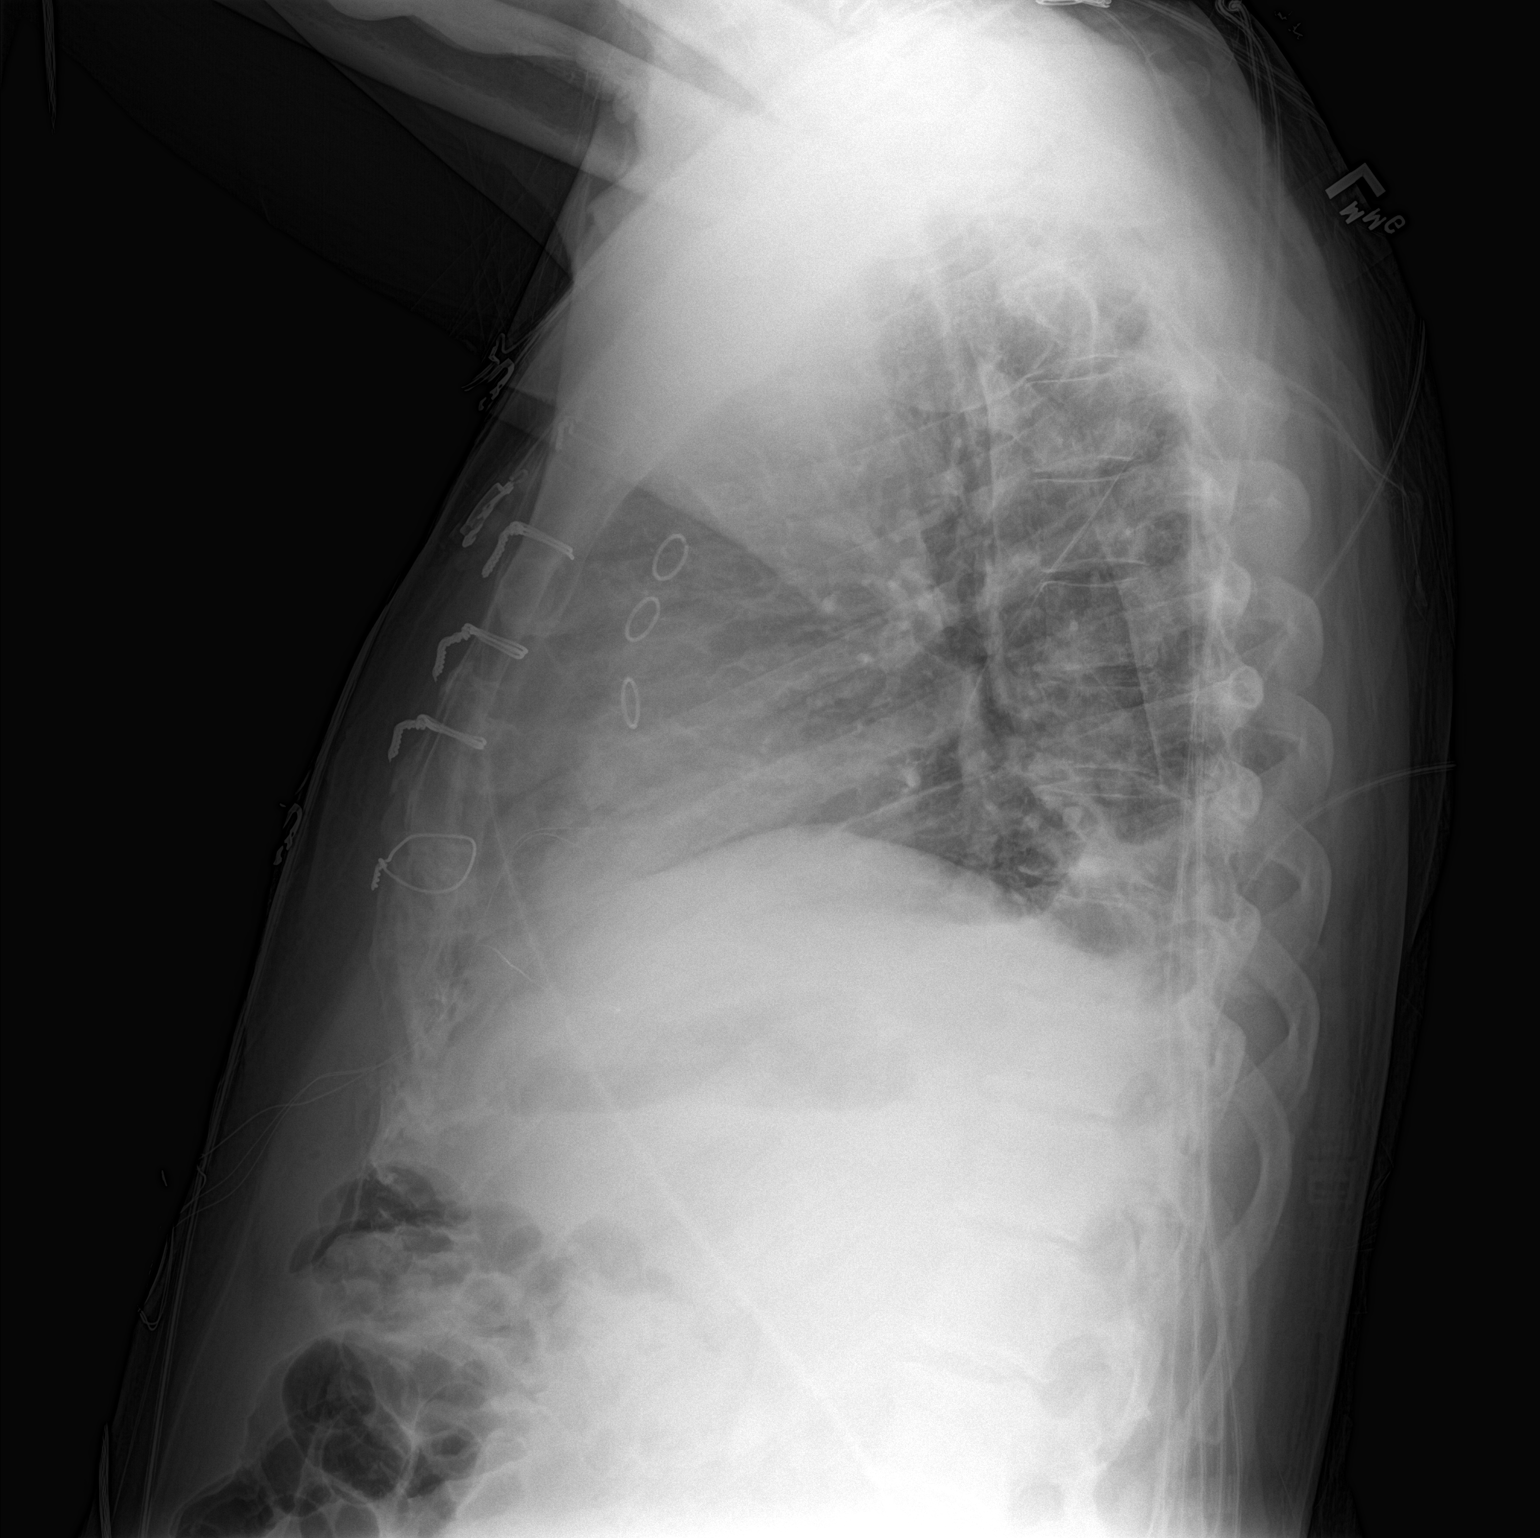

[2 of 2 positions shown; findings below may reference images not displayed]

FINDINGS: Stable cardiomediastinal size and contour after recent CABG. Lung
volumes remain low and there is stable bibasilar atelectasis,
greatest medially on the left. Trace effusions. No pneumothorax. No
pulmonary edema. Remaining right IJ sheath in stable position.
IMPRESSION: Stable postoperative atelectasis.

## 2017-03-11 ENCOUNTER — Other Ambulatory Visit: Payer: Self-pay | Admitting: Family Medicine

## 2017-03-11 DIAGNOSIS — I10 Essential (primary) hypertension: Secondary | ICD-10-CM

## 2017-03-25 ENCOUNTER — Other Ambulatory Visit: Payer: Self-pay

## 2017-03-26 DIAGNOSIS — H2513 Age-related nuclear cataract, bilateral: Secondary | ICD-10-CM | POA: Diagnosis not present

## 2017-03-26 DIAGNOSIS — E119 Type 2 diabetes mellitus without complications: Secondary | ICD-10-CM | POA: Diagnosis not present

## 2017-04-14 ENCOUNTER — Other Ambulatory Visit: Payer: Self-pay

## 2017-04-24 ENCOUNTER — Other Ambulatory Visit: Payer: Self-pay

## 2017-05-03 ENCOUNTER — Other Ambulatory Visit: Payer: Self-pay | Admitting: Family Medicine

## 2017-05-03 DIAGNOSIS — I10 Essential (primary) hypertension: Secondary | ICD-10-CM

## 2017-05-08 ENCOUNTER — Ambulatory Visit (INDEPENDENT_AMBULATORY_CARE_PROVIDER_SITE_OTHER): Payer: Medicare Other | Admitting: Family Medicine

## 2017-05-08 ENCOUNTER — Encounter: Payer: Self-pay | Admitting: Family Medicine

## 2017-05-08 VITALS — BP 130/70 | HR 68 | Ht 70.0 in | Wt 210.0 lb

## 2017-05-08 DIAGNOSIS — Z1159 Encounter for screening for other viral diseases: Secondary | ICD-10-CM | POA: Diagnosis not present

## 2017-05-08 DIAGNOSIS — E6609 Other obesity due to excess calories: Secondary | ICD-10-CM | POA: Diagnosis not present

## 2017-05-08 DIAGNOSIS — Z6830 Body mass index (BMI) 30.0-30.9, adult: Secondary | ICD-10-CM

## 2017-05-08 DIAGNOSIS — E1159 Type 2 diabetes mellitus with other circulatory complications: Secondary | ICD-10-CM | POA: Diagnosis not present

## 2017-05-08 DIAGNOSIS — N529 Male erectile dysfunction, unspecified: Secondary | ICD-10-CM | POA: Diagnosis not present

## 2017-05-08 DIAGNOSIS — E1169 Type 2 diabetes mellitus with other specified complication: Secondary | ICD-10-CM | POA: Diagnosis not present

## 2017-05-08 DIAGNOSIS — R69 Illness, unspecified: Secondary | ICD-10-CM

## 2017-05-08 DIAGNOSIS — I10 Essential (primary) hypertension: Secondary | ICD-10-CM

## 2017-05-08 DIAGNOSIS — E785 Hyperlipidemia, unspecified: Secondary | ICD-10-CM

## 2017-05-08 DIAGNOSIS — J069 Acute upper respiratory infection, unspecified: Secondary | ICD-10-CM

## 2017-05-08 DIAGNOSIS — Z23 Encounter for immunization: Secondary | ICD-10-CM | POA: Diagnosis not present

## 2017-05-08 MED ORDER — LOSARTAN POTASSIUM 50 MG PO TABS: 50.0000 mg | ORAL_TABLET | Freq: Every day | ORAL | 1 refills | Status: DC

## 2017-05-08 MED ORDER — ATORVASTATIN CALCIUM 40 MG PO TABS: 40.0000 mg | ORAL_TABLET | Freq: Every day | ORAL | 1 refills | Status: DC

## 2017-05-08 MED ORDER — SILDENAFIL CITRATE 50 MG PO TABS: 50.0000 mg | ORAL_TABLET | ORAL | 11 refills | Status: DC | PRN

## 2017-05-08 MED ORDER — GLIPIZIDE 5 MG PO TABS: 5.0000 mg | ORAL_TABLET | Freq: Two times a day (BID) | ORAL | 1 refills | Status: DC

## 2017-05-08 MED ORDER — METFORMIN HCL 500 MG PO TABS: 500.0000 mg | ORAL_TABLET | Freq: Two times a day (BID) | ORAL | 1 refills | Status: DC

## 2017-05-08 NOTE — Patient Instructions (Signed)

## 2017-05-08 NOTE — Progress Notes (Signed)
Name: [name]   MRN: [id number]    DOB: 1946-04-08   Date:05/08/2017       Progress Note  Subjective  Chief Complaint  Chief Complaint  Patient presents with  . Diabetes  . Hyperlipidemia  . Hypertension  . Erectile Dysfunction    Diabetes  He presents for his follow-up diabetic visit. He has type 2 diabetes mellitus. His disease course has been stable. There are no hypoglycemic associated symptoms. Pertinent negatives for hypoglycemia include no dizziness, headaches or nervousness/anxiousness. Pertinent negatives for diabetes include no blurred vision, no chest pain, no fatigue, no foot paresthesias, no foot ulcerations, no polydipsia, no polyphagia, no polyuria, no visual change, no weakness and no weight loss. There are no hypoglycemic complications. Symptoms are stable. There are no diabetic complications. Pertinent negatives for diabetic complications include no CVA, PVD or retinopathy. Risk factors for coronary artery disease include dyslipidemia, diabetes mellitus, hypertension, male sex and post-menopausal. Current diabetic treatment includes oral agent (dual therapy) (glip/met). He is following a generally healthy diet. He participates in exercise intermittently (walk some). His home blood glucose trend is fluctuating minimally. His breakfast blood glucose is taken between 8-9 am. His breakfast blood glucose range is generally 110-130 mg/dl. An ACE inhibitor/angiotensin II receptor blocker is being taken. He does not see a podiatrist.Eye exam is current.  Hypertension  This is a chronic problem. The current episode started more than 1 year ago. The problem is unchanged. The problem is controlled. Pertinent negatives include no blurred vision, chest pain, headaches, malaise/fatigue, neck pain, palpitations or shortness of breath. There are no associated agents to hypertension. Risk factors for coronary artery disease include diabetes mellitus, obesity and dyslipidemia. Past  treatments include angiotensin blockers. The current treatment provides mild improvement. Hypertensive end-organ damage includes CAD/MI. There is no history of angina, kidney disease, CVA, heart failure, left ventricular hypertrophy, PVD or retinopathy. There is no history of chronic renal disease, a hypertension causing med or renovascular disease.  Hyperlipidemia  This is a chronic problem. The problem is controlled. Recent lipid tests were reviewed and are normal. He has no history of chronic renal disease. Pertinent negatives include no chest pain, focal weakness, myalgias or shortness of breath. Current antihyperlipidemic treatment includes statins. The current treatment provides no improvement of lipids. There are no compliance problems.  Risk factors for coronary artery disease include dyslipidemia, hypertension, diabetes mellitus and post-menopausal.  URI   This is a new problem. The current episode started 1 to 4 weeks ago (10 days). The problem has been waxing and waning. There has been no fever. Associated symptoms include congestion, coughing, rhinorrhea, sneezing and a sore throat. Pertinent negatives include no abdominal pain, chest pain, diarrhea, dysuria, headaches, joint pain, nausea, neck pain, rash, sinus pain, swollen glands, vomiting or wheezing. He has tried increased fluids and acetaminophen for the symptoms. The treatment provided moderate relief.    No problem-specific Assessment & Plan notes found for this encounter.   Past Medical History:  Diagnosis Date  . Coronary artery disease   . Hyperlipidemia   . Hypertension   . Type II diabetes mellitus ([place])     Past Surgical History:  Procedure Laterality Date  . CARDIAC CATHETERIZATION N/A 04/18/2015   Procedure: Left Heart Cath and Coronary Angiography;  Surgeon: Yolonda Kida, MD;  Location: [facility];  Service: Cardiovascular;  Laterality: N/A;  . COLONOSCOPY  2014   cleared for 10 yrs  . CORONARY  ARTERY BYPASS GRAFT N/A 04/21/2015  Procedure: CORONARY ARTERY BYPASS GRAFTING (CABG) X 4 UTILIZING THE LEFT INTERNAL MAMMARY ARTERY TO LAD, ENDOSCOPICALLY HARVESTED RIGHT SAPHENEOUS VEINS TO PD,DIAGONAL AND CIRCUMFLEX.;  Surgeon: Grace Isaac, MD;  Location: [facility];  Service: Open Heart Surgery;  Laterality: N/A;  . DUPUYTREN CONTRACTURE RELEASE Left ~ 2000  . KNEE ARTHROSCOPY Right ~ 1995  . TEE WITHOUT CARDIOVERSION N/A 04/21/2015   Procedure: TRANSESOPHAGEAL ECHOCARDIOGRAM (TEE);  Surgeon: Grace Isaac, MD;  Location: [facility];  Service: Open Heart Surgery;  Laterality: N/A;    Family History  Problem Relation Age of Onset  . Diabetes Mother   . Heart attack Unknown   . Hypertension Unknown     Social History   Socioeconomic History  . Marital status: Married    Spouse name: Not on file  . Number of children: Not on file  . Years of education: Not on file  . Highest education level: Not on file  Social Needs  . Financial resource strain: Not on file  . Food insecurity - worry: Not on file  . Food insecurity - inability: Not on file  . Transportation needs - medical: Not on file  . Transportation needs - non-medical: Not on file  Occupational History  . Not on file  Tobacco Use  . Smoking status: Never Smoker  . Smokeless tobacco: Never Used  Substance and Sexual Activity  . Alcohol use: Yes    Alcohol/week: 0.0 oz    Comment: 04/19/2015 "might have a drink a couple times/yr; at most"  . Drug use: No  . Sexual activity: Not Currently  Other Topics Concern  . Not on file  Social History Narrative  . Not on file    No Known Allergies  Outpatient Medications Prior to Visit  Medication Sig Dispense Refill  . aspirin 81 MG tablet Take 81 mg by mouth daily.    Marland Kitchen glucose blood (FREESTYLE TEST STRIPS) test strip 1 each by Other route as needed for other. Use as instructed 50 each 11  . atorvastatin (LIPITOR) 40 MG tablet Take 1 tablet (40 mg total) by mouth daily.  90 tablet 1  . glipiZIDE (GLUCOTROL) 5 MG tablet Take 1 tablet (5 mg total) by mouth 2 (two) times daily before a meal. 180 tablet 1  . losartan (COZAAR) 50 MG tablet TAKE 1 TABLET BY MOUTH EVERY DAY 7 tablet 0  . metFORMIN (GLUCOPHAGE) 500 MG tablet Take 1 tablet (500 mg total) by mouth 2 (two) times daily with a meal. 180 tablet 1  . sildenafil (VIAGRA) 50 MG tablet Take 1 tablet (50 mg total) by mouth as needed. 7 tablet 11  . atorvastatin (LIPITOR) 40 MG tablet TAKE 1 TABLET (40 MG TOTAL) BY MOUTH DAILY. 90 tablet 1  . metoprolol tartrate (LOPRESSOR) 25 MG tablet Take 0.5 tablets (12.5 mg total) by mouth 2 (two) times daily. 90 tablet 1  . metoprolol tartrate (LOPRESSOR) 25 MG tablet TAKE 0.5 TABLETS (12.5 MG TOTAL) BY MOUTH 2 (TWO) TIMES DAILY. 30 tablet 0   No facility-administered medications prior to visit.     Review of Systems  Constitutional: Negative for chills, fatigue, fever, malaise/fatigue and weight loss.  HENT: Positive for congestion, rhinorrhea, sneezing and sore throat. Negative for ear discharge and sinus pain.   Eyes: Negative for blurred vision.  Respiratory: Positive for cough. Negative for sputum production, shortness of breath and wheezing.   Cardiovascular: Negative for chest pain, palpitations and leg swelling.  Gastrointestinal: Negative for abdominal pain, blood  in stool, constipation, diarrhea, heartburn, melena, nausea and vomiting.  Genitourinary: Negative for dysuria, frequency, hematuria and urgency.  Musculoskeletal: Negative for back pain, joint pain, myalgias and neck pain.  Skin: Negative for rash.  Neurological: Negative for dizziness, tingling, sensory change, focal weakness, weakness and headaches.  Endo/Heme/Allergies: Negative for environmental allergies, polydipsia and polyphagia. Does not bruise/bleed easily.  Psychiatric/Behavioral: Negative for depression and suicidal ideas. The patient is not nervous/anxious and does not have insomnia.       Objective  Vitals:   05/08/17 0814  BP: 130/70  Pulse: 68  Weight: 210 lb (95.3 kg)  Height: _0  (1.778 m)    Physical Exam  Constitutional: He is oriented to person, place, and time and well-developed, well-nourished, and in no distress.  HENT:  Head: Normocephalic.  Right Ear: External ear normal.  Left Ear: External ear normal.  Nose: Nose normal.  Mouth/Throat: Oropharynx is clear and moist.  Eyes: Conjunctivae and EOM are normal. Pupils are equal, round, and reactive to light. Right eye exhibits no discharge. Left eye exhibits no discharge. No scleral icterus.  Neck: Normal range of motion. Neck supple. No JVD present. No tracheal deviation present. No thyromegaly present.  Cardiovascular: Normal rate, regular rhythm, normal heart sounds and intact distal pulses. Exam reveals no gallop and no friction rub.  No murmur heard. Pulmonary/Chest: Breath sounds normal. No respiratory distress. He has no wheezes. He has no rales.  Abdominal: Soft. Bowel sounds are normal. He exhibits no mass. There is no hepatosplenomegaly. There is no tenderness. There is no rebound, no guarding and no CVA tenderness.  Genitourinary: Rectum normal and prostate normal. Rectal exam shows guaiac negative stool.  Musculoskeletal: Normal range of motion. He exhibits no edema or tenderness.  Lymphadenopathy:    He has no cervical adenopathy.  Neurological: He is alert and oriented to person, place, and time. He has normal sensation, normal strength, normal reflexes and intact cranial nerves. No cranial nerve deficit.  Skin: Skin is warm. No rash noted.  Psychiatric: Mood and affect normal.  Nursing note and vitals reviewed.     Assessment & Plan  Problem List Items Addressed This Visit      Endocrine   Type 2 diabetes mellitus ([place]) - Primary   Relevant Medications   atorvastatin (LIPITOR) 40 MG tablet   glipiZIDE (GLUCOTROL) 5 MG tablet   losartan (COZAAR) 50 MG tablet   metFORMIN  (GLUCOPHAGE) 500 MG tablet   Other Relevant Orders   Hemoglobin A1c   Renal Function Panel   Lipid panel    Other Visit Diagnoses    Hyperlipidemia associated with type 2 diabetes mellitus ([facility])       Relevant Medications   atorvastatin (LIPITOR) 40 MG tablet   glipiZIDE (GLUCOTROL) 5 MG tablet   losartan (COZAAR) 50 MG tablet   metFORMIN (GLUCOPHAGE) 500 MG tablet   sildenafil (VIAGRA) 50 MG tablet   Other Relevant Orders   Lipid Panel With LDL/HDL Ratio   Lipid panel   Essential hypertension       Relevant Medications   atorvastatin (LIPITOR) 40 MG tablet   losartan (COZAAR) 50 MG tablet   sildenafil (VIAGRA) 50 MG tablet   Other Relevant Orders   Renal Function Panel   Erectile dysfunction, unspecified erectile dysfunction type       Relevant Medications   sildenafil (VIAGRA) 50 MG tablet   Taking medication for chronic disease       Relevant Orders   Hepatic function panel  Viral upper respiratory tract infection       mucinex   Class 1 obesity due to excess calories with serious comorbidity and body mass index (BMI) of 30.0 to 30.9 in adult       Relevant Medications   glipiZIDE (GLUCOTROL) 5 MG tablet   metFORMIN (GLUCOPHAGE) 500 MG tablet   Need for hepatitis C screening test       Relevant Orders   Hemoglobin A1c   Need for 23-polyvalent pneumococcal polysaccharide vaccine       Relevant Orders   Hepatitis C antibody   Pneumococcal polysaccharide vaccine 23-valent greater than or equal to 2yo subcutaneous/IM (Completed)      Meds ordered this encounter  Medications  . atorvastatin (LIPITOR) 40 MG tablet    Sig: Take 1 tablet (40 mg total) by mouth daily.    Dispense:  90 tablet    Refill:  1  . glipiZIDE (GLUCOTROL) 5 MG tablet    Sig: Take 1 tablet (5 mg total) by mouth 2 (two) times daily before a meal.    Dispense:  180 tablet    Refill:  1  . losartan (COZAAR) 50 MG tablet    Sig: Take 1 tablet (50 mg total) by mouth daily.    Dispense:  90  tablet    Refill:  1  . metFORMIN (GLUCOPHAGE) 500 MG tablet    Sig: Take 1 tablet (500 mg total) by mouth 2 (two) times daily with a meal.    Dispense:  180 tablet    Refill:  1  . sildenafil (VIAGRA) 50 MG tablet    Sig: Take 1 tablet (50 mg total) by mouth as needed.    Dispense:  7 tablet    Refill:  11  Health risks of being over weight were discussed and patient was counseled on weight loss options and exercise.    Dr. Mac[facility]  05/08/17

## 2017-05-09 LAB — HEPATIC FUNCTION PANEL
ALBUMIN: 4.6 g/dL (ref 3.5–4.8)
ALT: 17 IU/L (ref 0–44)
AST: 21 IU/L (ref 0–40)
Alkaline Phosphatase: 87 IU/L (ref 39–117)
Bilirubin Total: 1 mg/dL (ref 0.0–1.2)
Bilirubin, Direct: 0.28 mg/dL (ref 0.00–0.40)
TOTAL PROTEIN: 7.2 g/dL (ref 6.0–8.5)

## 2017-05-09 LAB — LIPID PANEL WITH LDL/HDL RATIO
Cholesterol, Total: 118 mg/dL (ref 100–199)
HDL: 30 mg/dL — ABNORMAL LOW (ref >39)
LDL Calculated: 65 mg/dL (ref 0–99)
LDL/HDL RATIO: 2.2 ratio (ref 0.0–3.6)
Triglycerides: 117 mg/dL (ref 0–149)
VLDL Cholesterol Cal: 23 mg/dL (ref 5–40)

## 2017-05-09 LAB — HEPATITIS C ANTIBODY: Hep C Virus Ab: 0.3 s/co ratio (ref 0.0–0.9)

## 2017-05-10 LAB — RENAL FUNCTION PANEL
Albumin: 4.8 g/dL (ref 3.5–4.8)
BUN / CREAT RATIO: 11 (ref 10–24)
BUN: 20 mg/dL (ref 8–27)
CHLORIDE: 100 mmol/L (ref 96–106)
CO2: 19 mmol/L — AB (ref 20–29)
Calcium: 9.9 mg/dL (ref 8.6–10.2)
Creatinine, Ser: 1.75 mg/dL — ABNORMAL HIGH (ref 0.76–1.27)
GFR calc Af Amer: 44 mL/min/{1.73_m2} — ABNORMAL LOW (ref >59)
GFR, EST NON AFRICAN AMERICAN: 38 mL/min/{1.73_m2} — AB (ref >59)
GLUCOSE: 100 mg/dL — AB (ref 65–99)
POTASSIUM: 4.6 mmol/L (ref 3.5–5.2)
Phosphorus: 3.2 mg/dL (ref 2.5–4.5)
SODIUM: 141 mmol/L (ref 134–144)

## 2017-05-10 LAB — SPECIMEN STATUS REPORT

## 2017-05-12 ENCOUNTER — Other Ambulatory Visit: Payer: Self-pay

## 2017-05-12 ENCOUNTER — Other Ambulatory Visit: Payer: Medicare Other

## 2017-05-12 DIAGNOSIS — E119 Type 2 diabetes mellitus without complications: Secondary | ICD-10-CM

## 2017-05-13 ENCOUNTER — Other Ambulatory Visit: Payer: Self-pay

## 2017-05-13 DIAGNOSIS — E1159 Type 2 diabetes mellitus with other circulatory complications: Secondary | ICD-10-CM

## 2017-05-13 LAB — HEMOGLOBIN A1C
Est. average glucose Bld gHb Est-mCnc: 163 mg/dL
Hgb A1c MFr Bld: 7.3 % — ABNORMAL HIGH (ref 4.8–5.6)

## 2017-05-13 MED ORDER — GLIPIZIDE 10 MG PO TABS: 10.0000 mg | ORAL_TABLET | Freq: Two times a day (BID) | ORAL | 1 refills | Status: DC

## 2017-06-05 DIAGNOSIS — E1122 Type 2 diabetes mellitus with diabetic chronic kidney disease: Secondary | ICD-10-CM | POA: Diagnosis not present

## 2017-06-05 DIAGNOSIS — I10 Essential (primary) hypertension: Secondary | ICD-10-CM | POA: Diagnosis not present

## 2017-06-05 DIAGNOSIS — N183 Chronic kidney disease, stage 3 (moderate): Secondary | ICD-10-CM | POA: Diagnosis not present

## 2017-06-05 DIAGNOSIS — D631 Anemia in chronic kidney disease: Secondary | ICD-10-CM | POA: Diagnosis not present

## 2017-07-04 ENCOUNTER — Other Ambulatory Visit: Payer: Medicare Other

## 2017-07-04 DIAGNOSIS — E119 Type 2 diabetes mellitus without complications: Secondary | ICD-10-CM

## 2017-07-05 LAB — HEMOGLOBIN A1C
ESTIMATED AVERAGE GLUCOSE: 143 mg/dL
Hgb A1c MFr Bld: 6.6 % — ABNORMAL HIGH (ref 4.8–5.6)

## 2017-07-07 DIAGNOSIS — Z9841 Cataract extraction status, right eye: Secondary | ICD-10-CM

## 2017-07-07 HISTORY — DX: Cataract extraction status, right eye: Z98.41

## 2017-07-11 ENCOUNTER — Other Ambulatory Visit: Payer: Self-pay | Admitting: Family Medicine

## 2017-07-11 DIAGNOSIS — E1159 Type 2 diabetes mellitus with other circulatory complications: Secondary | ICD-10-CM

## 2017-07-14 DIAGNOSIS — Z951 Presence of aortocoronary bypass graft: Secondary | ICD-10-CM | POA: Diagnosis not present

## 2017-07-14 DIAGNOSIS — E119 Type 2 diabetes mellitus without complications: Secondary | ICD-10-CM | POA: Diagnosis not present

## 2017-07-14 DIAGNOSIS — H2511 Age-related nuclear cataract, right eye: Secondary | ICD-10-CM | POA: Diagnosis not present

## 2017-07-14 DIAGNOSIS — I10 Essential (primary) hypertension: Secondary | ICD-10-CM | POA: Diagnosis not present

## 2017-07-14 DIAGNOSIS — Z7984 Long term (current) use of oral hypoglycemic drugs: Secondary | ICD-10-CM | POA: Diagnosis not present

## 2017-07-14 DIAGNOSIS — E669 Obesity, unspecified: Secondary | ICD-10-CM | POA: Diagnosis not present

## 2017-07-14 DIAGNOSIS — I2510 Atherosclerotic heart disease of native coronary artery without angina pectoris: Secondary | ICD-10-CM | POA: Diagnosis not present

## 2017-07-14 DIAGNOSIS — H52221 Regular astigmatism, right eye: Secondary | ICD-10-CM | POA: Diagnosis not present

## 2017-07-14 HISTORY — PX: CATARACT EXTRACTION W/ INTRAOCULAR LENS IMPLANT: SHX1309

## 2017-07-28 DIAGNOSIS — H2512 Age-related nuclear cataract, left eye: Secondary | ICD-10-CM | POA: Diagnosis not present

## 2017-07-28 DIAGNOSIS — Z7984 Long term (current) use of oral hypoglycemic drugs: Secondary | ICD-10-CM | POA: Diagnosis not present

## 2017-07-28 DIAGNOSIS — E1122 Type 2 diabetes mellitus with diabetic chronic kidney disease: Secondary | ICD-10-CM | POA: Diagnosis not present

## 2017-07-28 DIAGNOSIS — I129 Hypertensive chronic kidney disease with stage 1 through stage 4 chronic kidney disease, or unspecified chronic kidney disease: Secondary | ICD-10-CM | POA: Diagnosis not present

## 2017-07-28 DIAGNOSIS — I252 Old myocardial infarction: Secondary | ICD-10-CM | POA: Diagnosis not present

## 2017-07-28 DIAGNOSIS — Z9841 Cataract extraction status, right eye: Secondary | ICD-10-CM | POA: Diagnosis not present

## 2017-07-28 DIAGNOSIS — Z961 Presence of intraocular lens: Secondary | ICD-10-CM | POA: Diagnosis not present

## 2017-07-28 DIAGNOSIS — N183 Chronic kidney disease, stage 3 (moderate): Secondary | ICD-10-CM | POA: Diagnosis not present

## 2017-07-28 DIAGNOSIS — I2510 Atherosclerotic heart disease of native coronary artery without angina pectoris: Secondary | ICD-10-CM | POA: Diagnosis not present

## 2017-07-28 DIAGNOSIS — D509 Iron deficiency anemia, unspecified: Secondary | ICD-10-CM | POA: Diagnosis not present

## 2017-07-28 DIAGNOSIS — H52222 Regular astigmatism, left eye: Secondary | ICD-10-CM | POA: Diagnosis not present

## 2017-07-28 DIAGNOSIS — Z951 Presence of aortocoronary bypass graft: Secondary | ICD-10-CM | POA: Diagnosis not present

## 2017-07-28 HISTORY — PX: CATARACT EXTRACTION W/ INTRAOCULAR LENS IMPLANT: SHX1309

## 2017-08-04 ENCOUNTER — Other Ambulatory Visit: Payer: Self-pay | Admitting: Family Medicine

## 2017-08-04 DIAGNOSIS — E1159 Type 2 diabetes mellitus with other circulatory complications: Secondary | ICD-10-CM

## 2017-08-29 ENCOUNTER — Other Ambulatory Visit: Payer: Self-pay | Admitting: Family Medicine

## 2017-08-29 DIAGNOSIS — I10 Essential (primary) hypertension: Secondary | ICD-10-CM | POA: Diagnosis not present

## 2017-08-29 DIAGNOSIS — I2510 Atherosclerotic heart disease of native coronary artery without angina pectoris: Secondary | ICD-10-CM | POA: Diagnosis not present

## 2017-08-29 DIAGNOSIS — E1159 Type 2 diabetes mellitus with other circulatory complications: Secondary | ICD-10-CM

## 2017-08-29 DIAGNOSIS — I25810 Atherosclerosis of coronary artery bypass graft(s) without angina pectoris: Secondary | ICD-10-CM | POA: Diagnosis not present

## 2017-09-29 DIAGNOSIS — N183 Chronic kidney disease, stage 3 (moderate): Secondary | ICD-10-CM | POA: Diagnosis not present

## 2017-09-29 DIAGNOSIS — E1122 Type 2 diabetes mellitus with diabetic chronic kidney disease: Secondary | ICD-10-CM | POA: Diagnosis not present

## 2017-09-29 DIAGNOSIS — D631 Anemia in chronic kidney disease: Secondary | ICD-10-CM | POA: Diagnosis not present

## 2017-09-29 DIAGNOSIS — I10 Essential (primary) hypertension: Secondary | ICD-10-CM | POA: Diagnosis not present

## 2017-10-08 ENCOUNTER — Other Ambulatory Visit: Payer: Self-pay | Admitting: Family Medicine

## 2017-10-08 DIAGNOSIS — E1159 Type 2 diabetes mellitus with other circulatory complications: Secondary | ICD-10-CM

## 2017-11-01 ENCOUNTER — Other Ambulatory Visit: Payer: Self-pay | Admitting: Family Medicine

## 2017-11-01 DIAGNOSIS — E1159 Type 2 diabetes mellitus with other circulatory complications: Secondary | ICD-10-CM

## 2017-11-05 ENCOUNTER — Encounter: Payer: Self-pay | Admitting: Family Medicine

## 2017-11-05 ENCOUNTER — Ambulatory Visit (INDEPENDENT_AMBULATORY_CARE_PROVIDER_SITE_OTHER): Payer: Medicare Other | Admitting: Family Medicine

## 2017-11-05 VITALS — BP 124/70 | HR 60 | Ht 70.0 in | Wt 210.0 lb

## 2017-11-05 DIAGNOSIS — I25810 Atherosclerosis of coronary artery bypass graft(s) without angina pectoris: Secondary | ICD-10-CM | POA: Diagnosis not present

## 2017-11-05 DIAGNOSIS — E785 Hyperlipidemia, unspecified: Secondary | ICD-10-CM

## 2017-11-05 DIAGNOSIS — E1159 Type 2 diabetes mellitus with other circulatory complications: Secondary | ICD-10-CM | POA: Diagnosis not present

## 2017-11-05 DIAGNOSIS — E1169 Type 2 diabetes mellitus with other specified complication: Secondary | ICD-10-CM

## 2017-11-05 DIAGNOSIS — I10 Essential (primary) hypertension: Secondary | ICD-10-CM | POA: Diagnosis not present

## 2017-11-05 DIAGNOSIS — N529 Male erectile dysfunction, unspecified: Secondary | ICD-10-CM | POA: Diagnosis not present

## 2017-11-05 MED ORDER — SILDENAFIL CITRATE 50 MG PO TABS: 50.0000 mg | ORAL_TABLET | ORAL | 11 refills | Status: DC | PRN

## 2017-11-05 MED ORDER — LOSARTAN POTASSIUM 50 MG PO TABS: 50.0000 mg | ORAL_TABLET | Freq: Every day | ORAL | 1 refills | Status: DC

## 2017-11-05 MED ORDER — ASPIRIN 81 MG PO TABS
81.0000 mg | ORAL_TABLET | Freq: Every day | ORAL | 3 refills | Status: AC
Start: 2017-11-05 — End: ?

## 2017-11-05 MED ORDER — ATORVASTATIN CALCIUM 40 MG PO TABS: 40.0000 mg | ORAL_TABLET | Freq: Every day | ORAL | 1 refills | Status: DC

## 2017-11-05 MED ORDER — METFORMIN HCL 500 MG PO TABS: 500.0000 mg | ORAL_TABLET | Freq: Two times a day (BID) | ORAL | 0 refills | Status: DC

## 2017-11-05 MED ORDER — GLIPIZIDE 10 MG PO TABS: 10.0000 mg | ORAL_TABLET | Freq: Two times a day (BID) | ORAL | 1 refills | Status: DC

## 2017-11-05 NOTE — Assessment & Plan Note (Signed)
Stable on lipitor 40mg  qday/ recheck lipids

## 2017-11-05 NOTE — Progress Notes (Signed)
Name: [name]   MRN: [id number]    DOB: [birth year]   Date:11/05/2017       Progress Note  Subjective  Chief Complaint  Chief Complaint  Patient presents with  . Hypertension  . Hyperlipidemia  . Diabetes  . Erectile Dysfunction    Hypertension  This is a chronic problem. The current episode started more than 1 year ago. The problem has been waxing and waning since onset. The problem is controlled. Pertinent negatives include no anxiety, blurred vision, chest pain, headaches, malaise/fatigue, neck pain, orthopnea, palpitations, peripheral edema, PND, shortness of breath or sweats. There are no associated agents to hypertension. There are no known risk factors for coronary artery disease. Past treatments include angiotensin blockers. The current treatment provides no improvement. There are no compliance problems.  There is no history of angina, kidney disease, CAD/MI, CVA, heart failure, left ventricular hypertrophy, PVD or retinopathy. There is no history of chronic renal disease, a hypertension causing med or renovascular disease.  Hyperlipidemia  This is a chronic problem. The current episode started more than 1 year ago. Recent lipid tests were reviewed and are normal. He has no history of chronic renal disease, diabetes, hypothyroidism, liver disease, obesity or nephrotic syndrome. There are no known factors aggravating his hyperlipidemia. Pertinent negatives include no chest pain, focal sensory loss, focal weakness, leg pain, myalgias or shortness of breath. Current antihyperlipidemic treatment includes statins. The current treatment provides moderate improvement of lipids. There are no compliance problems.  There are no known risk factors for coronary artery disease.  Diabetes  He presents for his follow-up diabetic visit. He has gestational diabetes mellitus. His disease course has been stable. Pertinent negatives for hypoglycemia include no dizziness, headaches, hunger,  nervousness/anxiousness, pallor, seizures, sleepiness, speech difficulty or sweats. Pertinent negatives for diabetes include no blurred vision, no chest pain, no fatigue, no foot paresthesias, no foot ulcerations, no polydipsia, no polyphagia, no polyuria, no visual change, no weakness and no weight loss. There are no hypoglycemic complications. Symptoms are stable. There are no diabetic complications. Pertinent negatives for diabetic complications include no CVA, PVD or retinopathy. There are no known risk factors for coronary artery disease. Current diabetic treatment includes oral agent (dual therapy). He is compliant with treatment some of the time. His weight is stable. He is following a generally healthy diet. When asked about meal planning, he reported none. He participates in exercise intermittently. His home blood glucose trend is fluctuating minimally. His breakfast blood glucose is taken between 8-9 am. An ACE inhibitor/angiotensin II receptor blocker is being taken. He does not see a podiatrist.Eye exam is not current.  Erectile Dysfunction  This is a chronic problem. The problem has been waxing and waning since onset. The nature of his difficulty is achieving erection. He reports no anxiety. Irritative symptoms do not include frequency or urgency. Pertinent negatives include no chills, dysuria or hematuria.    Essential (primary) hypertension Chronic, but stable on losartan 50mg - continue meds/ recheck renal panel  Coronary artery disease Stable on lipitor 40mg  qday/ recheck lipids   Past Medical History:  Diagnosis Date  . Coronary artery disease   . Hyperlipidemia   . Hypertension   . Type II diabetes mellitus ([place])     Past Surgical History:  Procedure Laterality Date  . CARDIAC CATHETERIZATION N/A 04/18/2015   Procedure: Left Heart Cath and Coronary Angiography;  Surgeon: Yolonda Kida, MD;  Location: [facility];  Service: Cardiovascular;  Laterality: N/A;  .  COLONOSCOPY  2014   cleared for 10 yrs  . CORONARY ARTERY BYPASS GRAFT N/A 04/21/2015   Procedure: CORONARY ARTERY BYPASS GRAFTING (CABG) X 4 UTILIZING THE LEFT INTERNAL MAMMARY ARTERY TO LAD, ENDOSCOPICALLY HARVESTED RIGHT SAPHENEOUS VEINS TO PD,DIAGONAL AND CIRCUMFLEX.;  Surgeon: Grace Isaac, MD;  Location: [facility];  Service: Open Heart Surgery;  Laterality: N/A;  . DUPUYTREN CONTRACTURE RELEASE Left ~ 2000  . EYE SURGERY Bilateral    cataract removed  . KNEE ARTHROSCOPY Right ~ 1995  . TEE WITHOUT CARDIOVERSION N/A 04/21/2015   Procedure: TRANSESOPHAGEAL ECHOCARDIOGRAM (TEE);  Surgeon: Grace Isaac, MD;  Location: [facility];  Service: Open Heart Surgery;  Laterality: N/A;    Family History  Problem Relation Age of Onset  . Diabetes Mother   . Heart attack Unknown   . Hypertension Unknown     Social History   Socioeconomic History  . Marital status: Married    Spouse name: Not on file  . Number of children: Not on file  . Years of education: Not on file  . Highest education level: Not on file  Occupational History  . Not on file  Social Needs  . Financial resource strain: Not on file  . Food insecurity:    Worry: Not on file    Inability: Not on file  . Transportation needs:    Medical: Not on file    Non-medical: Not on file  Tobacco Use  . Smoking status: Never Smoker  . Smokeless tobacco: Never Used  Substance and Sexual Activity  . Alcohol use: Yes    Alcohol/week: 0.0 oz    Comment: 04/19/2015 "might have a drink a couple times/yr; at most"  . Drug use: No  . Sexual activity: Not Currently  Lifestyle  . Physical activity:    Days per week: Not on file    Minutes per session: Not on file  . Stress: Not on file  Relationships  . Social connections:    Talks on phone: Not on file    Gets together: Not on file    Attends religious service: Not on file    Active member of club or organization: Not on file    Attends meetings of clubs or organizations: Not  on file    Relationship status: Not on file  . Intimate partner violence:    Fear of current or ex partner: Not on file    Emotionally abused: Not on file    Physically abused: Not on file    Forced sexual activity: Not on file  Other Topics Concern  . Not on file  Social History Narrative  . Not on file    No Known Allergies  Outpatient Medications Prior to Visit  Medication Sig Dispense Refill  . glucose blood (FREESTYLE TEST STRIPS) test strip 1 each by Other route as needed for other. Use as instructed 50 each 11  . aspirin 81 MG tablet Take 81 mg by mouth daily.    Marland Kitchen atorvastatin (LIPITOR) 40 MG tablet Take 1 tablet (40 mg total) by mouth daily. 90 tablet 1  . glipiZIDE (GLUCOTROL) 10 MG tablet TAKE 1 TABLET (10 MG TOTAL) BY MOUTH 2 (TWO) TIMES DAILY BEFORE A MEAL. 60 tablet 0  . losartan (COZAAR) 50 MG tablet Take 1 tablet (50 mg total) by mouth daily. 90 tablet 1  . metFORMIN (GLUCOPHAGE) 500 MG tablet TAKE 1 TABLET (500 MG TOTAL) BY MOUTH 2 (TWO) TIMES DAILY WITH A MEAL. 180 tablet 0  .  sildenafil (VIAGRA) 50 MG tablet Take 1 tablet (50 mg total) by mouth as needed. 7 tablet 11   No facility-administered medications prior to visit.     Review of Systems  Constitutional: Negative for chills, fatigue, fever, malaise/fatigue and weight loss.  HENT: Negative for ear discharge, ear pain and sore throat.   Eyes: Negative for blurred vision.  Respiratory: Negative for cough, sputum production, shortness of breath and wheezing.   Cardiovascular: Negative for chest pain, palpitations, orthopnea, leg swelling and PND.  Gastrointestinal: Negative for abdominal pain, blood in stool, constipation, diarrhea, heartburn, melena and nausea.  Genitourinary: Negative for dysuria, frequency, hematuria and urgency.  Musculoskeletal: Negative for back pain, joint pain, myalgias and neck pain.  Skin: Negative for pallor and rash.  Neurological: Negative for dizziness, tingling, sensory change,  focal weakness, seizures, speech difficulty, weakness and headaches.  Endo/Heme/Allergies: Negative for environmental allergies, polydipsia and polyphagia. Does not bruise/bleed easily.  Psychiatric/Behavioral: Negative for depression and suicidal ideas. The patient is not nervous/anxious and does not have insomnia.      Objective  Vitals:   11/05/17 0900  BP: 124/70  Pulse: 60  Weight: 210 lb (95.3 kg)  Height: 5\' 10"  (1.778 m)    Physical Exam  Constitutional: He is oriented to person, place, and time.  HENT:  Head: Normocephalic.  Right Ear: Tympanic membrane, external ear and ear canal normal.  Left Ear: Tympanic membrane, external ear and ear canal normal.  Nose: Nose normal.  Mouth/Throat: Uvula is midline, oropharynx is clear and moist and mucous membranes are normal.  Eyes: Pupils are equal, round, and reactive to light. Conjunctivae and EOM are normal. Right eye exhibits no discharge. Left eye exhibits no discharge. No scleral icterus.  Neck: Normal range of motion. Neck supple. No JVD present. No tracheal deviation present. No thyromegaly present.  Cardiovascular: Normal rate, regular rhythm, normal heart sounds and intact distal pulses. Exam reveals no gallop and no friction rub.  No murmur heard. Pulmonary/Chest: Breath sounds normal. No respiratory distress. He has no wheezes. He has no rales.  Abdominal: Soft. Bowel sounds are normal. He exhibits no mass. There is no hepatosplenomegaly. There is no tenderness. There is no rebound, no guarding and no CVA tenderness.  Musculoskeletal: Normal range of motion. He exhibits no edema or tenderness.  Lymphadenopathy:    He has no cervical adenopathy.  Neurological: He is alert and oriented to person, place, and time. He has normal strength and normal reflexes. No cranial nerve deficit.  Skin: Skin is warm and dry. No rash noted.  Nursing note and vitals reviewed.     Assessment & Plan  Problem List Items Addressed This  Visit      Cardiovascular and Mediastinum   Essential (primary) hypertension    Chronic, but stable on losartan 50mg - continue meds/ recheck renal panel      Relevant Medications   sildenafil (VIAGRA) 50 MG tablet   losartan (COZAAR) 50 MG tablet   atorvastatin (LIPITOR) 40 MG tablet   aspirin 81 MG tablet   Coronary artery disease    Stable on lipitor 40mg  qday/ recheck lipids      Relevant Medications   sildenafil (VIAGRA) 50 MG tablet   losartan (COZAAR) 50 MG tablet   atorvastatin (LIPITOR) 40 MG tablet   aspirin 81 MG tablet     Endocrine   Type 2 diabetes mellitus (HCC)   Relevant Medications   losartan (COZAAR) 50 MG tablet   atorvastatin (LIPITOR) 40 MG tablet  metFORMIN (GLUCOPHAGE) 500 MG tablet   glipiZIDE (GLUCOTROL) 10 MG tablet   aspirin 81 MG tablet   Other Relevant Orders   Renal Function Panel   Hemoglobin A1c    Other Visit Diagnoses    Essential hypertension    -  Primary   chronic, but stable on losartan 50mg - continue meds   Relevant Medications   sildenafil (VIAGRA) 50 MG tablet   losartan (COZAAR) 50 MG tablet   atorvastatin (LIPITOR) 40 MG tablet   aspirin 81 MG tablet   Other Relevant Orders   Renal Function Panel   Erectile dysfunction, unspecified erectile dysfunction type       stable on viagra- continue as prescribed   Relevant Medications   sildenafil (VIAGRA) 50 MG tablet   Hyperlipidemia associated with type 2 diabetes mellitus (HCC)       chronic, but stable on Lipitor 40mg - continue meds as directed   Relevant Medications   sildenafil (VIAGRA) 50 MG tablet   losartan (COZAAR) 50 MG tablet   atorvastatin (LIPITOR) 40 MG tablet   metFORMIN (GLUCOPHAGE) 500 MG tablet   glipiZIDE (GLUCOTROL) 10 MG tablet   aspirin 81 MG tablet   Other Relevant Orders   Lipid panel      Meds ordered this encounter  Medications  . sildenafil (VIAGRA) 50 MG tablet    Sig: Take 1 tablet (50 mg total) by mouth as needed.    Dispense:  7  tablet    Refill:  11  . losartan (COZAAR) 50 MG tablet    Sig: Take 1 tablet (50 mg total) by mouth daily.    Dispense:  90 tablet    Refill:  1  . atorvastatin (LIPITOR) 40 MG tablet    Sig: Take 1 tablet (40 mg total) by mouth daily.    Dispense:  90 tablet    Refill:  1  . metFORMIN (GLUCOPHAGE) 500 MG tablet    Sig: Take 1 tablet (500 mg total) by mouth 2 (two) times daily with a meal.    Dispense:  180 tablet    Refill:  0  . glipiZIDE (GLUCOTROL) 10 MG tablet    Sig: Take 1 tablet (10 mg total) by mouth 2 (two) times daily before a meal.    Dispense:  180 tablet    Refill:  1  . aspirin 81 MG tablet    Sig: Take 1 tablet (81 mg total) by mouth daily.    Dispense:  100 tablet    Refill:  3      Dr. Otilio Miu [facility]  11/05/17

## 2017-11-05 NOTE — Assessment & Plan Note (Addendum)
Chronic, but stable on losartan 50mg - continue meds/ recheck renal panel

## 2017-11-06 LAB — RENAL FUNCTION PANEL
Albumin: 4.6 g/dL (ref 3.5–4.8)
BUN / CREAT RATIO: 14 (ref 10–24)
BUN: 24 mg/dL (ref 8–27)
CO2: 20 mmol/L (ref 20–29)
CREATININE: 1.71 mg/dL — AB (ref 0.76–1.27)
Calcium: 9.4 mg/dL (ref 8.6–10.2)
Chloride: 103 mmol/L (ref 96–106)
GFR, EST AFRICAN AMERICAN: 45 mL/min/{1.73_m2} — AB (ref >59)
GFR, EST NON AFRICAN AMERICAN: 39 mL/min/{1.73_m2} — AB (ref >59)
Glucose: 101 mg/dL — ABNORMAL HIGH (ref 65–99)
Phosphorus: 2.8 mg/dL (ref 2.5–4.5)
Potassium: 4.7 mmol/L (ref 3.5–5.2)
Sodium: 138 mmol/L (ref 134–144)

## 2017-11-06 LAB — LIPID PANEL
CHOL/HDL RATIO: 4.6 ratio (ref 0.0–5.0)
Cholesterol, Total: 129 mg/dL (ref 100–199)
HDL: 28 mg/dL — AB (ref >39)
LDL CALC: 71 mg/dL (ref 0–99)
Triglycerides: 149 mg/dL (ref 0–149)
VLDL Cholesterol Cal: 30 mg/dL (ref 5–40)

## 2017-11-06 LAB — HEMOGLOBIN A1C
ESTIMATED AVERAGE GLUCOSE: 166 mg/dL
HEMOGLOBIN A1C: 7.4 % — AB (ref 4.8–5.6)

## 2017-11-10 DIAGNOSIS — Z1283 Encounter for screening for malignant neoplasm of skin: Secondary | ICD-10-CM | POA: Diagnosis not present

## 2017-11-10 DIAGNOSIS — L570 Actinic keratosis: Secondary | ICD-10-CM | POA: Diagnosis not present

## 2017-11-10 DIAGNOSIS — Z872 Personal history of diseases of the skin and subcutaneous tissue: Secondary | ICD-10-CM | POA: Diagnosis not present

## 2017-11-10 DIAGNOSIS — L578 Other skin changes due to chronic exposure to nonionizing radiation: Secondary | ICD-10-CM | POA: Diagnosis not present

## 2017-11-10 DIAGNOSIS — L821 Other seborrheic keratosis: Secondary | ICD-10-CM | POA: Diagnosis not present

## 2018-01-10 DIAGNOSIS — Z23 Encounter for immunization: Secondary | ICD-10-CM | POA: Diagnosis not present

## 2018-01-30 ENCOUNTER — Other Ambulatory Visit: Payer: Self-pay | Admitting: Family Medicine

## 2018-01-30 DIAGNOSIS — E1159 Type 2 diabetes mellitus with other circulatory complications: Secondary | ICD-10-CM

## 2018-02-02 DIAGNOSIS — N183 Chronic kidney disease, stage 3 (moderate): Secondary | ICD-10-CM | POA: Diagnosis not present

## 2018-02-02 DIAGNOSIS — E1122 Type 2 diabetes mellitus with diabetic chronic kidney disease: Secondary | ICD-10-CM | POA: Diagnosis not present

## 2018-02-02 DIAGNOSIS — I10 Essential (primary) hypertension: Secondary | ICD-10-CM | POA: Diagnosis not present

## 2018-02-02 DIAGNOSIS — D631 Anemia in chronic kidney disease: Secondary | ICD-10-CM | POA: Diagnosis not present

## 2018-02-02 DIAGNOSIS — Z0000 Encounter for general adult medical examination without abnormal findings: Secondary | ICD-10-CM | POA: Diagnosis not present

## 2018-02-18 DIAGNOSIS — Z951 Presence of aortocoronary bypass graft: Secondary | ICD-10-CM | POA: Diagnosis not present

## 2018-02-18 DIAGNOSIS — I25810 Atherosclerosis of coronary artery bypass graft(s) without angina pectoris: Secondary | ICD-10-CM | POA: Diagnosis not present

## 2018-02-18 DIAGNOSIS — I10 Essential (primary) hypertension: Secondary | ICD-10-CM | POA: Diagnosis not present

## 2018-02-18 DIAGNOSIS — I2510 Atherosclerotic heart disease of native coronary artery without angina pectoris: Secondary | ICD-10-CM | POA: Diagnosis not present

## 2018-05-05 ENCOUNTER — Other Ambulatory Visit: Payer: Self-pay | Admitting: Family Medicine

## 2018-05-05 DIAGNOSIS — E1159 Type 2 diabetes mellitus with other circulatory complications: Secondary | ICD-10-CM

## 2018-05-06 ENCOUNTER — Other Ambulatory Visit: Payer: Self-pay | Admitting: Family Medicine

## 2018-05-06 DIAGNOSIS — I10 Essential (primary) hypertension: Secondary | ICD-10-CM

## 2018-05-12 DIAGNOSIS — Z961 Presence of intraocular lens: Secondary | ICD-10-CM | POA: Diagnosis not present

## 2018-05-12 DIAGNOSIS — E119 Type 2 diabetes mellitus without complications: Secondary | ICD-10-CM | POA: Diagnosis not present

## 2018-05-13 ENCOUNTER — Ambulatory Visit (INDEPENDENT_AMBULATORY_CARE_PROVIDER_SITE_OTHER): Payer: Medicare Other | Admitting: Family Medicine

## 2018-05-13 ENCOUNTER — Encounter: Payer: Self-pay | Admitting: Family Medicine

## 2018-05-13 VITALS — BP 112/78 | HR 65 | Resp 16 | Ht 70.0 in | Wt 206.8 lb

## 2018-05-13 DIAGNOSIS — M7541 Impingement syndrome of right shoulder: Secondary | ICD-10-CM

## 2018-05-13 DIAGNOSIS — E1159 Type 2 diabetes mellitus with other circulatory complications: Secondary | ICD-10-CM | POA: Diagnosis not present

## 2018-05-13 MED ORDER — METFORMIN HCL 500 MG PO TABS: 500.0000 mg | ORAL_TABLET | Freq: Two times a day (BID) | ORAL | 0 refills | Status: DC

## 2018-05-13 MED ORDER — MELOXICAM 7.5 MG PO TABS: 7.5000 mg | ORAL_TABLET | Freq: Every day | ORAL | 0 refills | Status: DC

## 2018-05-13 MED ORDER — GLIPIZIDE 10 MG PO TABS: 10.0000 mg | ORAL_TABLET | Freq: Two times a day (BID) | ORAL | 1 refills | Status: DC

## 2018-05-13 NOTE — Progress Notes (Signed)
Date:  05/13/2018   Name:  [name]   DOB:  1945/08/15   MRN:  [id number]   Chief Complaint: Diabetes (Range 85-120 )  Diabetes  He presents for his follow-up diabetic visit. He has type 2 diabetes mellitus. His disease course has been stable. There are no hypoglycemic associated symptoms. Pertinent negatives for hypoglycemia include no confusion, dizziness, headaches, mood changes, nervousness/anxiousness, pallor, sleepiness, speech difficulty or sweats. Pertinent negatives for diabetes include no blurred vision, no chest pain, no fatigue, no foot paresthesias, no foot ulcerations, no polydipsia, no polyphagia, no polyuria, no visual change, no weakness and no weight loss. There are no hypoglycemic complications. Symptoms are worsening. There are no diabetic complications. There are no known risk factors for coronary artery disease. He is compliant with treatment all of the time. His weight is stable. He is following a generally healthy diet. Meal planning includes avoidance of concentrated sweets and carbohydrate counting. He participates in exercise daily. There is no change in his home blood glucose trend. His breakfast blood glucose range is generally 110-130 mg/dl. An ACE inhibitor/angiotensin II receptor blocker is being taken. He does not see a podiatrist.Eye exam is current (yesterday).  Shoulder Pain   The pain is present in the right shoulder. This is a new problem. The current episode started more than 1 month ago. There has been no history of extremity trauma. The problem occurs constantly. The problem has been waxing and waning. The quality of the pain is described as aching. Pertinent negatives include no fever, inability to bear weight, itching, joint locking, joint swelling, limited range of motion, numbness, stiffness or tingling. The symptoms are aggravated by activity.    Review of Systems  Constitutional: Negative for chills, fatigue, fever and weight loss.  HENT:  Negative for drooling, ear discharge, ear pain and sore throat.   Eyes: Negative for blurred vision.  Respiratory: Negative for cough, shortness of breath and wheezing.   Cardiovascular: Negative for chest pain, palpitations and leg swelling.  Gastrointestinal: Negative for abdominal pain, blood in stool, constipation, diarrhea and nausea.  Endocrine: Negative for polydipsia, polyphagia and polyuria.  Genitourinary: Negative for dysuria, frequency, hematuria and urgency.  Musculoskeletal: Negative for back pain, myalgias, neck pain and stiffness.  Skin: Negative for itching, pallor and rash.  Allergic/Immunologic: Negative for environmental allergies.  Neurological: Negative for dizziness, tingling, speech difficulty, weakness, numbness and headaches.  Hematological: Does not bruise/bleed easily.  Psychiatric/Behavioral: Negative for confusion and suicidal ideas. The patient is not nervous/anxious.     Patient Active Problem List   Diagnosis Date Noted  . Beta thalassemia minor 03/20/2016  . Anemia 03/06/2016  . 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 Thrombocytopenia ([place]) 04/20/2015  . CAD (coronary artery disease) 04/19/2015  . Coronary artery disease 04/18/2015  . Unstable angina ([place]) 04/17/2015  . Elevated troponin 04/17/2015  . Type 2 diabetes mellitus ([place]) 04/17/2015  . Essential (primary) hypertension 04/17/2015    No Known Allergies  Past Surgical History:  Procedure Laterality Date  . CARDIAC CATHETERIZATION N/A 04/18/2015   Procedure: Left Heart Cath and Coronary Angiography;  Surgeon: Yolonda Kida, MD;  Location: [facility];  Service: Cardiovascular;  Laterality: N/A;  . COLONOSCOPY  2014   cleared for 10 yrs  . CORONARY ARTERY BYPASS GRAFT N/A 04/21/2015   Procedure: CORONARY ARTERY BYPASS GRAFTING (CABG) X 4 UTILIZING THE LEFT  INTERNAL MAMMARY ARTERY TO LAD,  ENDOSCOPICALLY HARVESTED RIGHT SAPHENEOUS VEINS TO PD,DIAGONAL AND CIRCUMFLEX.;  Surgeon: Grace Isaac, MD;  Location: [facility];  Service: Open Heart Surgery;  Laterality: N/A;  . DUPUYTREN CONTRACTURE RELEASE Left ~ 2000  . EYE SURGERY Bilateral    cataract removed  . KNEE ARTHROSCOPY Right ~ 1995  . TEE WITHOUT CARDIOVERSION N/A 04/21/2015   Procedure: TRANSESOPHAGEAL ECHOCARDIOGRAM (TEE);  Surgeon: Grace Isaac, MD;  Location: [facility];  Service: Open Heart Surgery;  Laterality: N/A;    Social History   Tobacco Use  . Smoking status: Never Smoker  . Smokeless tobacco: Never Used  Substance Use Topics  . Alcohol use: Yes    Alcohol/week: 0.0 standard drinks    Comment: 04/19/2015 "might have a drink a couple times/yr; at most"  . Drug use: No     Medication list has been reviewed and updated.  Current Meds  Medication Sig  . aspirin 81 MG tablet Take 1 tablet (81 mg total) by mouth daily.  Marland Kitchen atorvastatin (LIPITOR) 40 MG tablet Take 1 tablet (40 mg total) by mouth daily.  Marland Kitchen FREESTYLE LITE test strip USE TO TEST EVERY DAY  . glipiZIDE (GLUCOTROL) 10 MG tablet Take 1 tablet (10 mg total) by mouth 2 (two) times daily before a meal.  . losartan (COZAAR) 50 MG tablet TAKE 1 TABLET BY MOUTH EVERY DAY  . metFORMIN (GLUCOPHAGE) 500 MG tablet TAKE 1 TABLET (500 MG TOTAL) BY MOUTH 2 (TWO) TIMES DAILY WITH A MEAL.  . sildenafil (VIAGRA) 50 MG tablet Take 1 tablet (50 mg total) by mouth as needed.    PHQ 2/9 Scores 05/13/2018 11/05/2017 05/08/2017 08/24/2015  PHQ - 2 Score 0 0 0 0  PHQ- 9 Score 0 0 0 1    Physical Exam Vitals signs and nursing note reviewed.  HENT:     Head: Normocephalic.     Right Ear: External ear normal.     Left Ear: External ear normal.     Nose: Nose normal.  Eyes:     General: No scleral icterus.       Right eye: No discharge.        Left eye: No discharge.     Conjunctiva/sclera: Conjunctivae normal.     Pupils: Pupils are equal, round, and reactive to  light.  Neck:     Musculoskeletal: Normal range of motion and neck supple.     Thyroid: No thyromegaly.     Vascular: No JVD.     Trachea: No tracheal deviation.  Cardiovascular:     Rate and Rhythm: Normal rate and regular rhythm.     Heart sounds: Normal heart sounds. No murmur. No friction rub. No gallop.   Pulmonary:     Effort: No respiratory distress.     Breath sounds: Normal breath sounds. No wheezing or rales.  Abdominal:     General: Bowel sounds are normal.     Palpations: Abdomen is soft. There is no mass.     Tenderness: There is no abdominal tenderness. There is no guarding or rebound.  Musculoskeletal: Normal range of motion.        General: No tenderness.  Feet:     Right foot:     Protective Sensation: 10 sites tested. 10 sites sensed.     Skin integrity: Skin integrity normal. No ulcer, blister, skin breakdown, erythema, warmth, callus, dry skin or fissure.     Left foot:     Protective Sensation:  10 sites tested. 10 sites sensed.     Skin integrity: Skin integrity normal. No ulcer, blister, skin breakdown, erythema, warmth, callus, dry skin or fissure.  Lymphadenopathy:     Cervical: No cervical adenopathy.  Skin:    General: Skin is warm.     Findings: No rash.  Neurological:     Mental Status: He is alert and oriented to person, place, and time.     Cranial Nerves: No cranial nerve deficit.     Deep Tendon Reflexes: Reflexes are normal and symmetric.     BP 112/78   Pulse 65   Resp 16   Ht 5\' 10"  (1.778 m)   Wt 206 lb 12.8 oz (93.8 kg)   SpO2 96%   BMI 29.67 kg/m   Assessment and Plan:  1. Type 2 diabetes mellitus with other circulatory complication, without long-term current use of insulin ([place]) Returns today for recheck of diabetes with change in his regimen.  Patient has had blood sugars averaging in the 120 range will recheck an A1c renal function panel and for the time being continue Glucophage 500 mg twice daily and glipizide 10 mg twice  daily.  Will check A1c and renal function panel and adjust accordingly - Hemoglobin A1c - Renal Function Panel - metFORMIN (GLUCOPHAGE) 500 MG tablet; Take 1 tablet (500 mg total) by mouth 2 (two) times daily with a meal.  Dispense: 60 tablet; Refill: 0 - glipiZIDE (GLUCOTROL) 10 MG tablet; Take 1 tablet (10 mg total) by mouth 2 (two) times daily before a meal.  Dispense: 180 tablet; Refill: 1  2. Impingement syndrome of right shoulder She has had a multi week history of right shoulder pain with limited range of motion.  Exam is consistent with impingement.  Would like a trial of Aloxi cam 7.5 mg once a day and call if continued pain after 2 weeks with next step referring to orthopedics for possible injection. - meloxicam (MOBIC) 7.5 MG tablet; Take 1 tablet (7.5 mg total) by mouth daily.  Dispense: 30 tablet; Refill: 0

## 2018-05-14 LAB — HEMOGLOBIN A1C
Est. average glucose Bld gHb Est-mCnc: 148 mg/dL
Hgb A1c MFr Bld: 6.8 % — ABNORMAL HIGH (ref 4.8–5.6)

## 2018-05-14 LAB — RENAL FUNCTION PANEL
ALBUMIN: 4.7 g/dL (ref 3.7–4.7)
BUN/Creatinine Ratio: 14 (ref 10–24)
BUN: 24 mg/dL (ref 8–27)
CHLORIDE: 102 mmol/L (ref 96–106)
CO2: 22 mmol/L (ref 20–29)
Calcium: 9.5 mg/dL (ref 8.6–10.2)
Creatinine, Ser: 1.68 mg/dL — ABNORMAL HIGH (ref 0.76–1.27)
GFR calc non Af Amer: 40 mL/min/{1.73_m2} — ABNORMAL LOW (ref >59)
GFR, EST AFRICAN AMERICAN: 46 mL/min/{1.73_m2} — AB (ref >59)
GLUCOSE: 54 mg/dL — AB (ref 65–99)
PHOSPHORUS: 2.8 mg/dL (ref 2.8–4.1)
POTASSIUM: 4.4 mmol/L (ref 3.5–5.2)
Sodium: 139 mmol/L (ref 134–144)

## 2018-05-28 LAB — HM DIABETES EYE EXAM

## 2018-05-31 ENCOUNTER — Other Ambulatory Visit: Payer: Self-pay | Admitting: Family Medicine

## 2018-05-31 DIAGNOSIS — E1159 Type 2 diabetes mellitus with other circulatory complications: Secondary | ICD-10-CM

## 2018-05-31 DIAGNOSIS — I10 Essential (primary) hypertension: Secondary | ICD-10-CM

## 2018-06-02 ENCOUNTER — Other Ambulatory Visit: Payer: Self-pay

## 2018-06-11 DIAGNOSIS — E1122 Type 2 diabetes mellitus with diabetic chronic kidney disease: Secondary | ICD-10-CM | POA: Diagnosis not present

## 2018-06-11 DIAGNOSIS — D631 Anemia in chronic kidney disease: Secondary | ICD-10-CM | POA: Diagnosis not present

## 2018-06-11 DIAGNOSIS — R809 Proteinuria, unspecified: Secondary | ICD-10-CM | POA: Diagnosis not present

## 2018-06-11 DIAGNOSIS — N183 Chronic kidney disease, stage 3 (moderate): Secondary | ICD-10-CM | POA: Diagnosis not present

## 2018-06-11 DIAGNOSIS — I10 Essential (primary) hypertension: Secondary | ICD-10-CM | POA: Diagnosis not present

## 2018-06-27 ENCOUNTER — Other Ambulatory Visit: Payer: Self-pay | Admitting: Family Medicine

## 2018-06-27 DIAGNOSIS — I10 Essential (primary) hypertension: Secondary | ICD-10-CM

## 2018-07-07 ENCOUNTER — Other Ambulatory Visit: Payer: Self-pay | Admitting: Family Medicine

## 2018-07-07 DIAGNOSIS — E1169 Type 2 diabetes mellitus with other specified complication: Secondary | ICD-10-CM

## 2018-07-07 DIAGNOSIS — E785 Hyperlipidemia, unspecified: Principal | ICD-10-CM

## 2018-07-21 ENCOUNTER — Other Ambulatory Visit: Payer: Self-pay | Admitting: Family Medicine

## 2018-07-21 DIAGNOSIS — I10 Essential (primary) hypertension: Secondary | ICD-10-CM

## 2018-07-22 ENCOUNTER — Other Ambulatory Visit: Payer: Self-pay | Admitting: Family Medicine

## 2018-07-22 DIAGNOSIS — E1159 Type 2 diabetes mellitus with other circulatory complications: Secondary | ICD-10-CM

## 2018-08-12 DIAGNOSIS — I2510 Atherosclerotic heart disease of native coronary artery without angina pectoris: Secondary | ICD-10-CM | POA: Diagnosis not present

## 2018-08-12 DIAGNOSIS — I25810 Atherosclerosis of coronary artery bypass graft(s) without angina pectoris: Secondary | ICD-10-CM | POA: Diagnosis not present

## 2018-08-12 DIAGNOSIS — I10 Essential (primary) hypertension: Secondary | ICD-10-CM | POA: Diagnosis not present

## 2018-08-12 DIAGNOSIS — E1159 Type 2 diabetes mellitus with other circulatory complications: Secondary | ICD-10-CM | POA: Diagnosis not present

## 2018-09-01 ENCOUNTER — Other Ambulatory Visit: Payer: Self-pay | Admitting: Family Medicine

## 2018-09-01 DIAGNOSIS — E1159 Type 2 diabetes mellitus with other circulatory complications: Secondary | ICD-10-CM

## 2018-10-12 DIAGNOSIS — I10 Essential (primary) hypertension: Secondary | ICD-10-CM | POA: Diagnosis not present

## 2018-10-12 DIAGNOSIS — D631 Anemia in chronic kidney disease: Secondary | ICD-10-CM | POA: Diagnosis not present

## 2018-10-12 DIAGNOSIS — N183 Chronic kidney disease, stage 3 (moderate): Secondary | ICD-10-CM | POA: Diagnosis not present

## 2018-10-12 DIAGNOSIS — E1122 Type 2 diabetes mellitus with diabetic chronic kidney disease: Secondary | ICD-10-CM | POA: Diagnosis not present

## 2018-10-12 DIAGNOSIS — N390 Urinary tract infection, site not specified: Secondary | ICD-10-CM | POA: Diagnosis not present

## 2018-10-13 LAB — HEPATIC FUNCTION PANEL
ALT: 20 (ref 10–40)
AST: 30 (ref 14–40)
Alkaline Phosphatase: 65 (ref 25–125)
Bilirubin, Total: 1.4

## 2018-10-13 LAB — BASIC METABOLIC PANEL
BUN: 24 — AB (ref 4–21)
Creatinine: 1.6 — AB (ref 0.6–1.3)
Glucose: 96
Potassium: 5 (ref 3.4–5.3)
Sodium: 138 (ref 137–147)

## 2018-10-13 LAB — CBC AND DIFFERENTIAL
HCT: 40 — AB (ref 41–53)
Hemoglobin: 12.4 — AB (ref 13.5–17.5)
Platelets: 169 (ref 150–399)

## 2018-10-18 ENCOUNTER — Other Ambulatory Visit: Payer: Self-pay | Admitting: Family Medicine

## 2018-10-18 DIAGNOSIS — I10 Essential (primary) hypertension: Secondary | ICD-10-CM

## 2018-10-18 DIAGNOSIS — E1159 Type 2 diabetes mellitus with other circulatory complications: Secondary | ICD-10-CM

## 2018-11-10 ENCOUNTER — Ambulatory Visit (INDEPENDENT_AMBULATORY_CARE_PROVIDER_SITE_OTHER): Payer: Medicare Other | Admitting: Family Medicine

## 2018-11-10 ENCOUNTER — Encounter: Payer: Self-pay | Admitting: Family Medicine

## 2018-11-10 ENCOUNTER — Other Ambulatory Visit: Payer: Self-pay

## 2018-11-10 VITALS — BP 130/70 | HR 64 | Ht 70.0 in | Wt 205.0 lb

## 2018-11-10 DIAGNOSIS — I10 Essential (primary) hypertension: Secondary | ICD-10-CM

## 2018-11-10 DIAGNOSIS — N529 Male erectile dysfunction, unspecified: Secondary | ICD-10-CM | POA: Diagnosis not present

## 2018-11-10 DIAGNOSIS — E1159 Type 2 diabetes mellitus with other circulatory complications: Secondary | ICD-10-CM

## 2018-11-10 DIAGNOSIS — E785 Hyperlipidemia, unspecified: Secondary | ICD-10-CM

## 2018-11-10 DIAGNOSIS — R69 Illness, unspecified: Secondary | ICD-10-CM | POA: Diagnosis not present

## 2018-11-10 DIAGNOSIS — E1169 Type 2 diabetes mellitus with other specified complication: Secondary | ICD-10-CM

## 2018-11-10 MED ORDER — ATORVASTATIN CALCIUM 40 MG PO TABS: 40.0000 mg | ORAL_TABLET | Freq: Every day | ORAL | 1 refills | Status: DC

## 2018-11-10 MED ORDER — LOSARTAN POTASSIUM 50 MG PO TABS: 50.0000 mg | ORAL_TABLET | Freq: Every day | ORAL | 3 refills | Status: DC

## 2018-11-10 MED ORDER — GLIPIZIDE 10 MG PO TABS: 10.0000 mg | ORAL_TABLET | Freq: Two times a day (BID) | ORAL | 1 refills | Status: DC

## 2018-11-10 MED ORDER — SILDENAFIL CITRATE 50 MG PO TABS: 50.0000 mg | ORAL_TABLET | ORAL | 11 refills | Status: DC | PRN

## 2018-11-10 MED ORDER — METFORMIN HCL 500 MG PO TABS: 500.0000 mg | ORAL_TABLET | Freq: Two times a day (BID) | ORAL | 0 refills | Status: DC

## 2018-11-10 MED ORDER — FREESTYLE LITE TEST VI STRP: ORAL_STRIP | 2 refills | Status: DC

## 2018-11-10 NOTE — Patient Instructions (Signed)

## 2018-11-10 NOTE — Progress Notes (Signed)
Date:  11/10/2018   Name:  [name]   DOB:  30-Apr-1945   MRN:  [id number]   Chief Complaint: Hypertension, Hyperlipidemia, Diabetes, and Erectile Dysfunction  Hypertension This is a chronic problem. The current episode started more than 1 year ago. The problem is unchanged. The problem is controlled. Associated symptoms include shortness of breath. Pertinent negatives include no anxiety, blurred vision, chest pain, headaches, malaise/fatigue, neck pain, orthopnea, palpitations, peripheral edema, PND or sweats. There are no associated agents to hypertension. There are no known risk factors for coronary artery disease. Past treatments include angiotensin blockers. The current treatment provides moderate improvement. There are no compliance problems.  There is no history of angina, kidney disease, CAD/MI, CVA, heart failure, left ventricular hypertrophy, PVD or retinopathy. There is no history of chronic renal disease, a hypertension causing med or renovascular disease.  Hyperlipidemia This is a chronic problem. The current episode started more than 1 year ago. The problem is controlled. Recent lipid tests were reviewed and are normal. He has no history of chronic renal disease, diabetes, hypothyroidism, liver disease, obesity or nephrotic syndrome. There are no known factors aggravating his hyperlipidemia. Associated symptoms include shortness of breath. Pertinent negatives include no chest pain, focal sensory loss, focal weakness, leg pain or myalgias. Current antihyperlipidemic treatment includes statins. The current treatment provides moderate improvement of lipids. There are no compliance problems.  Risk factors for coronary artery disease include diabetes mellitus, dyslipidemia, hypertension, male sex and post-menopausal.  Diabetes He presents for his follow-up diabetic visit. He has type 2 diabetes mellitus. His disease course has been stable. There are no hypoglycemic associated symptoms.  Pertinent negatives for hypoglycemia include no headaches or sweats. Pertinent negatives for diabetes include no blurred vision, no chest pain, no fatigue, no foot paresthesias, no foot ulcerations, no polydipsia, no polyphagia, no polyuria, no visual change, no weakness and no weight loss. There are no hypoglycemic complications. Symptoms are stable. There are no diabetic complications. Pertinent negatives for diabetic complications include no CVA, PVD or retinopathy. Current diabetic treatment includes oral agent (dual therapy). He is compliant with treatment all of the time. His weight is stable. He is following a generally healthy diet. His breakfast blood glucose is taken between 8-9 am. His breakfast blood glucose range is generally 110-130 mg/dl. An ACE inhibitor/angiotensin II receptor blocker is being taken. He does not see a podiatrist.Eye exam is current.  Erectile Dysfunction This is a chronic problem. The problem is unchanged. Obstructive symptoms do not include dribbling, incomplete emptying, an intermittent stream, a slower stream, straining or a weak stream. Past treatments include sildenafil. The treatment provided moderate relief.    Review of Systems  Constitutional: Negative for fatigue, malaise/fatigue and weight loss.  Eyes: Negative for blurred vision.  Respiratory: Positive for shortness of breath.   Cardiovascular: Negative for chest pain, palpitations, orthopnea and PND.  Endocrine: Negative for polydipsia, polyphagia and polyuria.  Genitourinary: Negative for incomplete emptying.  Musculoskeletal: Negative for myalgias and neck pain.  Neurological: Negative for focal weakness, weakness and headaches.    Patient Active Problem List   Diagnosis Date Noted  . Beta thalassemia minor 03/20/2016  . Anemia 03/06/2016  . 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 Thrombocytopenia ([place])  04/20/2015  . CAD (coronary artery disease) 04/19/2015  . Coronary artery disease 04/18/2015  . Unstable angina ([place]) 04/17/2015  . Elevated troponin 04/17/2015  .  Type 2 diabetes mellitus ([place]) 04/17/2015  . Essential (primary) hypertension 04/17/2015    No Known Allergies  Past Surgical History:  Procedure Laterality Date  . CARDIAC CATHETERIZATION N/A 04/18/2015   Procedure: Left Heart Cath and Coronary Angiography;  Surgeon: Yolonda Kida, MD;  Location: [facility];  Service: Cardiovascular;  Laterality: N/A;  . COLONOSCOPY  2014   cleared for 10 yrs  . CORONARY ARTERY BYPASS GRAFT N/A 04/21/2015   Procedure: CORONARY ARTERY BYPASS GRAFTING (CABG) X 4 UTILIZING THE LEFT INTERNAL MAMMARY ARTERY TO LAD, ENDOSCOPICALLY HARVESTED RIGHT SAPHENEOUS VEINS TO PD,DIAGONAL AND CIRCUMFLEX.;  Surgeon: Grace Isaac, MD;  Location: [facility];  Service: Open Heart Surgery;  Laterality: N/A;  . DUPUYTREN CONTRACTURE RELEASE Left ~ 2000  . EYE SURGERY Bilateral    cataract removed  . KNEE ARTHROSCOPY Right ~ 1995  . TEE WITHOUT CARDIOVERSION N/A 04/21/2015   Procedure: TRANSESOPHAGEAL ECHOCARDIOGRAM (TEE);  Surgeon: Grace Isaac, MD;  Location: [facility];  Service: Open Heart Surgery;  Laterality: N/A;    Social History   Tobacco Use  . Smoking status: Never Smoker  . Smokeless tobacco: Never Used  Substance Use Topics  . Alcohol use: Yes    Alcohol/week: 0.0 standard drinks    Comment: 04/19/2015 "might have a drink a couple times/yr; at most"  . Drug use: No     Medication list has been reviewed and updated.  Current Meds  Medication Sig  . aspirin 81 MG tablet Take 1 tablet (81 mg total) by mouth daily.  Marland Kitchen atorvastatin (LIPITOR) 40 MG tablet TAKE 1 TABLET BY MOUTH EVERY DAY  . FREESTYLE LITE test strip USE TO TEST EVERY DAY  . glipiZIDE (GLUCOTROL) 10 MG tablet Take 1 tablet (10 mg total) by mouth 2 (two) times daily before a meal.  . losartan (COZAAR) 50 MG tablet  TAKE 1 TABLET BY MOUTH EVERY DAY  . metFORMIN (GLUCOPHAGE) 500 MG tablet TAKE 1 TABLET (500 MG TOTAL) BY MOUTH 2 (TWO) TIMES DAILY WITH A MEAL.  . sildenafil (VIAGRA) 50 MG tablet Take 1 tablet (50 mg total) by mouth as needed.  . [DISCONTINUED] meloxicam (MOBIC) 7.5 MG tablet Take 1 tablet (7.5 mg total) by mouth daily.    PHQ 2/9 Scores 11/10/2018 05/13/2018 11/05/2017 05/08/2017  PHQ - 2 Score 0 0 0 0  PHQ- 9 Score 0 0 0 0    BP Readings from Last 3 Encounters:  11/10/18 130/70  05/13/18 112/78  11/05/17 124/70    Physical Exam Vitals signs and nursing note reviewed.  HENT:     Head: Normocephalic.     Right Ear: Tympanic membrane, ear canal and external ear normal.     Left Ear: Tympanic membrane, ear canal and external ear normal.     Nose: Nose normal.     Mouth/Throat:     Mouth: Mucous membranes are moist.  Eyes:     General: No scleral icterus.       Right eye: No discharge.        Left eye: No discharge.     Conjunctiva/sclera: Conjunctivae normal.     Pupils: Pupils are equal, round, and reactive to light.  Neck:     Musculoskeletal: Normal range of motion and neck supple. No muscular tenderness.     Thyroid: No thyromegaly.     Vascular: No carotid bruit or JVD.     Trachea: No tracheal deviation.  Cardiovascular:     Rate and Rhythm:  Normal rate and regular rhythm.     Pulses: Normal pulses.     Heart sounds: Normal heart sounds, S1 normal and S2 normal. No murmur. No systolic murmur. No diastolic murmur. No friction rub. No gallop. No S3 or S4 sounds.   Pulmonary:     Effort: Pulmonary effort is normal. No respiratory distress.     Breath sounds: Normal breath sounds. No wheezing, rhonchi or rales.  Abdominal:     General: Abdomen is flat. Bowel sounds are normal. There is no distension.     Palpations: Abdomen is soft. There is no hepatomegaly, splenomegaly or mass.     Tenderness: There is no abdominal tenderness. There is no guarding or rebound.   Musculoskeletal: Normal range of motion.        General: No tenderness.     Right lower leg: No edema.     Left lower leg: No edema.  Lymphadenopathy:     Cervical: No cervical adenopathy.  Skin:    General: Skin is warm.     Capillary Refill: Capillary refill takes less than 2 seconds.     Findings: No rash.  Neurological:     Mental Status: He is alert and oriented to person, place, and time.     Cranial Nerves: No cranial nerve deficit.     Deep Tendon Reflexes: Reflexes are normal and symmetric.     Wt Readings from Last 3 Encounters:  11/10/18 205 lb (93 kg)  05/13/18 206 lb 12.8 oz (93.8 kg)  11/05/17 210 lb (95.3 kg)    BP 130/70   Pulse 64   Ht 5\' 10"  (1.778 m)   Wt 205 lb (93 kg)   BMI 29.41 kg/m   Assessment and Plan:  1. Erectile dysfunction, unspecified erectile dysfunction type Chronic.  Persistent.  Patient would like refills on his sildenafil 50 mg 1 tablet as needed with 11 refills for the year. - sildenafil (VIAGRA) 50 MG tablet; Take 1 tablet (50 mg total) by mouth as needed.  Dispense: 7 tablet; Refill: 11  2. Essential hypertension Chronic.  Controlled.  Continue Cozaar 50 mg once a day.  Will check renal panel. - losartan (COZAAR) 50 MG tablet; Take 1 tablet (50 mg total) by mouth daily.  Dispense: 90 tablet; Refill: 3  3. Hyperlipidemia associated with type 2 diabetes mellitus (HCC) Chronic.  Controlled.  Continue atorvastatin 40 mg once a day will check lipid panel. - Lipid Panel With LDL/HDL Ratio - atorvastatin (LIPITOR) 40 MG tablet; Take 1 tablet (40 mg total) by mouth daily.  Dispense: 90 tablet; Refill: 1  4. Type 2 diabetes mellitus with other circulatory complication, without long-term current use of insulin (HCC) Chronic.  Controlled.  Continue Glucotrol 10 mg 1 tablet twice a day metformin 500 mg 1 tablet twice a day will check A1c and microalbuminuria. - HgB A1c - Microalbumin, urine - glipiZIDE (GLUCOTROL) 10 MG tablet; Take 1  tablet (10 mg total) by mouth 2 (two) times daily before a meal.  Dispense: 180 tablet; Refill: 1 - metFORMIN (GLUCOPHAGE) 500 MG tablet; Take 1 tablet (500 mg total) by mouth 2 (two) times daily with a meal.  Dispense: 180 tablet; Refill: 0 - glucose blood (FREESTYLE LITE) test strip; USE TO TEST EVERY DAY  Dispense: 100 each; Refill: 2  5. Taking medication for chronic disease Patient on statin agent will check hepatic enzymes for evaluation of possible hepatotoxicity.

## 2018-11-11 LAB — HEMOGLOBIN A1C
Est. average glucose Bld gHb Est-mCnc: 151 mg/dL
Hgb A1c MFr Bld: 6.9 % — ABNORMAL HIGH (ref 4.8–5.6)

## 2018-11-11 LAB — LIPID PANEL WITH LDL/HDL RATIO
Cholesterol, Total: 165 mg/dL (ref 100–199)
HDL: 32 mg/dL — ABNORMAL LOW (ref >39)
LDL Calculated: 58 mg/dL (ref 0–99)
LDl/HDL Ratio: 1.8 ratio (ref 0.0–3.6)
Triglycerides: 374 mg/dL — ABNORMAL HIGH (ref 0–149)
VLDL Cholesterol Cal: 75 mg/dL — ABNORMAL HIGH (ref 5–40)

## 2018-11-11 LAB — HEPATIC FUNCTION PANEL
ALT: 20 IU/L (ref 0–44)
AST: 17 IU/L (ref 0–40)
Albumin: 4.7 g/dL (ref 3.7–4.7)
Alkaline Phosphatase: 83 IU/L (ref 39–117)
Bilirubin Total: 0.7 mg/dL (ref 0.0–1.2)
Bilirubin, Direct: 0.2 mg/dL (ref 0.00–0.40)
Total Protein: 7.2 g/dL (ref 6.0–8.5)

## 2018-11-11 LAB — MICROALBUMIN, URINE: Microalbumin, Urine: 26.6 ug/mL

## 2018-11-16 ENCOUNTER — Other Ambulatory Visit: Payer: Self-pay

## 2018-11-16 DIAGNOSIS — E1169 Type 2 diabetes mellitus with other specified complication: Secondary | ICD-10-CM

## 2018-11-16 MED ORDER — FENOFIBRATE 145 MG PO TABS: 145.0000 mg | ORAL_TABLET | Freq: Every day | ORAL | 1 refills | Status: DC

## 2018-11-16 NOTE — Progress Notes (Unsigned)
Sent in tricor

## 2018-11-17 DIAGNOSIS — L570 Actinic keratosis: Secondary | ICD-10-CM | POA: Diagnosis not present

## 2018-11-17 DIAGNOSIS — Z872 Personal history of diseases of the skin and subcutaneous tissue: Secondary | ICD-10-CM | POA: Diagnosis not present

## 2018-11-17 DIAGNOSIS — L578 Other skin changes due to chronic exposure to nonionizing radiation: Secondary | ICD-10-CM | POA: Diagnosis not present

## 2019-01-01 DIAGNOSIS — Z23 Encounter for immunization: Secondary | ICD-10-CM | POA: Diagnosis not present

## 2019-01-03 ENCOUNTER — Other Ambulatory Visit: Payer: Self-pay | Admitting: Family Medicine

## 2019-01-03 DIAGNOSIS — E1169 Type 2 diabetes mellitus with other specified complication: Secondary | ICD-10-CM

## 2019-01-11 ENCOUNTER — Other Ambulatory Visit: Payer: Self-pay

## 2019-01-11 ENCOUNTER — Other Ambulatory Visit: Payer: Medicare Other

## 2019-01-11 DIAGNOSIS — E1169 Type 2 diabetes mellitus with other specified complication: Secondary | ICD-10-CM | POA: Diagnosis not present

## 2019-01-11 DIAGNOSIS — R69 Illness, unspecified: Secondary | ICD-10-CM

## 2019-01-11 DIAGNOSIS — E785 Hyperlipidemia, unspecified: Secondary | ICD-10-CM | POA: Diagnosis not present

## 2019-01-12 LAB — HEPATIC FUNCTION PANEL
ALT: 19 IU/L (ref 0–44)
AST: 13 IU/L (ref 0–40)
Albumin: 4.5 g/dL (ref 3.7–4.7)
Alkaline Phosphatase: 71 IU/L (ref 39–117)
Bilirubin Total: 1.1 mg/dL (ref 0.0–1.2)
Bilirubin, Direct: 0.32 mg/dL (ref 0.00–0.40)
Total Protein: 6.9 g/dL (ref 6.0–8.5)

## 2019-01-12 LAB — LIPID PANEL WITH LDL/HDL RATIO
Cholesterol, Total: 126 mg/dL (ref 100–199)
HDL: 38 mg/dL — ABNORMAL LOW (ref >39)
LDL Chol Calc (NIH): 72 mg/dL (ref 0–99)
LDL/HDL Ratio: 1.9 ratio (ref 0.0–3.6)
Triglycerides: 82 mg/dL (ref 0–149)
VLDL Cholesterol Cal: 16 mg/dL (ref 5–40)

## 2019-01-30 DIAGNOSIS — R8281 Pyuria: Secondary | ICD-10-CM | POA: Insufficient documentation

## 2019-01-30 DIAGNOSIS — N183 Chronic kidney disease, stage 3 unspecified: Secondary | ICD-10-CM | POA: Insufficient documentation

## 2019-02-01 DIAGNOSIS — E1122 Type 2 diabetes mellitus with diabetic chronic kidney disease: Secondary | ICD-10-CM | POA: Diagnosis not present

## 2019-02-01 DIAGNOSIS — I10 Essential (primary) hypertension: Secondary | ICD-10-CM | POA: Diagnosis not present

## 2019-02-01 DIAGNOSIS — D631 Anemia in chronic kidney disease: Secondary | ICD-10-CM | POA: Diagnosis not present

## 2019-02-01 DIAGNOSIS — N1832 Chronic kidney disease, stage 3b: Secondary | ICD-10-CM | POA: Diagnosis not present

## 2019-02-25 DIAGNOSIS — I25810 Atherosclerosis of coronary artery bypass graft(s) without angina pectoris: Secondary | ICD-10-CM | POA: Diagnosis not present

## 2019-02-25 DIAGNOSIS — I10 Essential (primary) hypertension: Secondary | ICD-10-CM | POA: Diagnosis not present

## 2019-02-25 DIAGNOSIS — Z951 Presence of aortocoronary bypass graft: Secondary | ICD-10-CM | POA: Diagnosis not present

## 2019-02-25 DIAGNOSIS — I2510 Atherosclerotic heart disease of native coronary artery without angina pectoris: Secondary | ICD-10-CM | POA: Diagnosis not present

## 2019-05-03 ENCOUNTER — Other Ambulatory Visit: Payer: Self-pay

## 2019-05-03 ENCOUNTER — Ambulatory Visit (INDEPENDENT_AMBULATORY_CARE_PROVIDER_SITE_OTHER): Payer: Medicare Other

## 2019-05-03 VITALS — BP 132/78 | HR 64 | Temp 96.8°F | Resp 16 | Ht 70.0 in | Wt 207.2 lb

## 2019-05-03 DIAGNOSIS — Z0000 Encounter for general adult medical examination without abnormal findings: Secondary | ICD-10-CM

## 2019-05-03 NOTE — Progress Notes (Signed)
Subjective:   [name] is a 74 y.o. male who presents for Medicare Annual/Subsequent preventive examination.  Review of Systems:   Cardiac Risk Factors include: advanced age (>73men, >65 women);diabetes mellitus;dyslipidemia;male gender;hypertension     Objective:    Vitals: BP 132/78 (BP Location: Left Arm, Patient Position: Sitting, Cuff Size: Normal)   Pulse 64   Temp (!) 96.8 F (36 C) (Temporal)   Resp 16   Ht 5\' 10"  (1.778 m)   Wt 207 lb 3.2 oz (94 kg)   SpO2 96%   BMI 29.73 kg/m   Body mass index is 29.73 kg/m.  Advanced Directives 05/03/2019 03/20/2016 03/06/2016 06/29/2015 04/20/2015 04/19/2015 04/18/2015  Does Patient Have a Medical Advance Directive? Yes Yes Yes Yes Yes No No  Type of Paramedic of [place];Living will - - [place];Living will - - -  Copy of [place] in Chart? Yes - validated most recent copy scanned in chart (See row information) - - No - copy requested Yes - -  Would patient like information on creating a medical advance directive? - - - - - Yes - Scientist, clinical (histocompatibility and immunogenetics) given -    Tobacco Social History   Tobacco Use  Smoking Status Never Smoker  Smokeless Tobacco Never Used     Counseling given: Not Answered   Clinical Intake:  Pre-visit preparation completed: Yes  Pain : 0-10 Pain Score: 2  Pain Type: Acute pain Pain Location: Shoulder Pain Orientation: Left Pain Descriptors / Indicators: Aching, Discomfort Pain Onset: 1 to 4 weeks ago Pain Frequency: Occasional     BMI - recorded: 29.73 Nutritional Status: BMI 25 -29 Overweight Nutritional Risks: None Diabetes: Yes CBG done?: No Did pt. bring in CBG monitor from home?: No   Nutrition Risk Assessment:  Has the patient had any N/V/D within the last 2 months?  No  Does the patient have any non-healing wounds?  No  Has the patient had any unintentional weight loss or weight gain?  No   Diabetes:  Is  the patient diabetic?  Yes  If diabetic, was a CBG obtained today?  No  Did the patient bring in their glucometer from home?  No  How often do you monitor your CBG's? daily.   Financial Strains and Diabetes Management:  Are you having any financial strains with the device, your supplies or your medication? No .  Does the patient want to be seen by Chronic Care Management for management of their diabetes?  No  Would the patient like to be referred to a Nutritionist or for Diabetic Management?  No   Diabetic Exams:  Diabetic Eye Exam: Completed 05/28/18 negative retinopathy.   Diabetic Foot Exam: Completed 05/13/18.   How often do you need to have someone help you when you read instructions, pamphlets, or other written materials from your doctor or pharmacy?: 1 - Never  Interpreter Needed?: No  Information entered by :: Clemetine Marker LPN  Past Medical History:  Diagnosis Date  . Coronary artery disease   . Hyperlipidemia   . Hypertension   . Type II diabetes mellitus ([place])    Past Surgical History:  Procedure Laterality Date  . CARDIAC CATHETERIZATION N/A 04/18/2015   Procedure: Left Heart Cath and Coronary Angiography;  Surgeon: Yolonda Kida, MD;  Location: [facility];  Service: Cardiovascular;  Laterality: N/A;  . COLONOSCOPY  2014   cleared for 10 yrs  . CORONARY ARTERY BYPASS GRAFT N/A  04/21/2015   Procedure: CORONARY ARTERY BYPASS GRAFTING (CABG) X 4 UTILIZING THE LEFT INTERNAL MAMMARY ARTERY TO LAD, ENDOSCOPICALLY HARVESTED RIGHT SAPHENEOUS VEINS TO PD,DIAGONAL AND CIRCUMFLEX.;  Surgeon: Grace Isaac, MD;  Location: [facility];  Service: Open Heart Surgery;  Laterality: N/A;  . DUPUYTREN CONTRACTURE RELEASE Left ~ 2000  . EYE SURGERY Bilateral    cataract removed  . KNEE ARTHROSCOPY Right ~ 1995  . TEE WITHOUT CARDIOVERSION N/A 04/21/2015   Procedure: TRANSESOPHAGEAL ECHOCARDIOGRAM (TEE);  Surgeon: Grace Isaac, MD;  Location: [facility];  Service: Open Heart  Surgery;  Laterality: N/A;   Family History  Problem Relation Age of Onset  . Diabetes Mother   . Heart attack Other   . Hypertension Other    Social History   Socioeconomic History  . Marital status: Married    Spouse name: Not on file  . Number of children: Not on file  . Years of education: Not on file  . Highest education level: Not on file  Occupational History  . Not on file  Tobacco Use  . Smoking status: Never Smoker  . Smokeless tobacco: Never Used  Substance and Sexual Activity  . Alcohol use: Yes    Alcohol/week: 0.0 standard drinks    Comment: 04/19/2015 "might have a drink a couple times/yr; at most"  . Drug use: No  . Sexual activity: Not Currently  Other Topics Concern  . Not on file  Social History Narrative  . Not on file   Social Determinants of Health   Financial Resource Strain: Low Risk   . Difficulty of Paying Living Expenses: Not hard at all  Food Insecurity: No Food Insecurity  . Worried About Charity fundraiser in the Last Year: Never true  . Ran Out of Food in the Last Year: Never true  Transportation Needs: No Transportation Needs  . Lack of Transportation (Medical): No  . Lack of Transportation (Non-Medical): No  Physical Activity: Inactive  . Days of Exercise per Week: 0 days  . Minutes of Exercise per Session: 0 min  Stress: No Stress Concern Present  . Feeling of Stress : Not at all  Social Connections: Unknown  . Frequency of Communication with Friends and Family: Patient refused  . Frequency of Social Gatherings with Friends and Family: Patient refused  . Attends Religious Services: Patient refused  . Active Member of Clubs or Organizations: Patient refused  . Attends Archivist Meetings: Patient refused  . Marital Status: Patient refused    Outpatient Encounter Medications as of 05/03/2019  Medication Sig  . aspirin 81 MG tablet Take 1 tablet (81 mg total) by mouth daily.  Marland Kitchen atorvastatin (LIPITOR) 40 MG tablet Take  1 tablet (40 mg total) by mouth daily.  Marland Kitchen glipiZIDE (GLUCOTROL) 10 MG tablet Take 1 tablet (10 mg total) by mouth 2 (two) times daily before a meal.  . glucose blood (FREESTYLE LITE) test strip USE TO TEST EVERY DAY  . losartan (COZAAR) 50 MG tablet Take 1 tablet (50 mg total) by mouth daily.  . metFORMIN (GLUCOPHAGE) 500 MG tablet Take 1 tablet (500 mg total) by mouth 2 (two) times daily with a meal.  . sildenafil (VIAGRA) 50 MG tablet Take 1 tablet (50 mg total) by mouth as needed.  . fenofibrate (TRICOR) 145 MG tablet TAKE 1 TABLET BY MOUTH EVERY DAY (Patient not taking: Reported on 05/03/2019)   No facility-administered encounter medications on file as of 05/03/2019.    Activities of  Daily Living In your present state of health, do you have any difficulty performing the following activities: 05/03/2019 05/13/2018  Hearing? N N  Comment declines hearing aids -  Vision? N N  Difficulty concentrating or making decisions? N N  Walking or climbing stairs? N N  Dressing or bathing? N N  Doing errands, shopping? N N  Preparing Food and eating ? N -  Using the Toilet? N -  In the past six months, have you accidently leaked urine? N -  Do you have problems with loss of bowel control? N -  Managing your Medications? N -  Managing your Finances? N -  Housekeeping or managing your Housekeeping? N -  Some recent data might be hidden    Patient Care Team: Juline Patch, MD as PCP - General (Family Medicine) Grace Isaac, MD as Consulting Physician (Cardiothoracic Surgery) Yolonda Kida, MD as Consulting Physician (Cardiology) Teodoro Spray, MD as Consulting Physician (Cardiology) Anthonette Legato, MD (Internal Medicine)   Assessment:   This is a routine wellness examination for Gwyndolyn Saxon.  Exercise Activities and Dietary recommendations Current Exercise Habits: The patient does not participate in regular exercise at present, Exercise limited by: None identified  Goals    .  Increase physical activity     Recommend regular physical activity at least 3 days per week       Fall Risk Fall Risk  05/03/2019 05/13/2018 11/05/2017 05/08/2017 10/25/2016  Falls in the past year? 1 0 No No No  Comment - - - - [place] Telephone Survey: data to providers prior to load  Number falls in past yr: 1 0 - - -  Injury with Fall? 0 0 - - -  Risk for fall due to : No Fall Risks - - - -  Follow up Falls prevention discussed - - - -   FALL RISK PREVENTION PERTAINING TO THE HOME:  Any stairs in or around the home? Yes  If so, do they handrails? No  3 steps outside  Home free of loose throw rugs in walkways, pet beds, electrical cords, etc? Yes  Adequate lighting in your home to reduce risk of falls? Yes   ASSISTIVE DEVICES UTILIZED TO PREVENT FALLS:  Life alert? No  Use of a cane, walker or w/c? No  Grab bars in the bathroom? No  Shower chair or bench in shower? No  Elevated toilet seat or a handicapped toilet? No   DME ORDERS:  DME order needed?  No   TIMED UP AND GO:  Was the test performed? Yes .  Length of time to ambulate 10 feet: 5 sec.   GAIT:  Appearance of gait: Gait stead-fast and without the use of an assistive device.   Education: Fall risk prevention has been discussed.  Intervention(s) required? No   Depression Screen PHQ 2/9 Scores 05/03/2019 11/10/2018 05/13/2018 11/05/2017  PHQ - 2 Score 0 0 0 0  PHQ- 9 Score - 0 0 0    Cognitive Function     6CIT Screen 05/03/2019  What Year? 0 points  What month? 0 points  What time? 0 points  Count back from 20 0 points  Months in reverse 0 points  Repeat phrase 0 points  Total Score 0    Immunization History  Administered Date(s) Administered  . Influenza, High Dose Seasonal PF 03/03/2017, 01/10/2018  . Influenza-Unspecified 01/07/2015, 01/01/2019  . Pneumococcal Conjugate-13 12/01/2014  . Pneumococcal Polysaccharide-23 05/08/2017  . Tdap 04/09/2011  . Zoster  04/09/2011    Qualifies for Shingles  Vaccine? Yes  Zostavax completed 2013. Due for Shingrix. Education has been provided regarding the importance of this vaccine. Pt has been advised to call insurance company to determine out of pocket expense. Advised may also receive vaccine at local pharmacy or Health Dept. Verbalized acceptance and understanding.  Tdap: Up to date  Flu Vaccine: Up to date  Pneumococcal Vaccine: Up to date   Screening Tests Health Maintenance  Topic Date Due  . HEMOGLOBIN A1C  05/13/2019  . FOOT EXAM  05/14/2019  . OPHTHALMOLOGY EXAM  05/29/2019  . TETANUS/TDAP  04/08/2021  . COLONOSCOPY  04/28/2022  . INFLUENZA VACCINE  Completed  . Hepatitis C Screening  Completed  . PNA vac Low Risk Adult  Completed   Cancer Screenings:  Colorectal Screening: Completed 04/28/12. Repeat every 10 years;   Lung Cancer Screening: (Low Dose CT Chest recommended if Age 27-80 years, 30 pack-year currently smoking OR have quit w/in 15years.) does not qualify.    Additional Screening:  Hepatitis C Screening: does qualify; Completed 05/08/17  Vision Screening: Recommended annual ophthalmology exams for early detection of glaucoma and other disorders of the eye. Is the patient up to date with their annual eye exam?  Yes  Who is the provider or what is the name of the office in which the pt attends annual eye exams? Dr. Atilano Median  Dental Screening: Recommended annual dental exams for proper oral hygiene  Community Resource Referral:  CRR required this visit?  No       Plan:    I have personally reviewed and addressed the Medicare Annual Wellness questionnaire and have noted the following in the patient's chart:  A. Medical and social history B. Use of alcohol, tobacco or illicit drugs  C. Current medications and supplements D. Functional ability and status E.  Nutritional status F.  Physical activity G. Advance directives H. List of other physicians I.  Hospitalizations, surgeries, and ER visits in previous 12  months J.  [place] such as hearing and vision if needed, cognitive and depression L. Referrals and appointments   In addition, I have reviewed and discussed with patient certain preventive protocols, quality metrics, and best practice recommendations. A written personalized care plan for preventive services as well as general preventive health recommendations were provided to patient.   Signed,  Clemetine Marker, LPN Nurse Health Advisor   Nurse Notes: none

## 2019-05-03 NOTE — Patient Instructions (Signed)
[name] , Thank you for taking time to come for your Medicare Wellness Visit. I appreciate your ongoing commitment to your health goals. Please review the following plan we discussed and let me know if I can assist you in the future.   Screening recommendations/referrals: Colonoscopy: done 04/28/12. Repeat in 2024. Recommended yearly ophthalmology/optometry visit for glaucoma screening and checkup Recommended yearly dental visit for hygiene and checkup  Vaccinations: Influenza vaccine: done 01/01/19 Pneumococcal vaccine: done 05/08/17 Tdap vaccine: done 2013 Shingles vaccine: Shingrix discussed. Please contact your pharmacy for coverage information.   Conditions/risks identified: Recommend increasing physical activity to at least 3 days per week  Next appointment: Please follow up in one year for your Medicare Annual Wellness visit.    Preventive Care 73 Years and Older, Male Preventive care refers to lifestyle choices and visits with your health care provider that can promote health and wellness. What does preventive care include?  A yearly physical exam. This is also called an annual well check.  Dental exams once or twice a year.  Routine eye exams. Ask your health care provider how often you should have your eyes checked.  Personal lifestyle choices, including:  Daily care of your teeth and gums.  Regular physical activity.  Eating a healthy diet.  Avoiding tobacco and drug use.  Limiting alcohol use.  Practicing safe sex.  Taking low doses of aspirin every day.  Taking vitamin and mineral supplements as recommended by your health care provider. What happens during an annual well check? The services and screenings done by your health care provider during your annual well check will depend on your age, overall health, lifestyle risk factors, and family history of disease. Counseling  Your health care provider may ask you questions about your:  Alcohol  use.  Tobacco use.  Drug use.  Emotional well-being.  Home and relationship well-being.  Sexual activity.  Eating habits.  History of falls.  Memory and ability to understand (cognition).  Work and work Statistician. Screening  You may have the following tests or measurements:  Height, weight, and BMI.  Blood pressure.  Lipid and cholesterol levels. These may be checked every 5 years, or more frequently if you are over 79 years old.  Skin check.  Lung cancer screening. You may have this screening every year starting at age 32 if you have a 30-pack-year history of smoking and currently smoke or have quit within the past 15 years.  Fecal occult blood test (FOBT) of the stool. You may have this test every year starting at age 33.  Flexible sigmoidoscopy or colonoscopy. You may have a sigmoidoscopy every 5 years or a colonoscopy every 10 years starting at age 48.  Prostate cancer screening. Recommendations will vary depending on your family history and other risks.  Hepatitis C blood test.  Hepatitis B blood test.  Sexually transmitted disease (STD) testing.  Diabetes screening. This is done by checking your blood sugar (glucose) after you have not eaten for a while (fasting). You may have this done every 1-3 years.  Abdominal aortic aneurysm (AAA) screening. You may need this if you are a current or former smoker.  Osteoporosis. You may be screened starting at age 17 if you are at high risk. Talk with your health care provider about your test results, treatment options, and if necessary, the need for more tests. Vaccines  Your health care provider may recommend certain vaccines, such as:  Influenza vaccine. This is recommended every year.  Tetanus, diphtheria,  and acellular pertussis (Tdap, Td) vaccine. You may need a Td booster every 10 years.  Zoster vaccine. You may need this after age 34.  Pneumococcal 13-valent conjugate (PCV13) vaccine. One dose is  recommended after age 61.  Pneumococcal polysaccharide (PPSV23) vaccine. One dose is recommended after age 37. Talk to your health care provider about which screenings and vaccines you need and how often you need them. This information is not intended to replace advice given to you by your health care provider. Make sure you discuss any questions you have with your health care provider. Document Released: 04/21/2015 Document Revised: 12/13/2015 Document Reviewed: 01/24/2015 Elsevier Interactive Patient Education  2017 [place] Prevention in the Home Falls can cause injuries. They can happen to people of all ages. There are many things you can do to make your home safe and to help prevent falls. What can I do on the outside of my home?  Regularly fix the edges of walkways and driveways and fix any cracks.  Remove anything that might make you trip as you walk through a door, such as a raised step or threshold.  Trim any bushes or trees on the path to your home.  Use bright outdoor lighting.  Clear any walking paths of anything that might make someone trip, such as rocks or tools.  Regularly check to see if handrails are loose or broken. Make sure that both sides of any steps have handrails.  Any raised decks and porches should have guardrails on the edges.  Have any leaves, snow, or ice cleared regularly.  Use sand or salt on walking paths during winter.  Clean up any spills in your garage right away. This includes oil or grease spills. What can I do in the bathroom?  Use night lights.  Install grab bars by the toilet and in the tub and shower. Do not use towel bars as grab bars.  Use non-skid mats or decals in the tub or shower.  If you need to sit down in the shower, use a plastic, non-slip stool.  Keep the floor dry. Clean up any water that spills on the floor as soon as it happens.  Remove soap buildup in the tub or shower regularly.  Attach bath mats  securely with double-sided non-slip rug tape.  Do not have throw rugs and other things on the floor that can make you trip. What can I do in the bedroom?  Use night lights.  Make sure that you have a light by your bed that is easy to reach.  Do not use any sheets or blankets that are too big for your bed. They should not hang down onto the floor.  Have a firm chair that has side arms. You can use this for support while you get dressed.  Do not have throw rugs and other things on the floor that can make you trip. What can I do in the kitchen?  Clean up any spills right away.  Avoid walking on wet floors.  Keep items that you use a lot in easy-to-reach places.  If you need to reach something above you, use a strong step stool that has a grab bar.  Keep electrical cords out of the way.  Do not use floor polish or wax that makes floors slippery. If you must use wax, use non-skid floor wax.  Do not have throw rugs and other things on the floor that can make you trip. What can I do with my  stairs?  Do not leave any items on the stairs.  Make sure that there are handrails on both sides of the stairs and use them. Fix handrails that are broken or loose. Make sure that handrails are as long as the stairways.  Check any carpeting to make sure that it is firmly attached to the stairs. Fix any carpet that is loose or worn.  Avoid having throw rugs at the top or bottom of the stairs. If you do have throw rugs, attach them to the floor with carpet tape.  Make sure that you have a light switch at the top of the stairs and the bottom of the stairs. If you do not have them, ask someone to add them for you. What else can I do to help prevent falls?  Wear shoes that:  Do not have high heels.  Have rubber bottoms.  Are comfortable and fit you well.  Are closed at the toe. Do not wear sandals.  If you use a stepladder:  Make sure that it is fully opened. Do not climb a closed  stepladder.  Make sure that both sides of the stepladder are locked into place.  Ask someone to hold it for you, if possible.  Clearly mark and make sure that you can see:  Any grab bars or handrails.  First and last steps.  Where the edge of each step is.  Use tools that help you move around (mobility aids) if they are needed. These include:  Canes.  Walkers.  Scooters.  Crutches.  Turn on the lights when you go into a dark area. Replace any light bulbs as soon as they burn out.  Set up your furniture so you have a clear path. Avoid moving your furniture around.  If any of your floors are uneven, fix them.  If there are any pets around you, be aware of where they are.  Review your medicines with your doctor. Some medicines can make you feel dizzy. This can increase your chance of falling. Ask your doctor what other things that you can do to help prevent falls. This information is not intended to replace advice given to you by your health care provider. Make sure you discuss any questions you have with your health care provider. Document Released: 01/19/2009 Document Revised: 08/31/2015 Document Reviewed: 04/29/2014 Elsevier Interactive Patient Education  2017 [employer].

## 2019-05-13 ENCOUNTER — Encounter: Payer: Self-pay | Admitting: Family Medicine

## 2019-05-13 ENCOUNTER — Other Ambulatory Visit: Payer: Self-pay

## 2019-05-13 ENCOUNTER — Ambulatory Visit (INDEPENDENT_AMBULATORY_CARE_PROVIDER_SITE_OTHER): Payer: Medicare Other | Admitting: Family Medicine

## 2019-05-13 VITALS — BP 120/70 | HR 64 | Ht 70.0 in | Wt 203.0 lb

## 2019-05-13 DIAGNOSIS — N1831 Chronic kidney disease, stage 3a: Secondary | ICD-10-CM

## 2019-05-13 DIAGNOSIS — E1169 Type 2 diabetes mellitus with other specified complication: Secondary | ICD-10-CM | POA: Diagnosis not present

## 2019-05-13 DIAGNOSIS — E785 Hyperlipidemia, unspecified: Secondary | ICD-10-CM | POA: Diagnosis not present

## 2019-05-13 DIAGNOSIS — I10 Essential (primary) hypertension: Secondary | ICD-10-CM

## 2019-05-13 DIAGNOSIS — E1159 Type 2 diabetes mellitus with other circulatory complications: Secondary | ICD-10-CM

## 2019-05-13 DIAGNOSIS — I25810 Atherosclerosis of coronary artery bypass graft(s) without angina pectoris: Secondary | ICD-10-CM | POA: Diagnosis not present

## 2019-05-13 DIAGNOSIS — N529 Male erectile dysfunction, unspecified: Secondary | ICD-10-CM | POA: Diagnosis not present

## 2019-05-13 MED ORDER — GLIPIZIDE 10 MG PO TABS: 10.0000 mg | ORAL_TABLET | Freq: Two times a day (BID) | ORAL | 1 refills | Status: DC

## 2019-05-13 MED ORDER — METFORMIN HCL 500 MG PO TABS: 500.0000 mg | ORAL_TABLET | Freq: Two times a day (BID) | ORAL | 0 refills | Status: DC

## 2019-05-13 MED ORDER — ATORVASTATIN CALCIUM 40 MG PO TABS: 40.0000 mg | ORAL_TABLET | Freq: Every day | ORAL | 1 refills | Status: DC

## 2019-05-13 MED ORDER — SILDENAFIL CITRATE 50 MG PO TABS: 50.0000 mg | ORAL_TABLET | ORAL | 11 refills | Status: DC | PRN

## 2019-05-13 MED ORDER — LOSARTAN POTASSIUM 50 MG PO TABS: 50.0000 mg | ORAL_TABLET | Freq: Every day | ORAL | 1 refills | Status: DC

## 2019-05-13 MED ORDER — FREESTYLE LITE TEST VI STRP: ORAL_STRIP | 2 refills | Status: DC

## 2019-05-13 NOTE — Progress Notes (Signed)
Date:  05/13/2019   Name:  [name]   DOB:  May 03, 1945   MRN:  [id number]   Chief Complaint: Hyperlipidemia, Diabetes (foot exam), Hypertension, and Erectile Dysfunction  Hyperlipidemia This is a chronic problem. The current episode started more than 1 year ago. The problem is controlled. Recent lipid tests were reviewed and are normal. Exacerbating diseases include diabetes. He has no history of chronic renal disease, hypothyroidism, liver disease, obesity or nephrotic syndrome. There are no known factors aggravating his hyperlipidemia. Pertinent negatives include no chest pain, focal sensory loss, focal weakness, leg pain, myalgias or shortness of breath. Current antihyperlipidemic treatment includes statins. The current treatment provides moderate improvement of lipids. There are no compliance problems.  Risk factors for coronary artery disease include diabetes mellitus, dyslipidemia, hypertension and male sex.  Diabetes He presents for his follow-up diabetic visit. He has type 2 diabetes mellitus. There are no hypoglycemic associated symptoms. Pertinent negatives for hypoglycemia include no dizziness, headaches, nervousness/anxiousness or sweats. Pertinent negatives for diabetes include no blurred vision, no chest pain, no fatigue, no foot paresthesias, no foot ulcerations, no polydipsia, no polyphagia, no polyuria, no visual change, no weakness and no weight loss. There are no hypoglycemic complications. Symptoms are stable. Diabetic complications include heart disease. Pertinent negatives for diabetic complications include no autonomic neuropathy, CVA, impotence, nephropathy, peripheral neuropathy, PVD or retinopathy. Risk factors for coronary artery disease include hypertension, dyslipidemia and male sex. Current diabetic treatment includes oral agent (dual therapy). He is compliant with treatment all of the time. He is following a generally healthy diet. He participates in exercise  daily. His home blood glucose trend is fluctuating minimally. His breakfast blood glucose is taken between 8-9 am. His breakfast blood glucose range is generally 110-130 mg/dl. An ACE inhibitor/angiotensin II receptor blocker is being taken. He does not see a podiatrist.Eye exam is current.  Hypertension This is a chronic problem. The current episode started more than 1 year ago. The problem has been gradually improving since onset. The problem is controlled. Pertinent negatives include no anxiety, blurred vision, chest pain, headaches, malaise/fatigue, neck pain, orthopnea, palpitations, peripheral edema, PND, shortness of breath or sweats. Past treatments include angiotensin blockers. The current treatment provides moderate improvement. There are no compliance problems.  There is no history of angina, kidney disease, CAD/MI, CVA, heart failure, PVD or retinopathy. There is no history of chronic renal disease or a hypertension causing med.  Erectile Dysfunction This is a new problem. The problem has been waxing and waning since onset. The nature of his difficulty is achieving erection. He reports no anxiety. Irritative symptoms do not include frequency or urgency. Pertinent negatives include no chills, dysuria or hematuria.    Lab Results  Component Value Date   CREATININE 1.6 (A) 10/13/2018   BUN 24 (A) 10/13/2018   NA 138 10/13/2018   K 5.0 10/13/2018   CL 102 05/13/2018   CO2 22 05/13/2018   Lab Results  Component Value Date   CHOL 126 01/11/2019   HDL 38 (L) 01/11/2019   LDLCALC 72 01/11/2019   TRIG 82 01/11/2019   CHOLHDL 4.6 11/05/2017   Lab Results  Component Value Date   TSH 5.153 (H) 04/25/2015   Lab Results  Component Value Date   HGBA1C 6.9 (H) 11/10/2018     Review of Systems  Constitutional: Negative for chills, fatigue, fever, malaise/fatigue and weight loss.  HENT: Negative for drooling, ear discharge, ear pain and sore throat.   Eyes: Negative  for blurred vision.   Respiratory: Negative for cough, shortness of breath and wheezing.   Cardiovascular: Negative for chest pain, palpitations, orthopnea, leg swelling and PND.  Gastrointestinal: Negative for abdominal pain, blood in stool, constipation, diarrhea and nausea.  Endocrine: Negative for polydipsia, polyphagia and polyuria.  Genitourinary: Negative for dysuria, frequency, hematuria, impotence and urgency.  Musculoskeletal: Negative for back pain, myalgias and neck pain.  Skin: Negative for rash.  Allergic/Immunologic: Negative for environmental allergies.  Neurological: Negative for dizziness, focal weakness, weakness and headaches.  Hematological: Does not bruise/bleed easily.  Psychiatric/Behavioral: Negative for suicidal ideas. The patient is not nervous/anxious.     Patient Active Problem List   Diagnosis Date Noted  . Pyuria 01/30/2019  . Stage 3 chronic kidney disease 01/30/2019  . Beta thalassemia minor 03/20/2016  . Anemia 03/06/2016  . 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 Thrombocytopenia ([place]) 04/20/2015  . CAD (coronary artery disease) 04/19/2015  . Coronary artery disease 04/18/2015  . Unstable angina ([place]) 04/17/2015  . Elevated troponin 04/17/2015  . Type 2 diabetes mellitus ([place]) 04/17/2015  . Essential (primary) hypertension 04/17/2015    No Known Allergies  Past Surgical History:  Procedure Laterality Date  . CARDIAC CATHETERIZATION N/A 04/18/2015   Procedure: Left Heart Cath and Coronary Angiography;  Surgeon: Yolonda Kida, MD;  Location: [facility];  Service: Cardiovascular;  Laterality: N/A;  . COLONOSCOPY  2014   cleared for 10 yrs  . CORONARY ARTERY BYPASS GRAFT N/A 04/21/2015   Procedure: CORONARY ARTERY BYPASS GRAFTING (CABG) X 4 UTILIZING THE LEFT INTERNAL MAMMARY ARTERY TO LAD, ENDOSCOPICALLY HARVESTED RIGHT SAPHENEOUS VEINS TO PD,DIAGONAL AND CIRCUMFLEX.;   Surgeon: Grace Isaac, MD;  Location: [facility];  Service: Open Heart Surgery;  Laterality: N/A;  . DUPUYTREN CONTRACTURE RELEASE Left ~ 2000  . EYE SURGERY Bilateral    cataract removed  . KNEE ARTHROSCOPY Right ~ 1995  . TEE WITHOUT CARDIOVERSION N/A 04/21/2015   Procedure: TRANSESOPHAGEAL ECHOCARDIOGRAM (TEE);  Surgeon: Grace Isaac, MD;  Location: [facility];  Service: Open Heart Surgery;  Laterality: N/A;    Social History   Tobacco Use  . Smoking status: Never Smoker  . Smokeless tobacco: Never Used  Substance Use Topics  . Alcohol use: Yes    Alcohol/week: 0.0 standard drinks    Comment: 04/19/2015 "might have a drink a couple times/yr; at most"  . Drug use: No     Medication list has been reviewed and updated.  Current Meds  Medication Sig  . aspirin 81 MG tablet Take 1 tablet (81 mg total) by mouth daily.  Marland Kitchen atorvastatin (LIPITOR) 40 MG tablet Take 1 tablet (40 mg total) by mouth daily.  Marland Kitchen glipiZIDE (GLUCOTROL) 10 MG tablet Take 1 tablet (10 mg total) by mouth 2 (two) times daily before a meal.  . glucose blood (FREESTYLE LITE) test strip USE TO TEST EVERY DAY  . losartan (COZAAR) 50 MG tablet Take 1 tablet (50 mg total) by mouth daily.  . metFORMIN (GLUCOPHAGE) 500 MG tablet Take 1 tablet (500 mg total) by mouth 2 (two) times daily with a meal.  . sildenafil (VIAGRA) 50 MG tablet Take 1 tablet (50 mg total) by mouth as needed.    PHQ 2/9 Scores 05/13/2019 05/03/2019 11/10/2018 05/13/2018  PHQ - 2 Score 0 0 0 0  PHQ- 9 Score 0 - 0 0    BP  Readings from Last 3 Encounters:  05/13/19 120/70  05/03/19 132/78  11/10/18 130/70    Physical Exam Vitals and nursing note reviewed.  HENT:     Head: Normocephalic.     Right Ear: Tympanic membrane, ear canal and external ear normal.     Left Ear: Tympanic membrane, ear canal and external ear normal.     Nose: Nose normal. No congestion.  Eyes:     General: No scleral icterus.       Right eye: No discharge.        Left eye:  No discharge.     Conjunctiva/sclera: Conjunctivae normal.     Pupils: Pupils are equal, round, and reactive to light.  Neck:     Thyroid: No thyromegaly.     Vascular: No JVD.     Trachea: No tracheal deviation.  Cardiovascular:     Rate and Rhythm: Normal rate and regular rhythm.     Pulses: Normal pulses.          Carotid pulses are 2+ on the right side and 2+ on the left side.      Radial pulses are 2+ on the right side and 2+ on the left side.       Femoral pulses are 2+ on the right side and 2+ on the left side.      Popliteal pulses are 2+ on the right side and 2+ on the left side.       Dorsalis pedis pulses are 2+ on the right side and 2+ on the left side.       Posterior tibial pulses are 2+ on the right side and 2+ on the left side.     Heart sounds: Normal heart sounds, S1 normal and S2 normal. No murmur. No systolic murmur. No diastolic murmur. No friction rub. No gallop. No S3 or S4 sounds.   Pulmonary:     Effort: No respiratory distress.     Breath sounds: Normal breath sounds. No decreased breath sounds, wheezing, rhonchi or rales.  Abdominal:     General: Bowel sounds are normal.     Palpations: Abdomen is soft. There is no mass.     Tenderness: There is no abdominal tenderness. There is no guarding or rebound.  Musculoskeletal:        General: No tenderness. Normal range of motion.     Cervical back: Normal range of motion and neck supple.     Right lower leg: No edema.     Left lower leg: No edema.  Feet:     Right foot:     Protective Sensation: 10 sites tested. 10 sites sensed.     Skin integrity: Skin integrity normal. No ulcer, blister, skin breakdown, erythema, warmth, callus, dry skin or fissure.     Toenail Condition: Right toenails are normal.     Left foot:     Protective Sensation: 10 sites tested. 10 sites sensed.     Skin integrity: Skin integrity normal. No ulcer, blister, skin breakdown, erythema, warmth, callus, dry skin or fissure.     Toenail  Condition: Left toenails are normal.  Lymphadenopathy:     Cervical: No cervical adenopathy.  Skin:    General: Skin is warm.     Findings: No rash.  Neurological:     Mental Status: He is alert and oriented to person, place, and time.     Cranial Nerves: No cranial nerve deficit.     Deep Tendon Reflexes: Reflexes are normal  and symmetric.     Wt Readings from Last 3 Encounters:  05/13/19 203 lb (92.1 kg)  05/03/19 207 lb 3.2 oz (94 kg)  11/10/18 205 lb (93 kg)    BP 120/70   Pulse 64   Ht 5\' 10"  (1.778 m)   Wt 203 lb (92.1 kg)   BMI 29.13 kg/m   Assessment and Plan:  1. Type 2 diabetes mellitus with other circulatory complication, without long-term current use of insulin (HCC) Chronic.  Controlled.  Stable.  Continue glipizide 10 mg twice a day, and Metformin 500 mg twice a day.  We will further evaluate with A1c and renal function panel. - glucose blood (FREESTYLE LITE) test strip; USE TO TEST EVERY DAY  Dispense: 100 each; Refill: 2 - glipiZIDE (GLUCOTROL) 10 MG tablet; Take 1 tablet (10 mg total) by mouth 2 (two) times daily before a meal.  Dispense: 180 tablet; Refill: 1 - metFORMIN (GLUCOPHAGE) 500 MG tablet; Take 1 tablet (500 mg total) by mouth 2 (two) times daily with a meal.  Dispense: 180 tablet; Refill: 0 - Hemoglobin A1c - Renal Function Panel  2. Essential hypertension Chronic.  Controlled.  Stable.  Continue losartan 50 mg once a day.  Will check renal function panel. - losartan (COZAAR) 50 MG tablet; Take 1 tablet (50 mg total) by mouth daily.  Dispense: 90 tablet; Refill: 1 - Renal Function Panel  3. Hyperlipidemia associated with type 2 diabetes mellitus (HCC) Chronic.  Controlled.  Stable.  Will continue atorvastatin 40 mg once a day.  And check with lipid panel. - atorvastatin (LIPITOR) 40 MG tablet; Take 1 tablet (40 mg total) by mouth daily.  Dispense: 90 tablet; Refill: 1 - Lipid Panel With LDL/HDL Ratio  4. Stage 3a chronic kidney disease Stage  III chronic kidney disease currently stable on current regimen we will continue to monitor along with control of diabetes and hypertension.  5. Erectile dysfunction, unspecified erectile dysfunction type Patient previous history erectile dysfunction which is controlled adequately with sildenafil 50 mg 1 tablet as needed. - sildenafil (VIAGRA) 50 MG tablet; Take 1 tablet (50 mg total) by mouth as needed.  Dispense: 7 tablet; Refill: 11  6. CAD of autologous bypass graft Chronic.  Followed by Dr. Tyler Aas shows.  Patient will continue follow-up with cardiology as needed but currently is without chest pain or other cardiac concerns.

## 2019-05-14 LAB — LIPID PANEL WITH LDL/HDL RATIO
Cholesterol, Total: 145 mg/dL (ref 100–199)
HDL: 36 mg/dL — ABNORMAL LOW (ref >39)
LDL Chol Calc (NIH): 88 mg/dL (ref 0–99)
LDL/HDL Ratio: 2.4 ratio (ref 0.0–3.6)
Triglycerides: 114 mg/dL (ref 0–149)
VLDL Cholesterol Cal: 21 mg/dL (ref 5–40)

## 2019-05-14 LAB — RENAL FUNCTION PANEL
Albumin: 4.6 g/dL (ref 3.7–4.7)
BUN/Creatinine Ratio: 14 (ref 10–24)
BUN: 26 mg/dL (ref 8–27)
CO2: 23 mmol/L (ref 20–29)
Calcium: 9.8 mg/dL (ref 8.6–10.2)
Chloride: 104 mmol/L (ref 96–106)
Creatinine, Ser: 1.85 mg/dL — ABNORMAL HIGH (ref 0.76–1.27)
GFR calc Af Amer: 41 mL/min/{1.73_m2} — ABNORMAL LOW (ref >59)
GFR calc non Af Amer: 35 mL/min/{1.73_m2} — ABNORMAL LOW (ref >59)
Glucose: 117 mg/dL — ABNORMAL HIGH (ref 65–99)
Phosphorus: 2.8 mg/dL (ref 2.8–4.1)
Potassium: 5.1 mmol/L (ref 3.5–5.2)
Sodium: 142 mmol/L (ref 134–144)

## 2019-05-14 LAB — HEMOGLOBIN A1C
Est. average glucose Bld gHb Est-mCnc: 163 mg/dL
Hgb A1c MFr Bld: 7.3 % — ABNORMAL HIGH (ref 4.8–5.6)

## 2019-05-22 ENCOUNTER — Ambulatory Visit: Payer: Medicare Other | Attending: Internal Medicine

## 2019-05-22 DIAGNOSIS — Z23 Encounter for immunization: Secondary | ICD-10-CM | POA: Insufficient documentation

## 2019-05-22 NOTE — Progress Notes (Signed)
   Covid-19 Vaccination Clinic  Name:  [name]    MRN: [id number] DOB: [birth year]  05/22/2019  [name] was observed post Covid-19 immunization for 15 minutes without incidence. He was provided with Vaccine Information Sheet and instruction to access the V-Safe system.   [name] was instructed to call 911 with any severe reactions post vaccine: Marland Kitchen Difficulty breathing  . Swelling of your face and throat  . A fast heartbeat  . A bad rash all over your body  . Dizziness and weakness    Immunizations Administered    Name Date Dose VIS Date Route   Pfizer COVID-19 Vaccine 05/22/2019 11:12 AM 0.3 mL 03/19/2019 Intramuscular   Manufacturer: [place]   Lot: [id number]   [place]: [id number]

## 2019-05-31 DIAGNOSIS — E119 Type 2 diabetes mellitus without complications: Secondary | ICD-10-CM | POA: Diagnosis not present

## 2019-05-31 DIAGNOSIS — Z961 Presence of intraocular lens: Secondary | ICD-10-CM | POA: Diagnosis not present

## 2019-05-31 LAB — HM DIABETES EYE EXAM

## 2019-06-07 DIAGNOSIS — D631 Anemia in chronic kidney disease: Secondary | ICD-10-CM | POA: Diagnosis not present

## 2019-06-07 DIAGNOSIS — I10 Essential (primary) hypertension: Secondary | ICD-10-CM | POA: Diagnosis not present

## 2019-06-07 DIAGNOSIS — E1122 Type 2 diabetes mellitus with diabetic chronic kidney disease: Secondary | ICD-10-CM | POA: Diagnosis not present

## 2019-06-07 DIAGNOSIS — N1832 Chronic kidney disease, stage 3b: Secondary | ICD-10-CM | POA: Diagnosis not present

## 2019-06-07 DIAGNOSIS — R809 Proteinuria, unspecified: Secondary | ICD-10-CM | POA: Diagnosis not present

## 2019-06-09 ENCOUNTER — Other Ambulatory Visit: Payer: Self-pay

## 2019-06-12 ENCOUNTER — Ambulatory Visit: Payer: Medicare Other | Attending: Internal Medicine

## 2019-06-12 ENCOUNTER — Other Ambulatory Visit: Payer: Self-pay

## 2019-06-12 DIAGNOSIS — Z23 Encounter for immunization: Secondary | ICD-10-CM | POA: Insufficient documentation

## 2019-06-12 NOTE — Progress Notes (Signed)
   Covid-19 Vaccination Clinic  Name:  [name]    MRN: [id number] DOB: [birth year]  06/12/2019  [name] was observed post Covid-19 immunization for 15 minutes without incident. He was provided with Vaccine Information Sheet and instruction to access the V-Safe system.   [name] was instructed to call 911 with any severe reactions post vaccine: Marland Kitchen Difficulty breathing  . Swelling of face and throat  . A fast heartbeat  . A bad rash all over body  . Dizziness and weakness   Immunizations Administered    Name Date Dose VIS Date Route   Pfizer COVID-19 Vaccine 06/12/2019 11:15 AM 0.3 mL 03/19/2019 Intramuscular   Manufacturer: [place]   Lot: [id number]   [place]: [id number]

## 2019-07-27 ENCOUNTER — Other Ambulatory Visit: Payer: Self-pay

## 2019-07-27 ENCOUNTER — Ambulatory Visit
Admission: EM | Admit: 2019-07-27 | Discharge: 2019-07-27 | Disposition: A | Discharge status: Home / Self Care | Payer: Medicare Other | Attending: Family Medicine | Admitting: Family Medicine

## 2019-07-27 DIAGNOSIS — S0501XA Injury of conjunctiva and corneal abrasion without foreign body, right eye, initial encounter: Secondary | ICD-10-CM

## 2019-07-27 DIAGNOSIS — W312XXA Contact with powered woodworking and forming machines, initial encounter: Secondary | ICD-10-CM | POA: Diagnosis not present

## 2019-07-27 MED ORDER — CIPROFLOXACIN HCL 0.3 % OP SOLN: OPHTHALMIC | 0 refills | Status: DC

## 2019-07-27 MED ORDER — KETOROLAC TROMETHAMINE 0.5 % OP SOLN: 1.0000 [drp] | Freq: Four times a day (QID) | OPHTHALMIC | 0 refills | Status: AC

## 2019-07-27 NOTE — ED Provider Notes (Signed)
[facility]    CSN: [id number] Arrival date & time: 07/27/19  [place]  History   Chief Complaint Chief Complaint  Patient presents with  . Foreign Body in [place]    right   HPI  74 year old male presents with the above complaints.   Patient reports that he was cutting plywood without protection.  He states that some wood flew into his right eye.  The right eye is painful, red.  It is watering.  He has flushed the eye without resolution.  Rates his pain as 4/10 in severity.  Describes the pain as a burning sensation.  No reports of acute visual changes.  No relieving factors.  No other associated symptoms.  No other complaints.  Past Medical History:  Diagnosis Date  . Coronary artery disease   . Hyperlipidemia   . Hypertension   . Type II diabetes mellitus [facility])     Patient Active Problem List   Diagnosis Date Noted  . Hyperlipidemia associated with type 2 diabetes mellitus ([place]) 05/13/2019  . Erectile dysfunction 05/13/2019  . Pyuria 01/30/2019  . Stage 3 chronic kidney disease 01/30/2019  . Beta thalassemia minor 03/20/2016  . Anemia 03/06/2016  . 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 Thrombocytopenia ([place]) 04/20/2015  . CAD (coronary artery disease) 04/19/2015  . Coronary artery disease 04/18/2015  . Unstable angina ([place]) 04/17/2015  . Elevated troponin 04/17/2015  . Type 2 diabetes mellitus ([place]) 04/17/2015  . Essential hypertension 04/17/2015    Past Surgical History:  Procedure Laterality Date  . CARDIAC CATHETERIZATION N/A 04/18/2015   Procedure: Left Heart Cath and Coronary Angiography;  Surgeon: Yolonda Kida, MD;  Location: [place] [facility];  Service: Cardiovascular;  Laterality: N/A;  . COLONOSCOPY  2014   cleared for 10 yrs  . CORONARY ARTERY BYPASS GRAFT N/A 04/21/2015   Procedure: CORONARY ARTERY BYPASS GRAFTING (CABG) X 4 UTILIZING THE LEFT INTERNAL  MAMMARY ARTERY TO LAD, ENDOSCOPICALLY HARVESTED RIGHT SAPHENEOUS VEINS TO PD,DIAGONAL AND CIRCUMFLEX.;  Surgeon: Grace Isaac, MD;  Location: [place];  Service: Open Heart Surgery;  Laterality: N/A;  . DUPUYTREN CONTRACTURE RELEASE Left ~ 2000  . EYE SURGERY Bilateral    cataract removed  . KNEE ARTHROSCOPY Right ~ 1995  . TEE WITHOUT CARDIOVERSION N/A 04/21/2015   Procedure: TRANSESOPHAGEAL ECHOCARDIOGRAM (TEE);  Surgeon: Grace Isaac, MD;  Location: [place];  Service: Open Heart Surgery;  Laterality: N/A;       Home Medications    Prior to Admission medications   Medication Sig Start Date End Date Taking? Authorizing Provider  aspirin 81 MG tablet Take 1 tablet (81 mg total) by mouth daily. 11/05/17  Yes Juline Patch, MD  atorvastatin (LIPITOR) 40 MG tablet Take 1 tablet (40 mg total) by mouth daily. 05/13/19  Yes Juline Patch, MD  glipiZIDE (GLUCOTROL) 10 MG tablet Take 1 tablet (10 mg total) by mouth 2 (two) times daily before a meal. 05/13/19  Yes Otilio Miu C, MD  glucose blood (FREESTYLE LITE) test strip USE TO TEST EVERY DAY 05/13/19  Yes Juline Patch, MD  losartan (COZAAR) 50 MG tablet Take 1 tablet (50 mg total) by mouth daily. 05/13/19  Yes Juline Patch, MD  metFORMIN (GLUCOPHAGE) 500 MG tablet Take 1 tablet (500 mg total) by mouth 2 (two) times daily with a meal. 05/13/19  Yes Ronnald Ramp, Deanna C,  MD  sildenafil (VIAGRA) 50 MG tablet Take 1 tablet (50 mg total) by mouth as needed. 05/13/19  Yes Juline Patch, MD  ciprofloxacin (CILOXAN) 0.3 % ophthalmic solution Administer 1 drop, every 2 hours, while awake, for 2 days. Then 1 drop, every 4 hours, while awake, for the next 5 days. 07/27/19   Coral Spikes, DO  ketorolac (ACULAR) 0.5 % ophthalmic solution Place 1 drop into both eyes every 6 (six) hours for 5 days. 07/27/19 08/01/19  Coral Spikes, DO    Family History Family History  Problem Relation Age of Onset  . Diabetes Mother   . Heart attack Other   . Hypertension  Other     Social History Social History   Tobacco Use  . Smoking status: Never Smoker  . Smokeless tobacco: Never Used  Substance Use Topics  . Alcohol use: Yes    Alcohol/week: 0.0 standard drinks    Comment: 04/19/2015 "might have a drink a couple times/yr; at most"  . Drug use: No     Allergies   Patient has no known allergies.   Review of Systems Review of Systems  Constitutional: Negative.   Eyes: Positive for pain and redness.   Physical Exam Triage Vital Signs ED Triage Vitals  Enc Vitals Group     BP 07/27/19 1854 126/72     Pulse Rate 07/27/19 1854 72     Resp --      Temp 07/27/19 1854 98.1 F (36.7 C)     Temp Source 07/27/19 1854 Oral     SpO2 07/27/19 1854 99 %     Weight 07/27/19 1853 196 lb (88.9 kg)     Height 07/27/19 1853 5\' 10"  (1.778 m)     Head Circumference --      Peak Flow --      Pain Score 07/27/19 1853 4     Pain Loc --      Pain Edu? --      Excl. in [place]? --    Updated Vital Signs BP 126/72 (BP Location: Left Arm)   Pulse 72   Temp 98.1 F (36.7 C) (Oral)   Ht 5\' 10"  (1.778 m)   Wt 88.9 kg   SpO2 99%   BMI 28.12 kg/m   Visual Acuity Right Eye Distance:   Left Eye Distance:   Bilateral Distance:    Right Eye Near:   Left Eye Near:    Bilateral Near:     Physical Exam Vitals and nursing note reviewed.  Constitutional:      General: He is not in acute distress.    Appearance: Normal appearance. He is not ill-appearing.  HENT:     Head: Normocephalic and atraumatic.  Eyes:      Comments: Corneal abrasion noted with fluorescein examination at the level location.  No appreciable foreign body.  Patient had improvement with tetracaine.  Mild conjunctival injection of the right eye.  Cardiovascular:     Rate and Rhythm: Normal rate and regular rhythm.     Heart sounds: No murmur.  Pulmonary:     Effort: Pulmonary effort is normal.     Breath sounds: Normal breath sounds. No wheezing, rhonchi or rales.  Neurological:      Mental Status: He is alert.  Psychiatric:        Mood and Affect: Mood normal.        Behavior: Behavior normal.    UC Treatments / Results  Labs (all labs ordered  are listed, but only abnormal results are displayed) Labs Reviewed - No data to display  EKG   Radiology No results found.  Procedures Procedures (including critical care time)  Medications Ordered in UC Medications - No data to display  Initial Impression / Assessment and Plan / UC Course  I have reviewed the triage vital signs and the nursing notes.  Pertinent labs & imaging results that were available during my care of the patient were reviewed by me and considered in my medical decision making (see chart for details).    74 year old male presents with a corneal abrasion.  Treating with Cipro eyedrops as well as ketorolac eyedrops.  Advised to call his ophthalmologist in the morning for an appointment.  Final Clinical Impressions(s) / UC Diagnoses   Final diagnoses:  Abrasion of right cornea, initial encounter     Discharge Instructions     Medication as prescribed.  Call Eye doctor tomorrow AM.  Take care  Dr. Lacinda Axon    ED Prescriptions    Medication Sig Bowers. Provider   ciprofloxacin (CILOXAN) 0.3 % ophthalmic solution Administer 1 drop, every 2 hours, while awake, for 2 days. Then 1 drop, every 4 hours, while awake, for the next 5 days. 5 mL Jerri Glauser G, DO   ketorolac (ACULAR) 0.5 % ophthalmic solution Place 1 drop into both eyes every 6 (six) hours for 5 days. 5 mL Coral Spikes, DO     PDMP not reviewed this encounter.   Coral Spikes, [place] 07/27/19 1939

## 2019-07-27 NOTE — Discharge Instructions (Signed)
Medication as prescribed.  Call Eye doctor tomorrow AM.  Take care  Dr. Lacinda Axon

## 2019-07-27 NOTE — ED Triage Notes (Signed)
Pt presents with c/o possible foreign object in the right eye. Pt states he was cutting wood today and thinks some splinters may have gotten in his eye. Pt states the eye is painful, red, watery and irritated. Pt denies any visual changes.

## 2019-07-28 DIAGNOSIS — E119 Type 2 diabetes mellitus without complications: Secondary | ICD-10-CM | POA: Diagnosis not present

## 2019-07-28 DIAGNOSIS — T1511XA Foreign body in conjunctival sac, right eye, initial encounter: Secondary | ICD-10-CM | POA: Diagnosis not present

## 2019-07-28 DIAGNOSIS — Z961 Presence of intraocular lens: Secondary | ICD-10-CM | POA: Diagnosis not present

## 2019-07-30 DIAGNOSIS — T1511XD Foreign body in conjunctival sac, right eye, subsequent encounter: Secondary | ICD-10-CM | POA: Diagnosis not present

## 2019-08-12 ENCOUNTER — Other Ambulatory Visit: Payer: Self-pay | Admitting: Family Medicine

## 2019-08-12 DIAGNOSIS — E1159 Type 2 diabetes mellitus with other circulatory complications: Secondary | ICD-10-CM

## 2019-09-13 ENCOUNTER — Ambulatory Visit (INDEPENDENT_AMBULATORY_CARE_PROVIDER_SITE_OTHER): Payer: Medicare Other | Admitting: Family Medicine

## 2019-09-13 ENCOUNTER — Other Ambulatory Visit: Payer: Self-pay

## 2019-09-13 ENCOUNTER — Encounter: Payer: Self-pay | Admitting: Family Medicine

## 2019-09-13 VITALS — BP 120/68 | HR 64 | Ht 70.0 in | Wt 197.0 lb

## 2019-09-13 DIAGNOSIS — M1811 Unilateral primary osteoarthritis of first carpometacarpal joint, right hand: Secondary | ICD-10-CM | POA: Diagnosis not present

## 2019-09-13 DIAGNOSIS — E1169 Type 2 diabetes mellitus with other specified complication: Secondary | ICD-10-CM

## 2019-09-13 DIAGNOSIS — E785 Hyperlipidemia, unspecified: Secondary | ICD-10-CM | POA: Diagnosis not present

## 2019-09-13 DIAGNOSIS — I10 Essential (primary) hypertension: Secondary | ICD-10-CM | POA: Diagnosis not present

## 2019-09-13 DIAGNOSIS — I25810 Atherosclerosis of coronary artery bypass graft(s) without angina pectoris: Secondary | ICD-10-CM | POA: Diagnosis not present

## 2019-09-13 DIAGNOSIS — E1159 Type 2 diabetes mellitus with other circulatory complications: Secondary | ICD-10-CM | POA: Diagnosis not present

## 2019-09-13 MED ORDER — MELOXICAM 15 MG PO TABS: 15.0000 mg | ORAL_TABLET | Freq: Every day | ORAL | 0 refills | Status: DC

## 2019-09-13 NOTE — Progress Notes (Signed)
Date:  09/13/2019   Name:  [name]   DOB:  September 30, 1945   MRN:  [id number]   Chief Complaint: Hyperlipidemia (85-140/ foot exam), Diabetes, Hypertension, and joint pain hand (thumb of R) hand)  Hyperlipidemia This is a chronic problem. The current episode started more than 1 year ago. The problem is controlled. Recent lipid tests were reviewed and are normal. He has no history of chronic renal disease, diabetes, hypothyroidism, liver disease, obesity or nephrotic syndrome. There are no known factors aggravating his hyperlipidemia. Pertinent negatives include no chest pain, focal sensory loss, focal weakness, leg pain, myalgias or shortness of breath. He is currently on no antihyperlipidemic treatment. The current treatment provides moderate improvement of lipids. There are no compliance problems.   Diabetes He presents for his follow-up diabetic visit. He has type 2 diabetes mellitus. His disease course has been stable. There are no hypoglycemic associated symptoms. Pertinent negatives for hypoglycemia include no dizziness, headaches, nervousness/anxiousness or sweats. There are no diabetic associated symptoms. Pertinent negatives for diabetes include no blurred vision, no chest pain, no fatigue, no foot paresthesias, no foot ulcerations, no polydipsia, no polyphagia, no polyuria, no visual change, no weakness and no weight loss. There are no hypoglycemic complications. Symptoms are stable. There are no diabetic complications. Pertinent negatives for diabetic complications include no CVA, PVD or retinopathy. Risk factors for coronary artery disease include male sex and dyslipidemia. Current diabetic treatment includes oral agent (dual therapy). He is compliant with treatment some of the time. His weight is fluctuating minimally. He is following a generally healthy diet. Meal planning includes avoidance of concentrated sweets and carbohydrate counting. His breakfast blood glucose is taken between  8-9 am. His breakfast blood glucose range is generally 90-110 mg/dl. An ACE inhibitor/angiotensin II receptor blocker is being taken.  Hypertension This is a chronic problem. The current episode started more than 1 year ago. The problem has been waxing and waning since onset. The problem is controlled. Pertinent negatives include no anxiety, blurred vision, chest pain, headaches, malaise/fatigue, neck pain, orthopnea, palpitations, peripheral edema, shortness of breath or sweats. There are no associated agents to hypertension. Risk factors for coronary artery disease include diabetes mellitus and dyslipidemia. Past treatments include angiotensin blockers. There are no compliance problems.  There is no history of angina, kidney disease, CAD/MI, CVA, heart failure, left ventricular hypertrophy, PVD or retinopathy. There is no history of chronic renal disease, a hypertension causing med or renovascular disease.  Hand Pain  The incident occurred more than 1 week ago. There was no injury mechanism. The pain is present in the right fingers (MP right thumb). The quality of the pain is described as aching. The pain is at a severity of 5/10. The pain is moderate. Pertinent negatives include no chest pain. Treatments tried: otc voltaren. The treatment provided mild relief.    Lab Results  Component Value Date   CREATININE 1.85 (H) 05/13/2019   BUN 26 05/13/2019   NA 142 05/13/2019   K 5.1 05/13/2019   CL 104 05/13/2019   CO2 23 05/13/2019   Lab Results  Component Value Date   CHOL 145 05/13/2019   HDL 36 (L) 05/13/2019   LDLCALC 88 05/13/2019   TRIG 114 05/13/2019   CHOLHDL 4.6 11/05/2017   Lab Results  Component Value Date   TSH 5.153 (H) 04/25/2015   Lab Results  Component Value Date   HGBA1C 7.3 (H) 05/13/2019   Lab Results  Component Value Date  WBC 5.1 03/06/2016   HGB 12.4 (A) 10/13/2018   HCT 40 (A) 10/13/2018   MCV 62.5 (L) 03/06/2016   PLT 169 10/13/2018   Lab Results    Component Value Date   ALT 19 01/11/2019   AST 13 01/11/2019   ALKPHOS 71 01/11/2019   BILITOT 1.1 01/11/2019     Review of Systems  Constitutional: Negative for chills, fatigue, fever, malaise/fatigue and weight loss.  HENT: Negative for drooling, ear discharge, ear pain and sore throat.   Eyes: Negative for blurred vision.  Respiratory: Negative for cough, shortness of breath and wheezing.   Cardiovascular: Negative for chest pain, palpitations, orthopnea and leg swelling.  Gastrointestinal: Negative for abdominal pain, blood in stool, constipation, diarrhea and nausea.  Endocrine: Negative for polydipsia, polyphagia and polyuria.  Genitourinary: Negative for dysuria, frequency, hematuria and urgency.  Musculoskeletal: Negative for back pain, myalgias and neck pain.  Skin: Negative for rash.  Allergic/Immunologic: Negative for environmental allergies.  Neurological: Negative for dizziness, focal weakness, weakness and headaches.  Hematological: Does not bruise/bleed easily.  Psychiatric/Behavioral: Negative for suicidal ideas. The patient is not nervous/anxious.     Patient Active Problem List   Diagnosis Date Noted  . Hyperlipidemia associated with type 2 diabetes mellitus ([place]) 05/13/2019  . Erectile dysfunction 05/13/2019  . Pyuria 01/30/2019  . Stage 3 chronic kidney disease 01/30/2019  . Beta thalassemia minor 03/20/2016  . Anemia 03/06/2016  . 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 Thrombocytopenia ([place]) 04/20/2015  . CAD (coronary artery disease) 04/19/2015  . Coronary artery disease 04/18/2015  . Unstable angina ([place]) 04/17/2015  . Elevated troponin 04/17/2015  . Type 2 diabetes mellitus ([place]) 04/17/2015  . Essential hypertension 04/17/2015    No Known Allergies  Past Surgical History:  Procedure Laterality Date  . CARDIAC CATHETERIZATION N/A 04/18/2015   Procedure: Left  Heart Cath and Coronary Angiography;  Surgeon: Yolonda Kida, MD;  Location: [place] [facility];  Service: Cardiovascular;  Laterality: N/A;  . COLONOSCOPY  2014   cleared for 10 yrs  . CORONARY ARTERY BYPASS GRAFT N/A 04/21/2015   Procedure: CORONARY ARTERY BYPASS GRAFTING (CABG) X 4 UTILIZING THE LEFT INTERNAL MAMMARY ARTERY TO LAD, ENDOSCOPICALLY HARVESTED RIGHT SAPHENEOUS VEINS TO PD,DIAGONAL AND CIRCUMFLEX.;  Surgeon: Grace Isaac, MD;  Location: [place];  Service: Open Heart Surgery;  Laterality: N/A;  . DUPUYTREN CONTRACTURE RELEASE Left ~ 2000  . EYE SURGERY Bilateral    cataract removed  . KNEE ARTHROSCOPY Right ~ 1995  . TEE WITHOUT CARDIOVERSION N/A 04/21/2015   Procedure: TRANSESOPHAGEAL ECHOCARDIOGRAM (TEE);  Surgeon: Grace Isaac, MD;  Location: [place];  Service: Open Heart Surgery;  Laterality: N/A;    Social History   Tobacco Use  . Smoking status: Never Smoker  . Smokeless tobacco: Never Used  Substance Use Topics  . Alcohol use: Yes    Alcohol/week: 0.0 standard drinks    Comment: 04/19/2015 "might have a drink a couple times/yr; at most"  . Drug use: No     Medication list has been reviewed and updated.  Current Meds  Medication Sig  . aspirin 81 MG tablet Take 1 tablet (81 mg total) by mouth daily.  Marland Kitchen atorvastatin (LIPITOR) 40 MG tablet Take 1 tablet (40 mg total) by mouth daily.  Marland Kitchen glipiZIDE (GLUCOTROL) 10 MG tablet Take 1 tablet (10 mg total) by mouth 2 (two) times daily  before a meal.  . glucose blood (FREESTYLE LITE) test strip USE TO TEST EVERY DAY  . losartan (COZAAR) 50 MG tablet Take 1 tablet (50 mg total) by mouth daily.  . metFORMIN (GLUCOPHAGE) 500 MG tablet TAKE 1 TABLET (500 MG TOTAL) BY MOUTH 2 (TWO) TIMES DAILY WITH A MEAL.  . sildenafil (VIAGRA) 50 MG tablet Take 1 tablet (50 mg total) by mouth as needed.    PHQ 2/9 Scores 09/13/2019 05/13/2019 05/03/2019 11/10/2018  PHQ - 2 Score 0 0 0 0  PHQ- 9 Score 0 0 - 0    BP Readings from Last  3 Encounters:  09/13/19 120/68  07/27/19 126/72  05/13/19 120/70    Physical Exam Vitals and nursing note reviewed.  HENT:     Head: Normocephalic.     Right Ear: Tympanic membrane and external ear normal.     Left Ear: Tympanic membrane and external ear normal.     Nose: Nose normal.  Eyes:     General: No scleral icterus.       Right eye: No discharge.        Left eye: No discharge.     Conjunctiva/sclera: Conjunctivae normal.     Pupils: Pupils are equal, round, and reactive to light.  Neck:     Thyroid: No thyromegaly.     Vascular: No JVD.     Trachea: No tracheal deviation.  Cardiovascular:     Rate and Rhythm: Normal rate and regular rhythm.     Pulses:          Dorsalis pedis pulses are 1+ on the right side and 1+ on the left side.       Posterior tibial pulses are 1+ on the right side and 1+ on the left side.     Heart sounds: Normal heart sounds, S1 normal and S2 normal. No murmur. No systolic murmur. No diastolic murmur. No friction rub. No gallop. No S3 or S4 sounds.   Pulmonary:     Effort: No respiratory distress.     Breath sounds: Normal breath sounds. No wheezing or rales.  Abdominal:     General: Bowel sounds are normal.     Palpations: Abdomen is soft. There is no mass.     Tenderness: There is no abdominal tenderness. There is no guarding or rebound.  Musculoskeletal:        General: No tenderness. Normal range of motion.     Cervical back: Normal range of motion and neck supple.     Right lower leg: No edema.     Left lower leg: No edema.     Right foot: Normal range of motion.  Feet:     Right foot:     Protective Sensation: 10 sites tested. 10 sites sensed.     Left foot:     Protective Sensation: 10 sites tested. 10 sites sensed.  Lymphadenopathy:     Cervical: No cervical adenopathy.  Skin:    General: Skin is warm.     Findings: No rash.  Neurological:     Mental Status: He is alert and oriented to person, place, and time.     Cranial  Nerves: No cranial nerve deficit.     Deep Tendon Reflexes: Reflexes are normal and symmetric.     Wt Readings from Last 3 Encounters:  09/13/19 197 lb (89.4 kg)  07/27/19 196 lb (88.9 kg)  05/13/19 203 lb (92.1 kg)    BP 120/68   Pulse 64  Ht 5\' 10"  (1.778 m)   Wt 197 lb (89.4 kg)   BMI 28.27 kg/m   Assessment and Plan: 1. Arthritis of carpometacarpal (CMC) joint of right thumb New onset.  Persistent.  Uncontrolled.  Patient has pain at the base of his right thumb since he started building a shed shed for storage.  Patient has tried some topical Voltaren with minimal relief we will initiate some meloxicam 15 mg once a day for 30 days as needed. - meloxicam (MOBIC) 15 MG tablet; Take 1 tablet (15 mg total) by mouth daily.  Dispense: 30 tablet; Refill: 0  2. Type 2 diabetes mellitus with other circulatory complication, without long-term current use of insulin (HCC) Chronic.  Controlled.  Stable.  Foot exam was noted to be normal today.  Patient's blood sugars have been in the 90-1 10 range.  We will obtain an A1c and microalbuminuria. - HgB A1c - Microalbumin, urine  3. Essential hypertension Chronic.  Controlled.  Stable.  Patient will continue current losartan 50 mg daily.  4. Hyperlipidemia associated with type 2 diabetes mellitus (HCC) Chronic.  Controlled.  Stable.  Continue atorvastatin 40 mg daily.

## 2019-09-14 LAB — HEMOGLOBIN A1C
Est. average glucose Bld gHb Est-mCnc: 148 mg/dL
Hgb A1c MFr Bld: 6.8 % — ABNORMAL HIGH (ref 4.8–5.6)

## 2019-09-14 LAB — MICROALBUMIN, URINE: Microalbumin, Urine: 11.6 ug/mL

## 2019-10-04 DIAGNOSIS — N1831 Chronic kidney disease, stage 3a: Secondary | ICD-10-CM | POA: Diagnosis not present

## 2019-10-04 DIAGNOSIS — E1122 Type 2 diabetes mellitus with diabetic chronic kidney disease: Secondary | ICD-10-CM | POA: Diagnosis not present

## 2019-10-25 ENCOUNTER — Other Ambulatory Visit: Payer: Self-pay | Admitting: Family Medicine

## 2019-10-25 DIAGNOSIS — M1811 Unilateral primary osteoarthritis of first carpometacarpal joint, right hand: Secondary | ICD-10-CM

## 2019-11-17 DIAGNOSIS — L578 Other skin changes due to chronic exposure to nonionizing radiation: Secondary | ICD-10-CM | POA: Diagnosis not present

## 2019-11-17 DIAGNOSIS — L570 Actinic keratosis: Secondary | ICD-10-CM | POA: Diagnosis not present

## 2019-11-17 DIAGNOSIS — Z872 Personal history of diseases of the skin and subcutaneous tissue: Secondary | ICD-10-CM | POA: Diagnosis not present

## 2019-11-17 DIAGNOSIS — L821 Other seborrheic keratosis: Secondary | ICD-10-CM | POA: Diagnosis not present

## 2019-12-01 ENCOUNTER — Other Ambulatory Visit: Payer: Self-pay | Admitting: Family Medicine

## 2019-12-01 DIAGNOSIS — E1159 Type 2 diabetes mellitus with other circulatory complications: Secondary | ICD-10-CM

## 2019-12-01 NOTE — Telephone Encounter (Signed)
Requested Prescriptions  Pending Prescriptions Disp Refills   metFORMIN (GLUCOPHAGE) 500 MG tablet [Pharmacy Med Name: METFORMIN HCL 500 MG TABLET] 180 tablet 0    Sig: TAKE 1 TABLET (500 MG TOTAL) BY MOUTH 2 (TWO) TIMES DAILY WITH A MEAL.     Endocrinology:  Diabetes - Biguanides Failed - 12/01/2019  2:21 AM      Failed - Cr in normal range and within 360 days    Creatinine  Date Value Ref Range Status  07/16/2011 1.00 0.60 - 1.30 mg/dL Final   Creatinine, Ser  Date Value Ref Range Status  05/13/2019 1.85 (H) 0.76 - 1.27 mg/dL Final         Failed - eGFR in normal range and within 360 days    EGFR (African American)  Date Value Ref Range Status  07/16/2011 >60 >92m/min Final   GFR calc Af Amer  Date Value Ref Range Status  05/13/2019 41 (L) >59 mL/min/1.73 Final   EGFR (Non-African Amer.)  Date Value Ref Range Status  07/16/2011 >60 >673mmin Final    Comment:    eGFR values <6029min/1.73 m2 may be an indication of chronic kidney disease (CKD). Calculated eGFR, using the MRDR Study equation, is useful in  patients with stable renal function. The eGFR calculation will not be reliable in acutely ill patients when serum creatinine is changing rapidly. It is not useful in patients on dialysis. The eGFR calculation may not be applicable to patients at the low and high extremes of body sizes, pregnant women, and vegetarians.    GFR calc non Af Amer  Date Value Ref Range Status  05/13/2019 35 (L) >59 mL/min/1.73 Final         Passed - HBA1C is between 0 and 7.9 and within 180 days    Hgb A1c MFr Bld  Date Value Ref Range Status  09/13/2019 6.8 (H) 4.8 - 5.6 % Final    Comment:             Prediabetes: 5.7 - 6.4          Diabetes: >6.4          Glycemic control for adults with diabetes: <7.0          Passed - Valid encounter within last 6 months    Recent Outpatient Visits          2 months [facility]   6 months ago Type 2 diabetes mellitus with other circulatory complication, without long-term current use of insulin ([facility]   1 year ago Type 2 diabetes mellitus with other circulatory complication, without long-term current use of insulin ([facility] [facility]   1 year ago Type 2 diabetes mellitus with other circulatory complication, without long-term current use of insulin ([facility] [facility]   2 years ago Essential hypertension   [facility]      Future Appointments            In 1 month [facility]        Patient is being followed by Renal Specialist.  Last Creatinine was obtained in June 2021 and value was 1.67.

## 2019-12-31 ENCOUNTER — Other Ambulatory Visit: Payer: Self-pay | Admitting: Family Medicine

## 2019-12-31 DIAGNOSIS — E1159 Type 2 diabetes mellitus with other circulatory complications: Secondary | ICD-10-CM

## 2019-12-31 DIAGNOSIS — I10 Essential (primary) hypertension: Secondary | ICD-10-CM

## 2020-01-10 DIAGNOSIS — Z23 Encounter for immunization: Secondary | ICD-10-CM | POA: Diagnosis not present

## 2020-01-17 ENCOUNTER — Ambulatory Visit (INDEPENDENT_AMBULATORY_CARE_PROVIDER_SITE_OTHER): Payer: Medicare Other | Admitting: Family Medicine

## 2020-01-17 ENCOUNTER — Encounter: Payer: Self-pay | Admitting: Family Medicine

## 2020-01-17 ENCOUNTER — Other Ambulatory Visit: Payer: Self-pay

## 2020-01-17 VITALS — BP 120/80 | HR 88 | Ht 70.0 in | Wt 196.0 lb

## 2020-01-17 DIAGNOSIS — I25810 Atherosclerosis of coronary artery bypass graft(s) without angina pectoris: Secondary | ICD-10-CM

## 2020-01-17 DIAGNOSIS — E1159 Type 2 diabetes mellitus with other circulatory complications: Secondary | ICD-10-CM

## 2020-01-17 MED ORDER — METFORMIN HCL 500 MG PO TABS: 500.0000 mg | ORAL_TABLET | Freq: Two times a day (BID) | ORAL | 1 refills | Status: DC

## 2020-01-17 MED ORDER — GLIPIZIDE 10 MG PO TABS: 10.0000 mg | ORAL_TABLET | Freq: Two times a day (BID) | ORAL | 0 refills | Status: DC

## 2020-01-17 NOTE — Progress Notes (Signed)
Date:  01/17/2020   Name:  [name]   DOB:  [birth year]   MRN:  [id number]   Chief Complaint: Diabetes and Hip Pain (hurts mostly at night when laying on that side or walking)  Diabetes He presents for his follow-up diabetic visit. He has type 2 diabetes mellitus. His disease course has been stable. There are no hypoglycemic associated symptoms. Pertinent negatives for hypoglycemia include no dizziness, headaches or nervousness/anxiousness. There are no diabetic associated symptoms. Pertinent negatives for diabetes include no blurred vision, no chest pain, no fatigue, no foot paresthesias, no foot ulcerations, no polydipsia, no polyphagia, no polyuria, no visual change, no weakness and no weight loss. There are no hypoglycemic complications. Symptoms are stable. There are no diabetic complications. Risk factors for coronary artery disease include dyslipidemia and hypertension. Current diabetic treatment includes oral agent (dual therapy) (metformen/glipizide). He is compliant with treatment all of the time. His weight is stable. He is following a generally healthy diet. Meal planning includes avoidance of concentrated sweets and carbohydrate counting. He has had a previous visit with a dietitian. He participates in exercise daily. His home blood glucose trend is fluctuating minimally. His breakfast blood glucose is taken between 8-9 am. His breakfast blood glucose range is generally 90-110 mg/dl. An ACE inhibitor/angiotensin II receptor blocker is being taken. He does not see a podiatrist.Eye exam is current.  Hip Pain  The incident occurred more than 1 week ago. There was no injury mechanism. The pain is present in the left hip. The quality of the pain is described as aching. The pain is mild. Associated symptoms include tingling. Pertinent negatives include no inability to bear weight, loss of motion, loss of sensation, muscle weakness or numbness. The symptoms are aggravated by weight  bearing. He has tried NSAIDs for the symptoms. The treatment provided moderate relief.    Lab Results  Component Value Date   CREATININE 1.85 (H) 05/13/2019   BUN 26 05/13/2019   NA 142 05/13/2019   K 5.1 05/13/2019   CL 104 05/13/2019   CO2 23 05/13/2019   Lab Results  Component Value Date   CHOL 145 05/13/2019   HDL 36 (L) 05/13/2019   LDLCALC 88 05/13/2019   TRIG 114 05/13/2019   CHOLHDL 4.6 11/05/2017   Lab Results  Component Value Date   TSH 5.153 (H) 04/25/2015   Lab Results  Component Value Date   HGBA1C 6.8 (H) 09/13/2019   Lab Results  Component Value Date   WBC 5.1 03/06/2016   HGB 12.4 (A) 10/13/2018   HCT 40 (A) 10/13/2018   MCV 62.5 (L) 03/06/2016   PLT 169 10/13/2018   Lab Results  Component Value Date   ALT 19 01/11/2019   AST 13 01/11/2019   ALKPHOS 71 01/11/2019   BILITOT 1.1 01/11/2019     Review of Systems  Constitutional: Negative for chills, fatigue, fever and weight loss.  HENT: Negative for drooling, ear discharge, ear pain and sore throat.   Eyes: Negative for blurred vision.  Respiratory: Negative for cough, shortness of breath and wheezing.   Cardiovascular: Negative for chest pain, palpitations and leg swelling.  Gastrointestinal: Negative for abdominal pain, blood in stool, constipation, diarrhea and nausea.  Endocrine: Negative for polydipsia, polyphagia and polyuria.  Genitourinary: Negative for dysuria, frequency, hematuria and urgency.  Musculoskeletal: Positive for arthralgias. Negative for back pain, myalgias and neck pain.  Skin: Negative for rash.  Allergic/Immunologic: Negative for environmental allergies.  Neurological: Positive for tingling.  Negative for dizziness, weakness, numbness and headaches.  Hematological: Does not bruise/bleed easily.  Psychiatric/Behavioral: Negative for suicidal ideas. The patient is not nervous/anxious.     Patient Active Problem List   Diagnosis Date Noted  . Hyperlipidemia associated  with type 2 diabetes mellitus ([place]) 05/13/2019  . Erectile dysfunction 05/13/2019  . Pyuria 01/30/2019  . Stage 3 chronic kidney disease ([place]) 01/30/2019  . Beta thalassemia minor 03/20/2016  . Anemia 03/06/2016  . 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 Thrombocytopenia ([place]) 04/20/2015  . CAD (coronary artery disease) 04/19/2015  . Coronary artery disease 04/18/2015  . Unstable angina ([place]) 04/17/2015  . Elevated troponin 04/17/2015  . Type 2 diabetes mellitus ([place]) 04/17/2015  . Essential hypertension 04/17/2015    No Known Allergies  Past Surgical History:  Procedure Laterality Date  . CARDIAC CATHETERIZATION N/A 04/18/2015   Procedure: Left Heart Cath and Coronary Angiography;  Surgeon: Yolonda Kida, MD;  Location: [place] [facility];  Service: Cardiovascular;  Laterality: N/A;  . COLONOSCOPY  2014   cleared for 10 yrs  . CORONARY ARTERY BYPASS GRAFT N/A 04/21/2015   Procedure: CORONARY ARTERY BYPASS GRAFTING (CABG) X 4 UTILIZING THE LEFT INTERNAL MAMMARY ARTERY TO LAD, ENDOSCOPICALLY HARVESTED RIGHT SAPHENEOUS VEINS TO PD,DIAGONAL AND CIRCUMFLEX.;  Surgeon: Grace Isaac, MD;  Location: [place];  Service: Open Heart Surgery;  Laterality: N/A;  . DUPUYTREN CONTRACTURE RELEASE Left ~ 2000  . EYE SURGERY Bilateral    cataract removed  . KNEE ARTHROSCOPY Right ~ 1995  . TEE WITHOUT CARDIOVERSION N/A 04/21/2015   Procedure: TRANSESOPHAGEAL ECHOCARDIOGRAM (TEE);  Surgeon: Grace Isaac, MD;  Location: [place];  Service: Open Heart Surgery;  Laterality: N/A;    Social History   Tobacco Use  . Smoking status: Never Smoker  . Smokeless tobacco: Never Used  Vaping Use  . Vaping Use: Never used  Substance Use Topics  . Alcohol use: Yes    Alcohol/week: 0.0 standard drinks    Comment: 04/19/2015 "might have a drink a couple times/yr; at most"  . Drug use: No     Medication list has  been reviewed and updated.  Current Meds  Medication Sig  . aspirin 81 MG tablet Take 1 tablet (81 mg total) by mouth daily.  Marland Kitchen atorvastatin (LIPITOR) 40 MG tablet Take 1 tablet (40 mg total) by mouth daily.  Marland Kitchen glipiZIDE (GLUCOTROL) 10 MG tablet TAKE 1 TABLET (10 MG TOTAL) BY MOUTH 2 (TWO) TIMES DAILY BEFORE A MEAL.  Marland Kitchen glucose blood (FREESTYLE LITE) test strip USE TO TEST EVERY DAY  . losartan (COZAAR) 50 MG tablet TAKE 1 TABLET BY MOUTH EVERY DAY  . metFORMIN (GLUCOPHAGE) 500 MG tablet TAKE 1 TABLET (500 MG TOTAL) BY MOUTH 2 (TWO) TIMES DAILY WITH A MEAL.  . sildenafil (VIAGRA) 50 MG tablet Take 1 tablet (50 mg total) by mouth as needed.    PHQ 2/9 Scores 01/17/2020 09/13/2019 05/13/2019 05/03/2019  PHQ - 2 Score 0 0 0 0  PHQ- 9 Score 0 0 0 -    GAD 7 : Generalized Anxiety Score 01/17/2020 09/13/2019 05/13/2019  Nervous, Anxious, on Edge 0 0 0  Control/stop worrying 0 0 0  Worry too much - different things 0 0 0  Trouble relaxing 0 0 0  Restless 0 0 0  Easily annoyed or irritable 0 0 0  Afraid - awful might happen 0  0 0  Total GAD 7 Score 0 0 0  Anxiety Difficulty - - Not difficult at all    BP Readings from Last 3 Encounters:  01/17/20 120/80  09/13/19 120/68  07/27/19 126/72    Physical Exam Vitals and nursing note reviewed.  HENT:     Head: Normocephalic.     Right Ear: Tympanic membrane, ear canal and external ear normal. There is no impacted cerumen.     Left Ear: Tympanic membrane, ear canal and external ear normal. There is no impacted cerumen.     Nose: Nose normal. No congestion or rhinorrhea.     Mouth/Throat:     Mouth: Mucous membranes are moist.  Eyes:     General: No scleral icterus.       Right eye: No discharge.        Left eye: No discharge.     Conjunctiva/sclera: Conjunctivae normal.     Pupils: Pupils are equal, round, and reactive to light.  Neck:     Thyroid: No thyromegaly.     Vascular: No JVD.     Trachea: No tracheal deviation.  Cardiovascular:      Rate and Rhythm: Normal rate and regular rhythm.     Heart sounds: Normal heart sounds. No murmur heard.  No friction rub. No gallop.   Pulmonary:     Effort: No respiratory distress.     Breath sounds: Normal breath sounds. No wheezing, rhonchi or rales.  Chest:     Chest wall: No tenderness.  Abdominal:     General: Bowel sounds are normal.     Palpations: Abdomen is soft. There is no mass.     Tenderness: There is no abdominal tenderness. There is no guarding or rebound.  Musculoskeletal:        General: No tenderness. Normal range of motion.     Cervical back: Normal range of motion and neck supple.  Lymphadenopathy:     Cervical: No cervical adenopathy.  Skin:    General: Skin is warm.     Capillary Refill: Capillary refill takes less than 2 seconds.     Findings: No rash.  Neurological:     Mental Status: He is alert and oriented to person, place, and time.     Cranial Nerves: No cranial nerve deficit.     Deep Tendon Reflexes: Reflexes are normal and symmetric.     Wt Readings from Last 3 Encounters:  01/17/20 196 lb (88.9 kg)  09/13/19 197 lb (89.4 kg)  07/27/19 196 lb (88.9 kg)    BP 120/80   Pulse 88   Ht 5\' 10"  (1.778 m)   Wt 196 lb (88.9 kg)   BMI 28.12 kg/m   Assessment and Plan: 1. Type 2 diabetes mellitus with other circulatory complication, without long-term current use of insulin (HCC) Chronic.  Controlled.  Stable.  Last A1c was noted to be 6.8 and fasting blood glucoses at home are in the 100-1 20 range.  Patient is tolerating current regimen and will continue glipizide 10 mg twice a day and Metformin 500 mg twice a day.  Will check A1c today to assess current status.  And will recheck in 4 to 6 months. - HgB A1c - glipiZIDE (GLUCOTROL) 10 MG tablet; Take 1 tablet (10 mg total) by mouth 2 (two) times daily before a meal.  Dispense: 180 tablet; Refill: 0 - metFORMIN (GLUCOPHAGE) 500 MG tablet; Take 1 tablet (500 mg total) by mouth 2 (two) times  daily with  a meal.  Dispense: 180 tablet; Refill: 1

## 2020-01-18 DIAGNOSIS — E1159 Type 2 diabetes mellitus with other circulatory complications: Secondary | ICD-10-CM | POA: Diagnosis not present

## 2020-01-19 LAB — HEMOGLOBIN A1C
Est. average glucose Bld gHb Est-mCnc: 148 mg/dL
Hgb A1c MFr Bld: 6.8 % — ABNORMAL HIGH (ref 4.8–5.6)

## 2020-01-21 DIAGNOSIS — Z23 Encounter for immunization: Secondary | ICD-10-CM | POA: Diagnosis not present

## 2020-02-14 DIAGNOSIS — E1122 Type 2 diabetes mellitus with diabetic chronic kidney disease: Secondary | ICD-10-CM | POA: Diagnosis not present

## 2020-02-14 DIAGNOSIS — I10 Essential (primary) hypertension: Secondary | ICD-10-CM | POA: Diagnosis not present

## 2020-02-14 DIAGNOSIS — R809 Proteinuria, unspecified: Secondary | ICD-10-CM | POA: Diagnosis not present

## 2020-02-14 DIAGNOSIS — N1832 Chronic kidney disease, stage 3b: Secondary | ICD-10-CM | POA: Diagnosis not present

## 2020-02-14 DIAGNOSIS — D631 Anemia in chronic kidney disease: Secondary | ICD-10-CM | POA: Diagnosis not present

## 2020-02-19 ENCOUNTER — Other Ambulatory Visit: Payer: Self-pay | Admitting: Family Medicine

## 2020-02-19 DIAGNOSIS — E785 Hyperlipidemia, unspecified: Secondary | ICD-10-CM

## 2020-02-19 DIAGNOSIS — E1169 Type 2 diabetes mellitus with other specified complication: Secondary | ICD-10-CM

## 2020-02-19 NOTE — Telephone Encounter (Signed)
Requested Prescriptions  Pending Prescriptions Disp Refills  . atorvastatin (LIPITOR) 40 MG tablet [Pharmacy Med Name: ATORVASTATIN 40 MG TABLET] 90 tablet 1    Sig: TAKE 1 TABLET BY MOUTH EVERY DAY     Cardiovascular:  Antilipid - Statins Failed - 02/19/2020 12:19 PM      Failed - LDL in normal range and within 360 days    LDL Chol Calc (NIH)  Date Value Ref Range Status  05/13/2019 88 0 - 99 mg/dL Final         Failed - HDL in normal range and within 360 days    HDL  Date Value Ref Range Status  05/13/2019 36 (L) >39 mg/dL Final         Passed - Total Cholesterol in normal range and within 360 days    Cholesterol, Total  Date Value Ref Range Status  05/13/2019 145 100 - 199 mg/dL Final         Passed - Triglycerides in normal range and within 360 days    Triglycerides  Date Value Ref Range Status  05/13/2019 114 0 - 149 mg/dL Final         Passed - Patient is not pregnant      Passed - Valid encounter within last 12 months    Recent Outpatient Visits          1 month ago Type 2 diabetes mellitus with other circulatory complication, without long-term current use of insulin ([place])   [facility] Juline Patch, MD   5 months ago Arthritis of carpometacarpal [facility]) joint of right thumb   [facility] Juline Patch, MD   9 months ago Type 2 diabetes mellitus with other circulatory complication, without long-term current use of insulin ([place])   [facility] Juline Patch, MD   1 year ago Type 2 diabetes mellitus with other circulatory complication, without long-term current use of insulin ([place])   [facility] Juline Patch, MD   1 year ago Type 2 diabetes mellitus with other circulatory complication, without long-term current use of insulin ([place])   [facility] Juline Patch, MD      Future Appointments            In 2 months Juline Patch, MD [facility]

## 2020-03-09 ENCOUNTER — Other Ambulatory Visit: Payer: Self-pay | Admitting: Family Medicine

## 2020-03-09 DIAGNOSIS — E1159 Type 2 diabetes mellitus with other circulatory complications: Secondary | ICD-10-CM

## 2020-04-05 ENCOUNTER — Other Ambulatory Visit: Payer: Self-pay | Admitting: Family Medicine

## 2020-04-05 DIAGNOSIS — I10 Essential (primary) hypertension: Secondary | ICD-10-CM

## 2020-05-03 ENCOUNTER — Other Ambulatory Visit: Payer: Self-pay

## 2020-05-03 ENCOUNTER — Ambulatory Visit (INDEPENDENT_AMBULATORY_CARE_PROVIDER_SITE_OTHER): Payer: Medicare Other

## 2020-05-03 VITALS — BP 120/66 | HR 77 | Temp 97.6°F | Resp 16 | Ht 70.0 in | Wt 199.0 lb

## 2020-05-03 DIAGNOSIS — Z0000 Encounter for general adult medical examination without abnormal findings: Secondary | ICD-10-CM

## 2020-05-03 NOTE — Progress Notes (Signed)
Subjective:   [name] is a 75 y.o. male who presents for Medicare Annual/Subsequent preventive examination.  Review of Systems     Cardiac Risk Factors include: advanced age (>41men, >39 women);diabetes mellitus;dyslipidemia;male gender;hypertension     Objective:    Today's Vitals   05/03/20 0948  BP: 120/66  Pulse: 77  Resp: 16  Temp: 97.6 F (36.4 C)  TempSrc: Oral  SpO2: 97%  Weight: 199 lb (90.3 kg)  Height: 5\' 10"  (1.778 m)   Body mass index is 28.55 kg/m.  Advanced Directives 05/03/2020 05/03/2019 03/20/2016 03/06/2016 06/29/2015 04/20/2015 04/19/2015  Does Patient Have a Medical Advance Directive? Yes Yes Yes Yes Yes Yes No  Type of Paramedic of [place];Living will [place];Living will - - [place];Living will - -  Copy of [place] in Chart? Yes - validated most recent copy scanned in chart (See row information) Yes - validated most recent copy scanned in chart (See row information) - - No - copy requested Yes -  Would patient like information on creating a medical advance directive? - - - - - - Yes - Educational materials given    Current Medications (verified) Outpatient Encounter Medications as of 05/03/2020  Medication Sig  . aspirin 81 MG tablet Take 1 tablet (81 mg total) by mouth daily.  Marland Kitchen atorvastatin (LIPITOR) 40 MG tablet TAKE 1 TABLET BY MOUTH EVERY DAY  . FREESTYLE LITE test strip USE TO TEST EVERY DAY  . glipiZIDE (GLUCOTROL) 10 MG tablet Take 1 tablet (10 mg total) by mouth 2 (two) times daily before a meal.  . losartan (COZAAR) 50 MG tablet TAKE 1 TABLET BY MOUTH EVERY DAY  . metFORMIN (GLUCOPHAGE) 500 MG tablet Take 1 tablet (500 mg total) by mouth 2 (two) times daily with a meal.  . sildenafil (VIAGRA) 50 MG tablet Take 1 tablet (50 mg total) by mouth as needed.   No facility-administered encounter medications on file as of 05/03/2020.    Allergies  (verified) Patient has no known allergies.   History: Past Medical History:  Diagnosis Date  . Coronary artery disease   . Hyperlipidemia   . Hypertension   . Type II diabetes mellitus ([place])    Past Surgical History:  Procedure Laterality Date  . CARDIAC CATHETERIZATION N/A 04/18/2015   Procedure: Left Heart Cath and Coronary Angiography;  Surgeon: Yolonda Kida, MD;  Location: [facility];  Service: Cardiovascular;  Laterality: N/A;  . COLONOSCOPY  2014   cleared for 10 yrs  . CORONARY ARTERY BYPASS GRAFT N/A 04/21/2015   Procedure: CORONARY ARTERY BYPASS GRAFTING (CABG) X 4 UTILIZING THE LEFT INTERNAL MAMMARY ARTERY TO LAD, ENDOSCOPICALLY HARVESTED RIGHT SAPHENEOUS VEINS TO PD,DIAGONAL AND CIRCUMFLEX.;  Surgeon: Grace Isaac, MD;  Location: [facility];  Service: Open Heart Surgery;  Laterality: N/A;  . DUPUYTREN CONTRACTURE RELEASE Left ~ 2000  . EYE SURGERY Bilateral    cataract removed  . KNEE ARTHROSCOPY Right ~ 1995  . TEE WITHOUT CARDIOVERSION N/A 04/21/2015   Procedure: TRANSESOPHAGEAL ECHOCARDIOGRAM (TEE);  Surgeon: Grace Isaac, MD;  Location: [facility];  Service: Open Heart Surgery;  Laterality: N/A;   Family History  Problem Relation Age of Onset  . Diabetes Mother   . Heart attack Other   . Hypertension Other    Social History   Socioeconomic History  . Marital status: Married    Spouse name: Not on file  . Number  of children: Not on file  . Years of education: Not on file  . Highest education level: Not on file  Occupational History  . Not on file  Tobacco Use  . Smoking status: Never Smoker  . Smokeless tobacco: Never Used  Vaping Use  . Vaping Use: Never used  Substance and Sexual Activity  . Alcohol use: Not Currently    Alcohol/week: 0.0 standard drinks  . Drug use: No  . Sexual activity: Not Currently  Other Topics Concern  . Not on file  Social History Narrative  . Not on file   Social Determinants of Health   Financial  Resource Strain: Low Risk   . Difficulty of Paying Living Expenses: Not hard at all  Food Insecurity: No Food Insecurity  . Worried About Charity fundraiser in the Last Year: Never true  . Ran Out of Food in the Last Year: Never true  Transportation Needs: No Transportation Needs  . Lack of Transportation (Medical): No  . Lack of Transportation (Non-Medical): No  Physical Activity: Inactive  . Days of Exercise per Week: 0 days  . Minutes of Exercise per Session: 0 min  Stress: No Stress Concern Present  . Feeling of Stress : Not at all  Social Connections: Moderately Integrated  . Frequency of Communication with Friends and Family: More than three times a week  . Frequency of Social Gatherings with Friends and Family: More than three times a week  . Attends Religious Services: Never  . Active Member of Clubs or Organizations: Yes  . Attends Archivist Meetings: More than 4 times per year  . Marital Status: Married    Tobacco Counseling Counseling given: Not Answered   Clinical Intake:  Pre-visit preparation completed: Yes  Pain : No/denies pain     BMI - recorded: 28.55 Nutritional Status: BMI 25 -29 Overweight Nutritional Risks: None Diabetes: Yes CBG done?: No Did pt. bring in CBG monitor from home?: No  How often do you need to have someone help you when you read instructions, pamphlets, or other written materials from your doctor or pharmacy?: 1 - Never  Nutrition Risk Assessment:  Has the patient had any N/V/D within the last 2 months?  No  Does the patient have any non-healing wounds?  No  Has the patient had any unintentional weight loss or weight gain?  No   Diabetes:  Is the patient diabetic?  Yes  If diabetic, was a CBG obtained today?  No  Did the patient bring in their glucometer from home?  No  How often do you monitor your CBG's? daily.   Financial Strains and Diabetes Management:  Are you having any financial strains with the device,  your supplies or your medication? No .  Does the patient want to be seen by Chronic Care Management for management of their diabetes?  No  Would the patient like to be referred to a Nutritionist or for Diabetic Management?  No   Diabetic Exams:  Diabetic Eye Exam: Completed 05/31/19 negative retinopathy.   Diabetic Foot Exam: Completed 09/13/19.   Interpreter Needed?: No  Information entered by :: Clemetine Marker LPN   Activities of Daily Living In your present state of health, do you have any difficulty performing the following activities: 05/03/2020  Hearing? N  Comment declines hearing aids  Vision? N  Difficulty concentrating or making decisions? N  Walking or climbing stairs? N  Dressing or bathing? N  Doing errands, shopping? N  Preparing  Food and eating ? N  Using the Toilet? N  In the past six months, have you accidently leaked urine? N  Do you have problems with loss of bowel control? N  Managing your Medications? N  Managing your Finances? N  Housekeeping or managing your Housekeeping? N  Some recent data might be hidden    Patient Care Team: Juline Patch, MD as PCP - General (Family Medicine) Grace Isaac, MD as Consulting Physician (Cardiothoracic Surgery) Yolonda Kida, MD as Consulting Physician (Cardiology) Ubaldo Glassing Javier Docker, MD as Consulting Physician (Cardiology) Anthonette Legato, MD (Internal Medicine)  Indicate any recent Medical Services you may have received from other than [facility] providers in the past year (date may be approximate).     Assessment:   This is a routine wellness examination for [place].  Hearing/Vision screen  Hearing Screening   125Hz  250Hz  500Hz  1000Hz  2000Hz  3000Hz  4000Hz  6000Hz  8000Hz   Right ear:           Left ear:           Comments: Pt denies hearing difficulty  Vision Screening Comments: Annual vision screenings done [facility]  Dietary issues and exercise activities discussed: Current Exercise  Habits: The patient does not participate in regular exercise at present, Exercise limited by: None identified  Goals    . Increase physical activity     Recommend regular physical activity at least 3 days per week      Depression Screen PHQ 2/9 Scores 05/03/2020 01/17/2020 09/13/2019 05/13/2019 05/03/2019 11/10/2018 05/13/2018  PHQ - 2 Score 0 0 0 0 0 0 0  PHQ- 9 Score - 0 0 0 - 0 0    Fall Risk Fall Risk  05/03/2020 01/17/2020 09/13/2019 05/13/2019 05/03/2019  Falls in the past year? 0 0 0 0 1  Comment - - - - -  Number falls in past yr: 0 - - - 1  Injury with Fall? 0 - - - 0  Risk for fall due to : No Fall Risks - - - No Fall Risks  Follow up Falls prevention discussed Falls evaluation completed Falls evaluation completed Falls evaluation completed Falls prevention discussed    FALL RISK PREVENTION PERTAINING TO THE HOME:  Any stairs in or around the home? Yes  If so, are there any without handrails? No  Home free of loose throw rugs in walkways, pet beds, electrical cords, etc? Yes  Adequate lighting in your home to reduce risk of falls? Yes   ASSISTIVE DEVICES UTILIZED TO PREVENT FALLS:  Life alert? No  Use of a cane, walker or w/c? No  Grab bars in the bathroom? No  Shower chair or bench in shower? No  Elevated toilet seat or a handicapped toilet? No   TIMED UP AND GO:  Was the test performed? Yes .  Length of time to ambulate 10 feet: 5 sec.   Gait steady and fast without use of assistive device  Cognitive Function: Normal cognitive status assessed by direct observation by this Nurse Health Advisor. No abnormalities found.       6CIT Screen 05/03/2019  What Year? 0 points  What month? 0 points  What time? 0 points  Count back from 20 0 points  Months in reverse 0 points  Repeat phrase 0 points  Total Score 0    Immunizations Immunization History  Administered Date(s) Administered  . Influenza, High Dose Seasonal PF 03/03/2017, 01/10/2018  . Influenza-Unspecified  01/07/2015, 01/01/2019, 01/10/2020  .  PFIZER(Purple Top)SARS-COV-2 Vaccination 05/22/2019, 06/12/2019, 01/18/2020  . Pneumococcal Conjugate-13 12/01/2014  . Pneumococcal Polysaccharide-23 05/08/2017  . Tdap 04/09/2011  . Zoster 04/09/2011    TDAP status: Up to date  Flu Vaccine status: Up to date  Pneumococcal vaccine status: Up to date  Covid-19 vaccine status: Completed vaccines  Qualifies for Shingles Vaccine? Yes   Zostavax completed Yes   Shingrix Completed?: No.    Education has been provided regarding the importance of this vaccine. Patient has been advised to call insurance company to determine out of pocket expense if they have not yet received this vaccine. Advised may also receive vaccine at local pharmacy or Health Dept. Verbalized acceptance and understanding.  Screening Tests Health Maintenance  Topic Date Due  . OPHTHALMOLOGY EXAM  05/30/2020  . HEMOGLOBIN A1C  07/18/2020  . FOOT EXAM  09/12/2020  . TETANUS/TDAP  04/08/2021  . COLONOSCOPY (Pts 45-87yrs Insurance coverage will need to be confirmed)  04/28/2022  . INFLUENZA VACCINE  Completed  . COVID-19 Vaccine  Completed  . Hepatitis C Screening  Completed  . PNA vac Low Risk Adult  Completed    Health Maintenance  There are no preventive care reminders to display for this patient.  Colorectal cancer screening: Type of screening: Colonoscopy. Completed 04/28/12. Repeat every 10 years  Lung Cancer Screening: (Low Dose CT Chest recommended if Age 68-80 years, 30 pack-year currently smoking OR have quit w/in 15years.) does not qualify.   Additional Screening:  Hepatitis C Screening: does qualify; Completed 05/08/17  Vision Screening: Recommended annual ophthalmology exams for early detection of glaucoma and other disorders of the eye. Is the patient up to date with their annual eye exam?  Yes  Who is the provider or what is the name of the office in which the patient attends annual eye exams? Dr.  Atilano Median  Dental Screening: Recommended annual dental exams for proper oral hygiene  Community Resource Referral / Chronic Care Management: CRR required this visit?  No   CCM required this visit?  No      Plan:     I have personally reviewed and noted the following in the patient's chart:   . Medical and social history . Use of alcohol, tobacco or illicit drugs  . Current medications and supplements . Functional ability and status . Nutritional status . Physical activity . Advanced directives . List of other physicians . Hospitalizations, surgeries, and ER visits in previous 12 months . Vitals . Screenings to include cognitive, depression, and falls . Referrals and appointments  In addition, I have reviewed and discussed with patient certain preventive protocols, quality metrics, and best practice recommendations. A written personalized care plan for preventive services as well as general preventive health recommendations were provided to patient.     Clemetine Marker, LPN   [id number]   Nurse Notes: none

## 2020-05-03 NOTE — Patient Instructions (Signed)
[name] , Thank you for taking time to come for your Medicare Wellness Visit. I appreciate your ongoing commitment to your health goals. Please review the following plan we discussed and let me know if I can assist you in the future.   Screening recommendations/referrals: Colonoscopy: done 04/28/12.  Recommended yearly ophthalmology/optometry visit for glaucoma screening and checkup Recommended yearly dental visit for hygiene and checkup  Vaccinations: Influenza vaccine: done 01/10/20 Pneumococcal vaccine: done 05/08/17 Tdap vaccine: done 2013 Shingles vaccine: Shingrix discussed. Please contact your pharmacy for coverage information.  Covid-19:  Done 05/22/19, 06/12/19 & 01/18/20  Conditions/risks identified: Keep up the great work!  Next appointment: Follow up in one year for your annual wellness visit.   Preventive Care 75 Years and Older, Male Preventive care refers to lifestyle choices and visits with your health care provider that can promote health and wellness. What does preventive care include?  A yearly physical exam. This is also called an annual well check.  Dental exams once or twice a year.  Routine eye exams. Ask your health care provider how often you should have your eyes checked.  Personal lifestyle choices, including:  Daily care of your teeth and gums.  Regular physical activity.  Eating a healthy diet.  Avoiding tobacco and drug use.  Limiting alcohol use.  Practicing safe sex.  Taking low doses of aspirin every day.  Taking vitamin and mineral supplements as recommended by your health care provider. What happens during an annual well check? The services and screenings done by your health care provider during your annual well check will depend on your age, overall health, lifestyle risk factors, and family history of disease. Counseling  Your health care provider may ask you questions about your:  Alcohol use.  Tobacco use.  Drug  use.  Emotional well-being.  Home and relationship well-being.  Sexual activity.  Eating habits.  History of falls.  Memory and ability to understand (cognition).  Work and work Statistician. Screening  You may have the following tests or measurements:  Height, weight, and BMI.  Blood pressure.  Lipid and cholesterol levels. These may be checked every 5 years, or more frequently if you are over 75 years old.  Skin check.  Lung cancer screening. You may have this screening every year starting at age 75 if you have a 30-pack-year history of smoking and currently smoke or have quit within the past 15 years.  Fecal occult blood test (FOBT) of the stool. You may have this test every year starting at age 75.  Flexible sigmoidoscopy or colonoscopy. You may have a sigmoidoscopy every 5 years or a colonoscopy every 10 years starting at age 75.  Prostate cancer screening. Recommendations will vary depending on your family history and other risks.  Hepatitis C blood test.  Hepatitis B blood test.  Sexually transmitted disease (STD) testing.  Diabetes screening. This is done by checking your blood sugar (glucose) after you have not eaten for a while (fasting). You may have this done every 1-3 years.  Abdominal aortic aneurysm (AAA) screening. You may need this if you are a current or former smoker.  Osteoporosis. You may be screened starting at age 75 if you are at high risk. Talk with your health care provider about your test results, treatment options, and if necessary, the need for more tests. Vaccines  Your health care provider may recommend certain vaccines, such as:  Influenza vaccine. This is recommended every year.  Tetanus, diphtheria, and acellular pertussis (Tdap,  Td) vaccine. You may need a Td booster every 10 years.  Zoster vaccine. You may need this after age 75.  Pneumococcal 13-valent conjugate (PCV13) vaccine. One dose is recommended after age  75.  Pneumococcal polysaccharide (PPSV23) vaccine. One dose is recommended after age 75. Talk to your health care provider about which screenings and vaccines you need and how often you need them. This information is not intended to replace advice given to you by your health care provider. Make sure you discuss any questions you have with your health care provider. Document Released: 04/21/2015 Document Revised: 12/13/2015 Document Reviewed: 01/24/2015 Elsevier Interactive Patient Education  2017 [place] Prevention in the Home Falls can cause injuries. They can happen to people of all ages. There are many things you can do to make your home safe and to help prevent falls. What can I do on the outside of my home?  Regularly fix the edges of walkways and driveways and fix any cracks.  Remove anything that might make you trip as you walk through a door, such as a raised step or threshold.  Trim any bushes or trees on the path to your home.  Use bright outdoor lighting.  Clear any walking paths of anything that might make someone trip, such as rocks or tools.  Regularly check to see if handrails are loose or broken. Make sure that both sides of any steps have handrails.  Any raised decks and porches should have guardrails on the edges.  Have any leaves, snow, or ice cleared regularly.  Use sand or salt on walking paths during winter.  Clean up any spills in your garage right away. This includes oil or grease spills. What can I do in the bathroom?  Use night lights.  Install grab bars by the toilet and in the tub and shower. Do not use towel bars as grab bars.  Use non-skid mats or decals in the tub or shower.  If you need to sit down in the shower, use a plastic, non-slip stool.  Keep the floor dry. Clean up any water that spills on the floor as soon as it happens.  Remove soap buildup in the tub or shower regularly.  Attach bath mats securely with double-sided  non-slip rug tape.  Do not have throw rugs and other things on the floor that can make you trip. What can I do in the bedroom?  Use night lights.  Make sure that you have a light by your bed that is easy to reach.  Do not use any sheets or blankets that are too big for your bed. They should not hang down onto the floor.  Have a firm chair that has side arms. You can use this for support while you get dressed.  Do not have throw rugs and other things on the floor that can make you trip. What can I do in the kitchen?  Clean up any spills right away.  Avoid walking on wet floors.  Keep items that you use a lot in easy-to-reach places.  If you need to reach something above you, use a strong step stool that has a grab bar.  Keep electrical cords out of the way.  Do not use floor polish or wax that makes floors slippery. If you must use wax, use non-skid floor wax.  Do not have throw rugs and other things on the floor that can make you trip. What can I do with my stairs?  Do not  leave any items on the stairs.  Make sure that there are handrails on both sides of the stairs and use them. Fix handrails that are broken or loose. Make sure that handrails are as long as the stairways.  Check any carpeting to make sure that it is firmly attached to the stairs. Fix any carpet that is loose or worn.  Avoid having throw rugs at the top or bottom of the stairs. If you do have throw rugs, attach them to the floor with carpet tape.  Make sure that you have a light switch at the top of the stairs and the bottom of the stairs. If you do not have them, ask someone to add them for you. What else can I do to help prevent falls?  Wear shoes that:  Do not have high heels.  Have rubber bottoms.  Are comfortable and fit you well.  Are closed at the toe. Do not wear sandals.  If you use a stepladder:  Make sure that it is fully opened. Do not climb a closed stepladder.  Make sure that both  sides of the stepladder are locked into place.  Ask someone to hold it for you, if possible.  Clearly mark and make sure that you can see:  Any grab bars or handrails.  First and last steps.  Where the edge of each step is.  Use tools that help you move around (mobility aids) if they are needed. These include:  Canes.  Walkers.  Scooters.  Crutches.  Turn on the lights when you go into a dark area. Replace any light bulbs as soon as they burn out.  Set up your furniture so you have a clear path. Avoid moving your furniture around.  If any of your floors are uneven, fix them.  If there are any pets around you, be aware of where they are.  Review your medicines with your doctor. Some medicines can make you feel dizzy. This can increase your chance of falling. Ask your doctor what other things that you can do to help prevent falls. This information is not intended to replace advice given to you by your health care provider. Make sure you discuss any questions you have with your health care provider. Document Released: 01/19/2009 Document Revised: 08/31/2015 Document Reviewed: 04/29/2014 Elsevier Interactive Patient Education  2017 [employer].

## 2020-05-09 ENCOUNTER — Encounter: Payer: Self-pay | Admitting: Family Medicine

## 2020-05-09 ENCOUNTER — Other Ambulatory Visit: Payer: Self-pay

## 2020-05-09 ENCOUNTER — Ambulatory Visit (INDEPENDENT_AMBULATORY_CARE_PROVIDER_SITE_OTHER): Payer: Medicare Other | Admitting: Family Medicine

## 2020-05-09 DIAGNOSIS — N529 Male erectile dysfunction, unspecified: Secondary | ICD-10-CM

## 2020-05-09 DIAGNOSIS — E785 Hyperlipidemia, unspecified: Secondary | ICD-10-CM | POA: Diagnosis not present

## 2020-05-09 DIAGNOSIS — E1159 Type 2 diabetes mellitus with other circulatory complications: Secondary | ICD-10-CM | POA: Diagnosis not present

## 2020-05-09 DIAGNOSIS — I10 Essential (primary) hypertension: Secondary | ICD-10-CM | POA: Diagnosis not present

## 2020-05-09 DIAGNOSIS — E1169 Type 2 diabetes mellitus with other specified complication: Secondary | ICD-10-CM | POA: Diagnosis not present

## 2020-05-09 MED ORDER — SILDENAFIL CITRATE 50 MG PO TABS: 50.0000 mg | ORAL_TABLET | ORAL | 11 refills | Status: DC | PRN

## 2020-05-09 MED ORDER — GLIPIZIDE 10 MG PO TABS: 10.0000 mg | ORAL_TABLET | Freq: Two times a day (BID) | ORAL | 1 refills | Status: DC

## 2020-05-09 MED ORDER — LOSARTAN POTASSIUM 50 MG PO TABS: 50.0000 mg | ORAL_TABLET | Freq: Every day | ORAL | 1 refills | Status: DC

## 2020-05-09 MED ORDER — ATORVASTATIN CALCIUM 40 MG PO TABS: 40.0000 mg | ORAL_TABLET | Freq: Every day | ORAL | 1 refills | Status: DC

## 2020-05-09 MED ORDER — METFORMIN HCL 500 MG PO TABS: 500.0000 mg | ORAL_TABLET | Freq: Two times a day (BID) | ORAL | 1 refills | Status: DC

## 2020-05-09 NOTE — Progress Notes (Signed)
Date:  05/09/2020   Name:  [name]   DOB:  02-10-1946   MRN:  [id number]   Chief Complaint: Diabetes, Hyperlipidemia, Hypertension, and Erectile Dysfunction  Diabetes He presents for his follow-up diabetic visit. He has type 2 diabetes mellitus. His disease course has been stable. There are no hypoglycemic associated symptoms. Pertinent negatives for hypoglycemia include no dizziness, headaches, nervousness/anxiousness or sweats. Pertinent negatives for diabetes include no blurred vision, no chest pain, no fatigue, no foot paresthesias, no foot ulcerations, no polydipsia, no polyphagia, no polyuria, no visual change, no weakness and no weight loss. There are no hypoglycemic complications. Symptoms are stable. There are no diabetic complications. Pertinent negatives for diabetic complications include no CVA, PVD or retinopathy. Risk factors for coronary artery disease include dyslipidemia and male sex. Current diabetic treatment includes oral agent (dual therapy). He is compliant with treatment some of the time. His weight is stable. He is following a generally healthy diet. Meal planning includes carbohydrate counting and avoidance of concentrated sweets. He participates in exercise daily. His breakfast blood glucose is taken between 8-9 am. His breakfast blood glucose range is generally 90-110 mg/dl. An ACE inhibitor/angiotensin II receptor blocker is being taken. Eye exam is current.  Hyperlipidemia This is a chronic problem. The current episode started more than 1 year ago. The problem is controlled. Recent lipid tests were reviewed and are normal. He has no history of chronic renal disease. Pertinent negatives include no chest pain, myalgias or shortness of breath. The current treatment provides moderate improvement of lipids. There are no compliance problems.  There are no known risk factors for coronary artery disease.  Hypertension This is a chronic problem. The current episode started  more than 1 year ago. The problem has been gradually improving since onset. The problem is controlled. Pertinent negatives include no anxiety, blurred vision, chest pain, headaches, malaise/fatigue, neck pain, orthopnea, palpitations, peripheral edema, PND, shortness of breath or sweats. There are no associated agents to hypertension. There are no known risk factors for coronary artery disease. Past treatments include angiotensin blockers. The current treatment provides moderate improvement. There are no compliance problems.  There is no history of angina, kidney disease, CAD/MI, CVA, heart failure, left ventricular hypertrophy, PVD or retinopathy. There is no history of chronic renal disease, a hypertension causing med or renovascular disease.  Erectile Dysfunction This is a chronic problem. The current episode started more than 1 year ago. The problem has been waxing and waning since onset. The nature of his difficulty is achieving erection. He reports no anxiety, decreased libido or performance anxiety. Irritative symptoms do not include frequency or urgency. Pertinent negatives include no chills, dysuria or hematuria. Past treatments include sildenafil.    Lab Results  Component Value Date   CREATININE 1.85 (H) 05/13/2019   BUN 26 05/13/2019   NA 142 05/13/2019   K 5.1 05/13/2019   CL 104 05/13/2019   CO2 23 05/13/2019   Lab Results  Component Value Date   CHOL 145 05/13/2019   HDL 36 (L) 05/13/2019   LDLCALC 88 05/13/2019   TRIG 114 05/13/2019   CHOLHDL 4.6 11/05/2017   Lab Results  Component Value Date   TSH 5.153 (H) 04/25/2015   Lab Results  Component Value Date   HGBA1C 6.8 (H) 01/18/2020   Lab Results  Component Value Date   WBC 5.1 03/06/2016   HGB 12.4 (A) 10/13/2018   HCT 40 (A) 10/13/2018   MCV 62.5 (L) 03/06/2016  PLT 169 10/13/2018   Lab Results  Component Value Date   ALT 19 01/11/2019   AST 13 01/11/2019   ALKPHOS 71 01/11/2019   BILITOT 1.1 01/11/2019      Review of Systems  Constitutional: Negative for chills, fatigue, fever, malaise/fatigue and weight loss.  HENT: Negative for drooling, ear discharge, ear pain and sore throat.   Eyes: Negative for blurred vision.  Respiratory: Negative for cough, shortness of breath and wheezing.   Cardiovascular: Negative for chest pain, palpitations, orthopnea, leg swelling and PND.  Gastrointestinal: Negative for abdominal pain, blood in stool, constipation, diarrhea and nausea.  Endocrine: Negative for polydipsia, polyphagia and polyuria.  Genitourinary: Negative for decreased libido, dysuria, frequency, hematuria and urgency.  Musculoskeletal: Negative for back pain, myalgias and neck pain.  Skin: Negative for rash.  Allergic/Immunologic: Negative for environmental allergies.  Neurological: Negative for dizziness, weakness and headaches.  Hematological: Does not bruise/bleed easily.  Psychiatric/Behavioral: Negative for suicidal ideas. The patient is not nervous/anxious.     Patient Active Problem List   Diagnosis Date Noted  . Hyperlipidemia associated with type 2 diabetes mellitus ([place]) 05/13/2019  . Erectile dysfunction 05/13/2019  . Pyuria 01/30/2019  . Stage 3 chronic kidney disease ([place]) 01/30/2019  . Beta thalassemia minor 03/20/2016  . Anemia 03/06/2016  . 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 Thrombocytopenia ([place]) 04/20/2015  . Coronary artery disease 04/18/2015  . Unstable angina ([place]) 04/17/2015  . Elevated troponin 04/17/2015  . Type 2 diabetes mellitus ([place]) 04/17/2015  . Essential hypertension 04/17/2015    No Known Allergies  Past Surgical History:  Procedure Laterality Date  . CARDIAC CATHETERIZATION N/A 04/18/2015   Procedure: Left Heart Cath and Coronary Angiography;  Surgeon: Yolonda Kida, MD;  Location: [facility];  Service: Cardiovascular;  Laterality: N/A;   . COLONOSCOPY  2014   cleared for 10 yrs  . CORONARY ARTERY BYPASS GRAFT N/A 04/21/2015   Procedure: CORONARY ARTERY BYPASS GRAFTING (CABG) X 4 UTILIZING THE LEFT INTERNAL MAMMARY ARTERY TO LAD, ENDOSCOPICALLY HARVESTED RIGHT SAPHENEOUS VEINS TO PD,DIAGONAL AND CIRCUMFLEX.;  Surgeon: Grace Isaac, MD;  Location: [facility];  Service: Open Heart Surgery;  Laterality: N/A;  . DUPUYTREN CONTRACTURE RELEASE Left ~ 2000  . EYE SURGERY Bilateral    cataract removed  . KNEE ARTHROSCOPY Right ~ 1995  . TEE WITHOUT CARDIOVERSION N/A 04/21/2015   Procedure: TRANSESOPHAGEAL ECHOCARDIOGRAM (TEE);  Surgeon: Grace Isaac, MD;  Location: [facility];  Service: Open Heart Surgery;  Laterality: N/A;    Social History   Tobacco Use  . Smoking status: Never Smoker  . Smokeless tobacco: Never Used  Vaping Use  . Vaping Use: Never used  Substance Use Topics  . Alcohol use: Not Currently    Alcohol/week: 0.0 standard drinks  . Drug use: No     Medication list has been reviewed and updated.  Current Meds  Medication Sig  . aspirin 81 MG tablet Take 1 tablet (81 mg total) by mouth daily.  Marland Kitchen atorvastatin (LIPITOR) 40 MG tablet TAKE 1 TABLET BY MOUTH EVERY DAY  . FREESTYLE LITE test strip USE TO TEST EVERY DAY  . glipiZIDE (GLUCOTROL) 10 MG tablet Take 1 tablet (10 mg total) by mouth 2 (two) times daily before a meal.  . losartan (COZAAR) 50 MG tablet TAKE 1 TABLET BY MOUTH EVERY DAY  . metFORMIN (GLUCOPHAGE) 500 MG tablet  Take 1 tablet (500 mg total) by mouth 2 (two) times daily with a meal.  . sildenafil (VIAGRA) 50 MG tablet Take 1 tablet (50 mg total) by mouth as needed.    PHQ 2/9 Scores 05/09/2020 05/03/2020 01/17/2020 09/13/2019  PHQ - 2 Score 0 0 0 0  PHQ- 9 Score 0 - 0 0    GAD 7 : Generalized Anxiety Score 05/09/2020 01/17/2020 09/13/2019 05/13/2019  Nervous, Anxious, on Edge 0 0 0 0  Control/stop worrying 0 0 0 0  Worry too much - different things 0 0 0 0  Trouble relaxing 0 0 0 0  Restless 0 0  0 0  Easily annoyed or irritable 0 0 0 0  Afraid - awful might happen 0 0 0 0  Total GAD 7 Score 0 0 0 0  Anxiety Difficulty - - - Not difficult at all    BP Readings from Last 3 Encounters:  05/09/20 130/80  05/03/20 120/66  01/17/20 120/80    Physical Exam Vitals and nursing note reviewed.  Constitutional:      Appearance: Normal appearance.  HENT:     Head: Normocephalic.     Right Ear: Tympanic membrane, ear canal and external ear normal. There is no impacted cerumen.     Left Ear: Tympanic membrane, ear canal and external ear normal. There is no impacted cerumen.     Nose: Nose normal. No congestion or rhinorrhea.     Mouth/Throat:     Mouth: Oropharynx is clear and moist. Mucous membranes are moist.     Pharynx: Oropharynx is clear. No oropharyngeal exudate or posterior oropharyngeal erythema.  Eyes:     General: No scleral icterus.       Right eye: No discharge.        Left eye: No discharge.     Extraocular Movements: EOM normal.     Conjunctiva/sclera: Conjunctivae normal.     Pupils: Pupils are equal, round, and reactive to light.  Neck:     Thyroid: No thyromegaly.     Vascular: No JVD.     Trachea: No tracheal deviation.  Cardiovascular:     Rate and Rhythm: Normal rate and regular rhythm.     Chest Wall: PMI is not displaced. No thrill.     Pulses: Normal pulses and intact distal pulses.     Heart sounds: Normal heart sounds, S1 normal and S2 normal. No murmur heard. No friction rub. No gallop. No S3 or S4 sounds.   Pulmonary:     Effort: No respiratory distress.     Breath sounds: Normal breath sounds. No wheezing, rhonchi or rales.  Abdominal:     General: Bowel sounds are normal.     Palpations: Abdomen is soft. There is no hepatosplenomegaly or mass.     Tenderness: There is no abdominal tenderness. There is no CVA tenderness, guarding or rebound.  Musculoskeletal:        General: No tenderness or edema. Normal range of motion.     Cervical back:  Normal range of motion and neck supple.  Lymphadenopathy:     Cervical: No cervical adenopathy.  Skin:    General: Skin is warm.     Capillary Refill: Capillary refill takes less than 2 seconds.     Findings: No rash.  Neurological:     Mental Status: He is alert and oriented to person, place, and time.     Cranial Nerves: No cranial nerve deficit.     Deep Tendon  Reflexes: Strength normal and reflexes are normal and symmetric.     Wt Readings from Last 3 Encounters:  05/09/20 198 lb (89.8 kg)  05/03/20 199 lb (90.3 kg)  01/17/20 196 lb (88.9 kg)    BP 130/80   Pulse (!) 58   Ht 5\' 10"  (1.778 m)   Wt 198 lb (89.8 kg)   BMI 28.41 kg/m   Assessment and Plan:  1. Type 2 diabetes mellitus with other circulatory complication, without long-term current use of insulin (HCC) Chronic.  Controlled.  Stable.  Blood sugars fasting are in the 90-100 range.  Patient will continue Metformin 500 mg twice a day and glipizide 10 mg twice a day as well.  Will check A1c for current level of control.  Will recheck in 4 months. - glipiZIDE (GLUCOTROL) 10 MG tablet; Take 1 tablet (10 mg total) by mouth 2 (two) times daily before a meal.  Dispense: 180 tablet; Refill: 1 - metFORMIN (GLUCOPHAGE) 500 MG tablet; Take 1 tablet (500 mg total) by mouth 2 (two) times daily with a meal.  Dispense: 180 tablet; Refill: 1 - HgB A1c  2. Essential hypertension Chronic.  Controlled.  Stable.  Today's blood pressure is 130/80.  Continue losartan 50 mg once a day.  Will recheck in 4 months. - losartan (COZAAR) 50 MG tablet; Take 1 tablet (50 mg total) by mouth daily.  Dispense: 90 tablet; Refill: 1 - Comprehensive Metabolic Panel (CMET)  3. Hyperlipidemia associated with type 2 diabetes mellitus ([place]) .  Controlled.  Stable.  Continue atorvastatin 40 mg once a day. - atorvastatin (LIPITOR) 40 MG tablet; Take 1 tablet (40 mg total) by mouth daily.  Dispense: 90 tablet; Refill: 1 - Lipid Panel With LDL/HDL  Ratio  4. Erectile dysfunction, unspecified erectile dysfunction type Chronic.  Controlled.  Stable.  Continue sildenafil 50 mg 1 tablet as needed. - sildenafil (VIAGRA) 50 MG tablet; Take 1 tablet (50 mg total) by mouth as needed.  Dispense: 7 tablet; Refill: 11

## 2020-05-10 LAB — COMPREHENSIVE METABOLIC PANEL
ALT: 26 IU/L (ref 0–44)
AST: 19 IU/L (ref 0–40)
Albumin/Globulin Ratio: 2.1 (ref 1.2–2.2)
Albumin: 4.7 g/dL (ref 3.7–4.7)
Alkaline Phosphatase: 75 IU/L (ref 44–121)
BUN/Creatinine Ratio: 14 (ref 10–24)
BUN: 22 mg/dL (ref 8–27)
Bilirubin Total: 1 mg/dL (ref 0.0–1.2)
CO2: 24 mmol/L (ref 20–29)
Calcium: 9.4 mg/dL (ref 8.6–10.2)
Chloride: 104 mmol/L (ref 96–106)
Creatinine, Ser: 1.61 mg/dL — ABNORMAL HIGH (ref 0.76–1.27)
GFR calc Af Amer: 48 mL/min/{1.73_m2} — ABNORMAL LOW (ref >59)
GFR calc non Af Amer: 42 mL/min/{1.73_m2} — ABNORMAL LOW (ref >59)
Globulin, Total: 2.2 g/dL (ref 1.5–4.5)
Glucose: 106 mg/dL — ABNORMAL HIGH (ref 65–99)
Potassium: 5 mmol/L (ref 3.5–5.2)
Sodium: 141 mmol/L (ref 134–144)
Total Protein: 6.9 g/dL (ref 6.0–8.5)

## 2020-05-10 LAB — LIPID PANEL WITH LDL/HDL RATIO
Cholesterol, Total: 139 mg/dL (ref 100–199)
HDL: 43 mg/dL (ref >39)
LDL Chol Calc (NIH): 78 mg/dL (ref 0–99)
LDL/HDL Ratio: 1.8 ratio (ref 0.0–3.6)
Triglycerides: 94 mg/dL (ref 0–149)
VLDL Cholesterol Cal: 18 mg/dL (ref 5–40)

## 2020-05-10 LAB — HEMOGLOBIN A1C
Est. average glucose Bld gHb Est-mCnc: 151 mg/dL
Hgb A1c MFr Bld: 6.9 % — ABNORMAL HIGH (ref 4.8–5.6)

## 2020-06-01 ENCOUNTER — Other Ambulatory Visit: Payer: Self-pay | Admitting: Family Medicine

## 2020-06-01 DIAGNOSIS — N529 Male erectile dysfunction, unspecified: Secondary | ICD-10-CM

## 2020-06-15 DIAGNOSIS — I10 Essential (primary) hypertension: Secondary | ICD-10-CM | POA: Diagnosis not present

## 2020-06-15 DIAGNOSIS — N1832 Chronic kidney disease, stage 3b: Secondary | ICD-10-CM | POA: Diagnosis not present

## 2020-06-15 DIAGNOSIS — R809 Proteinuria, unspecified: Secondary | ICD-10-CM | POA: Diagnosis not present

## 2020-06-15 DIAGNOSIS — D631 Anemia in chronic kidney disease: Secondary | ICD-10-CM | POA: Diagnosis not present

## 2020-06-15 DIAGNOSIS — E1122 Type 2 diabetes mellitus with diabetic chronic kidney disease: Secondary | ICD-10-CM | POA: Diagnosis not present

## 2020-06-19 DIAGNOSIS — E119 Type 2 diabetes mellitus without complications: Secondary | ICD-10-CM | POA: Diagnosis not present

## 2020-06-19 DIAGNOSIS — Z961 Presence of intraocular lens: Secondary | ICD-10-CM | POA: Diagnosis not present

## 2020-06-19 LAB — HM DIABETES EYE EXAM

## 2020-07-11 ENCOUNTER — Other Ambulatory Visit: Payer: Self-pay | Admitting: Family Medicine

## 2020-07-11 DIAGNOSIS — N529 Male erectile dysfunction, unspecified: Secondary | ICD-10-CM

## 2020-07-18 ENCOUNTER — Other Ambulatory Visit: Payer: Self-pay

## 2020-07-18 ENCOUNTER — Emergency Department: Payer: Medicare Other

## 2020-07-18 ENCOUNTER — Encounter: Payer: Self-pay | Admitting: *Deleted

## 2020-07-18 ENCOUNTER — Emergency Department: Payer: Medicare Other | Admitting: Anesthesiology

## 2020-07-18 ENCOUNTER — Ambulatory Visit
Admission: EM | Admit: 2020-07-18 | Discharge: 2020-07-18 | Disposition: A | Discharge status: Home / Self Care | Payer: Medicare Other

## 2020-07-18 ENCOUNTER — Encounter
Admission: EM | Disposition: A | Discharge status: Home / Self Care | Payer: Self-pay | Source: Home / Self Care | Attending: Emergency Medicine

## 2020-07-18 ENCOUNTER — Observation Stay
Admission: EM | Admit: 2020-07-18 | Discharge: 2020-07-19 | Disposition: A | Discharge status: Home / Self Care | Payer: Medicare Other | Attending: Orthopaedic Surgery | Admitting: Orthopaedic Surgery

## 2020-07-18 DIAGNOSIS — Z7982 Long term (current) use of aspirin: Secondary | ICD-10-CM | POA: Insufficient documentation

## 2020-07-18 DIAGNOSIS — Z20822 Contact with and (suspected) exposure to covid-19: Secondary | ICD-10-CM | POA: Insufficient documentation

## 2020-07-18 DIAGNOSIS — M1712 Unilateral primary osteoarthritis, left knee: Secondary | ICD-10-CM | POA: Diagnosis not present

## 2020-07-18 DIAGNOSIS — I129 Hypertensive chronic kidney disease with stage 1 through stage 4 chronic kidney disease, or unspecified chronic kidney disease: Secondary | ICD-10-CM | POA: Diagnosis not present

## 2020-07-18 DIAGNOSIS — E119 Type 2 diabetes mellitus without complications: Secondary | ICD-10-CM

## 2020-07-18 DIAGNOSIS — I25810 Atherosclerosis of coronary artery bypass graft(s) without angina pectoris: Secondary | ICD-10-CM | POA: Diagnosis not present

## 2020-07-18 DIAGNOSIS — S81011A Laceration without foreign body, right knee, initial encounter: Secondary | ICD-10-CM

## 2020-07-18 DIAGNOSIS — S81812A Laceration without foreign body, left lower leg, initial encounter: Secondary | ICD-10-CM | POA: Insufficient documentation

## 2020-07-18 DIAGNOSIS — N1832 Chronic kidney disease, stage 3b: Secondary | ICD-10-CM | POA: Diagnosis not present

## 2020-07-18 DIAGNOSIS — I10 Essential (primary) hypertension: Secondary | ICD-10-CM | POA: Diagnosis present

## 2020-07-18 DIAGNOSIS — S062XAA Diffuse traumatic brain injury with loss of consciousness status unknown, initial encounter: Secondary | ICD-10-CM

## 2020-07-18 DIAGNOSIS — I2510 Atherosclerotic heart disease of native coronary artery without angina pectoris: Secondary | ICD-10-CM | POA: Diagnosis not present

## 2020-07-18 DIAGNOSIS — S8002XA Contusion of left knee, initial encounter: Secondary | ICD-10-CM | POA: Diagnosis not present

## 2020-07-18 DIAGNOSIS — N183 Chronic kidney disease, stage 3 unspecified: Secondary | ICD-10-CM | POA: Insufficient documentation

## 2020-07-18 DIAGNOSIS — W293XXA Contact with powered garden and outdoor hand tools and machinery, initial encounter: Secondary | ICD-10-CM | POA: Diagnosis not present

## 2020-07-18 DIAGNOSIS — S81012A Laceration without foreign body, left knee, initial encounter: Principal | ICD-10-CM | POA: Insufficient documentation

## 2020-07-18 DIAGNOSIS — S06330A Contusion and laceration of cerebrum, unspecified, without loss of consciousness, initial encounter: Secondary | ICD-10-CM | POA: Diagnosis not present

## 2020-07-18 DIAGNOSIS — S8992XA Unspecified injury of left lower leg, initial encounter: Secondary | ICD-10-CM | POA: Diagnosis present

## 2020-07-18 DIAGNOSIS — Z7984 Long term (current) use of oral hypoglycemic drugs: Secondary | ICD-10-CM | POA: Insufficient documentation

## 2020-07-18 DIAGNOSIS — E1169 Type 2 diabetes mellitus with other specified complication: Secondary | ICD-10-CM | POA: Diagnosis not present

## 2020-07-18 DIAGNOSIS — Z951 Presence of aortocoronary bypass graft: Secondary | ICD-10-CM | POA: Diagnosis not present

## 2020-07-18 DIAGNOSIS — S062X9A Diffuse traumatic brain injury with loss of consciousness of unspecified duration, initial encounter: Secondary | ICD-10-CM

## 2020-07-18 DIAGNOSIS — M7122 Synovial cyst of popliteal space [Baker], left knee: Secondary | ICD-10-CM | POA: Diagnosis not present

## 2020-07-18 DIAGNOSIS — E785 Hyperlipidemia, unspecified: Secondary | ICD-10-CM

## 2020-07-18 DIAGNOSIS — E1122 Type 2 diabetes mellitus with diabetic chronic kidney disease: Secondary | ICD-10-CM | POA: Diagnosis not present

## 2020-07-18 DIAGNOSIS — Z79899 Other long term (current) drug therapy: Secondary | ICD-10-CM | POA: Diagnosis not present

## 2020-07-18 DIAGNOSIS — W312XXA Contact with powered woodworking and forming machines, initial encounter: Secondary | ICD-10-CM | POA: Diagnosis not present

## 2020-07-18 HISTORY — PX: INCISION AND DRAINAGE: SHX5863

## 2020-07-18 LAB — COMPREHENSIVE METABOLIC PANEL
ALT: 17 U/L (ref 0–44)
AST: 21 U/L (ref 15–41)
Albumin: 4.1 g/dL (ref 3.5–5.0)
Alkaline Phosphatase: 60 U/L (ref 38–126)
Anion gap: 8 (ref 5–15)
BUN: 29 mg/dL — ABNORMAL HIGH (ref 8–23)
CO2: 22 mmol/L (ref 22–32)
Calcium: 9.1 mg/dL (ref 8.9–10.3)
Chloride: 105 mmol/L (ref 98–111)
Creatinine, Ser: 1.61 mg/dL — ABNORMAL HIGH (ref 0.61–1.24)
GFR, Estimated: 45 mL/min — ABNORMAL LOW (ref >60)
Glucose, Bld: 105 mg/dL — ABNORMAL HIGH (ref 70–99)
Potassium: 4.3 mmol/L (ref 3.5–5.1)
Sodium: 135 mmol/L (ref 135–145)
Total Bilirubin: 1.3 mg/dL — ABNORMAL HIGH (ref 0.3–1.2)
Total Protein: 6.8 g/dL (ref 6.5–8.1)

## 2020-07-18 LAB — CBC WITH DIFFERENTIAL/PLATELET
Abs Immature Granulocytes: 0.02 10*3/uL (ref 0.00–0.07)
Basophils Absolute: 0 10*3/uL (ref 0.0–0.1)
Basophils Relative: 1 %
Eosinophils Absolute: 0.2 10*3/uL (ref 0.0–0.5)
Eosinophils Relative: 3 %
HCT: 36.9 % — ABNORMAL LOW (ref 39.0–52.0)
Hemoglobin: 11.6 g/dL — ABNORMAL LOW (ref 13.0–17.0)
Immature Granulocytes: 0 %
Lymphocytes Relative: 22 %
Lymphs Abs: 1.2 10*3/uL (ref 0.7–4.0)
MCH: 19.5 pg — ABNORMAL LOW (ref 26.0–34.0)
MCHC: 31.4 g/dL (ref 30.0–36.0)
MCV: 62 fL — ABNORMAL LOW (ref 80.0–100.0)
Monocytes Absolute: 0.6 10*3/uL (ref 0.1–1.0)
Monocytes Relative: 10 %
Neutro Abs: 3.7 10*3/uL (ref 1.7–7.7)
Neutrophils Relative %: 64 %
Platelets: 138 10*3/uL — ABNORMAL LOW (ref 150–400)
RBC: 5.95 MIL/uL — ABNORMAL HIGH (ref 4.22–5.81)
RDW: 17.3 % — ABNORMAL HIGH (ref 11.5–15.5)
WBC: 5.7 10*3/uL (ref 4.0–10.5)
nRBC: 0 % (ref 0.0–0.2)

## 2020-07-18 LAB — TYPE AND SCREEN
ABO/RH(D): A POS
Antibody Screen: NEGATIVE

## 2020-07-18 LAB — CBG MONITORING, ED: Glucose-Capillary: 84 mg/dL (ref 70–99)

## 2020-07-18 LAB — RESP PANEL BY RT-PCR (FLU A&B, COVID) ARPGX2
Influenza A by PCR: NEGATIVE
Influenza B by PCR: NEGATIVE
SARS Coronavirus 2 by RT PCR: NEGATIVE

## 2020-07-18 SURGERY — INCISION AND DRAINAGE
Anesthesia: General | Laterality: Left

## 2020-07-18 MED ORDER — ACETAMINOPHEN 500 MG PO TABS
1000.0000 mg | ORAL_TABLET | Freq: Three times a day (TID) | ORAL | Status: DC
  Administered 2020-07-18 – 2020-07-19 (×3): 1000 mg via ORAL
  Filled 2020-07-18 (×3): qty 2

## 2020-07-18 MED ORDER — ASPIRIN 325 MG PO TABS
325.0000 mg | ORAL_TABLET | Freq: Every day | ORAL | Status: DC
  Administered 2020-07-19: 325 mg via ORAL
  Filled 2020-07-18: qty 1

## 2020-07-18 MED ORDER — CEFAZOLIN SODIUM-DEXTROSE 1-4 GM/50ML-% IV SOLN
1.0000 g | Freq: Once | INTRAVENOUS | Status: AC
  Administered 2020-07-18: 1 g via INTRAVENOUS
  Filled 2020-07-18: qty 50

## 2020-07-18 MED ORDER — CEFAZOLIN SODIUM-DEXTROSE 1-4 GM/50ML-% IV SOLN
INTRAVENOUS | Status: DC | PRN
  Administered 2020-07-18: 1 g via INTRAVENOUS

## 2020-07-18 MED ORDER — MORPHINE SULFATE (PF) 2 MG/ML IV SOLN
0.5000 mg | INTRAVENOUS | Status: DC | PRN
  Administered 2020-07-18: 1 mg via INTRAVENOUS
  Filled 2020-07-18: qty 1

## 2020-07-18 MED ORDER — ACETAMINOPHEN 10 MG/ML IV SOLN
INTRAVENOUS | Status: AC
  Filled 2020-07-18: qty 100

## 2020-07-18 MED ORDER — ONDANSETRON HCL 4 MG/2ML IJ SOLN
INTRAMUSCULAR | Status: DC | PRN
  Administered 2020-07-18: 4 mg via INTRAVENOUS

## 2020-07-18 MED ORDER — LOSARTAN POTASSIUM 50 MG PO TABS
50.0000 mg | ORAL_TABLET | Freq: Every day | ORAL | Status: DC
  Administered 2020-07-19: 50 mg via ORAL
  Filled 2020-07-18: qty 1

## 2020-07-18 MED ORDER — PROPOFOL 10 MG/ML IV BOLUS
INTRAVENOUS | Status: AC
  Filled 2020-07-18: qty 20

## 2020-07-18 MED ORDER — PHENYLEPHRINE HCL (PRESSORS) 10 MG/ML IV SOLN
INTRAVENOUS | Status: DC | PRN
  Administered 2020-07-18 (×4): 100 ug via INTRAVENOUS

## 2020-07-18 MED ORDER — EPHEDRINE SULFATE 50 MG/ML IJ SOLN
INTRAMUSCULAR | Status: DC | PRN
  Administered 2020-07-18 (×3): 10 mg via INTRAVENOUS

## 2020-07-18 MED ORDER — DEXMEDETOMIDINE HCL 200 MCG/2ML IV SOLN
INTRAVENOUS | Status: DC | PRN
  Administered 2020-07-18: 8 ug via INTRAVENOUS

## 2020-07-18 MED ORDER — CELECOXIB 100 MG PO CAPS
100.0000 mg | ORAL_CAPSULE | Freq: Two times a day (BID) | ORAL | Status: DC
  Administered 2020-07-18 – 2020-07-19 (×2): 100 mg via ORAL
  Filled 2020-07-18 (×3): qty 1

## 2020-07-18 MED ORDER — CEFAZOLIN SODIUM-DEXTROSE 2-4 GM/100ML-% IV SOLN
2.0000 g | Freq: Three times a day (TID) | INTRAVENOUS | Status: DC
  Administered 2020-07-19 (×2): 2 g via INTRAVENOUS
  Filled 2020-07-18 (×3): qty 100

## 2020-07-18 MED ORDER — PROPOFOL 10 MG/ML IV BOLUS
INTRAVENOUS | Status: DC | PRN
  Administered 2020-07-18: 160 mg via INTRAVENOUS

## 2020-07-18 MED ORDER — SODIUM CHLORIDE 0.9 % IV SOLN: INTRAVENOUS | Status: DC

## 2020-07-18 MED ORDER — FENTANYL CITRATE (PF) 100 MCG/2ML IJ SOLN
INTRAMUSCULAR | Status: AC
  Filled 2020-07-18: qty 2

## 2020-07-18 MED ORDER — SODIUM CHLORIDE 0.9 % IV SOLN
INTRAVENOUS | Status: DC | PRN
  Administered 2020-07-18 (×2): 3000 mL

## 2020-07-18 MED ORDER — SENNOSIDES-DOCUSATE SODIUM 8.6-50 MG PO TABS: 1.0000 | ORAL_TABLET | Freq: Every evening | ORAL | Status: DC | PRN

## 2020-07-18 MED ORDER — ATORVASTATIN CALCIUM 20 MG PO TABS
40.0000 mg | ORAL_TABLET | Freq: Every day | ORAL | Status: DC
  Administered 2020-07-19: 40 mg via ORAL
  Filled 2020-07-18: qty 2

## 2020-07-18 MED ORDER — ONDANSETRON HCL 4 MG/2ML IJ SOLN: 4.0000 mg | Freq: Once | INTRAMUSCULAR | Status: DC | PRN

## 2020-07-18 MED ORDER — FENTANYL CITRATE (PF) 100 MCG/2ML IJ SOLN
INTRAMUSCULAR | Status: DC | PRN
  Administered 2020-07-18 (×2): 50 ug via INTRAVENOUS

## 2020-07-18 MED ORDER — ACETAMINOPHEN 10 MG/ML IV SOLN
INTRAVENOUS | Status: DC | PRN
  Administered 2020-07-18: 1000 mg via INTRAVENOUS

## 2020-07-18 MED ORDER — ONDANSETRON HCL 4 MG PO TABS: 4.0000 mg | ORAL_TABLET | Freq: Four times a day (QID) | ORAL | Status: DC | PRN

## 2020-07-18 MED ORDER — LIDOCAINE-EPINEPHRINE 2 %-1:100000 IJ SOLN
20.0000 mL | Freq: Once | INTRAMUSCULAR | Status: AC
  Administered 2020-07-18: 20 mL

## 2020-07-18 MED ORDER — ROCURONIUM BROMIDE 100 MG/10ML IV SOLN
INTRAVENOUS | Status: DC | PRN
  Administered 2020-07-18: 30 mg via INTRAVENOUS

## 2020-07-18 MED ORDER — ONDANSETRON HCL 4 MG/2ML IJ SOLN: 4.0000 mg | Freq: Four times a day (QID) | INTRAMUSCULAR | Status: DC | PRN

## 2020-07-18 MED ORDER — TRAMADOL HCL 50 MG PO TABS: 50.0000 mg | ORAL_TABLET | Freq: Four times a day (QID) | ORAL | Status: DC | PRN

## 2020-07-18 MED ORDER — SUCCINYLCHOLINE CHLORIDE 20 MG/ML IJ SOLN
INTRAMUSCULAR | Status: DC | PRN
  Administered 2020-07-18: 100 mg via INTRAVENOUS

## 2020-07-18 MED ORDER — SUGAMMADEX SODIUM 200 MG/2ML IV SOLN
INTRAVENOUS | Status: DC | PRN
  Administered 2020-07-18: 160 mg via INTRAVENOUS

## 2020-07-18 MED ORDER — SODIUM CHLORIDE 0.9 % IV SOLN: INTRAVENOUS | Status: DC | PRN

## 2020-07-18 MED ORDER — FENTANYL CITRATE (PF) 100 MCG/2ML IJ SOLN: 25.0000 ug | INTRAMUSCULAR | Status: DC | PRN

## 2020-07-18 SURGICAL SUPPLY — 36 items
BNDG COHESIVE 4X5 TAN STRL (GAUZE/BANDAGES/DRESSINGS) ×2 IMPLANT
BNDG GAUZE 4.5X4.1 6PLY STRL (MISCELLANEOUS) ×2 IMPLANT
BRUSH SCRUB EZ  4% CHG (MISCELLANEOUS)
BRUSH SCRUB EZ 4% CHG (MISCELLANEOUS) IMPLANT
COVER WAND RF STERILE (DRAPES) ×2 IMPLANT
CUFF TOURN SGL QUICK 30 (TOURNIQUET CUFF) ×1
CUFF TRNQT CYL 30X4X21-28X (TOURNIQUET CUFF) ×1 IMPLANT
DRAPE 3/4 80X56 (DRAPES) IMPLANT
DRAPE INCISE IOBAN 66X60 STRL (DRAPES) ×2 IMPLANT
DRAPE SURG 17X11 SM STRL (DRAPES) IMPLANT
DRAPE U-SHAPE 47X51 STRL (DRAPES) ×2 IMPLANT
DRSG EMULSION OIL 3X8 NADH (GAUZE/BANDAGES/DRESSINGS) ×2 IMPLANT
ELECT REM PT RETURN 9FT ADLT (ELECTROSURGICAL) ×2
ELECTRODE REM PT RTRN 9FT ADLT (ELECTROSURGICAL) ×1 IMPLANT
GLOVE BIOGEL PI ORTHO PRO 7.5 (GLOVE) ×1
GLOVE PI ORTHO PRO STRL 7.5 (GLOVE) ×1 IMPLANT
GLOVE SURG ORTHO LTX SZ8 (GLOVE) IMPLANT
GLOVE SURG UNDER LTX SZ8 (GLOVE) IMPLANT
GLOVE SURG UNDER POLY LF SZ7.5 (GLOVE) ×2 IMPLANT
GOWN STRL REUS W/ TWL LRG LVL3 (GOWN DISPOSABLE) ×1 IMPLANT
GOWN STRL REUS W/ TWL XL LVL3 (GOWN DISPOSABLE) ×1 IMPLANT
GOWN STRL REUS W/TWL LRG LVL3 (GOWN DISPOSABLE) ×1
GOWN STRL REUS W/TWL XL LVL3 (GOWN DISPOSABLE) ×1
KIT PREVENA INCISION MGT 13 (CANNISTER) ×2 IMPLANT
KIT TURNOVER KIT A (KITS) ×2 IMPLANT
MANIFOLD NEPTUNE II (INSTRUMENTS) ×2 IMPLANT
NS IRRIG 1000ML POUR BTL (IV SOLUTION) ×2 IMPLANT
PACK EXTREMITY ARMC (MISCELLANEOUS) ×2 IMPLANT
PAD CAST CTTN 4X4 STRL (SOFTGOODS) ×1 IMPLANT
PADDING CAST COTTON 4X4 STRL (SOFTGOODS) ×1
STAPLER SKIN PROX 35W (STAPLE) ×2 IMPLANT
STOCKINETTE IMPERVIOUS 9X36 MD (GAUZE/BANDAGES/DRESSINGS) ×2 IMPLANT
SUT ETHILON 2 0 FS 18 (SUTURE) ×6 IMPLANT
SUT ETHILON 2 0 FSLX (SUTURE) IMPLANT
SUT ETHILON NAB PS2 4-0 18IN (SUTURE) IMPLANT
TOWEL OR 17X26 4PK STRL BLUE (TOWEL DISPOSABLE) IMPLANT

## 2020-07-18 NOTE — Anesthesia Procedure Notes (Signed)
Procedure Name: Intubation Performed by: Lendon Colonel, CRNA Pre-anesthesia Checklist: Patient identified, Patient being monitored, Timeout performed, Emergency Drugs available and Suction available Patient Re-evaluated:Patient Re-evaluated prior to induction Oxygen Delivery Method: Circle system utilized Preoxygenation: Pre-oxygenation with 100% oxygen Induction Type: IV induction and Rapid sequence Laryngoscope Size: 3 and McGraph Grade View: Grade I Tube type: Oral Tube size: 7.5 mm Number of attempts: 1 Airway Equipment and Method: Stylet Placement Confirmation: ETT inserted through vocal cords under direct vision,  positive ETCO2 and breath sounds checked- equal and bilateral Secured at: 22 cm Tube secured with: Tape Dental Injury: Teeth and Oropharynx as per pre-operative assessment

## 2020-07-18 NOTE — ED Triage Notes (Signed)
Pt sent from [facility] urgent care for laceration to left knee area.  Bleeding controlled.  Pt cut self with chainsaw today.    Pt alert

## 2020-07-18 NOTE — ED Provider Notes (Signed)
[facility]-EMERGENCY DEPARTMENT  ____________________________________________  Time seen: Approximately 4:19 PM  I have reviewed the triage vital signs and the nursing notes.   HISTORY  Chief Complaint Laceration   Historian Patient    HPI [name] is a 75 y.o. male with a history of hypertension and type 2 diabetes, presents to the emergency department after he had a chainsaw accident involving his left knee.  Patient denies falling during injury.  He states that he has been able to ambulate since injury occurred with some difficulty.  He is unsure of his last tetanus shot.  No numbness or tingling of the left lower extremity.  He has been able to raise his straight leg since injury occurred.   Past Medical History:  Diagnosis Date  . Coronary artery disease   . Hyperlipidemia   . Hypertension   . Type II diabetes mellitus ([place])      Immunizations up to date:  Yes.     Past Medical History:  Diagnosis Date  . Coronary artery disease   . Hyperlipidemia   . Hypertension   . Type II diabetes mellitus [facility])     Patient Active Problem List   Diagnosis Date Noted  . Laceration of left knee 07/18/2020  . Knee laceration, left, initial encounter 07/18/2020  . Hyperlipidemia associated with type 2 diabetes mellitus ([place]) 05/13/2019  . Erectile dysfunction 05/13/2019  . Pyuria 01/30/2019  . Stage 3 chronic kidney disease ([place]) 01/30/2019  . Beta thalassemia minor 03/20/2016  . Anemia 03/06/2016  . 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 Thrombocytopenia ([place]) 04/20/2015  . Coronary artery disease 04/18/2015  . Unstable angina ([place]) 04/17/2015  . Elevated troponin 04/17/2015  . DM type 2 causing CKD stage 3 ([place]) 04/17/2015  . Essential hypertension 04/17/2015    Past Surgical History:  Procedure Laterality Date  . CARDIAC CATHETERIZATION N/A 04/18/2015   Procedure: Left  Heart Cath and Coronary Angiography;  Surgeon: Yolonda Kida, MD;  Location: [place] [facility];  Service: Cardiovascular;  Laterality: N/A;  . COLONOSCOPY  2014   cleared for 10 yrs  . CORONARY ARTERY BYPASS GRAFT N/A 04/21/2015   Procedure: CORONARY ARTERY BYPASS GRAFTING (CABG) X 4 UTILIZING THE LEFT INTERNAL MAMMARY ARTERY TO LAD, ENDOSCOPICALLY HARVESTED RIGHT SAPHENEOUS VEINS TO PD,DIAGONAL AND CIRCUMFLEX.;  Surgeon: Grace Isaac, MD;  Location: [place];  Service: Open Heart Surgery;  Laterality: N/A;  . DUPUYTREN CONTRACTURE RELEASE Left ~ 2000  . EYE SURGERY Bilateral    cataract removed  . KNEE ARTHROSCOPY Right ~ 1995  . TEE WITHOUT CARDIOVERSION N/A 04/21/2015   Procedure: TRANSESOPHAGEAL ECHOCARDIOGRAM (TEE);  Surgeon: Grace Isaac, MD;  Location: [place];  Service: Open Heart Surgery;  Laterality: N/A;    Prior to Admission medications   Medication Sig Start Date End Date Taking? Authorizing Provider  aspirin 81 MG tablet Take 1 tablet (81 mg total) by mouth daily. 11/05/17   Juline Patch, MD  atorvastatin (LIPITOR) 40 MG tablet Take 1 tablet (40 mg total) by mouth daily. 05/09/20   Juline Patch, MD  FREESTYLE LITE test strip USE TO TEST EVERY DAY 03/09/20   Juline Patch, MD  glipiZIDE (GLUCOTROL) 10 MG tablet Take 1 tablet (10 mg total) by mouth 2 (two) times daily before a meal. 05/09/20   Juline Patch, MD  losartan (COZAAR) 50 MG tablet Take 1  tablet (50 mg total) by mouth daily. 05/09/20   Juline Patch, MD  metFORMIN (GLUCOPHAGE) 500 MG tablet Take 1 tablet (500 mg total) by mouth 2 (two) times daily with a meal. 05/09/20   Juline Patch, MD  sildenafil (VIAGRA) 50 MG tablet TAKE ONE TABLET DAILY AS NEEDED. 07/11/20   Juline Patch, MD    Allergies Patient has no known allergies.  Family History  Problem Relation Age of Onset  . Diabetes Mother   . Heart attack Other   . Hypertension Other     Social History Social History   Tobacco Use  .  Smoking status: Never Smoker  . Smokeless tobacco: Never Used  Vaping Use  . Vaping Use: Never used  Substance Use Topics  . Alcohol use: Not Currently    Alcohol/week: 0.0 standard drinks  . Drug use: No     Review of Systems  Constitutional: No fever/chills Eyes:  No discharge ENT: No upper respiratory complaints. Respiratory: no cough. No SOB/ use of accessory muscles to breath Gastrointestinal:   No nausea, no vomiting.  No diarrhea.  No constipation. Musculoskeletal: Patient has left knee pain.  Skin: Negative for rash, abrasions, lacerations, ecchymosis.  ____________________________________________   PHYSICAL EXAM:  VITAL SIGNS: ED Triage Vitals  Enc Vitals Group     BP 07/18/20 1551 (!) 147/86     Pulse Rate 07/18/20 1551 73     Resp 07/18/20 1551 20     Temp 07/18/20 1551 97.9 F (36.6 C)     Temp Source 07/18/20 1551 Oral     SpO2 07/18/20 1551 97 %     Weight 07/18/20 1552 190 lb (86.2 kg)     Height 07/18/20 1552 5\' 10"  (1.778 m)     Head Circumference --      Peak Flow --      Pain Score 07/18/20 1551 3     Pain Loc --      Pain Edu? --      Excl. in [place]? --      Constitutional: Alert and oriented. Well appearing and in no acute distress. Eyes: Conjunctivae are normal. PERRL. EOMI. Head: Atraumatic. ENT:      Nose: No congestion/rhinnorhea.      Mouth/Throat: Mucous membranes are moist.  Neck: No stridor.  No cervical spine tenderness to palpation. Cardiovascular: Normal rate, regular rhythm. Normal S1 and S2.  Good peripheral circulation. Respiratory: Normal respiratory effort without tachypnea or retractions. Lungs CTAB. Good air entry to the bases with no decreased or absent breath sounds Gastrointestinal: Bowel sounds x 4 quadrants. Soft and nontender to palpation. No guarding or rigidity. No distention. Musculoskeletal: Full range of motion to all extremities. No obvious deformities noted.Patient can perform a straight leg raise test.  Palpable  dorsalis pedis pulse, left.  Capillary refill less than 2 seconds on the left.   Neurologic:  Normal for age. No gross focal neurologic deficits are appreciated.  Skin: Patient has large, gaping laceration overlying left knee approximately 8-1/2 cm in length and 5 cm in width with maceration around the skin edges deep to underlying muscle.  Bleeding controlled with pressure dressing. Psychiatric: Mood and affect are normal for age. Speech and behavior are normal.   ____________________________________________   LABS (all labs ordered are listed, but only abnormal results are displayed)  Labs Reviewed  CBC WITH DIFFERENTIAL/PLATELET - Abnormal; Notable for the following components:      Result Value   RBC 5.95 (*)  Hemoglobin 11.6 (*)    HCT 36.9 (*)    MCV 62.0 (*)    MCH 19.5 (*)    RDW 17.3 (*)    Platelets 138 (*)    All other components within normal limits  COMPREHENSIVE METABOLIC PANEL - Abnormal; Notable for the following components:   Glucose, Bld 105 (*)    BUN 29 (*)    Creatinine, Ser 1.61 (*)    Total Bilirubin 1.3 (*)    GFR, Estimated 45 (*)    All other components within normal limits  RESP PANEL BY RT-PCR (FLU A&B, COVID) ARPGX2  CBC  COMPREHENSIVE METABOLIC PANEL  CBG MONITORING, ED  TYPE AND SCREEN   ____________________________________________  EKG   ____________________________________________  RADIOLOGY Unk Pinto, personally viewed and evaluated these images (plain radiographs) as part of my medical decision making, as well as reviewing the written report by the radiologist.    CT Knee Left Wo Contrast  Result Date: 07/18/2020 CLINICAL DATA:  Left leg laceration from chain saw EXAM: CT OF THE LEFT KNEE WITHOUT CONTRAST TECHNIQUE: Multidetector CT imaging of the left knee was performed according to the standard protocol. Multiplanar CT image reconstructions were also generated. COMPARISON:  None. FINDINGS: Bones/Joint/Cartilage No acute  fracture of the left knee. Patella intact. Mild-moderate tricompartmental osteoarthritis of the left knee as manifested by joint space narrowing, subchondral sclerosis, and marginal osteophyte formation. Findings are most pronounced within the medial and patellofemoral compartments. 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 Neck measures approximately 1.1 cm. No fluid collection is seen overlying the bony protuberance. No suspicious bone lesion. No significant knee joint effusion. No air is seen within the joint space to suggest traumatic arthrotomy. A small Baker's cyst is noted. Ligaments Suboptimally assessed by CT. Muscles and Tendons Musculotendinous structures appear grossly intact. Soft tissues Irregularity within the soft tissues at the anterolateral aspect of the knee corresponding to reported laceration. There is a ill-defined hyperdense fluid collection/hematoma within the superficial soft tissues at this location measuring approximately 5.5 x 1.9 x 4.3 cm. Hematoma overlies the lateral patellar retinaculum. No radiopaque foreign body within the soft tissues. IMPRESSION: 1. Anterolateral left knee laceration with superficial hematoma measuring approximately 5.5 x 1.9 x 4.3 cm. No radiopaque foreign body within the soft tissues. 2. No acute osseous abnormality. No evidence of traumatic arthrotomy. 3. Mild-moderate tricompartmental osteoarthritis of the left knee, most pronounced within the medial and patellofemoral compartments. 4. 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 5. Small Baker's cyst. Electronically Signed   By: Davina Poke D.O.   On: 07/18/2020 16:46    ____________________________________________    PROCEDURES  Procedure(s) performed:      Procedures     Medications  atorvastatin (LIPITOR) tablet 40 mg ( Oral MAR Unhold 07/18/20 2206)  losartan (COZAAR) tablet 50 mg ( Oral MAR Unhold 07/18/20 2206)  morphine 2 MG/ML injection 0.5-1 mg (1 mg Intravenous Given 07/18/20 2223)  acetaminophen (TYLENOL) tablet 1,000 mg (1,000 mg Oral Given 07/18/20 2222)  traMADol (ULTRAM) tablet 50 mg (has no administration in time range)  celecoxib (CELEBREX) capsule 100 mg (100 mg Oral Given 07/18/20 2230)  senna-docusate (Senokot-S) tablet 1 tablet (has no administration in time range)  ondansetron (ZOFRAN) tablet 4 mg (has no administration in time range)    Or  ondansetron (  ZOFRAN) injection 4 mg (has no administration in time range)  ceFAZolin (ANCEF) IVPB 2g/100 mL premix (has no administration in time range)  aspirin tablet 325 mg (has no administration in time range)  lidocaine-EPINEPHrine (XYLOCAINE W/EPI) 2 %-1:100000 (with pres) injection 20 mL (20 mLs Infiltration Given by Other 07/18/20 1739)  ceFAZolin (ANCEF) IVPB 1 g/50 mL premix (0 g Intravenous Stopped 07/18/20 2209)     ____________________________________________   INITIAL IMPRESSION / ASSESSMENT AND PLAN / ED COURSE  Pertinent labs & imaging results that were available during my care of the patient were reviewed by me and considered in my medical decision making (see chart for details).      Assessment and Plan: Left knee pain:  Laceration   75 year old male presents to the emergency department with a large left knee laceration crossing the joint.  Vital signs are reassuring at triage.  On physical exam, patient was alert, active and nontoxic-appearing.  He was able to perform a straight leg raise test and had a hematoma developing within the laceration.  I spoke with orthopedist on-call, Dr. Sharlet Salina and shared pictures of patient's wound.  He recommended bedside irrigation.  After irrigation, patient's wound was bleeding profusely.  Lidocaine with epi was  injected into wound and pressure dressing was applied.  Discussed patient case with attending Dr. Jacqualine Code who personally evaluated patient and helped with bedside irrigation.  [place] on-call, Dr. Sharlet Salina was consulted who ultimately agreed to take patient to the OR for pressure irrigation and laceration repair. ____________________________________________  FINAL CLINICAL IMPRESSION(S) / ED DIAGNOSES  Final diagnoses:  Laceration of left knee, initial encounter      NEW MEDICATIONS STARTED DURING THIS VISIT:  ED Discharge Orders    None          This chart was dictated using voice recognition software/Dragon. Despite best efforts to proofread, errors can occur which can change the meaning. Any change was purely unintentional.     Lannie Fields, PA-C 07/19/20 Laurena Spies, MD 07/20/20 [phone]

## 2020-07-18 NOTE — H&P (Signed)
PREOPERATIVE H&P  Chief Complaint: left knee infection  HPI: [name] is a 75 y.o. male who presents for preoperative history and physical with a diagnosis of left knee laceration after chainsaw injury earlier this afternoon. He underwent CT and I&D in the ED. This is significantly impairing activities of daily living.  He has elected for surgical management.   Past Medical History:  Diagnosis Date  . Coronary artery disease   . Hyperlipidemia   . Hypertension   . Type II diabetes mellitus ([place])    Past Surgical History:  Procedure Laterality Date  . CARDIAC CATHETERIZATION N/A 04/18/2015   Procedure: Left Heart Cath and Coronary Angiography;  Surgeon: Yolonda Kida, MD;  Location: [place] [facility];  Service: Cardiovascular;  Laterality: N/A;  . COLONOSCOPY  2014   cleared for 10 yrs  . CORONARY ARTERY BYPASS GRAFT N/A 04/21/2015   Procedure: CORONARY ARTERY BYPASS GRAFTING (CABG) X 4 UTILIZING THE LEFT INTERNAL MAMMARY ARTERY TO LAD, ENDOSCOPICALLY HARVESTED RIGHT SAPHENEOUS VEINS TO PD,DIAGONAL AND CIRCUMFLEX.;  Surgeon: Grace Isaac, MD;  Location: [place];  Service: Open Heart Surgery;  Laterality: N/A;  . DUPUYTREN CONTRACTURE RELEASE Left ~ 2000  . EYE SURGERY Bilateral    cataract removed  . KNEE ARTHROSCOPY Right ~ 1995  . TEE WITHOUT CARDIOVERSION N/A 04/21/2015   Procedure: TRANSESOPHAGEAL ECHOCARDIOGRAM (TEE);  Surgeon: Grace Isaac, MD;  Location: [place];  Service: Open Heart Surgery;  Laterality: N/A;   Social History   Socioeconomic History  . Marital status: Married    Spouse name: Not on file  . Number of children: Not on file  . Years of education: Not on file  . Highest education level: Not on file  Occupational History  . Not on file  Tobacco Use  . Smoking status: Never Smoker  . Smokeless tobacco: Never Used  Vaping Use  . Vaping Use: Never used  Substance and Sexual Activity  . Alcohol use: Not Currently    Alcohol/week: 0.0  standard drinks  . Drug use: No  . Sexual activity: Not Currently  Other Topics Concern  . Not on file  Social History Narrative  . Not on file   Social Determinants of Health   Financial Resource Strain: Low Risk   . Difficulty of Paying Living Expenses: Not hard at all  Food Insecurity: No Food Insecurity  . Worried About Charity fundraiser in the Last Year: Never true  . Ran Out of Food in the Last Year: Never true  Transportation Needs: No Transportation Needs  . Lack of Transportation (Medical): No  . Lack of Transportation (Non-Medical): No  Physical Activity: Inactive  . Days of Exercise per Week: 0 days  . Minutes of Exercise per Session: 0 min  Stress: No Stress Concern Present  . Feeling of Stress : Not at all  Social Connections: Moderately Integrated  . Frequency of Communication with Friends and Family: More than three times a week  . Frequency of Social Gatherings with Friends and Family: More than three times a week  . Attends Religious Services: Never  . Active Member of Clubs or Organizations: Yes  . Attends Archivist Meetings: More than 4 times per year  . Marital Status: Married   Family History  Problem Relation Age of Onset  . Diabetes Mother   . Heart attack Other   . Hypertension Other    No Known Allergies Prior to Admission medications   Medication Sig  Start Date End Date Taking? Authorizing Provider  aspirin 81 MG tablet Take 1 tablet (81 mg total) by mouth daily. 11/05/17   Juline Patch, MD  atorvastatin (LIPITOR) 40 MG tablet Take 1 tablet (40 mg total) by mouth daily. 05/09/20   Juline Patch, MD  FREESTYLE LITE test strip USE TO TEST EVERY DAY 03/09/20   Juline Patch, MD  glipiZIDE (GLUCOTROL) 10 MG tablet Take 1 tablet (10 mg total) by mouth 2 (two) times daily before a meal. 05/09/20   Juline Patch, MD  losartan (COZAAR) 50 MG tablet Take 1 tablet (50 mg total) by mouth daily. 05/09/20   Juline Patch, MD  metFORMIN  (GLUCOPHAGE) 500 MG tablet Take 1 tablet (500 mg total) by mouth 2 (two) times daily with a meal. 05/09/20   Juline Patch, MD  sildenafil (VIAGRA) 50 MG tablet TAKE ONE TABLET DAILY AS NEEDED. 07/11/20   Juline Patch, MD     Positive ROS: All other systems have been reviewed and were otherwise negative with the exception of those mentioned in the HPI and as above.  Physical Exam: General: Alert, no acute distress Cardiovascular: No pedal edema. Heart is regular and without murmur.  Respiratory: No cyanosis, no use of accessory musculature. Lungs are clear. GI: No organomegaly, abdomen is soft and non-tender Skin: No lesions in the area of chief complaint Neurologic: Sensation intact distally Psychiatric: Patient is competent for consent with normal mood and affect Lymphatic: No axillary or cervical lymphadenopathy  MUSCULOSKELETAL: Left knee with pressure dressing, cap refill < 2 sec, able to move ankle and toes appropriately, sensation grossly intact  Assessment: left knee laceration  Plan: Plan for Procedure(s): INCISION AND DRAINAGE LEFT KNEE WOUND  The risks benefits and alternatives were discussed with the patient including but not limited to the risks of nonoperative treatment, versus surgical intervention including infection, bleeding, nerve injury,  blood clots, cardiopulmonary complications, morbidity, mortality, among others, and they were willing to proceed. Plan for left knee wound I&D and closure.  Appreciate hospitalist team support given comorbidities. Will admit post-op for pain control, IV abx. Plan for discharge home tomorrow pending PT clearance.  Renee Harder, MD [phone]   07/18/2020 7:26 PM

## 2020-07-18 NOTE — Discharge Instructions (Addendum)

## 2020-07-18 NOTE — Anesthesia Preprocedure Evaluation (Signed)
Anesthesia Evaluation  Patient identified by MRN, date of birth, ID band Patient awake    Reviewed: Allergy & Precautions, NPO status , Patient's Chart, lab work & pertinent test results  History of Anesthesia Complications Negative for: history of anesthetic complications  Airway Mallampati: II  TM Distance: >3 FB Neck ROM: Full    Dental  (+) Teeth Intact, Dental Advisory Given   Pulmonary neg pulmonary ROS,    Pulmonary exam normal breath sounds clear to auscultation       Cardiovascular hypertension, Pt. on medications and Pt. on home beta blockers + angina with exertion + CAD  (-) CHF Normal cardiovascular exam Rhythm:Regular     Neuro/Psych negative neurological ROS  negative psych ROS   GI/Hepatic negative GI ROS, Neg liver ROS,   Endo/Other  diabetes, Type 2, Oral Hypoglycemic Agents  Renal/GU Renal InsufficiencyRenal disease     Musculoskeletal   Abdominal   Peds  Hematology   Anesthesia Other Findings Past Medical History: No date: Coronary artery disease No date: Hyperlipidemia No date: Hypertension No date: Type II diabetes mellitus ([place])  Reproductive/Obstetrics                             Anesthesia Physical  Anesthesia Plan  ASA: III and emergent  Anesthesia Plan: General   Post-op Pain Management:    Induction: Intravenous  PONV Risk Score and Plan:   Airway Management Planned: Oral ETT  Additional Equipment:   Intra-op Plan:   Post-operative Plan:   Informed Consent: I have reviewed the patients History and Physical, chart, labs and discussed the procedure including the risks, benefits and alternatives for the proposed anesthesia with the patient or authorized representative who has indicated his/her understanding and acceptance.     Dental advisory given  Plan Discussed with: CRNA, Anesthesiologist and Surgeon  Anesthesia Plan Comments:          Anesthesia Quick Evaluation

## 2020-07-18 NOTE — OR Nursing (Signed)
Upon induction of anesthesia the IV placed in right upper extremity was identified to be infiltrating under site. IV placed in ED prior to arrival to operating room. IV was stopped and new IV placed in left upper extremity.

## 2020-07-18 NOTE — ED Notes (Signed)
See triage note  Presents from [facility] with laceration to left lower leg and knee  States he cut his leg with chain saw  Laceration is approx 4.5 inches long

## 2020-07-18 NOTE — ED Provider Notes (Signed)
CRITICAL CARE Performed by: Delman Kitten   Total critical care time: 25 minutes  Critical care time was exclusive of separately billable procedures and treating other patients.  Critical care was necessary to treat or prevent imminent or life-threatening deterioration.  Critical care was time spent personally by me on the following activities: development of treatment plan with patient and/or surrogate as well as nursing, discussions with consultants, evaluation of patient's response to treatment, examination of patient, obtaining history from patient or surrogate, ordering and performing treatments and interventions, ordering and review of laboratory studies, ordering and review of radiographic studies, pulse oximetry and re-evaluation of patient's condition.   No assisted physician assistant I bandage and applied Surgicel absorbable dressing to a region of acute arterial area of bleeding anterior to patella.  Suspect this was likely loosened after hematoma was removed and irrigation previous.  Patient estimated blood loss probably somewhere in the age of about 100 mL.  He is alert well oriented.  I again spoke about the case with Dr. Sharlet Salina, he advises that request hospitalist evaluate for medical clearance to go to the operating room for further wound care.Delman Kitten, MD 07/18/20 2011

## 2020-07-18 NOTE — Op Note (Signed)
07/18/2020  9:10 PM  PATIENT:  [name]  75 y.o. male  PRE-OPERATIVE DIAGNOSIS:  left knee laceration from chainsaw accident  POST-OPERATIVE DIAGNOSIS:  Same  PROCEDURE:  IRRIGATION AND DEBRIDEMENT, SKIN CLOSURE, WOUND VAC APPLICATION, LEFT KNEE  SURGEON:  Kerry Odonohue D Tennelle Taflinger, MD  ANESTHESIA:   General  EBL:  10cc  TOURNIQUET TIME:  none  OPERATIVE FINDINGS:  Macerated soft tissue lateral to left knee joint. No gross contamination. Wound did not extend into the knee joint. Skin was approximated, however due to maceration, there is a 2x2cm area at the distal aspect of the wound, just lateral to the knee joint without overlying skin.  OPERATIVE PROCEDURE:    After informed consent was reviewed and obtained, the patient was taken to the OR. The left leg was prepped and draped in sterile fashion. Timeout was taken and antibiotics were administered.  The wound was thoroughly irrigated with 6L of NS. Devitalized soft tissue and macerated epidermis was debrided. Hemostasis was obtained. The wound measured approximately 10cm in length. 2-0 nylon was used to approximate the wound. Due to maceration of the skin, there was not enough skin coverage for the entirety of the wound (2x2cm area at the distal aspect of the wound).  A Prevena wound vac was applied, which held good suction.  Patient was awoken from anesthesia and transferred to the PACU in stable condition.  Post-op Plan Patient will be admitted overnight for pain control. Plan will be for discharge home tomorrow pending PT clearance. He will be WBAT and will be discharged with oral antibiotics. He will follow-up in 5-7 days with wound care for wound vac removal and further treatment of the wound.   Dyane Dustman, MD

## 2020-07-18 NOTE — ED Provider Notes (Signed)
Personally saw and evaluated the patient in conjunction with our physician assistant.    Delman Kitten, MD 07/18/20 [phone]

## 2020-07-18 NOTE — ED Triage Notes (Signed)
Patient presents to Urgent Care with complaints of left leg laceration from a chainsaw about 25 mins ago.Marland Kitchen He reports tetanus vaccine is up to date. He states laceration is about 4-5inches and about an 1in deep. Denies any pain at this time.

## 2020-07-18 NOTE — Consult Note (Signed)
[facility] Medical Consultation  IZIC STFORT WCH:[id number] DOB: [birth year] DOA: 07/18/2020 PCP: Juline Patch, MD   Requesting physician: Dr Jacqualine Code Date of consultation: 07/18/20 Reason for consultation: Preoperative evaluation, management of medical problems  Impression/Recommendations Principal Problem:   Laceration of left knee Active Problems:   DM type 2 causing CKD stage 3 ([place])   Essential hypertension   Coronary artery disease   CAD of autologous bypass graft    1. Laceration of left knee Following a chainsaw accident involving the left leg. Orthopedic surgery has been consulted and further management per orthopedic surgery.   2.  Hypertension Blood pressure is stable. Continue losartan   3.  Diabetes mellitus with complications of chronic kidney disease stage III Hold oral hypoglycemic agents Place patient on a consistent carbohydrate diet Glycemic control with sliding scale insulin Renal function is stable   4.  Coronary artery disease status post CABG Stable Hold aspirin for planned procedure Continue atorvastatin   5.  Chronic microcytic anemia Patient has a history of beta thalassemia minor joints Obtain iron levels  I will followup again tomorrow. Please contact me if I can be of assistance in the meanwhile. Thank you for this consultation.  Chief Complaint: Laceration of left leg  HPI:  Patient is a 75 year old Caucasian male with a past medical history significant for hypertension, dyslipidemia, diabetes mellitus and coronary artery disease who presents to the ER for evaluation following a chainsaw accident involving his left knee.  Patient states he was cutting of a deadline of an apple tree when the chainsaw fell and landed on his left leg resulting in the laceration.  He did have significant blood loss which he describes as possibly half a pint of blood.  He denies feeling dizzy or lightheaded.  He denies having any chest pain, no  shortness of breath, no nausea, no vomiting, no abdominal pain, no headache, no fever, no chills, no cough, no blurred vision, no urinary frequency, no nocturia, no dysuria, no diarrhea or constipation. Medical consult was requested for preoperative evaluation.  Review of Systems:  As per HPI otherwise all other systems reviewed and negative.   Past Medical History:  Diagnosis Date  . Coronary artery disease   . Hyperlipidemia   . Hypertension   . Type II diabetes mellitus ([place])    Past Surgical History:  Procedure Laterality Date  . CARDIAC CATHETERIZATION N/A 04/18/2015   Procedure: Left Heart Cath and Coronary Angiography;  Surgeon: Yolonda Kida, MD;  Location: [place] [facility];  Service: Cardiovascular;  Laterality: N/A;  . COLONOSCOPY  2014   cleared for 10 yrs  . CORONARY ARTERY BYPASS GRAFT N/A 04/21/2015   Procedure: CORONARY ARTERY BYPASS GRAFTING (CABG) X 4 UTILIZING THE LEFT INTERNAL MAMMARY ARTERY TO LAD, ENDOSCOPICALLY HARVESTED RIGHT SAPHENEOUS VEINS TO PD,DIAGONAL AND CIRCUMFLEX.;  Surgeon: Grace Isaac, MD;  Location: [place];  Service: Open Heart Surgery;  Laterality: N/A;  . DUPUYTREN CONTRACTURE RELEASE Left ~ 2000  . EYE SURGERY Bilateral    cataract removed  . KNEE ARTHROSCOPY Right ~ 1995  . TEE WITHOUT CARDIOVERSION N/A 04/21/2015   Procedure: TRANSESOPHAGEAL ECHOCARDIOGRAM (TEE);  Surgeon: Grace Isaac, MD;  Location: [place];  Service: Open Heart Surgery;  Laterality: N/A;   Social History:  reports that he has never smoked. He has never used smokeless tobacco. He reports previous alcohol use. He reports that he does not use drugs.  No Known Allergies Family History  Problem Relation Age  of Onset  . Diabetes Mother   . Heart attack Other   . Hypertension Other     Prior to Admission medications   Medication Sig Start Date End Date Taking? Authorizing Provider  aspirin 81 MG tablet Take 1 tablet (81 mg total) by mouth daily. 11/05/17   Juline Patch, MD  atorvastatin (LIPITOR) 40 MG tablet Take 1 tablet (40 mg total) by mouth daily. 05/09/20   Juline Patch, MD  FREESTYLE LITE test strip USE TO TEST EVERY DAY 03/09/20   Juline Patch, MD  glipiZIDE (GLUCOTROL) 10 MG tablet Take 1 tablet (10 mg total) by mouth 2 (two) times daily before a meal. 05/09/20   Juline Patch, MD  losartan (COZAAR) 50 MG tablet Take 1 tablet (50 mg total) by mouth daily. 05/09/20   Juline Patch, MD  metFORMIN (GLUCOPHAGE) 500 MG tablet Take 1 tablet (500 mg total) by mouth 2 (two) times daily with a meal. 05/09/20   Juline Patch, MD  sildenafil (VIAGRA) 50 MG tablet TAKE ONE TABLET DAILY AS NEEDED. 07/11/20   Juline Patch, MD   Physical Exam: Blood pressure (!) 147/86, pulse 73, temperature 97.9 F (36.6 C), temperature source Oral, resp. rate 20, height 5\' 10"  (1.778 m), weight 86.2 kg, SpO2 97 %. Vitals:   07/18/20 1551  BP: (!) 147/86  Pulse: 73  Resp: 20  Temp: 97.9 F (36.6 C)  SpO2: 97%     General: Appears comfortable and in no obvious distress  Eyes: No conjunctival pallor  ENT: Normal  Neck: Supple, no JVD  Cardiovascular: RRR S1, S2  Respiratory: Clear to auscultation bilaterally  Abdomen: Bowel sounds are present, soft nontender  Skin: Warm and dry  Musculoskeletal: Laceration to left knee.  Pressure dressing in place  Psychiatric: Normal mood and affect  Neurologic: Able to move all extremities  Labs on Admission:  Basic Metabolic Panel: Recent Labs  Lab 07/18/20 1658  NA 135  K 4.3  CL 105  CO2 22  GLUCOSE 105*  BUN 29*  CREATININE 1.61*  CALCIUM 9.1   Liver Function Tests: Recent Labs  Lab 07/18/20 1658  AST 21  ALT 17  ALKPHOS 60  BILITOT 1.3*  PROT 6.8  ALBUMIN 4.1   No results for input(s): LIPASE, AMYLASE in the last 168 hours. No results for input(s): AMMONIA in the last 168 hours. CBC: Recent Labs  Lab 07/18/20 1658  WBC 5.7  NEUTROABS 3.7  HGB 11.6*  HCT 36.9*  MCV 62.0*   PLT 138*   Cardiac Enzymes: No results for input(s): CKTOTAL, CKMB, CKMBINDEX, TROPONINI in the last 168 hours. BNP: Invalid input(s): POCBNP CBG: No results for input(s): GLUCAP in the last 168 hours.  Radiological Exams on Admission: CT Knee Left Wo Contrast  Result Date: 07/18/2020 CLINICAL DATA:  Left leg laceration from chain saw EXAM: CT OF THE LEFT KNEE WITHOUT CONTRAST TECHNIQUE: Multidetector CT imaging of the left knee was performed according to the standard protocol. Multiplanar CT image reconstructions were also generated. COMPARISON:  None. FINDINGS: Bones/Joint/Cartilage No acute fracture of the left knee. Patella intact. Mild-moderate tricompartmental osteoarthritis of the left knee as manifested by joint space narrowing, subchondral sclerosis, and marginal osteophyte formation. Findings are most pronounced within the medial and patellofemoral compartments. 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 Neck measures approximately 1.1 cm. No fluid collection is seen overlying  the bony protuberance. No suspicious bone lesion. No significant knee joint effusion. No air is seen within the joint space to suggest traumatic arthrotomy. A small Baker's cyst is noted. Ligaments Suboptimally assessed by CT. Muscles and Tendons Musculotendinous structures appear grossly intact. Soft tissues Irregularity within the soft tissues at the anterolateral aspect of the knee corresponding to reported laceration. There is a ill-defined hyperdense fluid collection/hematoma within the superficial soft tissues at this location measuring approximately 5.5 x 1.9 x 4.3 cm. Hematoma overlies the lateral patellar retinaculum. No radiopaque foreign body within the soft tissues. IMPRESSION: 1. Anterolateral left knee laceration with superficial hematoma measuring approximately 5.5 x 1.9 x 4.3 cm. No radiopaque  foreign body within the soft tissues. 2. No acute osseous abnormality. No evidence of traumatic arthrotomy. 3. Mild-moderate tricompartmental osteoarthritis of the left knee, most pronounced within the medial and patellofemoral compartments. 4. 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 5. Small Baker's cyst. Electronically Signed   By: Davina Poke D.O.   On: 07/18/2020 16:46    EKG: Independently reviewed.  Unavailable at the time of this documentation  Time spent: 29  Dajai Wahlert [facility] Pager [phone]  If 7PM-7AM, please contact night-coverage [email or web address] Password [facility] 07/18/2020, 7:03 PM

## 2020-07-18 NOTE — ED Notes (Addendum)
Patient is being discharged from the Urgent Care and sent to the Emergency Department via POV. Per Carlyon Prows, [place] patient is in need of higher level of care due to large laceration. Patient is aware and verbalizes understanding of plan of care.  Vitals:   07/18/20 1517  BP: (!) 154/80  Pulse: 74  Resp: 16  SpO2: 98%

## 2020-07-18 NOTE — ED Notes (Signed)
Report called to [place]   Spoke with Patent examiner

## 2020-07-18 NOTE — Transfer of Care (Signed)
Immediate Anesthesia Transfer of Care Note  Patient: EHREN BERISHA  Procedure(s) Performed: INCISION AND DRAINAGE LEFT KNEE (Left )  Patient Location: PACU  Anesthesia Type:General  Level of Consciousness: awake and patient cooperative  Airway & Oxygen Therapy: Patient Spontanous Breathing and Patient connected to face mask oxygen  Post-op Assessment: Report given to RN and Post -op Vital signs reviewed and stable  Post vital signs: Reviewed and stable  Last Vitals:  Vitals Value Taken Time  BP 133/68 07/18/20 2103  Temp    Pulse 84 07/18/20 2110  Resp 16 07/18/20 2110  SpO2 100 % 07/18/20 2110  Vitals shown include unvalidated device data.  Last Pain:  Vitals:   07/18/20 1551  TempSrc: Oral  PainSc: 3          Complications: No complications documented.

## 2020-07-18 NOTE — ED Provider Notes (Signed)
[facility]    CSN: [id number] Arrival date & time: 07/18/20  1459      History   Chief Complaint Chief Complaint  Patient presents with  . Extremity Laceration    HPI [name] is a 75 y.o. male presenting for laceration of the right knee that occurred about 25 minutes prior to arrival to urgent care.  He says that he was outside and accidentally cut the knee with a chainsaw.  His friend/neighbor bandaged him up and brought him to the urgent care.  Patient says it is not hurting him that badly he denies any numbness.  He does have history of type 2 diabetes, hypertension, hyperlipidemia, CAD, stage III kidney disease, beta thalassemia minor, and thrombocytopenia.  Patient has taken a baby aspirin for pain.  He denies any anticoagulant use.  No other complaints or concerns.  HPI  Past Medical History:  Diagnosis Date  . Coronary artery disease   . Hyperlipidemia   . Hypertension   . Type II diabetes mellitus [facility])     Patient Active Problem List   Diagnosis Date Noted  . Hyperlipidemia associated with type 2 diabetes mellitus ([place]) 05/13/2019  . Erectile dysfunction 05/13/2019  . Pyuria 01/30/2019  . Stage 3 chronic kidney disease ([place]) 01/30/2019  . Beta thalassemia minor 03/20/2016  . Anemia 03/06/2016  . 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 Thrombocytopenia ([place]) 04/20/2015  . Coronary artery disease 04/18/2015  . Unstable angina ([place]) 04/17/2015  . Elevated troponin 04/17/2015  . Type 2 diabetes mellitus ([place]) 04/17/2015  . Essential hypertension 04/17/2015    Past Surgical History:  Procedure Laterality Date  . CARDIAC CATHETERIZATION N/A 04/18/2015   Procedure: Left Heart Cath and Coronary Angiography;  Surgeon: Yolonda Kida, MD;  Location: [place] [facility];  Service: Cardiovascular;  Laterality: N/A;  . COLONOSCOPY  2014   cleared for 10 yrs   . CORONARY ARTERY BYPASS GRAFT N/A 04/21/2015   Procedure: CORONARY ARTERY BYPASS GRAFTING (CABG) X 4 UTILIZING THE LEFT INTERNAL MAMMARY ARTERY TO LAD, ENDOSCOPICALLY HARVESTED RIGHT SAPHENEOUS VEINS TO PD,DIAGONAL AND CIRCUMFLEX.;  Surgeon: Grace Isaac, MD;  Location: [place];  Service: Open Heart Surgery;  Laterality: N/A;  . DUPUYTREN CONTRACTURE RELEASE Left ~ 2000  . EYE SURGERY Bilateral    cataract removed  . KNEE ARTHROSCOPY Right ~ 1995  . TEE WITHOUT CARDIOVERSION N/A 04/21/2015   Procedure: TRANSESOPHAGEAL ECHOCARDIOGRAM (TEE);  Surgeon: Grace Isaac, MD;  Location: [place];  Service: Open Heart Surgery;  Laterality: N/A;       Home Medications    Prior to Admission medications   Medication Sig Start Date End Date Taking? Authorizing Provider  aspirin 81 MG tablet Take 1 tablet (81 mg total) by mouth daily. 11/05/17   Juline Patch, MD  atorvastatin (LIPITOR) 40 MG tablet Take 1 tablet (40 mg total) by mouth daily. 05/09/20   Juline Patch, MD  FREESTYLE LITE test strip USE TO TEST EVERY DAY 03/09/20   Juline Patch, MD  glipiZIDE (GLUCOTROL) 10 MG tablet Take 1 tablet (10 mg total) by mouth 2 (two) times daily before a meal. 05/09/20   Juline Patch, MD  losartan (COZAAR) 50 MG tablet Take 1 tablet (50 mg total) by mouth daily. 05/09/20   Juline Patch, MD  metFORMIN (GLUCOPHAGE) 500 MG tablet Take 1 tablet (500 mg  total) by mouth 2 (two) times daily with a meal. 05/09/20   Juline Patch, MD  sildenafil (VIAGRA) 50 MG tablet TAKE ONE TABLET DAILY AS NEEDED. 07/11/20   Juline Patch, MD    Family History Family History  Problem Relation Age of Onset  . Diabetes Mother   . Heart attack Other   . Hypertension Other     Social History Social History   Tobacco Use  . Smoking status: Never Smoker  . Smokeless tobacco: Never Used  Vaping Use  . Vaping Use: Never used  Substance Use Topics  . Alcohol use: Not Currently    Alcohol/week: 0.0 standard drinks  .  Drug use: No     Allergies   Patient has no known allergies.   Review of Systems Review of Systems  Musculoskeletal: Negative for arthralgias, gait problem and joint swelling.  Skin: Positive for wound. Negative for color change.  Neurological: Negative for weakness and numbness.  Hematological: Does not bruise/bleed easily.     Physical Exam Triage Vital Signs ED Triage Vitals [07/18/20 1517]  Enc Vitals Group     BP (!) 154/80     Pulse Rate 74     Resp 16     Temp      Temp Source Oral     SpO2 98 %     Weight      Height      Head Circumference      Peak Flow      Pain Score 0     Pain Loc      Pain Edu?      Excl. in [place]?    No data found.  Updated Vital Signs BP (!) 154/80 (BP Location: Right Arm)   Pulse 74   Resp 16   SpO2 98%       Physical Exam Vitals and nursing note reviewed.  Constitutional:      General: He is not in acute distress.    Appearance: Normal appearance. He is well-developed. He is not ill-appearing or toxic-appearing.  HENT:     Head: Normocephalic and atraumatic.  Eyes:     General: No scleral icterus.    Conjunctiva/sclera: Conjunctivae normal.  Cardiovascular:     Rate and Rhythm: Normal rate.     Pulses: Normal pulses.  Pulmonary:     Effort: Pulmonary effort is normal. No respiratory distress.  Musculoskeletal:     Cervical back: Neck supple.  Skin:    General: Skin is warm and dry.     Findings: Lesion present.     Comments: There is an approximately 4 in x 2.5 in large laceration of right anterior knee. Heavy bleeding and multiple area of coagulation. Appears to have possible exposure of muscle of thigh.   Neurological:     General: No focal deficit present.     Mental Status: He is alert. Mental status is at baseline.     Motor: No weakness.     Gait: Gait abnormal (mild limp).  Psychiatric:        Mood and Affect: Mood normal.        Behavior: Behavior normal.        Thought Content: Thought content normal.       UC Treatments / Results  Labs (all labs ordered are listed, but only abnormal results are displayed) Labs Reviewed - No data to display  EKG   Radiology  Procedures Procedures (including critical care time)  Medications Ordered  in UC Medications - No data to display  Initial Impression / Assessment and Plan / UC Course  I have reviewed the triage vital signs and the nursing notes.  Pertinent labs & imaging results that were available during my care of the patient were reviewed by me and considered in my medical decision making (see chart for details).   75 year old male with large gaping open wound of right knee due to chainsaw injury presenting today.  After quick examination, concern for complications from a deep wound.  Possible muscle exposure noted.  Large amount of bleeding.  The bandage that he had was soaked through with blood and pressure had to be applied and multiple gauze pads and Coban used to wrap with any backup.  Since he is a diabetic and has multiple other comorbidities, advised patient that he needs to go to the emergency department where at this will likely be a multilayer closure and could even require consult with surgery.  Patient agreeable.  He states that his neighbor/friend will take him to [facility] at this time.  Patient leaving in stable condition.  He is hemodynamically stable.   Final Clinical Impressions(s) / UC Diagnoses   Final diagnoses:  Laceration of knee, complicated, right, initial encounter  Contact with chainsaw as cause of accidental injury  Type 2 diabetes mellitus without complication, unspecified whether long term insulin use ([place])     Discharge Instructions     You have been advised to follow up immediately in the emergency department for concerning signs.symptoms. If you declined EMS transport, please have a family member take you directly to the ED at this time. Do not delay. Based on concerns about condition, if you do not  follow up in th e ED, you may risk poor outcomes including worsening of condition, delayed treatment and potentially life threatening issues. If you have declined to go to the ED at this time, you should call your PCP immediately to set up a follow up appointment.  Go to ED for red flag symptoms, including; fevers you cannot reduce with Tylenol/Motrin, severe headaches, vision changes, numbness/weakness in part of the body, lethargy, confusion, intractable vomiting, severe dehydration, chest pain, breathing difficulty, severe persistent abdominal or pelvic pain, signs of severe infection (increased redness, swelling of an area), feeling faint or passing out, dizziness, etc. You should especially go to the ED for sudden acute worsening of condition if you do not elect to go at this time.     ED Prescriptions    None     PDMP not reviewed this encounter.   Danton Clap, PA-C 07/18/20 1707

## 2020-07-19 ENCOUNTER — Encounter: Payer: Self-pay | Admitting: Orthopaedic Surgery

## 2020-07-19 DIAGNOSIS — S062X0D Diffuse traumatic brain injury without loss of consciousness, subsequent encounter: Secondary | ICD-10-CM

## 2020-07-19 DIAGNOSIS — N1832 Chronic kidney disease, stage 3b: Secondary | ICD-10-CM | POA: Diagnosis not present

## 2020-07-19 DIAGNOSIS — I2510 Atherosclerotic heart disease of native coronary artery without angina pectoris: Secondary | ICD-10-CM

## 2020-07-19 DIAGNOSIS — E785 Hyperlipidemia, unspecified: Secondary | ICD-10-CM

## 2020-07-19 DIAGNOSIS — I25810 Atherosclerosis of coronary artery bypass graft(s) without angina pectoris: Secondary | ICD-10-CM

## 2020-07-19 DIAGNOSIS — I10 Essential (primary) hypertension: Secondary | ICD-10-CM | POA: Diagnosis not present

## 2020-07-19 DIAGNOSIS — E1122 Type 2 diabetes mellitus with diabetic chronic kidney disease: Secondary | ICD-10-CM

## 2020-07-19 DIAGNOSIS — S062X9A Diffuse traumatic brain injury with loss of consciousness of unspecified duration, initial encounter: Secondary | ICD-10-CM

## 2020-07-19 DIAGNOSIS — S81012A Laceration without foreign body, left knee, initial encounter: Secondary | ICD-10-CM | POA: Diagnosis not present

## 2020-07-19 DIAGNOSIS — S062XAA Diffuse traumatic brain injury with loss of consciousness status unknown, initial encounter: Secondary | ICD-10-CM

## 2020-07-19 LAB — COMPREHENSIVE METABOLIC PANEL
ALT: 16 U/L (ref 0–44)
AST: 19 U/L (ref 15–41)
Albumin: 3.4 g/dL — ABNORMAL LOW (ref 3.5–5.0)
Alkaline Phosphatase: 49 U/L (ref 38–126)
Anion gap: 8 (ref 5–15)
BUN: 26 mg/dL — ABNORMAL HIGH (ref 8–23)
CO2: 25 mmol/L (ref 22–32)
Calcium: 8.9 mg/dL (ref 8.9–10.3)
Chloride: 103 mmol/L (ref 98–111)
Creatinine, Ser: 1.78 mg/dL — ABNORMAL HIGH (ref 0.61–1.24)
GFR, Estimated: 40 mL/min — ABNORMAL LOW (ref >60)
Glucose, Bld: 215 mg/dL — ABNORMAL HIGH (ref 70–99)
Potassium: 4.9 mmol/L (ref 3.5–5.1)
Sodium: 136 mmol/L (ref 135–145)
Total Bilirubin: 1 mg/dL (ref 0.3–1.2)
Total Protein: 5.7 g/dL — ABNORMAL LOW (ref 6.5–8.1)

## 2020-07-19 LAB — GLUCOSE, CAPILLARY: Glucose-Capillary: 96 mg/dL (ref 70–99)

## 2020-07-19 LAB — CBC
HCT: 29.9 % — ABNORMAL LOW (ref 39.0–52.0)
Hemoglobin: 9.4 g/dL — ABNORMAL LOW (ref 13.0–17.0)
MCH: 19.7 pg — ABNORMAL LOW (ref 26.0–34.0)
MCHC: 31.4 g/dL (ref 30.0–36.0)
MCV: 62.7 fL — ABNORMAL LOW (ref 80.0–100.0)
Platelets: 104 10*3/uL — ABNORMAL LOW (ref 150–400)
RBC: 4.77 MIL/uL (ref 4.22–5.81)
RDW: 16.8 % — ABNORMAL HIGH (ref 11.5–15.5)
WBC: 6.3 10*3/uL (ref 4.0–10.5)
nRBC: 0 % (ref 0.0–0.2)

## 2020-07-19 MED ORDER — CLINDAMYCIN HCL 300 MG PO CAPS: 300.0000 mg | ORAL_CAPSULE | Freq: Three times a day (TID) | ORAL | 0 refills | Status: AC

## 2020-07-19 MED ORDER — ACETAMINOPHEN 500 MG PO TABS: 1000.0000 mg | ORAL_TABLET | Freq: Three times a day (TID) | ORAL | 0 refills | Status: AC

## 2020-07-19 NOTE — Progress Notes (Signed)
Iv dc'd tip intact, avs given and reviewed. Discussed medications, f/u appointment, activity and wound care. All questions answered. Pt waiting for family to pick him up.

## 2020-07-19 NOTE — TOC Progression Note (Addendum)
Transition of Care [facility]) - Progression Note    Patient Details  Name: [name] MRN: [id number] Date of Birth: Feb 16, 1946  [facility] CM/SW Contact  Su Hilt, RN Phone Number: 07/19/2020, 12:58 PM  Clinical Narrative:    [name] with [facility] at [facility] and set up wound care Home health to start on 07/25/20 per [facility] protocol. The patient lives at home with his wife and is independent at home, no DME needed       Expected Discharge Plan and Services           Expected Discharge Date: 07/19/20                                     Social Determinants of Health (SDOH) Interventions    Readmission Risk Interventions No flowsheet data found.

## 2020-07-19 NOTE — Evaluation (Signed)
Physical Therapy Evaluation Patient Details Name: [name] MRN: [id number] DOB: Jan 20, 1946 Today's Date: 07/19/2020   History of Present Illness  Pt admitted for laceration of L knee s/p I&D on 4/12. History includes HTN, DM, and CAD.  Clinical Impression  Pt is a pleasant 75 year old male who was admitted for L knee I&D s/p laceration from chainsaw accident. Pt has been ambulatory with RN staff in room. Able to perform bed mobility/transfers with mod I and ambulation with supervision and no AD. Able to push IV pole, but not used for balance/stability. Stair training performed safely. Pt demonstrates deficits with L knee pain/ROM.  Pt does not require any further PT needs during acute stay. Recommend OP PT when allowed to participate. Pt will be dc in house and does not require follow up. RN aware. Will dc current orders.     Follow Up Recommendations Outpatient PT (when released from ROM restrictions)    Equipment Recommendations  None recommended by PT    Recommendations for Other Services       Precautions / Restrictions Precautions Precautions: Fall Precaution Comments: ROM flexion restriction to 20-30 degrees per MD via secure chat Restrictions Weight Bearing Restrictions: Yes LLE Weight Bearing: Weight bearing as tolerated      Mobility  Bed Mobility Overal bed mobility: Modified Independent             General bed mobility comments: safe technique with use of railing. Once seated, upright posture noted    Transfers Overall transfer level: Modified independent Equipment used: None             General transfer comment: pushed from seated surface. kept L LE straight during transfer  Ambulation/Gait Ambulation/Gait assistance: Supervision Gait Distance (Feet): 200 Feet Assistive device: IV Pole Gait Pattern/deviations: Step-through pattern     General Gait Details: ambulated with safe technique. Circumduction noted during swing phase on L LE.  Tends to keep L knee straight. Good weight acceptance. Upright posture noted  Stairs Stairs: Yes Stairs assistance: Min guard Stair Management: No rails Number of Stairs: 4 General stair comments: able to navigate stairs safely. Educated on safe technique to prevent increased L knee flexion. step to pattern performed  Wheelchair Mobility    Modified Rankin (Stroke Patients Only)       Balance Overall balance assessment: Independent                                           Pertinent Vitals/Pain Pain Assessment: 0-10 Pain Score: 2  Pain Location: L knee' Pain Descriptors / Indicators: Operative site guarding;Aching Pain Intervention(s): Limited activity within patient's tolerance;Repositioned (elevated)    Home Living Family/patient expects to be discharged to:: Private residence Living Arrangements: Spouse/significant other Available Help at Discharge: Family Type of Home: House Home Access: Stairs to enter Entrance Stairs-Rails: None Entrance Stairs-Number of Steps: 2 Home Layout: Two level;Able to live on main level with bedroom/bathroom Home Equipment: None      Prior Function Level of Independence: Independent         Comments: active, reports no previous falls.     Hand Dominance        Extremity/Trunk Assessment   Upper Extremity Assessment Upper Extremity Assessment: Overall WFL for tasks assessed    Lower Extremity Assessment Lower Extremity Assessment: Generalized weakness (L LE grossly 4/5; R LE grossly 5/5)  Communication   Communication: No difficulties  Cognition Arousal/Alertness: Awake/alert Behavior During Therapy: WFL for tasks assessed/performed Overall Cognitive Status: Within Functional Limits for tasks assessed                                        General Comments      Exercises     Assessment/Plan    PT Assessment All further PT needs can be met in the next venue of care  PT  Problem List Decreased range of motion;Decreased mobility;Pain       PT Treatment Interventions      PT Goals (Current goals can be found in the Care Plan section)  Acute Rehab PT Goals Patient Stated Goal: to go home PT Goal Formulation: All assessment and education complete, DC therapy Time For Goal Achievement: 07/19/20 Potential to Achieve Goals: Good    Frequency     Barriers to discharge        Co-evaluation               AM-PAC PT "6 Clicks" Mobility  Outcome Measure Help needed turning from your back to your side while in a flat bed without using bedrails?: None Help needed moving from lying on your back to sitting on the side of a flat bed without using bedrails?: None Help needed moving to and from a bed to a chair (including a wheelchair)?: None Help needed standing up from a chair using your arms (e.g., wheelchair or bedside chair)?: None Help needed to walk in hospital room?: None Help needed climbing 3-5 steps with a railing? : None 6 Click Score: 24    End of Session         PT Visit Diagnosis: Pain Pain - Right/Left: Left Pain - part of body: Knee    Time: 1349-1405 PT Time Calculation (min) (ACUTE ONLY): 16 min   Charges:   PT Evaluation $PT Eval Low Complexity: 1 Low PT Treatments $Gait Training: 8-22 mins        Greggory Stallion, PT, DPT [phone]   Luanna Weesner 07/19/2020, 2:21 PM

## 2020-07-19 NOTE — Progress Notes (Signed)
RN requested 2 previna /wound vac canisters from Special educational needs teacher, [place].

## 2020-07-19 NOTE — Progress Notes (Signed)
Patient ID: [name], male   DOB: 02-Aug-1945, 75 y.o.   MRN: [id number] [facility] PROGRESS NOTE  [name] EHU:[id number] DOB: 25-Nov-1945 DOA: 07/18/2020 PCP: Robert Patch, MD  HPI/Subjective: Patient asked me if he can go home.  He had a chainsaw accident where he was cutting down some branches and the branch hit his arm and knocked the chainsaw into his leg.  He went to the operating room for irrigation and debridement.  Awaiting reevaluation with orthopedics  Objective: Vitals:   07/19/20 0455 07/19/20 0810  BP: (!) 110/51 (!) 108/54  Pulse: 72 63  Resp: 17 16  Temp: (!) 97.4 F (36.3 C) 97.9 F (36.6 C)  SpO2: 99% 96%    Intake/Output Summary (Last 24 hours) at 07/19/2020 1234 Last data filed at 07/19/2020 1143 Gross per 24 hour  Intake 1150 ml  Output 550 ml  Net 600 ml   Filed Weights   07/18/20 1552  Weight: 86.2 kg    ROS: Review of Systems  Respiratory: Negative for shortness of breath.   Cardiovascular: Negative for chest pain.  Gastrointestinal: Negative for abdominal pain, nausea and vomiting.  Musculoskeletal: Positive for joint pain.   Exam: Physical Exam HENT:     Head: Normocephalic.     Mouth/Throat:     Pharynx: No oropharyngeal exudate.  Eyes:     General: Lids are normal.     Conjunctiva/sclera: Conjunctivae normal.  Cardiovascular:     Rate and Rhythm: Normal rate and regular rhythm.     Heart sounds: Normal heart sounds, S1 normal and S2 normal.  Pulmonary:     Breath sounds: Normal breath sounds. No decreased breath sounds, wheezing, rhonchi or rales.  Abdominal:     Palpations: Abdomen is soft.     Tenderness: There is no abdominal tenderness.  Musculoskeletal:     Right lower leg: No swelling.     Left lower leg: No swelling.  Skin:    General: Skin is warm.     Findings: No rash.     Comments: Left knee covered.  Neurological:     Mental Status: He is alert and oriented to person, place, and time.        Data Reviewed: Basic Metabolic Panel: Recent Labs  Lab 07/18/20 1658 07/19/20 0443  NA 135 136  K 4.3 4.9  CL 105 103  CO2 22 25  GLUCOSE 105* 215*  BUN 29* 26*  CREATININE 1.61* 1.78*  CALCIUM 9.1 8.9   Liver Function Tests: Recent Labs  Lab 07/18/20 1658 07/19/20 0443  AST 21 19  ALT 17 16  ALKPHOS 60 49  BILITOT 1.3* 1.0  PROT 6.8 5.7*  ALBUMIN 4.1 3.4*   CBC: Recent Labs  Lab 07/18/20 1658 07/19/20 0443  WBC 5.7 6.3  NEUTROABS 3.7  --   HGB 11.6* 9.4*  HCT 36.9* 29.9*  MCV 62.0* 62.7*  PLT 138* 104*    CBG: Recent Labs  Lab 07/18/20 1910 07/18/20 2105  GLUCAP 96 84    Recent Results (from the past 240 hour(s))  Resp Panel by RT-PCR (Flu A&B, Covid) Nasopharyngeal Swab     Status: None   Collection Time: 07/18/20  4:58 PM   Specimen: Nasopharyngeal Swab; Nasopharyngeal(NP) swabs in vial transport medium  Result Value Ref Range Status   SARS Coronavirus 2 by RT PCR NEGATIVE NEGATIVE Final    Comment: (NOTE) SARS-CoV-2 target nucleic acids are NOT DETECTED.  The SARS-CoV-2 RNA is generally detectable in  upper respiratory specimens during the acute phase of infection. The lowest concentration of SARS-CoV-2 viral copies this assay can detect is 138 copies/mL. A negative result does not preclude SARS-Cov-2 infection and should not be used as the sole basis for treatment or other patient management decisions. A negative result may occur with  improper specimen collection/handling, submission of specimen other than nasopharyngeal swab, presence of viral mutation(s) within the areas targeted by this assay, and inadequate number of viral copies(<138 copies/mL). A negative result must be combined with clinical observations, patient history, and epidemiological information. The expected result is Negative.  Fact Sheet for Patients:  EntrepreneurPulse.com.au  Fact Sheet for Healthcare Providers:   IncredibleEmployment.be  This test is no t yet approved or cleared by the [place] FDA and  has been authorized for detection and/or diagnosis of SARS-CoV-2 by FDA under an Emergency Use Authorization (EUA). This EUA will remain  in effect (meaning this test can be used) for the duration of the COVID-19 declaration under Section 564(b)(1) of the Act, 21 U.S.C.section 360bbb-3(b)(1), unless the authorization is terminated  or revoked sooner.       Influenza A by PCR NEGATIVE NEGATIVE Final   Influenza B by PCR NEGATIVE NEGATIVE Final    Comment: (NOTE) The Xpert Xpress SARS-CoV-2/FLU/RSV plus assay is intended as an aid in the diagnosis of influenza from Nasopharyngeal swab specimens and should not be used as a sole basis for treatment. Nasal washings and aspirates are unacceptable for Xpert Xpress SARS-CoV-2/FLU/RSV testing.  Fact Sheet for Patients: EntrepreneurPulse.com.au  Fact Sheet for Healthcare Providers: IncredibleEmployment.be  This test is not yet approved or cleared by the [place] FDA and has been authorized for detection and/or diagnosis of SARS-CoV-2 by FDA under an Emergency Use Authorization (EUA). This EUA will remain in effect (meaning this test can be used) for the duration of the COVID-19 declaration under Section 564(b)(1) of the Act, 21 U.S.C. section 360bbb-3(b)(1), unless the authorization is terminated or revoked.  Performed at [facility], [address] [place], [place] [address]      Studies: CT Knee Left Wo Contrast  Result Date: 07/18/2020 CLINICAL DATA:  Left leg laceration from chain saw EXAM: CT OF THE LEFT KNEE WITHOUT CONTRAST TECHNIQUE: Multidetector CT imaging of the left knee was performed according to the standard protocol. Multiplanar CT image reconstructions were also generated. COMPARISON:  None. FINDINGS: Bones/Joint/Cartilage No acute fracture of the left  knee. Patella intact. Mild-moderate tricompartmental osteoarthritis of the left knee as manifested by joint space narrowing, subchondral sclerosis, and marginal osteophyte formation. Findings are most pronounced within the medial and patellofemoral compartments. 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 Neck measures approximately 1.1 cm. No fluid collection is seen overlying the bony protuberance. No suspicious bone lesion. No significant knee joint effusion. No air is seen within the joint space to suggest traumatic arthrotomy. A small Baker's cyst is noted. Ligaments Suboptimally assessed by CT. Muscles and Tendons Musculotendinous structures appear grossly intact. Soft tissues Irregularity within the soft tissues at the anterolateral aspect of the knee corresponding to reported laceration. There is a ill-defined hyperdense fluid collection/hematoma within the superficial soft tissues at this location measuring approximately 5.5 x 1.9 x 4.3 cm. Hematoma overlies the lateral patellar retinaculum. No radiopaque foreign body within the soft tissues. IMPRESSION: 1. Anterolateral left knee laceration with superficial hematoma measuring approximately 5.5 x 1.9 x 4.3 cm. No radiopaque  foreign body within the soft tissues. 2. No acute osseous abnormality. No evidence of traumatic arthrotomy. 3. Mild-moderate tricompartmental osteoarthritis of the left knee, most pronounced within the medial and patellofemoral compartments. 4. 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 5. Small Baker's cyst. Electronically Signed   By: Davina Poke D.O.   On: 07/18/2020 16:46    Scheduled Meds: . acetaminophen  1,000 mg Oral Q8H  . aspirin  325 mg Oral Daily  . atorvastatin  40 mg Oral Daily  . celecoxib  100  mg Oral BID   Continuous Infusions: .  ceFAZolin (ANCEF) IV 2 g (07/19/20 1013)    Assessment/Plan:  1. Chainsaw laceration and hematoma left knee.  Went to the operating room yesterday for debridement.  Will need follow-up with wound care upon disposition and transitional care team working on this. 2. Essential hypertension blood pressure on the lower side will hold losartan for right now 3. 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  Previous hemoglobin A1c 6.9 a couple months ago.  Patient on glipizide and Metformin as outpatient.  Metformin not a great medication when we have chronic kidney disease.  Follow-up with PMD as outpatient 4. Hyperlipidemia unspecified on Lipitor 5. History of CAD on aspirin and Lipitor 6. Acute blood loss anemia with chainsaw laceration and hematoma of left knee. 7. Pedunculated osteochondroma seen on the proximal tibial metaphysis measuring 2.9 x 1.5 cm.  Follow-up with orthopedics as outpatient.        Code Status:     Code Status Orders  (From admission, onward)         Start     Ordered   07/18/20 2208  Full code  Continuous        07/18/20 2207        Code Status History    Date Active Date Inactive Code Status Order ID Comments User Context   04/21/2015 1357 04/26/2015 1500 Full Code [id number]  Odis Luster Inpatient   04/19/2015 6945 04/21/2015 1357 Full Code [id number]  Arnoldo Lenis Inpatient   04/18/2015 0957 04/19/2015 0937 Full Code [id number]  Yolonda Kida, MD Inpatient   04/17/2015 0437 04/18/2015 0957 Full Code [id number]  Lance Coon, MD Inpatient   Advance Care Planning Activity    Advance Directive Documentation   Flowsheet Row Most Recent Value  Type of Advance Directive Living will  Pre-existing out of facility DNR order (yellow form or pink MOST form) --  "MOST" Form in Place? --     Disposition Plan: Status is: Observation  Dispo: The patient is from: Home               Anticipated d/c is to: Home              Patient currently awaiting orthopedic evaluation to decide on discharge or not   Difficult to place patient.  No.  Time spent: 28 minutes  [place]

## 2020-07-19 NOTE — TOC Progression Note (Signed)
Transition of Care [facility]) - Progression Note    Patient Details  Name: [name] MRN: [id number] Date of Birth: 05-Jul-1945  Transition of [facility]) CM/SW [place], RN Phone Number: 07/19/2020, 12:21 PM  Clinical Narrative:   Called the [place] [facility] and got a voice mail, I left a message requesting a call back         Expected Discharge Plan and Services                                                 Social Determinants of Health (SDOH) Interventions    Readmission Risk Interventions No flowsheet data found.

## 2020-07-19 NOTE — Progress Notes (Signed)
Subjective:  Patient reports pain as well controlled.  He has been able to ambulate to the bathroom.  Objective:   VITALS:   Vitals:   07/18/20 2207 07/19/20 0018 07/19/20 0455 07/19/20 0810  BP: 120/65 116/70 (!) 110/51 (!) 108/54  Pulse: 70 74 72 63  Resp: 16 16 17 16   Temp: 97.6 F (36.4 C) 97.7 F (36.5 C) (!) 97.4 F (36.3 C) 97.9 F (36.6 C)  TempSrc: Oral Oral Oral   SpO2: 98% 96% 99% 96%  Weight:      Height:        PHYSICAL EXAM:  General: Alert, comfortable Left lower extremity: Wound VAC in place holding suction appropriately.  Skin is clean and dry.  Left lower extremity is neurovascular intact  LABS  Results for orders placed or performed during the hospital encounter of 07/18/20 (from the past 24 hour(s))  CBC with Differential     Status: Abnormal   Collection Time: 07/18/20  4:58 PM  Result Value Ref Range   WBC 5.7 4.0 - 10.5 K/uL   RBC 5.95 (H) 4.22 - 5.81 MIL/uL   Hemoglobin 11.6 (L) 13.0 - 17.0 g/dL   HCT 36.9 (L) 39.0 - 52.0 %   MCV 62.0 (L) 80.0 - 100.0 fL   MCH 19.5 (L) 26.0 - 34.0 pg   MCHC 31.4 30.0 - 36.0 g/dL   RDW 17.3 (H) 11.5 - 15.5 %   Platelets 138 (L) 150 - 400 K/uL   nRBC 0.0 0.0 - 0.2 %   Neutrophils Relative % 64 %   Neutro Abs 3.7 1.7 - 7.7 K/uL   Lymphocytes Relative 22 %   Lymphs Abs 1.2 0.7 - 4.0 K/uL   Monocytes Relative 10 %   Monocytes Absolute 0.6 0.1 - 1.0 K/uL   Eosinophils Relative 3 %   Eosinophils Absolute 0.2 0.0 - 0.5 K/uL   Basophils Relative 1 %   Basophils Absolute 0.0 0.0 - 0.1 K/uL   WBC Morphology MORPHOLOGY UNREMARKABLE    RBC Morphology MORPHOLOGY UNREMARKABLE    Smear Review MORPHOLOGY UNREMARKABLE    Immature Granulocytes 0 %   Abs Immature Granulocytes 0.02 0.00 - 0.07 K/uL  Comprehensive metabolic panel     Status: Abnormal   Collection Time: 07/18/20  4:58 PM  Result Value Ref Range   Sodium 135 135 - 145 mmol/L   Potassium 4.3 3.5 - 5.1 mmol/L   Chloride 105 98 - 111 mmol/L   CO2 22 22  - 32 mmol/L   Glucose, Bld 105 (H) 70 - 99 mg/dL   BUN 29 (H) 8 - 23 mg/dL   Creatinine, Ser 1.61 (H) 0.61 - 1.24 mg/dL   Calcium 9.1 8.9 - 10.3 mg/dL   Total Protein 6.8 6.5 - 8.1 g/dL   Albumin 4.1 3.5 - 5.0 g/dL   AST 21 15 - 41 U/L   ALT 17 0 - 44 U/L   Alkaline Phosphatase 60 38 - 126 U/L   Total Bilirubin 1.3 (H) 0.3 - 1.2 mg/dL   GFR, Estimated 45 (L) >60 mL/min   Anion gap 8 5 - 15  Resp Panel by RT-PCR (Flu A&B, Covid) Nasopharyngeal Swab     Status: None   Collection Time: 07/18/20  4:58 PM   Specimen: Nasopharyngeal Swab; Nasopharyngeal(NP) swabs in vial transport medium  Result Value Ref Range   SARS Coronavirus 2 by RT PCR NEGATIVE NEGATIVE   Influenza A by PCR NEGATIVE NEGATIVE   Influenza B by PCR NEGATIVE  NEGATIVE  Type and screen [place]     Status: None   Collection Time: 07/18/20  4:59 PM  Result Value Ref Range   ABO/RH(D) A POS    Antibody Screen NEG    Sample Expiration      07/21/2020,2359 Performed at [facility], [address] [place], [place] [address]   Glucose, capillary     Status: None   Collection Time: 07/18/20  7:10 PM  Result Value Ref Range   Glucose-Capillary 96 70 - 99 mg/dL   Comment 1 Notify RN    Comment 2 Document in Chart   CBG monitoring, ED     Status: None   Collection Time: 07/18/20  9:05 PM  Result Value Ref Range   Glucose-Capillary 84 70 - 99 mg/dL  CBC     Status: Abnormal   Collection Time: 07/19/20  4:43 AM  Result Value Ref Range   WBC 6.3 4.0 - 10.5 K/uL   RBC 4.77 4.22 - 5.81 MIL/uL   Hemoglobin 9.4 (L) 13.0 - 17.0 g/dL   HCT 29.9 (L) 39.0 - 52.0 %   MCV 62.7 (L) 80.0 - 100.0 fL   MCH 19.7 (L) 26.0 - 34.0 pg   MCHC 31.4 30.0 - 36.0 g/dL   RDW 16.8 (H) 11.5 - 15.5 %   Platelets 104 (L) 150 - 400 K/uL   nRBC 0.0 0.0 - 0.2 %  Comprehensive metabolic panel     Status: Abnormal   Collection Time: 07/19/20  4:43 AM  Result Value Ref Range   Sodium 136 135 - 145 mmol/L    Potassium 4.9 3.5 - 5.1 mmol/L   Chloride 103 98 - 111 mmol/L   CO2 25 22 - 32 mmol/L   Glucose, Bld 215 (H) 70 - 99 mg/dL   BUN 26 (H) 8 - 23 mg/dL   Creatinine, Ser 1.78 (H) 0.61 - 1.24 mg/dL   Calcium 8.9 8.9 - 10.3 mg/dL   Total Protein 5.7 (L) 6.5 - 8.1 g/dL   Albumin 3.4 (L) 3.5 - 5.0 g/dL   AST 19 15 - 41 U/L   ALT 16 0 - 44 U/L   Alkaline Phosphatase 49 38 - 126 U/L   Total Bilirubin 1.0 0.3 - 1.2 mg/dL   GFR, Estimated 40 (L) >60 mL/min   Anion gap 8 5 - 15    CT Knee Left Wo Contrast  Result Date: 07/18/2020 CLINICAL DATA:  Left leg laceration from chain saw EXAM: CT OF THE LEFT KNEE WITHOUT CONTRAST TECHNIQUE: Multidetector CT imaging of the left knee was performed according to the standard protocol. Multiplanar CT image reconstructions were also generated. COMPARISON:  None. FINDINGS: Bones/Joint/Cartilage No acute fracture of the left knee. Patella intact. Mild-moderate tricompartmental osteoarthritis of the left knee as manifested by joint space narrowing, subchondral sclerosis, and marginal osteophyte formation. Findings are most pronounced within the medial and patellofemoral compartments. 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 Neck measures approximately 1.1 cm. No fluid collection is seen overlying the bony protuberance. No suspicious bone lesion. No significant knee joint effusion. No air is seen within the joint space to suggest traumatic arthrotomy. A small Baker's cyst is noted. Ligaments Suboptimally assessed by CT. Muscles and Tendons Musculotendinous structures appear grossly intact. Soft tissues Irregularity within the soft tissues at the anterolateral aspect of the knee corresponding to reported laceration. There is a ill-defined  hyperdense fluid collection/hematoma within the superficial soft tissues at this location measuring approximately 5.5 x 1.9 x 4.3  cm. Hematoma overlies the lateral patellar retinaculum. No radiopaque foreign body within the soft tissues. IMPRESSION: 1. Anterolateral left knee laceration with superficial hematoma measuring approximately 5.5 x 1.9 x 4.3 cm. No radiopaque foreign body within the soft tissues. 2. No acute osseous abnormality. No evidence of traumatic arthrotomy. 3. Mild-moderate tricompartmental osteoarthritis of the left knee, most pronounced within the medial and patellofemoral compartments. 4. 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 5. Small Baker's cyst. Electronically Signed   By: Davina Poke D.O.   On: 07/18/2020 16:46    Assessment/Plan: 1 Day Post-Op   Principal Problem:   Laceration of left knee Active Problems:   DM type 2 causing CKD stage 3 (HCC)   Essential hypertension   Coronary artery disease   CAD of autologous bypass graft   Knee laceration, left, initial encounter   Laceration and contusion of cerebral cortex ([place])   Hyperlipidemia  Appreciate hospitalist team support Plan for discharge home today We will arrange for wound care either at the wound care center or through home health to start next week Will prescribe oral clindamycin 3 times daily for 10 days PT OT: Weightbearing as tolerated, knee range of motion 0 to 30 degrees, avoid excess knee flexion to take tension off of the wound Patient will follow up with me in clinic in 2 weeks for wound check   Renee Harder , MD 07/19/2020, 12:52 PM

## 2020-07-20 NOTE — Anesthesia Postprocedure Evaluation (Signed)
Anesthesia Post Note  Patient: [name]  Procedure(s) Performed: INCISION AND DRAINAGE LEFT KNEE (Left )  Patient location during evaluation: PACU Anesthesia Type: General Level of consciousness: awake and alert and oriented Pain management: pain level controlled Vital Signs Assessment: post-procedure vital signs reviewed and stable Respiratory status: spontaneous breathing Cardiovascular status: blood pressure returned to baseline Anesthetic complications: no   No complications documented.   Last Vitals:  Vitals:   07/19/20 0455 07/19/20 0810  BP: (!) 110/51 (!) 108/54  Pulse: 72 63  Resp: 17 16  Temp: (!) 36.3 C 36.6 C  SpO2: 99% 96%    Last Pain:  Vitals:   07/19/20 0820  TempSrc:   PainSc: 3                  Shamaine Mulkern

## 2020-07-25 ENCOUNTER — Encounter: Payer: Medicare Other | Attending: Physician Assistant | Admitting: Physician Assistant

## 2020-07-25 ENCOUNTER — Other Ambulatory Visit: Payer: Self-pay

## 2020-07-25 DIAGNOSIS — L97822 Non-pressure chronic ulcer of other part of left lower leg with fat layer exposed: Secondary | ICD-10-CM | POA: Insufficient documentation

## 2020-07-25 DIAGNOSIS — S81012D Laceration without foreign body, left knee, subsequent encounter: Secondary | ICD-10-CM | POA: Diagnosis not present

## 2020-07-25 DIAGNOSIS — N183 Chronic kidney disease, stage 3 unspecified: Secondary | ICD-10-CM | POA: Insufficient documentation

## 2020-07-25 DIAGNOSIS — T8131XA Disruption of external operation (surgical) wound, not elsewhere classified, initial encounter: Secondary | ICD-10-CM | POA: Diagnosis not present

## 2020-07-25 DIAGNOSIS — I129 Hypertensive chronic kidney disease with stage 1 through stage 4 chronic kidney disease, or unspecified chronic kidney disease: Secondary | ICD-10-CM | POA: Insufficient documentation

## 2020-07-25 DIAGNOSIS — W293XXD Contact with powered garden and outdoor hand tools and machinery, subsequent encounter: Secondary | ICD-10-CM | POA: Diagnosis not present

## 2020-07-25 DIAGNOSIS — Y839 Surgical procedure, unspecified as the cause of abnormal reaction of the patient, or of later complication, without mention of misadventure at the time of the procedure: Secondary | ICD-10-CM | POA: Diagnosis not present

## 2020-07-25 DIAGNOSIS — D561 Beta thalassemia: Secondary | ICD-10-CM | POA: Insufficient documentation

## 2020-07-25 DIAGNOSIS — E11622 Type 2 diabetes mellitus with other skin ulcer: Secondary | ICD-10-CM | POA: Insufficient documentation

## 2020-07-25 DIAGNOSIS — L97829 Non-pressure chronic ulcer of other part of left lower leg with unspecified severity: Secondary | ICD-10-CM | POA: Diagnosis present

## 2020-07-25 DIAGNOSIS — E1122 Type 2 diabetes mellitus with diabetic chronic kidney disease: Secondary | ICD-10-CM | POA: Diagnosis not present

## 2020-07-25 NOTE — Progress Notes (Signed)
KYAIR, DITOMMASO ([id number]) Visit Report for 07/25/2020 Abuse/Suicide Risk Screen Details Patient Name: [name], [name]. Date of Service: 07/25/2020 10:15 AM Medical Record Number: [id number] Patient Account Number: [id number] Date of Birth/Sex: [birth year] (76 y.o. M) Treating RN: Donnamarie Poag Primary Care Aracelis Ulrey: Otilio Miu Other Clinician: Jeanine Luz Referring Ikeisha Blumberg: Renee Harder Treating Suhana Wilner/Extender: Skipper Cliche in Treatment: 0 Abuse/Suicide Risk Screen Items Answer ABUSE RISK SCREEN: Has anyone close to you tried to hurt or harm you recentlyo No Do you feel uncomfortable with anyone in your familyo No Has anyone forced you do things that you didnot want to doo No Electronic Signature(s) Signed: 07/25/2020 4:18:00 PM By: Donnamarie Poag Entered By: Donnamarie Poag on 07/25/2020 10:25:16 Perrott, [name] ([id number]) -------------------------------------------------------------------------------- Activities of Daily Living Details Patient Name: Robert Dolly T. Date of Service: 07/25/2020 10:15 AM Medical Record Number: [id number] Patient Account Number: [id number] Date of Birth/Sex: December 31, 1945 (75 y.o. M) Treating RN: Donnamarie Poag Primary Care Glynnis Gavel: Otilio Miu Other Clinician: Jeanine Luz Referring Viktorya Arguijo: Renee Harder Treating Aundre Hietala/Extender: Skipper Cliche in Treatment: 0 Activities of Daily Living Items Answer Activities of Daily Living (Please select one for each item) Drive Automobile Completely Able Take Medications Completely Able Use Telephone Completely Able Care for Appearance Completely Able Use Toilet Completely Able Bath / Shower Completely Able Dress Self Completely Able Feed Self Completely Able Walk Completely Able Get In / Out Bed Completely Able Housework Completely Able Prepare Meals Completely [place] for Self Completely Able Electronic Signature(s) Signed:  07/25/2020 4:18:00 PM By: Donnamarie Poag Entered By: Donnamarie Poag on 07/25/2020 10:25:37 Trostel, [name] ([id number]) -------------------------------------------------------------------------------- Education Screening Details Patient Name: Robert Dolly T. Date of Service: 07/25/2020 10:15 AM Medical Record Number: [id number] Patient Account Number: [id number] Date of Birth/Sex: [birth year] (75 y.o. M) Treating RN: Donnamarie Poag Primary Care Parlee Amescua: Otilio Miu Other Clinician: Jeanine Luz Referring Jessi Pitstick: Renee Harder Treating Hattie Aguinaldo/Extender: Skipper Cliche in Treatment: 0 Learning Preferences/Education Level/Primary Language Learning Preference: Explanation Highest Education Level: College or Above Preferred Language: English Cognitive Barrier Language Barrier: No Translator Needed: No Memory Deficit: No Emotional Barrier: No Cultural/Religious Beliefs Affecting Medical Care: No Physical Barrier Impaired Vision: No Impaired Hearing: No Decreased Hand dexterity: No Knowledge/Comprehension Knowledge Level: High Comprehension Level: High Ability to understand written instructions: High Ability to understand verbal instructions: High Motivation Anxiety Level: Calm Cooperation: Cooperative Education Importance: Acknowledges Need Interest in Health Problems: Asks Questions Perception: Coherent Willingness to Engage in Self-Management High Activities: Readiness to Engage in Self-Management High Activities: Electronic Signature(s) Signed: 07/25/2020 4:18:00 PM By: Donnamarie Poag Entered By: Donnamarie Poag on 07/25/2020 10:26:08 [name] ([id number]) -------------------------------------------------------------------------------- Fall Risk Assessment Details Patient Name: Robert Dolly T. Date of Service: 07/25/2020 10:15 AM Medical Record Number: [id number] Patient Account Number: [id number] Date of Birth/Sex: 06/23/1945 (75 y.o. M) Treating  RN: Donnamarie Poag Primary Care Carvell Hoeffner: Otilio Miu Other Clinician: Jeanine Luz Referring Burns Timson: Renee Harder Treating Beverley Sherrard/Extender: Skipper Cliche in Treatment: 0 Fall Risk Assessment Items Have you had 2 or more falls in the last 12 monthso 0 No Have you had any fall that resulted in injury in the last 12 monthso 0 No FALLS RISK SCREEN History of falling - immediate or within 3 months 0 No Secondary diagnosis (Do you have 2 or more medical diagnoseso) 0 No Ambulatory aid None/bed rest/wheelchair/nurse 0 Yes Crutches/cane/walker 0 No Furniture 0 No Intravenous therapy Access/Saline/Heparin Lock 0 No Gait/Transferring Normal/ bed rest/ wheelchair 0 Yes Weak (short steps  with or without shuffle, stooped but able to lift head while walking, may 0 No seek support from furniture) Impaired (short steps with shuffle, may have difficulty arising from chair, head down, impaired 0 No balance) Mental Status Oriented to own ability 0 Yes Electronic Signature(s) Signed: 07/25/2020 4:18:00 PM By: Donnamarie Poag Entered By: Donnamarie Poag on 07/25/2020 10:26:25 [name] ([id number]) -------------------------------------------------------------------------------- Foot Assessment Details Patient Name: Robert Dolly T. Date of Service: 07/25/2020 10:15 AM Medical Record Number: [id number] Patient Account Number: [id number] Date of Birth/Sex: Aug 23, 1945 (75 y.o. M) Treating RN: Donnamarie Poag Primary Care Elianis Fischbach: Otilio Miu Other Clinician: Jeanine Luz Referring Natasha Burda: Renee Harder Treating Chrishawna Farina/Extender: Skipper Cliche in Treatment: 0 Foot Assessment Items Site Locations + = Sensation present, - = Sensation absent, C = Callus, U = Ulcer R = Redness, W = Warmth, M = Maceration, PU = Pre-ulcerative lesion F = Fissure, S = Swelling, D = Dryness Assessment Right: Left: Other Deformity: No No Prior Foot Ulcer: No No Prior Amputation: No  No Charcot Joint: No No Ambulatory Status: Ambulatory Without Help Gait: Steady Electronic Signature(s) Signed: 07/25/2020 4:18:00 PM By: Donnamarie Poag Entered By: Donnamarie Poag on 07/25/2020 10:27:31 Ivancic, [name] ([id number]) -------------------------------------------------------------------------------- Nutrition Risk Screening Details Patient Name: Robert Dolly T. Date of Service: 07/25/2020 10:15 AM Medical Record Number: [id number] Patient Account Number: [id number] Date of Birth/Sex: 05/16/1945 (75 y.o. M) Treating RN: Donnamarie Poag Primary Care Xadrian Craighead: Otilio Miu Other Clinician: Jeanine Luz Referring Baby Stairs: Renee Harder Treating Dangelo Guzzetta/Extender: Skipper Cliche in Treatment: 0 Height (in): 70 Weight (lbs): 190 Body Mass Index (BMI): 27.3 Nutrition Risk Screening Items Score Screening NUTRITION RISK SCREEN: I have an illness or condition that made me change the kind and/or amount of food I eat 0 No I eat fewer than two meals per day 0 No I eat few fruits and vegetables, or milk products 0 No I have three or more drinks of beer, liquor or wine almost every day 0 No I have tooth or mouth problems that make it hard for me to eat 0 No I don't always have enough money to buy the food I need 0 No I eat alone most of the time 0 No I take three or more different prescribed or over-the-counter drugs a day 1 Yes Without wanting to, I have lost or gained 10 pounds in the last six months 0 No I am not always physically able to shop, cook and/or feed myself 0 No Nutrition Protocols Good Risk Protocol 0 No interventions needed Moderate Risk Protocol High Risk Proctocol Risk Level: Good Risk Score: 1 Electronic Signature(s) Signed: 07/25/2020 4:18:00 PM By: Donnamarie Poag Entered ByDonnamarie Poag on 07/25/2020 10:27:08

## 2020-07-25 NOTE — Progress Notes (Signed)
[name], [name] ([id number]) Visit Report for 07/25/2020 Allergy List Details Patient Name: [name], [name]. Date of Service: 07/25/2020 10:15 AM Medical Record Number: [id number] Patient Account Number: [id number] Date of Birth/Sex: 1945-12-25 (75 y.o. M) Treating Harmon: [name] Primary Care [name]: [name] Other Clinician: Jeanine Harmon Referring [name]: [name] Treating [name]/Extender: [name] in Treatment: 0 Allergies Active Allergies No Known Allergies Allergy Notes Electronic Signature(s) Signed: 07/25/2020 4:18:00 PM By: [name] Entered By: [name] on 07/25/2020 10:20:56 [name], [name] ([id number]) -------------------------------------------------------------------------------- [place] Information Details Patient Name: [name]. Date of Service: 07/25/2020 10:15 AM Medical Record Number: [id number] Patient Account Number: [id number] Date of Birth/Sex: 07-07-1945 (75 y.o. M) Treating Harmon: [name] Primary Care [name]: [name] Other Clinician: Jeanine Harmon Referring Markez Dowland: [name] Treating Grasiela Jonsson/Extender: [name] in Treatment: 0 Visit Information Patient Arrived: Ambulatory Arrival Time: 10:04 Accompanied By: self Transfer Assistance: None Patient Identification Verified: Yes Secondary Verification Process Completed: Yes Patient Has Alerts: Yes Patient Alerts: Patient on Blood Thinner on ASPIRIN DIABETIC Electronic Signature(s) Signed: 07/25/2020 4:18:00 PM By: [name] Entered By: [name] on 07/25/2020 10:10:19 [name], [name] ([id number]) -------------------------------------------------------------------------------- Clinic Level of Care Assessment Details Patient Name: [name]. Date of Service: 07/25/2020 10:15 AM Medical Record Number: [id number] Patient Account Number: [id number] Date of Birth/Sex: [birth year] (75 y.o. M) Treating Harmon:  [name] Primary Care [name]: [name] Other Clinician: Jeanine Harmon Referring Jamaury Gumz: [name] Treating Naya Ilagan/Extender: [name] in Treatment: 0 Clinic Level of Care Assessment Items TOOL 2 Quantity Score X - Use when only an EandM is performed on the INITIAL visit 1 0 ASSESSMENTS - Nursing Assessment / Reassessment X - General Physical Exam (combine w/ comprehensive assessment (listed just below) when performed on new 1 20 pt. evals) X- 1 25 Comprehensive Assessment (HX, ROS, Risk Assessments, Wounds Hx, etc.) ASSESSMENTS - Wound and Skin Assessment / Reassessment X - Simple Wound Assessment / Reassessment - one wound 1 5 []  - 0 Complex Wound Assessment / Reassessment - multiple wounds []  - 0 Dermatologic / Skin Assessment (not related to wound area) ASSESSMENTS - Ostomy and/or Continence Assessment and Care []  - Incontinence Assessment and Management 0 []  - 0 Ostomy Care Assessment and Management (repouching, etc.) PROCESS - Coordination of Care X - Simple Patient / Family Education for ongoing care 1 15 []  - 0 Complex (extensive) Patient / Family Education for ongoing care X- 1 10 Staff obtains Consents, Records, Test Results / Process Orders []  - 0 Staff telephones HHA, Nursing Homes / Clarify orders / etc []  - 0 Routine Transfer to another Facility (non-emergent condition) []  - 0 Routine Hospital Admission (non-emergent condition) []  - 0 New Admissions / Biomedical engineer / Ordering NPWT, Apligraf, etc. []  - 0 Emergency Hospital Admission (emergent condition) X- 1 10 Simple Discharge Coordination []  - 0 Complex (extensive) Discharge Coordination PROCESS - Special Needs []  - Pediatric / Minor Patient Management 0 []  - 0 Isolation Patient Management []  - 0 Hearing / Language / Visual special needs []  - 0 Assessment of Community assistance (transportation, D/C planning, etc.) []  - 0 Additional assistance / Altered  mentation []  - 0 Support Surface(s) Assessment (bed, cushion, seat, etc.) INTERVENTIONS - Wound Cleansing / Measurement X - Wound Imaging (photographs - any number of wounds) 1 5 []  - 0 Wound Tracing (instead of photographs) X- 1 5 Simple Wound Measurement - one wound []  - 0 Complex Wound Measurement - multiple wounds [name],  [name] ([id number]) X- 1 5 Simple Wound Cleansing - one wound []  - 0 Complex Wound Cleansing - multiple wounds INTERVENTIONS - Wound Dressings []  - Small Wound Dressing one or multiple wounds 0 X- 1 15 Medium Wound Dressing one or multiple wounds []  - 0 Large Wound Dressing one or multiple wounds []  - 0 Application of Medications - injection INTERVENTIONS - Miscellaneous []  - External ear exam 0 []  - 0 Specimen Collection (cultures, biopsies, blood, body fluids, etc.) []  - 0 Specimen(s) / Culture(s) sent or taken to Lab for analysis []  - 0 Patient Transfer (multiple staff / Harrel Lemon Lift / Similar devices) []  - 0 Simple Staple / Suture removal (25 or less) []  - 0 Complex Staple / Suture removal (26 or more) []  - 0 Hypo / Hyperglycemic Management (close monitor of Blood Glucose) X- 1 15 Ankle / Brachial Index (ABI) - do not check if billed separately Has the patient been seen at the hospital within the last three years: Yes Total Score: 130 Level Of Care: New/Established - Level 4 Electronic Signature(s) Signed: 07/25/2020 5:05:23 PM By: [name], [name] Entered By: [name] on 07/25/2020 11:19:41 [name], [name] ([id number]) -------------------------------------------------------------------------------- Encounter Discharge Information Details Patient Name: Robert Dolly T. Date of Service: 07/25/2020 10:15 AM Medical Record Number: [id number] Patient Account Number: [id number] Date of Birth/Sex: [birth year] (75 y.o. M) Treating Harmon: [name] Primary Care Shuronda Santino: [name] Other Clinician: Jeanine Harmon Referring Cesily Cuoco: [name] Treating Davanna He/Extender: [name] in Treatment: 0 Encounter Discharge Information Items Discharge Condition: Stable Ambulatory Status: Ambulatory Discharge Destination: Home Transportation: Private Auto Accompanied By: self Schedule Follow-up Appointment: Yes Clinical Summary of Care: Electronic Signature(s) Signed: 07/25/2020 4:18:00 PM By: [name] Entered By: [name] on 07/25/2020 11:35:05 Enriques, [name] ([id number]) -------------------------------------------------------------------------------- Lower Extremity Assessment Details Patient Name: Robert Dolly T. Date of Service: 07/25/2020 10:15 AM Medical Record Number: [id number] Patient Account Number: [id number] Date of Birth/Sex: 12-01-1945 (75 y.o. M) Treating Harmon: [name] Primary Care Lillyonna Armstead: [name] Other Clinician: Jeanine Harmon Referring Vivica Dobosz: [name] Treating Hardy Harcum/Extender: [name] in Treatment: 0 Edema Assessment Assessed: [Left: Yes] [Right: No] Edema: [Left: N] [Right: o] Calf Left: Right: Point of Measurement: 30 cm From Medial Instep 37 cm Ankle Left: Right: Point of Measurement: 10 cm From Medial Instep 22.3 cm Knee To Floor Left: Right: From Medial Instep 40 cm Vascular Assessment Pulses: Dorsalis Pedis Palpable: [Left:Yes] Blood Pressure: Brachial: [Left:130] Dorsalis Pedis: 152 Ankle: Posterior Tibial: 154 Ankle Brachial Index: [Left:1.18] Electronic Signature(s) Signed: 07/25/2020 4:18:00 PM By: [name] Entered ByDonnamarie Harmon on 07/25/2020 10:41:57 [name], [name] ([id number]) -------------------------------------------------------------------------------- Multi Wound Chart Details Patient Name: Robert Dolly T. Date of Service: 07/25/2020 10:15 AM Medical Record Number: [id number] Patient Account Number: [id number] Date of Birth/Sex: 08/01/1945 (75 y.o. M) Treating Harmon:  [name] Primary Care Jacody Beneke: [name] Other Clinician: Jeanine Harmon Referring Para Cossey: [name] Treating Jsean Taussig/Extender: [name] in Treatment: 0 Vital Signs Height(in): 70 Pulse(bpm): 84 Weight(lbs): 190 Blood Pressure(mmHg): 128/64 Body Mass Index(BMI): 27 Temperature(F): 97.6 Respiratory Rate(breaths/min): 16 Photos: [N/A:N/A] Wound Location: Left, Lateral Knee N/A N/A Wounding Event: Trauma N/A N/A Primary Etiology: Open Surgical Wound N/A N/A Date Acquired: 07/18/2020 N/A N/A Harmon of Treatment: 0 N/A N/A Wound Status: Open N/A N/A Measurements L x W x D (cm) 10.5x5.5x0.2 N/A N/A Area (cm) : 45.357 N/A N/A Volume (cm) : 9.071 N/A N/A Classification: Full Thickness Without Exposed N/A N/A Support Structures  Exudate Amount: Large N/A N/A Exudate Type: Sanguinous N/A N/A Exudate Color: red N/A N/A Granulation Amount: Medium (34-66%) N/A N/A Granulation Quality: Red, Pink N/A N/A Necrotic Amount: Medium (34-66%) N/A N/A Necrotic Tissue: Eschar, Adherent Slough N/A N/A Exposed Structures: Fat Layer (Subcutaneous Tissue): N/A N/A Yes Fascia: No Tendon: No Muscle: No Joint: No Bone: No Assessment Notes: WOUND VAC REMOVED WITH FOAM N/A N/A INTACT ONE PIECE Treatment Notes Electronic Signature(s) Signed: 07/25/2020 5:05:23 PM By: [name], [name] Entered By: [name], Robert Breeding on 07/25/2020 11:12:25 [name], [name] ([id number]) -------------------------------------------------------------------------------- [place] Details Patient Name: Robert Dolly T. Date of Service: 07/25/2020 10:15 AM Medical Record Number: [id number] Patient Account Number: [id number] Date of Birth/Sex: 16-Jul-1945 (75 y.o. M) Treating Harmon: [name] Primary Care Minyon Billiter: [name] Other Clinician: Jeanine Harmon Referring Muhamad Serano: [name] Treating Susan Bleich/Extender: [name] in  Treatment: 0 Active Inactive Orientation to the Wound Care Program Nursing Diagnoses: Knowledge deficit related to the wound healing center program Goals: Patient/caregiver will verbalize understanding of the [place] Program Date Initiated: 07/25/2020 Target Resolution Date: 07/25/2020 Goal Status: Active Interventions: Provide education on orientation to the wound center Notes: Wound/Skin Impairment Nursing Diagnoses: Impaired tissue integrity Goals: Patient/caregiver will verbalize understanding of skin care regimen Date Initiated: 07/25/2020 Target Resolution Date: 07/25/2020 Goal Status: Active Ulcer/skin breakdown will have a volume reduction of 30% by week 4 Date Initiated: 07/25/2020 Target Resolution Date: 08/24/2020 Goal Status: Active Ulcer/skin breakdown will have a volume reduction of 50% by week 8 Date Initiated: 07/25/2020 Target Resolution Date: 09/24/2020 Goal Status: Active Ulcer/skin breakdown will have a volume reduction of 80% by week 12 Date Initiated: 07/25/2020 Target Resolution Date: 10/24/2020 Goal Status: Active Ulcer/skin breakdown will heal within 14 Harmon Date Initiated: 07/25/2020 Target Resolution Date: 11/24/2020 Goal Status: Active Interventions: Assess patient/caregiver ability to obtain necessary supplies Assess patient/caregiver ability to perform ulcer/skin care regimen upon admission and as needed Assess ulceration(s) every visit Provide education on ulcer and skin care Treatment Activities: Referred to DME [name] for dressing supplies : 07/25/2020 Skin care regimen initiated : 07/25/2020 Notes: Electronic Signature(s) Signed: 07/25/2020 5:05:23 PM By: [name], [name] Entered By: [name], Robert Breeding on 07/25/2020 11:12:02 [name], [name] ([id number]) [name], [name] ([id number]) -------------------------------------------------------------------------------- Pain Assessment Details Patient Name: Robert Dolly T. Date of Service: 07/25/2020 10:15 AM Medical Record Number: [id number] Patient Account Number: [id number] Date of Birth/Sex: [birth year] (75 y.o. M) Treating Harmon: [name] Primary Care Alan Drummer: [name] Other Clinician: Jeanine Harmon Referring Temisha Murley: [name] Treating Maurie Musco/Extender: [name] in Treatment: 0 Active Problems Location of Pain Severity and Description of Pain Patient Has Paino Yes Site Locations Pain Location: Pain in Ulcers Rate the pain. Current Pain Level: 1 Pain Management and Medication Current Pain Management: Notes reports as pressure left knee Electronic Signature(s) Signed: 07/25/2020 4:18:00 PM By: [name] Entered By: [name] on 07/25/2020 10:05:24 [name] ([id number]) -------------------------------------------------------------------------------- Patient/Caregiver Education Details Patient Name: [name]. Date of Service: 07/25/2020 10:15 AM Medical Record Number: [id number] Patient Account Number: [id number] Date of Birth/Gender: 1945-06-30 (75 y.o. M) Treating Harmon: [name] Primary Care Physician: [name] Other Clinician: Jeanine Harmon Referring Physician: Renee Harmon Treating Physician/Extender: [name] in Treatment: 0 Education Assessment Education Provided To: Patient Education Topics Provided Welcome To [facility]: Methods: Explain/Verbal Responses: State content correctly Wound/Skin Impairment: Methods: Explain/Verbal Responses: State content correctly Electronic Signature(s) Signed: 07/25/2020 5:05:23 PM By: [name], Robert Breeding  Harmon Entered By: [name], Robert Breeding on 07/25/2020 11:19:59 [name], [name] ([id number]) -------------------------------------------------------------------------------- Wound Assessment Details Patient Name: [name], SWOR. Date of Service: 07/25/2020 10:15 AM Medical Record Number:  [id number] Patient Account Number: [id number] Date of Birth/Sex: [birth year] (75 y.o. M) Treating Harmon: [name] Primary Care Vi Biddinger: [name] Other Clinician: Jeanine Harmon Referring Orbie Grupe: [name] Treating Jezabel Lecker/Extender: [name] in Treatment: 0 Wound Status Wound Number: 1 Primary Open Surgical Wound Etiology: Wound Location: Left, Lateral Knee Wound Status: Open Wounding Event: Trauma Notes: WOUND VAC IN PLACE UPON ADMISSION POST Date Acquired: 07/18/2020 SURGERY Harmon Of Treatment: 0 Clustered Wound: No Wound under treatment by Faryal Marxen outside of [place] Photos Wound Measurements Length: (cm) 10.5 Width: (cm) 5.5 Depth: (cm) 0.2 Area: (cm) 45.357 Volume: (cm) 9.071 % Reduction in Area: % Reduction in Volume: Tunneling: No Undermining: No Wound Description Classification: Full Thickness Without Exposed Support Structu Exudate Amount: Large Exudate Type: Sanguinous Exudate Color: red res Foul Odor After Cleansing: No Slough/Fibrino Yes Wound Bed Granulation Amount: Medium (34-66%) Exposed Structure Granulation Quality: Red, Pink Fascia Exposed: No Necrotic Amount: Medium (34-66%) Fat Layer (Subcutaneous Tissue) Exposed: Yes Necrotic Quality: Eschar, Adherent Slough Tendon Exposed: No Muscle Exposed: No Joint Exposed: No Bone Exposed: No Assessment Notes WOUND VAC REMOVED WITH FOAM INTACT ONE PIECE Treatment Notes Wound #1 (Knee) Wound Laterality: Left, Lateral Cleanser Normal Saline [name], [name] ([id number]) Discharge Instruction: Wash your hands with soap and water. Remove old dressing, discard into plastic bag and place into trash. Cleanse the wound with Normal Saline prior to applying a clean dressing using gauze sponges, not tissues or cotton balls. Do not scrub or use excessive force. Pat dry using gauze sponges, not tissue or cotton balls. Peri-Wound Care Topical Primary Dressing Iodoform 1/4x 5  (in/yd) Discharge Instruction: Apply Iodoform Packing Strip in 12-1 o'clock undermining (extras sent with patient) Silvercel Small 2x2 (in/in) Discharge Instruction: Apply Silvercel Small 2x2 (in/in) on wound bed Secondary Dressing ABD Pad 5x9 (in/in) Discharge Instruction: Cover with ABD pad Kerlix 4.5 x 4.1 (in/yd) Discharge Instruction: Apply Kerlix 4.5 x 4.1 (in/yd) as instructed Secured With 35M ACE Elastic Bandage With VELCRO Brand Closure, 4 (in) Discharge Instruction: Secure dressing Compression Wrap Compression Stockings Add-Ons Electronic Signature(s) Signed: 07/25/2020 4:18:00 PM By: [name] Entered By: [name] on 07/25/2020 10:20:34 Strada, [name] ([id number]) -------------------------------------------------------------------------------- [place] Details Patient Name: Robert Dolly T. Date of Service: 07/25/2020 10:15 AM Medical Record Number: [id number] Patient Account Number: [id number] Date of Birth/Sex: May 17, 1945 (75 y.o. M) Treating Harmon: [name] Primary Care Uma Jerde: [name] Other Clinician: Jeanine Harmon Referring Mumtaz Lovins: [name] Treating Paije Goodhart/Extender: [name] in Treatment: 0 Vital Signs Time Taken: 10:04 Temperature (F): 97.6 Height (in): 70 Pulse (bpm): 65 Source: Stated Respiratory Rate (breaths/min): 16 Weight (lbs): 190 Blood Pressure (mmHg): 128/64 Source: Stated Reference Range: 80 - 120 mg / dl Body Mass Index (BMI): 27.3 Electronic Signature(s) Signed: 07/25/2020 4:18:00 PM By: [name] Entered ByDonnamarie Harmon on 07/25/2020 10:08:14

## 2020-07-26 NOTE — Progress Notes (Signed)
NATHANUEL, CABREJA ([id number]) Visit Report for 07/25/2020 Chief Complaint Document Details Patient Name: [name], [name]. Date of Service: 07/25/2020 10:15 AM Medical Record Number: [id number] Patient Account Number: [id number] Date of Birth/Sex: Sep 18, 1945 (75 y.o. Male) Treating RN: Dolan Amen Primary Care Provider: Otilio Miu Other Clinician: Jeanine Luz Referring Provider: Renee Harder Treating Provider/Extender: Skipper Cliche in Treatment: 0 Information Obtained from: Patient Chief Complaint Left Knee Ulcer Electronic Signature(s) Signed: 07/25/2020 10:52:14 AM By: Worthy Keeler PA-C Entered By: Worthy Keeler on 07/25/2020 10:52:14 Trevathan, [name] ([id number]) -------------------------------------------------------------------------------- HPI Details Patient Name: [name]. Date of Service: 07/25/2020 10:15 AM Medical Record Number: [id number] Patient Account Number: [id number] Date of Birth/Sex: 1945-09-20 (75 y.o. Male) Treating RN: Dolan Amen Primary Care Provider: Otilio Miu Other Clinician: Jeanine Luz Referring Provider: Renee Harder Treating Provider/Extender: Skipper Cliche in Treatment: 0 History of Present Illness HPI Description: 07/25/2020 upon evaluation today patient presents for initial evaluation here in the clinic. I did have actually an extensive conversation with [facility] yesterday including the patient's physician who performed the surgery. Unfortunately this patient sustained a chainsaw injury on April 12 when he was working. He states he was cutting a limb when he saw the limb was likely going to fall but did not think that it would come his way. When it did it actually hit his arm and then knocked his arm down and he as he was holding a chainsaw this went into his leg on the lateral portion of his left knee. Subsequently he ended up going to urgent care which led to the orthopedic office which  led to him going into surgery for a multilayer closure. There is still a small area that is open unfortunately that could not be completely closed and the lower portion of the wound laterally. The patient fortunately is not having a significant amount of pain he does have a little bit of hematoma here it is going to be cleaned out. He did have a temporary wound VAC in place though again this does not appear to likely be necessary at this point based on what I am seeing her at least this version of the [facility] is not going to be the best way to go. The patient does have diabetes mellitus type 2 and he has a hemoglobin A1c of 6.9 on most recent check. He is also currently on clindamycin and has an ABI of 1.18 on this side. His blood sugar this morning was 95. He keeps a close eye on this. Otherwise the patient does have a history of hypertension, chronic kidney disease stage III, and beta thalassemia. Of note when I spoke with the patient's surgeon yesterday at [facility] he stated that this is a patient whom they would not be following he wanted to turn care over to [place]. I am definitely okay with that I just wanted to make sure that since the patient was in a postop global that there was not anything they still wanted to monitor but again the care is pretty much been turned over to [place] after that conversation which is why did work to get the patient in today so that we can give appropriate home health orders for the patient as there did not appear to be any direction that was being covered as far as providing direct orders to home health aide which is what ever the hospital case manager and recommended according to what the physician told me. Nonetheless I think that we can probably  get some better orders back to home health and get them out to see the patient of performing the appropriate dressing changes. Electronic Signature(s) Signed: 07/25/2020 6:15:38 PM By: Worthy Keeler PA-C Previous Signature:  07/25/2020 6:14:29 PM Version By: Worthy Keeler PA-C Entered By: Worthy Keeler on 07/25/2020 18:15:38 [name], [name] ([id number]) -------------------------------------------------------------------------------- Physical Exam Details Patient Name: [name]. Date of Service: 07/25/2020 10:15 AM Medical Record Number: [id number] Patient Account Number: [id number] Date of Birth/Sex: 05/06/1945 (75 y.o. Male) Treating RN: Dolan Amen Primary Care Provider: Otilio Miu Other Clinician: Jeanine Luz Referring Provider: Renee Harder Treating Provider/Extender: Skipper Cliche in Treatment: 0 Constitutional sitting or standing blood pressure is within target range for patient.. pulse regular and within target range for patient.Marland Kitchen respirations regular, non- labored and within target range for patient.Marland Kitchen temperature within target range for patient.. Well-nourished and well-hydrated in no acute distress. Eyes conjunctiva clear no eyelid edema noted. pupils equal round and reactive to light and accommodation. Ears, Nose, Mouth, and Throat no gross abnormality of ear auricles or external auditory canals. normal hearing noted during conversation. mucus membranes moist. Respiratory normal breathing without difficulty. Cardiovascular 2+ dorsalis pedis/posterior tibialis pulses. no clubbing, cyanosis, significant edema, <3 sec cap refill. Musculoskeletal normal gait and posture. Psychiatric this patient is able to make decisions and demonstrates good insight into disease process. Alert and Oriented x 3. pleasant and cooperative. Notes Upon inspection patient's wound bed actually showed signs of a fairly significant laceration fortunately that does not appear to be showing signs of infection and overall has for the most part doing quite well. With that being said I do believe that the patient is going to require some treatment in regard to the inferior/lateral portion of the  laceration site where to be honest he does have some depth to the wound here as well as some hematoma that I was able to mechanically debride away with saline and gauze as well as a Q-tip today. He has some undermining here but again I do not know that he necessarily requires a wound VAC currently. Working to see how things go although that may be a consideration depending on how this progresses. Electronic Signature(s) Signed: 07/25/2020 6:16:38 PM By: Worthy Keeler PA-C Entered By: Worthy Keeler on 07/25/2020 18:16:37 [name], [name] ([id number]) -------------------------------------------------------------------------------- Physician Orders Details Patient Name: [name]. Date of Service: 07/25/2020 10:15 AM Medical Record Number: [id number] Patient Account Number: [id number] Date of Birth/Sex: 07/03/1945 (75 y.o. 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 [facility] for wound care. May utilize formulary equivalent dressing for wound treatment orders unless otherwise specified. Home Health Nurse may visit PRN to address patientos wound care needs. o Scheduled days for dressing changes to be completed; exception, patient has scheduled wound care visit  that day. - Monday, Wednesday, Friday-dressings changed in office on scheduled appt day Bathing/ Shower/ Hygiene o Clean wound with  Normal Saline or wound cleanser. o No tub bath. Negative Pressure Wound Therapy o Discontinue NPWT. Wound Treatment Wound #1 - Knee Wound Laterality: Left, Lateral Cleanser: Normal Saline 3 x Per Week/30 Days Discharge Instructions: Wash your hands with soap and water. Remove old dressing, discard into plastic bag and place into trash. Cleanse the wound with Normal Saline prior to applying a clean dressing using gauze sponges, not tissues or cotton balls. Do not scrub or use excessive force. Pat dry using gauze sponges, not tissue or cotton balls. Primary Dressing: Iodoform 1/4x 5 (in/yd) 3 x Per Week/30 Days Discharge Instructions: Apply Iodoform Packing Strip in 12-1 o'clock undermining (extras sent with patient) Primary Dressing: Silvercel Small 2x2 (in/in) 3 x Per Week/30 Days Discharge Instructions: Apply Silvercel Small 2x2 (in/in) on wound bed Secondary Dressing: ABD Pad 5x9 (in/in) 3 x Per Week/30 Days Discharge Instructions: Cover with ABD pad Secondary Dressing: Kerlix 4.5 x 4.1 (in/yd) 3 x Per Week/30 Days Discharge Instructions: Apply Kerlix 4.5 x 4.1 (in/yd) as instructed Secured With: 83M ACE Elastic Bandage With VELCRO Brand Closure, 4 (in) 3 x Per Week/30 Days Discharge Instructions: Secure dressing Electronic Signature(s) Signed: 07/25/2020 5:05:23 PM By: Charlett Nose RN Signed: 07/26/2020 12:58:56 PM By: Jorge Ny, [place] ([id number]) Entered By: Georges Mouse, Minus Breeding on 07/25/2020 11:21:14 [name] ([id number]) -------------------------------------------------------------------------------- Problem List Details Patient Name: Robert Dolly T. Date of Service: 07/25/2020 10:15 AM Medical Record Number: [id number] Patient Account Number: [id number] Date of Birth/Sex: [birth year] (75 y.o.  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 with fat 07/25/2020 No Yes layer exposed E11.622 Type 2 diabetes mellitus with other skin ulcer 07/25/2020 No Yes I10 Essential (primary) hypertension 07/25/2020 No Yes N18.30 Chronic kidney disease, stage 3 unspecified 07/25/2020 No Yes D56.1 Beta thalassemia 07/25/2020 No Yes Inactive Problems Resolved Problems Electronic Signature(s) Signed: 07/25/2020 10:51:55 AM By: Worthy Keeler PA-C Entered By: Worthy Keeler on 07/25/2020 10:51:54 Guido, [name] ([id number]) -------------------------------------------------------------------------------- Progress Note Details Patient Name: Robert Dolly T. Date of Service: 07/25/2020 10:15 AM Medical Record Number: [id number] Patient Account Number: [id number] Date of Birth/Sex: 04-12-1945 (75 y.o. Male) Treating RN: Dolan Amen Primary Care Provider: Otilio Miu Other Clinician: Jeanine Luz Referring Provider: Renee Harder Treating Provider/Extender: Skipper Cliche in Treatment: 0 Subjective Chief Complaint Information obtained from Patient Left Knee Ulcer History of Present Illness (HPI) 07/25/2020 upon evaluation today patient presents for initial evaluation here in the clinic. I did have actually an extensive conversation with [facility] yesterday including the patient's physician who performed the surgery. Unfortunately this patient sustained a chainsaw injury on April 12 when he was working. He states he was cutting a limb when he saw the  limb was likely going to fall but did not think that it would come his way. When it did it actually hit his arm and then knocked his arm down and he as he was holding a chainsaw this went into his leg on the lateral portion of his left knee. Subsequently he ended up going to urgent care which led to the orthopedic office which led to him going into surgery for a multilayer closure. There is still  a small area that is open unfortunately that could not be completely closed and the lower portion of the wound laterally. The patient fortunately is not having a significant amount of pain he does have a little bit of hematoma here it is going to be cleaned out. He did have a temporary wound VAC in place though again this does not appear to likely be necessary at this point based on what I am seeing her at least this version of the [facility] is not going to be the best way to go. The patient does have diabetes mellitus type 2 and he has a hemoglobin A1c of 6.9 on most recent check. He is also currently on clindamycin and has an ABI of 1.18 on this side. His blood sugar this morning was 95. He keeps a close eye on this. Otherwise the patient does have a history of hypertension, chronic kidney disease stage III, and beta thalassemia. Of note when I spoke with the patient's surgeon yesterday at [facility] he stated that this is a patient whom they would not be following he wanted to turn care over to [place]. I am definitely okay with that I just wanted to make sure that since the patient was in a postop global that there was not anything they still wanted to monitor but again the care is pretty much been turned over to [place] after that conversation which is why did work to get the patient in today so that we can give appropriate home health orders for the patient as there did not appear to be any direction that was being covered as far as providing direct orders to home health aide which is what ever the hospital case  manager and recommended according to what the physician told me. Nonetheless I think that we can probably get some better orders back to home health and get them out to see the patient of performing the appropriate dressing changes. Patient History Information obtained from Patient. Allergies No Known Allergies Social History Never smoker, Marital Status - Married, Alcohol Use - Never, Drug Use - No History, Caffeine Use - Moderate. Medical History Eyes Patient has history of Cataracts - surgery Hematologic/Lymphatic Patient has history of Anemia Cardiovascular Patient has history of Coronary Artery Disease, Hypertension Endocrine Patient has history of Type II Diabetes Genitourinary Patient has history of End Stage Renal Disease - CKD-STAGE 3 HX Patient is treated with Controlled Diet, Oral Agents. Blood sugar results noted at the following times: Breakfast - 95. Hospitalization/Surgery History - open heart surgery. - R knee arthroscopy. Medical And Surgical History Notes Endocrine A1C 6.9 done 2/22 Review of Systems (ROS) Constitutional Symptoms (General Health) Denies complaints or symptoms of Fatigue, Fever, Chills, Marked Weight Change. Eyes Denies complaints or symptoms of Dry Eyes, Vision Changes, Glasses / Contacts. Ear/Nose/Mouth/Throat Denies complaints or symptoms of Difficult clearing ears, Sinusitis. Hematologic/Lymphatic [name], [name] ([id number]) HX thrombocytopenia beta thaassemia minor hx Respiratory Denies complaints or symptoms of Chronic or frequent coughs, Shortness of Breath. Cardiovascular OPEN HEART SURGERY 2017 Gastrointestinal Denies complaints or symptoms of Frequent diarrhea, Nausea, Vomiting. Immunological Denies complaints or symptoms of Hives, Itching. Integumentary (Skin) Denies complaints or symptoms of Wounds, Bleeding or bruising tendency, Breakdown, Swelling. Musculoskeletal Denies complaints or symptoms of Muscle Pain, Muscle  Weakness. Neurologic Denies complaints or symptoms of Numbness/parasthesias, Focal/Weakness. Psychiatric Denies complaints or symptoms of Anxiety, Claustrophobia. Objective Constitutional sitting or standing blood pressure is within target range for patient.. pulse regular and within target range for patient.Marland Kitchen respirations  regular, non- labored and within target range for patient.Marland Kitchen temperature within target range for patient.. Well-nourished and well-hydrated in no acute distress. Vitals Time Taken: 10:04 AM, Height: 70 in, Source: Stated, Weight: 190 lbs, Source: Stated, BMI: 27.3, Temperature: 97.6 F, Pulse: 65 bpm, Respiratory Rate: 16 breaths/min, Blood Pressure: 128/64 mmHg. Eyes conjunctiva clear no eyelid edema noted. pupils equal round and reactive to light and accommodation. Ears, Nose, Mouth, and Throat no gross abnormality of ear auricles or external auditory canals. normal hearing noted during conversation. mucus membranes moist. Respiratory normal breathing without difficulty. Cardiovascular 2+ dorsalis pedis/posterior tibialis pulses. no clubbing, cyanosis, significant edema, Musculoskeletal normal gait and posture. Psychiatric this patient is able to make decisions and demonstrates good insight into disease process. Alert and Oriented x 3. pleasant and cooperative. General Notes: Upon inspection patient's wound bed actually showed signs of a fairly significant laceration fortunately that does not appear to be showing signs of infection and overall has for the most part doing quite well. With that being said I do believe that the patient is going to require some treatment in regard to the inferior/lateral portion of the laceration site where to be honest he does have some depth to the wound here as well as some hematoma that I was able to mechanically debride away with saline and gauze as well as a Q-tip today. He has some undermining here but again I do not know that he  necessarily requires a wound VAC currently. Working to see how things go although that may be a consideration depending on how this progresses. Integumentary (Hair, Skin) Wound #1 status is Open. Original cause of wound was Trauma. The date acquired was: 07/18/2020. The wound is located on the Left,Lateral Knee. The wound measures 10.5cm length x 5.5cm width x 0.2cm depth; 45.357cm^2 area and 9.071cm^3 volume. There is Fat Layer (Subcutaneous Tissue) exposed. There is no tunneling or undermining noted. There is a large amount of sanguinous drainage noted. There is medium (34-66%) red, pink granulation within the wound bed. There is a medium (34-66%) amount of necrotic tissue within the wound bed including Eschar and Adherent Slough. 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 Home Health: [facility] for wound care. May utilize formulary equivalent dressing for wound treatment orders unless otherwise specified. Home Health Nurse may visit PRN to address patient s wound care needs. Scheduled days for dressing changes to be completed; exception, patient has scheduled wound care visit that day. - Monday, Wednesday, Friday- dressings changed in office on scheduled appt day Bathing/ Shower/ Hygiene: Clean wound with Normal Saline or wound cleanser. No tub bath. Negative Pressure Wound Therapy: Discontinue NPWT. WOUND #1: - Knee Wound Laterality: Left, Lateral Cleanser: Normal Saline 3 x  Per Week/30 Days Discharge Instructions: Wash your hands with soap and water. Remove old dressing, discard into plastic bag and place into trash. Cleanse the wound with Normal Saline prior to applying a clean dressing using gauze sponges, not tissues or cotton balls. Do not scrub or use excessive force. Pat dry using gauze sponges, not tissue or cotton balls. Primary Dressing:  Iodoform 1/4x 5 (in/yd) 3 x Per Week/30 Days Discharge Instructions: Apply Iodoform Packing Strip in 12-1 o'clock undermining (extras sent with patient) Primary Dressing: Silvercel Small 2x2 (in/in) 3 x Per Week/30 Days Discharge Instructions: Apply Silvercel Small 2x2 (in/in) on wound bed Secondary Dressing: ABD Pad 5x9 (in/in) 3 x Per Week/30 Days Discharge Instructions: Cover with ABD pad Secondary Dressing: Kerlix 4.5 x 4.1 (in/yd) 3 x Per Week/30 Days Discharge Instructions: Apply Kerlix 4.5 x 4.1 (in/yd) as instructed Secured With: 27M ACE Elastic Bandage With VELCRO Brand Closure, 4 (in) 3 x Per Week/30 Days Discharge Instructions: Secure dressing 1. Would recommend currently that we go ahead and initiate treatment with the patient utilizing iodoform gauze packed into the area of undermining. I think we just need to not really overly packed but just get the gauze down to this area in order to provide a wicking for this as there was some fluid trapped which could cause trouble if this continues to be the case. Fortunately I was able to evacuate that today. 2. I am also can recommend that we use silver cell over the small opening in the distal/lateral portion of the laceration site. We can also use some of this if needed over the incision site although that seems pretty dry and seems to be doing quite well. 3. We will cover with an ABD pad and roll gauze then secure with an Ace wrap. We will see patient back for reevaluation in 1 week here in the clinic. If anything worsens or changes patient will contact our office for  additional recommendations. We may have to evaluate and see if the sutures need to be removed at next week's visit that will be 2 weeks or whether it should be left a little bit longer typically 2 weeks is the maximum that the sutures really perform any significant function in my opinion. Electronic Signature(s) Signed: 07/25/2020 6:18:20 PM By: Worthy Keeler PA-C Entered By: Worthy Keeler on 07/25/2020 18:18:20 [name], [name] ([id number]) -------------------------------------------------------------------------------- ROS/PFSH Details Patient Name: [name]. Date of Service: 07/25/2020 10:15 AM Medical Record Number: [id number] Patient Account Number: [id number] Date of Birth/Sex: Dec 08, 1945 (75 y.o. Male) Treating RN: Donnamarie Poag Primary Care Provider: Otilio Miu Other Clinician: Jeanine Luz Referring Provider: Renee Harder Treating Provider/Extender: Skipper Cliche in Treatment: 0 Information Obtained From Patient Constitutional Symptoms (General Health) Complaints and Symptoms: Negative for: Fatigue; Fever; Chills; Marked Weight Change Eyes Complaints and Symptoms: Negative for: Dry Eyes; Vision Changes; Glasses / Contacts Medical History: Positive for: Cataracts - surgery Ear/Nose/Mouth/Throat Complaints and Symptoms: Negative for: Difficult clearing ears; Sinusitis Respiratory Complaints and Symptoms: Negative for: Chronic or frequent coughs; Shortness of Breath Gastrointestinal Complaints and Symptoms: Negative for: Frequent diarrhea; Nausea; Vomiting Immunological Complaints and Symptoms: Negative for: Hives; Itching Integumentary (Skin) Complaints and Symptoms: Negative for: Wounds; Bleeding or bruising tendency; Breakdown; Swelling Musculoskeletal Complaints and Symptoms: Negative for: Muscle Pain; Muscle Weakness Neurologic Complaints and Symptoms: Negative for: Numbness/parasthesias; Focal/Weakness Psychiatric Complaints and  Symptoms: Negative for: Anxiety; Claustrophobia Hematologic/Lymphatic [name], [name] ([id number]) Complaints and Symptoms: Review of System Notes: HX thrombocytopenia beta thaassemia minor hx Medical History: Positive for: Anemia Cardiovascular Complaints and Symptoms: Review of System Notes: OPEN HEART SURGERY 2017 Medical History: Positive for: Coronary Artery Disease; Hypertension Endocrine Medical History: Positive for: Type II Diabetes Past Medical History Notes: A1C 6.9 done 2/22 Time with diabetes: 1995 Treated with: Oral agents, Diet Blood sugar testing results: Breakfast: 95 Genitourinary Medical History: Positive for: End  Stage Renal Disease - CKD-STAGE 3 HX Oncologic HBO Extended History Items Eyes: Cataracts Immunizations Pneumococcal Vaccine: Received Pneumococcal Vaccination: Yes Implantable Devices None Hospitalization / Surgery History Type of Hospitalization/Surgery open heart surgery R knee arthroscopy Family and Social History Never smoker; Marital Status - Married; Alcohol Use: Never; Drug Use: No History; Caffeine Use: Moderate; Financial Concerns: No; Food, Clothing or Shelter Needs: No; Support System Lacking: No; Transportation Concerns: No Electronic Signature(s) Signed: 07/25/2020 4:18:00 PM By: Donnamarie Poag Signed: 07/26/2020 12:58:56 PM By: Worthy Keeler PA-C Entered By: Donnamarie Poag on 07/25/2020 10:30:50 [name], [name] ([id number]) -------------------------------------------------------------------------------- [place] Details Patient Name: [name]. Date of Service: 07/25/2020 Medical Record Number: [id number] Patient Account Number: [id number] Date of Birth/Sex: [birth year] (75 y.o. 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 E11.622 Type 2 diabetes mellitus with other skin ulcer I10 Essential (primary) hypertension N18.30 Chronic kidney disease, stage 3 unspecified D56.1 Beta thalassemia Facility Procedures CPT4 Code: 67672094 Description: 99214 - WOUND CARE VISIT-LEV 4 EST PT Modifier: Quantity: 1 Physician Procedures CPT4 Code: 7096283 Description: 99214 - WC PHYS LEVEL 4 - EST PT Modifier: Quantity: 1 CPT4 Code: Description: ICD-10 Diagnosis Description T81.31XA Disruption of external operation (surgical) wound, not elsewhere classifie L97.802 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

## 2020-07-27 ENCOUNTER — Ambulatory Visit: Payer: Medicare Other | Admitting: Physician Assistant

## 2020-07-27 DIAGNOSIS — D1632 Benign neoplasm of short bones of left lower limb: Secondary | ICD-10-CM | POA: Diagnosis not present

## 2020-07-27 DIAGNOSIS — N1832 Chronic kidney disease, stage 3b: Secondary | ICD-10-CM | POA: Diagnosis not present

## 2020-07-27 DIAGNOSIS — E1122 Type 2 diabetes mellitus with diabetic chronic kidney disease: Secondary | ICD-10-CM | POA: Diagnosis not present

## 2020-07-27 DIAGNOSIS — W293XXD Contact with powered garden and outdoor hand tools and machinery, subsequent encounter: Secondary | ICD-10-CM | POA: Diagnosis not present

## 2020-07-27 DIAGNOSIS — Z951 Presence of aortocoronary bypass graft: Secondary | ICD-10-CM | POA: Diagnosis not present

## 2020-07-27 DIAGNOSIS — E785 Hyperlipidemia, unspecified: Secondary | ICD-10-CM | POA: Diagnosis not present

## 2020-07-27 DIAGNOSIS — I10 Essential (primary) hypertension: Secondary | ICD-10-CM | POA: Diagnosis not present

## 2020-07-27 DIAGNOSIS — Z4800 Encounter for change or removal of nonsurgical wound dressing: Secondary | ICD-10-CM | POA: Diagnosis not present

## 2020-07-27 DIAGNOSIS — S8002XD Contusion of left knee, subsequent encounter: Secondary | ICD-10-CM | POA: Diagnosis not present

## 2020-07-27 DIAGNOSIS — Z9181 History of falling: Secondary | ICD-10-CM | POA: Diagnosis not present

## 2020-07-27 DIAGNOSIS — Z7984 Long term (current) use of oral hypoglycemic drugs: Secondary | ICD-10-CM | POA: Diagnosis not present

## 2020-07-27 DIAGNOSIS — D62 Acute posthemorrhagic anemia: Secondary | ICD-10-CM | POA: Diagnosis not present

## 2020-07-27 DIAGNOSIS — I25810 Atherosclerosis of coronary artery bypass graft(s) without angina pectoris: Secondary | ICD-10-CM | POA: Diagnosis not present

## 2020-07-27 DIAGNOSIS — Z7982 Long term (current) use of aspirin: Secondary | ICD-10-CM | POA: Diagnosis not present

## 2020-07-27 DIAGNOSIS — S81012D Laceration without foreign body, left knee, subsequent encounter: Secondary | ICD-10-CM | POA: Diagnosis not present

## 2020-07-31 DIAGNOSIS — S81012D Laceration without foreign body, left knee, subsequent encounter: Secondary | ICD-10-CM | POA: Diagnosis not present

## 2020-07-31 DIAGNOSIS — N1832 Chronic kidney disease, stage 3b: Secondary | ICD-10-CM | POA: Diagnosis not present

## 2020-07-31 DIAGNOSIS — E1122 Type 2 diabetes mellitus with diabetic chronic kidney disease: Secondary | ICD-10-CM | POA: Diagnosis not present

## 2020-07-31 DIAGNOSIS — I25810 Atherosclerosis of coronary artery bypass graft(s) without angina pectoris: Secondary | ICD-10-CM | POA: Diagnosis not present

## 2020-07-31 DIAGNOSIS — S8002XD Contusion of left knee, subsequent encounter: Secondary | ICD-10-CM | POA: Diagnosis not present

## 2020-07-31 DIAGNOSIS — I10 Essential (primary) hypertension: Secondary | ICD-10-CM | POA: Diagnosis not present

## 2020-08-01 ENCOUNTER — Ambulatory Visit: Payer: Medicare Other | Admitting: Internal Medicine

## 2020-08-02 DIAGNOSIS — S8002XD Contusion of left knee, subsequent encounter: Secondary | ICD-10-CM | POA: Diagnosis not present

## 2020-08-02 DIAGNOSIS — E1122 Type 2 diabetes mellitus with diabetic chronic kidney disease: Secondary | ICD-10-CM | POA: Diagnosis not present

## 2020-08-02 DIAGNOSIS — N1832 Chronic kidney disease, stage 3b: Secondary | ICD-10-CM | POA: Diagnosis not present

## 2020-08-02 DIAGNOSIS — I10 Essential (primary) hypertension: Secondary | ICD-10-CM | POA: Diagnosis not present

## 2020-08-02 DIAGNOSIS — I25810 Atherosclerosis of coronary artery bypass graft(s) without angina pectoris: Secondary | ICD-10-CM | POA: Diagnosis not present

## 2020-08-02 DIAGNOSIS — S81012D Laceration without foreign body, left knee, subsequent encounter: Secondary | ICD-10-CM | POA: Diagnosis not present

## 2020-08-05 DIAGNOSIS — I25810 Atherosclerosis of coronary artery bypass graft(s) without angina pectoris: Secondary | ICD-10-CM | POA: Diagnosis not present

## 2020-08-05 DIAGNOSIS — S81012D Laceration without foreign body, left knee, subsequent encounter: Secondary | ICD-10-CM | POA: Diagnosis not present

## 2020-08-05 DIAGNOSIS — S8002XD Contusion of left knee, subsequent encounter: Secondary | ICD-10-CM | POA: Diagnosis not present

## 2020-08-05 DIAGNOSIS — N1832 Chronic kidney disease, stage 3b: Secondary | ICD-10-CM | POA: Diagnosis not present

## 2020-08-05 DIAGNOSIS — I10 Essential (primary) hypertension: Secondary | ICD-10-CM | POA: Diagnosis not present

## 2020-08-05 DIAGNOSIS — E1122 Type 2 diabetes mellitus with diabetic chronic kidney disease: Secondary | ICD-10-CM | POA: Diagnosis not present

## 2020-08-08 ENCOUNTER — Encounter: Payer: Medicare Other | Attending: Physician Assistant | Admitting: Physician Assistant

## 2020-08-08 ENCOUNTER — Other Ambulatory Visit: Payer: Self-pay

## 2020-08-08 DIAGNOSIS — I120 Hypertensive chronic kidney disease with stage 5 chronic kidney disease or end stage renal disease: Secondary | ICD-10-CM | POA: Insufficient documentation

## 2020-08-08 DIAGNOSIS — E1122 Type 2 diabetes mellitus with diabetic chronic kidney disease: Secondary | ICD-10-CM | POA: Diagnosis not present

## 2020-08-08 DIAGNOSIS — L97802 Non-pressure chronic ulcer of other part of unspecified lower leg with fat layer exposed: Secondary | ICD-10-CM | POA: Diagnosis not present

## 2020-08-08 DIAGNOSIS — T8131XA Disruption of external operation (surgical) wound, not elsewhere classified, initial encounter: Secondary | ICD-10-CM | POA: Diagnosis not present

## 2020-08-08 DIAGNOSIS — L97822 Non-pressure chronic ulcer of other part of left lower leg with fat layer exposed: Secondary | ICD-10-CM | POA: Diagnosis not present

## 2020-08-08 DIAGNOSIS — D561 Beta thalassemia: Secondary | ICD-10-CM | POA: Insufficient documentation

## 2020-08-08 DIAGNOSIS — Y838 Other surgical procedures as the cause of abnormal reaction of the patient, or of later complication, without mention of misadventure at the time of the procedure: Secondary | ICD-10-CM | POA: Diagnosis not present

## 2020-08-08 DIAGNOSIS — E11622 Type 2 diabetes mellitus with other skin ulcer: Secondary | ICD-10-CM | POA: Diagnosis not present

## 2020-08-08 DIAGNOSIS — N186 End stage renal disease: Secondary | ICD-10-CM | POA: Insufficient documentation

## 2020-08-08 NOTE — Progress Notes (Addendum)
[name] ([id number]) Visit Report for 08/08/2020 Chief Complaint Document Details Patient Name: [name], [name]. Date of Service: 08/08/2020 10:15 AM Medical Record Number: [id number] Patient Account Number: [id number] Date of Birth/Sex: 1945-09-25 (75 y.o. M) Treating RN: Dolan Amen Primary Care Provider: Otilio Miu Other Clinician: Jeanine Luz Referring Provider: Otilio Miu Treating Provider/Extender: Skipper Cliche in Treatment: 2 Information Obtained from: Patient Chief Complaint Left Knee Ulcer Electronic Signature(s) Signed: 08/08/2020 10:21:52 AM By: Worthy Keeler PA-C Entered By: Worthy Keeler on 08/08/2020 10:21:51 [name] ([id number]) -------------------------------------------------------------------------------- HPI Details Patient Name: [name]. Date of Service: 08/08/2020 10:15 AM Medical Record Number: [id number] Patient Account Number: [id number] Date of Birth/Sex: [birth year] (75 y.o. M) Treating RN: Dolan Amen Primary Care Provider: Otilio Miu Other Clinician: Jeanine Luz Referring Provider: Otilio Miu Treating Provider/Extender: Jeri Cos Weeks in Treatment: 2 History of Present Illness HPI Description: 07/25/2020 upon evaluation today patient presents for initial evaluation here in the clinic. I did have actually an extensive conversation with [facility] yesterday including the patient's physician who performed the surgery. Unfortunately this patient sustained a chainsaw injury on April 12 when he was working. He states he was cutting a limb when he saw the limb was likely going to fall but did not think that it would come his way. When it did it actually hit his arm and then knocked his arm down and he as he was holding a chainsaw this went into his leg on the lateral portion of his left knee. Subsequently he ended up going to urgent care which led to the orthopedic office which led to him going  into surgery for a multilayer closure. There is still a small area that is open unfortunately that could not be completely closed and the lower portion of the wound laterally. The patient fortunately is not having a significant amount of pain he does have a little bit of hematoma here it is going to be cleaned out. He did have a temporary wound VAC in place though again this does not appear to likely be necessary at this point based on what I am seeing her at least this version of the [facility] is not going to be the best way to go. The patient does have diabetes mellitus type 2 and he has a hemoglobin A1c of 6.9 on most recent check. He is also currently on clindamycin and has an ABI of 1.18 on this side. His blood sugar this morning was 95. He keeps a close eye on this. Otherwise the patient does have a history of hypertension, chronic kidney disease stage III, and beta thalassemia. Of note when I spoke with the patient's surgeon yesterday at [facility] he stated that this is a patient whom they would not be following he wanted to turn care over to [place]. I am definitely okay with that I just wanted to make sure that since the patient was in a postop global that there was not anything they still wanted to monitor but again the care is pretty much been turned over to [place] after that conversation which is why did work to get the patient in today so that we can give appropriate home health orders for the patient as there did not appear to be any direction that was being covered as far as providing direct orders to home health aide which is what ever the hospital case manager and recommended according to what the physician told me. Nonetheless I think that we can probably  get some better orders back to home health and get them out to see the patient of performing the appropriate dressing changes. 08/08/2020 upon evaluation today patient appears to be doing excellent in regard to his knee ulcer. I think this is doing  quite well although I think there may be over packing into the area of undermining currently. We can address that at this point. Otherwise the patient seems to be doing quite well. Electronic Signature(s) Signed: 08/08/2020 11:17:37 AM By: Worthy Keeler PA-C Entered By: Worthy Keeler on 08/08/2020 11:17:36 [name], [name] ([id number]) -------------------------------------------------------------------------------- Physical Exam Details Patient Name: [name]. Date of Service: 08/08/2020 10:15 AM Medical Record Number: [id number] Patient Account Number: [id number] Date of Birth/Sex: 06-30-1945 (75 y.o. M) Treating RN: Dolan Amen Primary Care Provider: Otilio Miu Other Clinician: Jeanine Luz Referring Provider: Otilio Miu Treating Provider/Extender: Jeri Cos Weeks in Treatment: 2 Constitutional Well-nourished and well-hydrated in no acute distress. Respiratory normal breathing without difficulty. Psychiatric this patient is able to make decisions and demonstrates good insight into disease process. Alert and Oriented x 3. pleasant and cooperative. Notes Patient's wound bed showed signs of good granulation epithelization at this point. There does not appear to be any evidence of infection I do think we need to go ahead and remove his sutures he does have 12 in place I did remove all 12 today without complication there was really no significant bleeding. Overall I am extremely pleased with where we stand at this point. Electronic Signature(s) Signed: 08/08/2020 11:17:59 AM By: Worthy Keeler PA-C Entered By: Worthy Keeler on 08/08/2020 11:17:59 [name], [name] ([id number]) -------------------------------------------------------------------------------- Physician Orders Details Patient Name: [name]. Date of Service: 08/08/2020 10:15 AM Medical Record Number: [id number] Patient Account Number: [id number] Date of Birth/Sex: Jan 03, 1946 (75 y.o.  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 [facility] for wound care. May utilize formulary equivalent dressing for wound treatment orders unless otherwise specified. Home Health Nurse may visit PRN to address patientos wound care needs. o Scheduled days for dressing changes to be completed; exception, patient has scheduled wound care visit that day. - Tuesday, Thursday, Saturday-dressings changed in office on scheduled appt day [facility] does NOT need to come on those days Bathing/ Shower/ Hygiene o Clean wound with Normal Saline or wound cleanser. o No tub bath. Additional Orders / Instructions o Other: - 12 sutures removed in office today Wound Treatment Wound #1 - Knee Wound Laterality: Left, Lateral Cleanser: Normal Saline 3 x Per Week/30 Days Discharge Instructions: Wash your hands with soap and water. Remove old dressing, discard into plastic bag and place into trash. Cleanse the wound with Normal Saline prior to applying a clean dressing using gauze sponges, not tissues or cotton balls. Do not scrub or use excessive force. Pat dry using gauze sponges, not tissue or cotton balls. Primary Dressing: Iodoform gauze packing strip 1/2 (in) 3 x Per Week/30 Days  Discharge  Instructions: Slide 4 cm length Iodoform Packing Strip lightly in undermining leaving a "tail" DO NOT PACK (extras sent with patient) Primary Dressing: Silvercel Small 2x2 (in/in) 3 x Per Week/30 Days Discharge Instructions: Apply Silvercel Small 2x2 (in/in) on wound bed Secondary Dressing: ABD Pad 5x9 (in/in) 3 x Per Week/30 Days Discharge Instructions: Cover with ABD pad Secondary Dressing: Kerlix 4.5 x 4.1 (in/yd) 3 x Per Week/30 Days Discharge Instructions: Apply Kerlix 4.5 x 4.1 (in/yd) as instructed Secured With: 69M ACE Elastic Bandage With VELCRO Brand Closure, 4 (in) 3 x Per Week/30 Days Discharge Instructions: Secure dressing Electronic Signature(s) Signed: 08/08/2020 4:05:56 PM By: Georges Mouse, Minus Breeding RN [name], [name] ([id number]) Signed: 08/08/2020 4:59:24 PM By: Worthy Keeler PA-C Entered By: Georges Mouse, Minus Breeding on 08/08/2020 11:16:37 [name], [name] ([id number]) -------------------------------------------------------------------------------- Problem List Details Patient Name: Robert Dolly T. Date of Service: 08/08/2020 10:15 AM Medical Record Number: [id number] Patient Account Number: [id number] Date of Birth/Sex: November 08, 1945 (75 y.o. 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 with fat 07/25/2020 No Yes layer exposed E11.622 Type 2 diabetes mellitus with other skin ulcer 07/25/2020 No Yes I10 Essential (primary) hypertension 07/25/2020 No Yes N18.30 Chronic kidney disease, stage 3 unspecified 07/25/2020 No Yes D56.1 Beta thalassemia  07/25/2020 No Yes Inactive Problems Resolved Problems Electronic Signature(s) Signed: 08/08/2020 10:21:46 AM By: Worthy Keeler PA-C Entered By: Worthy Keeler on 08/08/2020 10:21:46 [name], [name] ([id number]) -------------------------------------------------------------------------------- Progress Note Details Patient Name: Robert Dolly T. Date of Service: 08/08/2020 10:15 AM Medical Record Number: [id number] Patient Account Number: [id number] Date of Birth/Sex: July 13, 1945 (75 y.o. M) Treating RN: Dolan Amen Primary Care Provider: Otilio Miu Other Clinician: Jeanine Luz Referring Provider: Otilio Miu Treating Provider/Extender: Skipper Cliche in Treatment: 2 Subjective Chief Complaint Information obtained from Patient Left Knee Ulcer History of Present Illness (HPI) 07/25/2020 upon evaluation today patient presents for initial evaluation here in the clinic. I did have actually an extensive conversation with [facility] yesterday including the patient's physician who performed the surgery. Unfortunately this patient sustained a chainsaw injury on April 12 when he was working. He states he was cutting a limb when he saw the limb was likely going to fall but did not think that it would come his way. When it did it actually hit his arm and then knocked his arm down and he as he was holding a chainsaw this went into his leg on the lateral portion of his left knee. Subsequently he ended up going to urgent care which led to the orthopedic office which led to him going into surgery for a multilayer closure. There is still a small area that is open unfortunately that could not be completely closed and the lower portion of the wound laterally. The patient fortunately is not having a significant amount of pain he does have a little bit of hematoma here it is going to be cleaned out. He did have a temporary wound VAC in place though again this does not appear to likely be  necessary at this point based on what I am seeing her at least this version of the [facility] is not going to be the best way to  go. The patient does have diabetes mellitus type 2 and he has a hemoglobin A1c of 6.9 on most recent check. He is also currently on clindamycin and has an ABI of 1.18 on this side. His blood sugar this morning was 95. He keeps a close eye on this. Otherwise the patient does have a history of hypertension, chronic kidney disease stage III, and beta thalassemia. Of note when I spoke with the patient's surgeon yesterday at [facility] he stated that this is a patient whom they would not be following he wanted to turn care over to [place]. I am definitely okay with that I just wanted to make sure that since the patient was in a postop global that there was not anything they still wanted to monitor but again the care is pretty much been turned over to [place] after that conversation which is why did work to get the patient in today so that we can give appropriate home health orders for the patient as there did not appear to be any direction that was being covered as far as providing direct orders to home health aide which is what ever the hospital case manager and recommended according to what the physician told me. Nonetheless I think that we can probably get some better orders back to home health and get them out to see the patient of performing the appropriate dressing changes. 08/08/2020 upon evaluation today patient appears to be doing excellent in regard to his knee ulcer. I think this is doing quite well although I think there may be over packing into the area of undermining currently. We can address that at this point. Otherwise the patient seems to be doing quite well. Objective Constitutional Well-nourished and well-hydrated in no acute distress. Vitals Time Taken: 10:38 AM, Height: 70 in, Weight: 190 lbs, BMI: 27.3, Temperature: 98.3 F, Pulse: 70 bpm, Respiratory Rate: 18  breaths/min, Blood Pressure: 118/66 mmHg, Capillary Blood Glucose: 75 mg/dl. General Notes: CBG per patient Respiratory normal breathing without difficulty. Psychiatric this patient is able to make decisions and demonstrates good insight into disease process. Alert and Oriented x 3. pleasant and cooperative. General Notes: Patient's wound bed showed signs of good granulation epithelization at this point. There does not appear to be any evidence of infection I do think we need to go ahead and remove his sutures he does have 12 in place I did remove all 12 today without complication there was really no significant bleeding. Overall I am extremely pleased with where we stand at this point. Integumentary (Hair, Skin) Wound #1 status is Open. Original cause of wound was Trauma. The date acquired was: 07/18/2020. The wound has been in treatment 2 weeks. The wound is located on the Left,Lateral Knee. The wound measures 5cm length x 3.5cm width x 0.2cm depth; 13.744cm^2 area and 2.749cm^3 volume. There is Fat Layer (Subcutaneous Tissue) exposed. There is no tunneling noted, however, there is undermining starting at 11:00 and AVI, KERSCHNER T. ([id number]) ending at 1:00 with a maximum distance of 2.5cm. There is a small amount of serosanguineous drainage noted. There is medium (34-66%) red, pink granulation within the wound bed. There is a medium (34-66%) amount of necrotic tissue within the wound bed including Eschar and Adherent Slough. 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 Home Health: [place] for wound care. May utilize formulary equivalent dressing for wound treatment orders unless otherwise specified. Home Health Nurse may visit PRN to address patient s wound care needs. Scheduled days for dressing changes to be completed; exception, patient has scheduled wound care visit that day. - Tuesday, Thursday, Saturday-dressings changed in office on scheduled appt day [facility] does NOT need to come on those days Bathing/ Shower/ Hygiene: Clean wound with Normal Saline or wound cleanser. No tub bath. Additional Orders / Instructions: Other: - 12 sutures removed in office today WOUND #1: - Knee Wound Laterality: Left, Lateral Cleanser: Normal Saline 3 x Per Week/30 Days Discharge Instructions: Wash your hands with soap and water. Remove old dressing, discard into plastic bag and place into trash. Cleanse the wound with Normal Saline prior to applying a clean dressing using gauze sponges, not tissues or cotton balls. Do not scrub or use excessive force. Pat dry using gauze sponges, not tissue or cotton balls. Primary Dressing: Iodoform gauze packing strip 1/2 (in) 3 x Per Week/30 Days Discharge Instructions: Slide 4 cm length Iodoform Packing Strip lightly in undermining leaving a "tail" DO NOT PACK (extras sent with patient) Primary Dressing: Silvercel Small 2x2 (in/in) 3 x Per Week/30 Days Discharge Instructions: Apply Silvercel Small 2x2 (in/in) on wound bed Secondary Dressing: ABD Pad 5x9 (in/in) 3 x Per Week/30 Days Discharge Instructions: Cover with ABD pad Secondary Dressing: Kerlix 4.5 x 4.1 (in/yd) 3 x Per Week/30 Days Discharge Instructions: Apply Kerlix 4.5 x 4.1 (in/yd) as instructed Secured With: 60M ACE Elastic Bandage With VELCRO Brand Closure, 4 (in) 3 x Per Week/30 Days Discharge Instructions: Secure dressing 1. I would recommend that we go ahead and continue with the iodoform packing gauze will do the half-inch strips and just have this in a  single layer slid into the end of the wound leaving a tail out. This should allow for the wound to granulate in. They should pull this back just a little bit once they reached the base of the wound. Subsequently there should only be at most a 4 cm piece used this will make sure that this is not over packed. 2. I am also can recommend we continue with alginate externally on the open areas. 3. I would also recommend the patient still be careful with this movement although I think he can definitely sit with his knee flexed without complication at this point. We will see patient back for reevaluation in 1 week here in the clinic. If anything worsens or changes patient will contact our office for additional recommendations. Electronic Signature(s) Signed: 08/08/2020 11:18:50 AM By: Worthy Keeler PA-C Entered By: Worthy Keeler on 08/08/2020 11:18:49 [name], [name] ([id number]) [name], [name] ([id number]) -------------------------------------------------------------------------------- [place] Details Patient Name: [name]. Date of Service: 08/08/2020 Medical Record Number: [id number] Patient Account Number: [id number] Date of Birth/Sex: December 01, 1945 (75 y.o. 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.1 Beta thalassemia Facility Procedures  CPT4 Code: [id number] Description: 99213 - WOUND CARE VISIT-LEV 3 EST PT Modifier: Quantity:  1 Physician Procedures CPT4 Code: [id number] Description: 32023 - WC PHYS LEVEL 3 - EST PT Modifier: Quantity: 1 CPT4 Code: Description: ICD-10 Diagnosis Description T81.31XA Disruption of external operation (surgical) wound, not elsewhere classifie X43.568 Non-pressure chronic ulcer of other part of unspecified lower leg with fat E11.622 Type 2 diabetes mellitus with other  skin ulcer I10 Essential (primary) hypertension Modifier: d, initial encounter layer exposed Quantity: Electronic Signature(s) Signed: 08/08/2020 11:19:04 AM By: Worthy Keeler PA-C Entered By: Worthy Keeler on 08/08/2020 11:19:03

## 2020-08-08 NOTE — Progress Notes (Addendum)
DERMOT, GREMILLION ([id number]) Visit Report for 08/08/2020 Arrival Information Details Patient Name: [name], [name]. Date of Service: 08/08/2020 10:15 AM Medical Record Number: [id number] Patient Account Number: [id number] Date of Birth/Sex: 1945/06/27 (75 y.o. M) Treating RN: Dolan Amen Primary Care Aleen Marston: Otilio Miu Other Clinician: Jeanine Luz Referring Tarren Sabree: Otilio Miu Treating Jameila Keeny/Extender: Skipper Cliche in Treatment: 2 Visit Information History Since Last Visit All ordered tests and consults were completed: No Patient Arrived: Ambulatory Added or deleted any medications: No Arrival Time: 10:26 Any new allergies or adverse reactions: No Accompanied By: self Had a fall or experienced change in No Transfer Assistance: None activities of daily living that may affect Patient Identification Verified: Yes risk of falls: Secondary Verification Process Completed: Yes Signs or symptoms of abuse/neglect since last visito No Patient Has Alerts: Yes Hospitalized since last visit: No Patient Alerts: Patient on Blood Thinner Implantable device outside of the clinic excluding No on ASPIRIN cellular tissue based products placed in the center DIABETIC since last visit: Has Dressing in Place as Prescribed: Yes Pain Present Now: No Electronic Signature(s) Signed: 08/08/2020 5:01:09 PM By: Jeanine Luz Entered By: Jeanine Luz on 08/08/2020 10:38:36 Knust, [name] ([id number]) -------------------------------------------------------------------------------- Clinic Level of Care Assessment Details Patient Name: Robert Dolly T. Date of Service: 08/08/2020 10:15 AM Medical Record Number: [id number] Patient Account Number: [id number] Date of Birth/Sex: March 28, 1946 (75 y.o. M) Treating RN: Dolan Amen Primary Care Nasario Czerniak: Otilio Miu Other Clinician: Jeanine Luz Referring Tiaja Hagan: Otilio Miu Treating Pistol Kessenich/Extender: Skipper Cliche in Treatment: 2 Clinic Level of Care Assessment Items TOOL 4 Quantity Score X - Use when only an EandM is performed on FOLLOW-UP visit 1 0 ASSESSMENTS - Nursing Assessment / Reassessment X - Reassessment of Co-morbidities (includes updates in patient status) 1 10 X- 1 5 Reassessment of Adherence to Treatment Plan ASSESSMENTS - Wound and Skin Assessment / Reassessment X - Simple Wound Assessment / Reassessment - one wound 1 5 []  - 0 Complex Wound Assessment / Reassessment - multiple wounds []  - 0 Dermatologic / Skin Assessment (not related to wound area) ASSESSMENTS - Focused Assessment []  - Circumferential Edema Measurements - multi extremities 0 []  - 0 Nutritional Assessment / Counseling / Intervention []  - 0 Lower Extremity Assessment (monofilament, tuning fork, pulses) []  - 0 Peripheral Arterial Disease Assessment (using hand held doppler) ASSESSMENTS - Ostomy and/or Continence Assessment and Care []  - Incontinence Assessment and Management 0 []  - 0 Ostomy Care Assessment and Management (repouching, etc.) PROCESS - Coordination of Care X - Simple Patient / Family Education for ongoing care 1 15 []  - 0 Complex (extensive) Patient / Family Education for ongoing care X- 1 10 Staff obtains Programmer, systems, Records, Test Results / Process Orders []  - 0 Staff telephones HHA, Nursing Homes / Clarify orders / etc []  - 0 Routine Transfer to another Facility (non-emergent condition) []  - 0 Routine Hospital Admission (non-emergent condition) []  - 0 New Admissions / Biomedical engineer / Ordering NPWT, Apligraf, etc. []  - 0 Emergency Hospital Admission (emergent condition) X- 1 10 Simple Discharge Coordination []  - 0 Complex (extensive) Discharge Coordination PROCESS - Special Needs []  - Pediatric / Minor Patient Management 0 []  - 0 Isolation Patient Management []  - 0 Hearing / Language / Visual special needs []  - 0 Assessment of Community assistance  (transportation, D/C planning, etc.) []  - 0 Additional assistance / Altered mentation []  - 0 Support Surface(s) Assessment (bed, cushion, seat, etc.) INTERVENTIONS - Wound Cleansing / Measurement Bennette, [name] ([id number])  X- 1 5 Simple Wound Cleansing - one wound []  - 0 Complex Wound Cleansing - multiple wounds X- 1 5 Wound Imaging (photographs - any number of wounds) []  - 0 Wound Tracing (instead of photographs) X- 1 5 Simple Wound Measurement - one wound []  - 0 Complex Wound Measurement - multiple wounds INTERVENTIONS - Wound Dressings []  - Small Wound Dressing one or multiple wounds 0 X- 1 15 Medium Wound Dressing one or multiple wounds []  - 0 Large Wound Dressing one or multiple wounds []  - 0 Application of Medications - topical []  - 0 Application of Medications - injection INTERVENTIONS - Miscellaneous []  - External ear exam 0 []  - 0 Specimen Collection (cultures, biopsies, blood, body fluids, etc.) []  - 0 Specimen(s) / Culture(s) sent or taken to Lab for analysis []  - 0 Patient Transfer (multiple staff / Civil Service fast streamer / Similar devices) X- 1 5 Simple Staple / Suture removal (25 or less) []  - 0 Complex Staple / Suture removal (26 or more) []  - 0 Hypo / Hyperglycemic Management (close monitor of Blood Glucose) []  - 0 Ankle / Brachial Index (ABI) - do not check if billed separately X- 1 5 Vital Signs Has the patient been seen at the hospital within the last three years: Yes Total Score: 95 Level Of Care: New/Established - Level 3 Electronic Signature(s) Signed: 08/08/2020 4:05:56 PM By: Georges Mouse, Minus Breeding RN Entered By: Georges Mouse, Kenia on 08/08/2020 11:15:34 Walbert, [name] ([id number]) -------------------------------------------------------------------------------- Encounter Discharge Information Details Patient Name: Robert Dolly T. Date of Service: 08/08/2020 10:15 AM Medical Record Number: [id number] Patient Account Number:  [id number] Date of Birth/Sex: 11/02/1945 (75 y.o. M) Treating RN: Dolan Amen Primary Care Broden Holt: Otilio Miu Other Clinician: Jeanine Luz Referring Darus Hershman: Otilio Miu Treating Auron Tadros/Extender: Skipper Cliche in Treatment: 2 Encounter Discharge Information Items Discharge Condition: Stable Ambulatory Status: Ambulatory Discharge Destination: Home Transportation: Private Auto Accompanied By: self Schedule Follow-up Appointment: Yes Clinical Summary of Care: Electronic Signature(s) Signed: 08/08/2020 5:01:09 PM By: Jeanine Luz Entered By: Jeanine Luz on 08/08/2020 11:27:49 Hinderliter, [name] ([id number]) -------------------------------------------------------------------------------- Multi Wound Chart Details Patient Name: [name]. Date of Service: 08/08/2020 10:15 AM Medical Record Number: [id number] Patient Account Number: [id number] Date of Birth/Sex: [birth year] (75 y.o. M) Treating RN: Dolan Amen Primary Care Kaiesha Tonner: Otilio Miu Other Clinician: Jeanine Luz Referring Kingston Shawgo: Otilio Miu Treating Rayburn Mundis/Extender: Jeri Cos Weeks in Treatment: 2 Vital Signs Height(in): 70 Capillary Blood Glucose 75 (mg/dl): Weight(lbs): 190 Pulse(bpm): 70 Body Mass Index(BMI): 27 Blood Pressure(mmHg): 118/66 Temperature(F): 98.3 Respiratory Rate(breaths/min): 18 Photos: [N/A:N/A] Wound Location: Left, Lateral Knee N/A N/A Wounding Event: Trauma N/A N/A Primary Etiology: Open Surgical Wound N/A N/A Comorbid History: Cataracts, Anemia, Coronary Artery N/A N/A Disease, Hypertension, Type II Diabetes, End Stage Renal Disease Date Acquired: 07/18/2020 N/A N/A Weeks of Treatment: 2 N/A N/A Wound Status: Open N/A N/A Measurements L x W x D (cm) 5x3.5x0.2 N/A N/A Area (cm) : 13.744 N/A N/A Volume (cm) : 2.749 N/A N/A % Reduction in Area: 69.70% N/A N/A % Reduction in Volume: 69.70% N/A N/A Starting Position 1 (o'clock):  11 Ending Position 1 (o'clock): 1 Maximum Distance 1 (cm): 2.5 Undermining: Yes N/A N/A Classification: Full Thickness Without Exposed N/A N/A Support Structures Exudate Amount: Small N/A N/A Exudate Type: Serosanguineous N/A N/A Exudate Color: red, brown N/A N/A Granulation Amount: Medium (34-66%) N/A N/A Granulation Quality: Red, Pink N/A N/A Necrotic Amount: Medium (34-66%) N/A N/A Necrotic Tissue: Eschar, Adherent Slough N/A N/A Exposed Structures: Fat  Layer (Subcutaneous Tissue): N/A N/A Yes Fascia: No Tendon: No Muscle: No Joint: No Bone: No Epithelialization: Medium (34-66%) N/A N/A Treatment Notes Electronic Signature(s) Signed: 08/08/2020 4:05:56 PM By: Georges Mouse, Minus Breeding RN Schembri, [name] ([id number]) Entered By: Georges Mouse, Minus Breeding on 08/08/2020 11:05:54 Mavis, [name] ([id number]) -------------------------------------------------------------------------------- [place] Details Patient Name: RANKIN, COOLMAN. Date of Service: 08/08/2020 10:15 AM Medical Record Number: [id number] Patient Account Number: [id number] Date of Birth/Sex: 03-Oct-1945 (75 y.o. M) Treating RN: Dolan Amen Primary Care Annina Piotrowski: Otilio Miu Other Clinician: Jeanine Luz Referring Jeanene Mena: Otilio Miu Treating Tenae Graziosi/Extender: Jeri Cos Weeks in Treatment: 2 Active Inactive Wound/Skin Impairment Nursing Diagnoses: Impaired tissue integrity Goals: Patient/caregiver will verbalize understanding of skin care regimen Date Initiated: 07/25/2020 Target Resolution Date: 07/25/2020 Goal Status: Active Ulcer/skin breakdown will have a volume reduction of 30% by week 4 Date Initiated: 07/25/2020 Target Resolution Date: 08/24/2020 Goal Status: Active Ulcer/skin breakdown will have a volume reduction of 50% by week 8 Date Initiated: 07/25/2020 Target Resolution Date: 09/24/2020 Goal Status: Active Ulcer/skin breakdown will have a volume reduction  of 80% by week 12 Date Initiated: 07/25/2020 Target Resolution Date: 10/24/2020 Goal Status: Active Ulcer/skin breakdown will heal within 14 weeks Date Initiated: 07/25/2020 Target Resolution Date: 11/24/2020 Goal Status: Active Interventions: Assess patient/caregiver ability to obtain necessary supplies Assess patient/caregiver ability to perform ulcer/skin care regimen upon admission and as needed Assess ulceration(s) every visit Provide education on ulcer and skin care Treatment Activities: Referred to DME Jozalyn Baglio for dressing supplies : 07/25/2020 Skin care regimen initiated : 07/25/2020 Notes: Electronic Signature(s) Signed: 08/08/2020 4:05:56 PM By: Georges Mouse, Minus Breeding RN Entered By: Georges Mouse, Minus Breeding on 08/08/2020 11:05:46 Huisman, [name] ([id number]) -------------------------------------------------------------------------------- Pain Assessment Details Patient Name: Robert Dolly T. Date of Service: 08/08/2020 10:15 AM Medical Record Number: [id number] Patient Account Number: [id number] Date of Birth/Sex: April 15, 1945 (75 y.o. M) Treating RN: Dolan Amen Primary Care November Sypher: Otilio Miu Other Clinician: Jeanine Luz Referring Anisha Starliper: Otilio Miu Treating Jehad Bisono/Extender: Jeri Cos Weeks in Treatment: 2 Active Problems Location of Pain Severity and Description of Pain Patient Has Paino No Site Locations Pain Management and Medication Current Pain Management: Electronic Signature(s) Signed: 08/08/2020 4:05:56 PM By: Georges Mouse, Minus Breeding RN Signed: 08/08/2020 5:01:09 PM By: Jeanine Luz Entered By: Jeanine Luz on 08/08/2020 10:39:14 Scobie, [name] ([id number]) -------------------------------------------------------------------------------- Patient/Caregiver Education Details Patient Name: [name]. Date of Service: 08/08/2020 10:15 AM Medical Record Number: [id number] Patient Account Number: [id number] Date of Birth/Gender:  [birth year] (75 y.o. M) Treating RN: Dolan Amen Primary Care Physician: Otilio Miu Other Clinician: Jeanine Luz Referring Physician: Otilio Miu Treating Physician/Extender: Skipper Cliche in Treatment: 2 Education Assessment Education Provided To: Patient Education Topics Provided Wound/Skin Impairment: Methods: Explain/Verbal Responses: State content correctly Electronic Signature(s) Signed: 08/08/2020 4:05:56 PM By: Georges Mouse, Minus Breeding RN Entered By: Georges Mouse, Minus Breeding on 08/08/2020 11:15:47 Flynt, [name] ([id number]) -------------------------------------------------------------------------------- Wound Assessment Details Patient Name: [name]. Date of Service: 08/08/2020 10:15 AM Medical Record Number: [id number] Patient Account Number: [id number] Date of Birth/Sex: 02/04/1946 (75 y.o. M) Treating RN: Dolan Amen Primary Care Burgundy Matuszak: Otilio Miu Other Clinician: Jeanine Luz Referring Rayni Nemitz: Otilio Miu Treating Remedios Mckone/Extender: Jeri Cos Weeks in Treatment: 2 Wound Status Wound Number: 1 Primary Open Surgical Wound Etiology: Wound Location: Left, Lateral Knee Wound Open Wounding Event: Trauma Status: Date Acquired: 07/18/2020 Notes: WOUND VAC IN PLACE UPON ADMISSION POST Weeks Of Treatment: 2 SURGERY Clustered Wound: No Comorbid Cataracts, Anemia, Coronary Artery Disease, Wound under treatment by Cordell Coke  outside of Wound Center History: Hypertension, Type II Diabetes, End Stage Renal Disease Photos Wound Measurements Length: (cm) 5 Width: (cm) 3.5 Depth: (cm) 0.2 Area: (cm) 13.744 Volume: (cm) 2.749 % Reduction in Area: 69.7% % Reduction in Volume: 69.7% Epithelialization: Medium (34-66%) Tunneling: No Undermining: Yes Starting Position (o'clock): 11 Ending Position (o'clock): 1 Maximum Distance: (cm) 2.5 Wound Description Classification: Full Thickness Without Exposed Support Structu Exudate  Amount: Small Exudate Type: Serosanguineous Exudate Color: red, brown res Foul Odor After Cleansing: No Slough/Fibrino Yes Wound Bed Granulation Amount: Medium (34-66%) Exposed Structure Granulation Quality: Red, Pink Fascia Exposed: No Necrotic Amount: Medium (34-66%) Fat Layer (Subcutaneous Tissue) Exposed: Yes Necrotic Quality: Eschar, Adherent Slough Tendon Exposed: No Muscle Exposed: No Joint Exposed: No Bone Exposed: No Treatment Notes Wound #1 (Knee) Wound Laterality: Left, Lateral Cleanser Herrle, [name] ([id number]) Normal Saline Discharge Instruction: Wash your hands with soap and water. Remove old dressing, discard into plastic bag and place into trash. Cleanse the wound with Normal Saline prior to applying a clean dressing using gauze sponges, not tissues or cotton balls. Do not scrub or use excessive force. Pat dry using gauze sponges, not tissue or cotton balls. Peri-Wound Care Topical Primary Dressing Iodoform gauze packing strip 1/2 (in) Discharge Instruction: Slide 4 cm length Iodoform Packing Strip lightly in undermining leaving a "tail" DO NOT PACK (extras sent with patient) Silvercel Small 2x2 (in/in) Discharge Instruction: Apply Silvercel Small 2x2 (in/in) on wound bed Secondary Dressing ABD Pad 5x9 (in/in) Discharge Instruction: Cover with ABD pad Kerlix 4.5 x 4.1 (in/yd) Discharge Instruction: Apply Kerlix 4.5 x 4.1 (in/yd) as instructed Secured With 27M ACE Elastic Bandage With VELCRO Brand Closure, 4 (in) Discharge Instruction: Secure dressing Compression Wrap Compression Stockings Add-Ons Electronic Signature(s) Signed: 08/08/2020 4:05:56 PM By: Georges Mouse, Minus Breeding RN Signed: 08/08/2020 5:01:09 PM By: Jeanine Luz Entered By: Jeanine Luz on 08/08/2020 10:40:47 Mazzuca, [name] ([id number]) -------------------------------------------------------------------------------- [place] Details Patient Name: Robert Dolly T. Date of  Service: 08/08/2020 10:15 AM Medical Record Number: [id number] Patient Account Number: [id number] Date of Birth/Sex: Feb 10, 1946 (75 y.o. M) Treating RN: Dolan Amen Primary Care Yunique Dearcos: Otilio Miu Other Clinician: Jeanine Luz Referring Obert Espindola: Otilio Miu Treating Terrika Zuver/Extender: Jeri Cos Weeks in Treatment: 2 Vital Signs Time Taken: 10:38 Temperature (F): 98.3 Height (in): 70 Pulse (bpm): 70 Weight (lbs): 190 Respiratory Rate (breaths/min): 18 Body Mass Index (BMI): 27.3 Blood Pressure (mmHg): 118/66 Capillary Blood Glucose (mg/dl): 75 Reference Range: 80 - 120 mg / dl Notes CBG per patient Electronic Signature(s) Signed: 08/08/2020 5:01:09 PM By: Jeanine Luz Entered By: Jeanine Luz on 08/08/2020 10:39:05

## 2020-08-10 DIAGNOSIS — E1122 Type 2 diabetes mellitus with diabetic chronic kidney disease: Secondary | ICD-10-CM | POA: Diagnosis not present

## 2020-08-10 DIAGNOSIS — I25810 Atherosclerosis of coronary artery bypass graft(s) without angina pectoris: Secondary | ICD-10-CM | POA: Diagnosis not present

## 2020-08-10 DIAGNOSIS — S81012D Laceration without foreign body, left knee, subsequent encounter: Secondary | ICD-10-CM | POA: Diagnosis not present

## 2020-08-10 DIAGNOSIS — N1832 Chronic kidney disease, stage 3b: Secondary | ICD-10-CM | POA: Diagnosis not present

## 2020-08-10 DIAGNOSIS — I10 Essential (primary) hypertension: Secondary | ICD-10-CM | POA: Diagnosis not present

## 2020-08-10 DIAGNOSIS — S8002XD Contusion of left knee, subsequent encounter: Secondary | ICD-10-CM | POA: Diagnosis not present

## 2020-08-12 DIAGNOSIS — I25810 Atherosclerosis of coronary artery bypass graft(s) without angina pectoris: Secondary | ICD-10-CM | POA: Diagnosis not present

## 2020-08-12 DIAGNOSIS — E1122 Type 2 diabetes mellitus with diabetic chronic kidney disease: Secondary | ICD-10-CM | POA: Diagnosis not present

## 2020-08-12 DIAGNOSIS — S81012D Laceration without foreign body, left knee, subsequent encounter: Secondary | ICD-10-CM | POA: Diagnosis not present

## 2020-08-12 DIAGNOSIS — S8002XD Contusion of left knee, subsequent encounter: Secondary | ICD-10-CM | POA: Diagnosis not present

## 2020-08-12 DIAGNOSIS — N1832 Chronic kidney disease, stage 3b: Secondary | ICD-10-CM | POA: Diagnosis not present

## 2020-08-12 DIAGNOSIS — I10 Essential (primary) hypertension: Secondary | ICD-10-CM | POA: Diagnosis not present

## 2020-08-14 ENCOUNTER — Other Ambulatory Visit: Payer: Self-pay

## 2020-08-14 ENCOUNTER — Encounter: Payer: Medicare Other | Admitting: Physician Assistant

## 2020-08-14 DIAGNOSIS — L97802 Non-pressure chronic ulcer of other part of unspecified lower leg with fat layer exposed: Secondary | ICD-10-CM | POA: Diagnosis not present

## 2020-08-14 DIAGNOSIS — T8131XA Disruption of external operation (surgical) wound, not elsewhere classified, initial encounter: Secondary | ICD-10-CM | POA: Diagnosis not present

## 2020-08-14 DIAGNOSIS — I120 Hypertensive chronic kidney disease with stage 5 chronic kidney disease or end stage renal disease: Secondary | ICD-10-CM | POA: Diagnosis not present

## 2020-08-14 DIAGNOSIS — L97822 Non-pressure chronic ulcer of other part of left lower leg with fat layer exposed: Secondary | ICD-10-CM | POA: Diagnosis not present

## 2020-08-14 DIAGNOSIS — E11622 Type 2 diabetes mellitus with other skin ulcer: Secondary | ICD-10-CM | POA: Diagnosis not present

## 2020-08-14 DIAGNOSIS — E1122 Type 2 diabetes mellitus with diabetic chronic kidney disease: Secondary | ICD-10-CM | POA: Diagnosis not present

## 2020-08-14 DIAGNOSIS — N186 End stage renal disease: Secondary | ICD-10-CM | POA: Diagnosis not present

## 2020-08-14 NOTE — Progress Notes (Addendum)
RESHAD, SAAB ([id number]) Visit Report for 08/14/2020 Arrival Information Details Patient Name: [name]. Date of Service: 08/14/2020 8:15 AM Medical Record Number: [id number] Patient Account Number: [id number] Date of Birth/Sex: 1945/07/11 (75 y.o. M) Treating RN: Donnamarie Poag Primary Care Chrsitopher Wik: Otilio Miu Other Clinician: Referring Stclair Szymborski: Otilio Miu Treating Brooklyn Jeff/Extender: Skipper Cliche in Treatment: 2 Visit Information History Since Last Visit Added or deleted any medications: No Patient Arrived: Ambulatory Had a fall or experienced change in No Arrival Time: 08:24 activities of daily living that may affect Accompanied By: self risk of falls: Transfer Assistance: None Hospitalized since last visit: No Patient Identification Verified: Yes Has Dressing in Place as Prescribed: Yes Secondary Verification Process Completed: Yes Pain Present Now: No Patient Has Alerts: Yes Patient Alerts: Patient on Blood Thinner on ASPIRIN DIABETIC Electronic Signature(s) Signed: 08/14/2020 10:31:37 AM By: Donnamarie Poag Entered By: Donnamarie Poag on 08/14/2020 08:24:51 [name], [name] ([id number]) -------------------------------------------------------------------------------- Clinic Level of Care Assessment Details Patient Name: Robert Dolly T. Date of Service: 08/14/2020 8:15 AM Medical Record Number: [id number] Patient Account Number: [id number] Date of Birth/Sex: May 25, 1945 (75 y.o. M) Treating RN: Donnamarie Poag Primary Care Annamay Laymon: Otilio Miu Other Clinician: Referring Gailen Venne: Otilio Miu Treating Juanice Warburton/Extender: Skipper Cliche in Treatment: 2 Clinic Level of Care Assessment Items TOOL 4 Quantity Score '[]'  - Use when only an EandM is performed on FOLLOW-UP visit 0 ASSESSMENTS - Nursing Assessment / Reassessment '[]'  - Reassessment of Co-morbidities (includes updates in patient status) 0 '[]'  - 0 Reassessment of Adherence to Treatment  Plan ASSESSMENTS - Wound and Skin Assessment / Reassessment X - Simple Wound Assessment / Reassessment - one wound 1 5 '[]'  - 0 Complex Wound Assessment / Reassessment - multiple wounds '[]'  - 0 Dermatologic / Skin Assessment (not related to wound area) ASSESSMENTS - Focused Assessment '[]'  - Circumferential Edema Measurements - multi extremities 0 '[]'  - 0 Nutritional Assessment / Counseling / Intervention '[]'  - 0 Lower Extremity Assessment (monofilament, tuning fork, pulses) '[]'  - 0 Peripheral Arterial Disease Assessment (using hand held doppler) ASSESSMENTS - Ostomy and/or Continence Assessment and Care '[]'  - Incontinence Assessment and Management 0 '[]'  - 0 Ostomy Care Assessment and Management (repouching, etc.) PROCESS - Coordination of Care X - Simple Patient / Family Education for ongoing care 1 15 '[]'  - 0 Complex (extensive) Patient / Family Education for ongoing care '[]'  - 0 Staff obtains Programmer, systems, Records, Test Results / Process Orders '[]'  - 0 Staff telephones HHA, Nursing Homes / Clarify orders / etc '[]'  - 0 Routine Transfer to another Facility (non-emergent condition) '[]'  - 0 Routine Hospital Admission (non-emergent condition) '[]'  - 0 New Admissions / Biomedical engineer / Ordering NPWT, Apligraf, etc. '[]'  - 0 Emergency Hospital Admission (emergent condition) X- 1 10 Simple Discharge Coordination '[]'  - 0 Complex (extensive) Discharge Coordination PROCESS - Special Needs '[]'  - Pediatric / Minor Patient Management 0 '[]'  - 0 Isolation Patient Management '[]'  - 0 Hearing / Language / Visual special needs '[]'  - 0 Assessment of Community assistance (transportation, D/C planning, etc.) '[]'  - 0 Additional assistance / Altered mentation '[]'  - 0 Support Surface(s) Assessment (bed, cushion, seat, etc.) INTERVENTIONS - Wound Cleansing / Measurement [name], [name] ([id number]) X- 1 5 Simple Wound Cleansing - one wound '[]'  - 0 Complex Wound Cleansing - multiple wounds '[]'  -  0 Wound Imaging (photographs - any number of wounds) '[]'  - 0 Wound Tracing (instead of photographs) X- 1 5 Simple Wound Measurement - one wound '[]'  - 0  Complex Wound Measurement - multiple wounds INTERVENTIONS - Wound Dressings X - Small Wound Dressing one or multiple wounds 1 10 '[]'  - 0 Medium Wound Dressing one or multiple wounds '[]'  - 0 Large Wound Dressing one or multiple wounds '[]'  - 0 Application of Medications - topical '[]'  - 0 Application of Medications - injection INTERVENTIONS - Miscellaneous '[]'  - External ear exam 0 '[]'  - 0 Specimen Collection (cultures, biopsies, blood, body fluids, etc.) '[]'  - 0 Specimen(s) / Culture(s) sent or taken to Lab for analysis '[]'  - 0 Patient Transfer (multiple staff / Civil Service fast streamer / Similar devices) '[]'  - 0 Simple Staple / Suture removal (25 or less) '[]'  - 0 Complex Staple / Suture removal (26 or more) '[]'  - 0 Hypo / Hyperglycemic Management (close monitor of Blood Glucose) '[]'  - 0 Ankle / Brachial Index (ABI) - do not check if billed separately X- 1 5 Vital Signs Has the patient been seen at the hospital within the last three years: Yes Total Score: 55 Level Of Care: New/Established - Level 2 Electronic Signature(s) Signed: 08/14/2020 10:31:37 AM By: Donnamarie Poag Entered By: Donnamarie Poag on 08/14/2020 08:56:52 [name], [name] ([id number]) -------------------------------------------------------------------------------- Encounter Discharge Information Details Patient Name: Robert Dolly T. Date of Service: 08/14/2020 8:15 AM Medical Record Number: [id number] Patient Account Number: [id number] Date of Birth/Sex: 12-14-1945 (75 y.o. M) Treating RN: Donnamarie Poag Primary Care Hannie Shoe: Otilio Miu Other Clinician: Referring Katerin Negrete: Otilio Miu Treating Faiza Bansal/Extender: Skipper Cliche in Treatment: 2 Encounter Discharge Information Items Discharge Condition: Stable Ambulatory Status: Ambulatory Discharge Destination:  Home Transportation: Private Auto Accompanied By: self Schedule Follow-up Appointment: Yes Clinical Summary of Care: Electronic Signature(s) Signed: 08/14/2020 10:31:37 AM By: Donnamarie Poag Entered By: Donnamarie Poag on 08/14/2020 09:06:49 Hazelrigg, [name] ([id number]) -------------------------------------------------------------------------------- Lower Extremity Assessment Details Patient Name: Robert Dolly T. Date of Service: 08/14/2020 8:15 AM Medical Record Number: [id number] Patient Account Number: [id number] Date of Birth/Sex: 1945-04-11 (75 y.o. M) Treating RN: Donnamarie Poag Primary Care Lydiah Pong: Otilio Miu Other Clinician: Referring Novia Lansberry: Otilio Miu Treating Eola Waldrep/Extender: Jeri Cos Weeks in Treatment: 2 Edema Assessment Assessed: [Left: Yes] [Right: No] [Left: Edema] [Right: :] Vascular Assessment Pulses: Dorsalis Pedis Palpable: [Left:Yes] Electronic Signature(s) Signed: 08/14/2020 10:31:37 AM By: Donnamarie Poag Entered By: Donnamarie Poag on 08/14/2020 08:29:13 Bazzano, [name] ([id number]) -------------------------------------------------------------------------------- Multi Wound Chart Details Patient Name: Robert Dolly T. Date of Service: 08/14/2020 8:15 AM Medical Record Number: [id number] Patient Account Number: [id number] Date of Birth/Sex: 06/11/1945 (75 y.o. M) Treating RN: Donnamarie Poag Primary Care Crystallee Werden: Otilio Miu Other Clinician: Referring Melessia Kaus: Otilio Miu Treating Joory Gough/Extender: Jeri Cos Weeks in Treatment: 2 Vital Signs Height(in): 70 Pulse(bpm): 60 Weight(lbs): 190 Blood Pressure(mmHg): 145/81 Body Mass Index(BMI): 27 Temperature(F): 97.9 Respiratory Rate(breaths/min): 18 Photos: [N/A:N/A] Wound Location: Left, Lateral Knee N/A N/A Wounding Event: Trauma N/A N/A Primary Etiology: Open Surgical Wound N/A N/A Comorbid History: Cataracts, Anemia, Coronary Artery N/A N/A Disease, Hypertension, Type II Diabetes,  End Stage Renal Disease Date Acquired: 07/18/2020 N/A N/A Weeks of Treatment: 2 N/A N/A Wound Status: Open N/A N/A Measurements L x W x D (cm) 4x2x0.2 N/A N/A Area (cm) : 6.283 N/A N/A Volume (cm) : 1.257 N/A N/A % Reduction in Area: 86.10% N/A N/A % Reduction in Volume: 86.10% N/A N/A Classification: Full Thickness Without Exposed N/A N/A Support Structures Exudate Amount: Small N/A N/A Exudate Type: Serosanguineous N/A N/A Exudate Color: red, brown N/A N/A Granulation Amount: Medium (34-66%) N/A N/A Granulation Quality: Red, Pink N/A N/A Necrotic Amount: Medium (  34-66%) N/A N/A Necrotic Tissue: Eschar, Adherent Slough N/A N/A Exposed Structures: Fat Layer (Subcutaneous Tissue): N/A N/A Yes Fascia: No Tendon: No Muscle: No Joint: No Bone: No Epithelialization: Medium (34-66%) N/A N/A Treatment Notes Electronic Signature(s) Signed: 08/14/2020 10:31:37 AM By: Donnamarie Poag Entered By: Donnamarie Poag on 08/14/2020 08:54:55 Leist, [name] ([id number]) -------------------------------------------------------------------------------- [place] Details Patient Name: [name]. Date of Service: 08/14/2020 8:15 AM Medical Record Number: [id number] Patient Account Number: [id number] Date of Birth/Sex: 03-11-1946 (75 y.o. M) Treating RN: Donnamarie Poag Primary Care Beverlyn Mcginness: Otilio Miu Other Clinician: Referring Demtrius Rounds: Otilio Miu Treating Kalyse Meharg/Extender: Jeri Cos Weeks in Treatment: 2 Active Inactive Wound/Skin Impairment Nursing Diagnoses: Impaired tissue integrity Goals: Patient/caregiver will verbalize understanding of skin care regimen Date Initiated: 07/25/2020 Date Inactivated: 08/14/2020 Target Resolution Date: 07/25/2020 Goal Status: Met Ulcer/skin breakdown will have a volume reduction of 30% by week 4 Date Initiated: 07/25/2020 Date Inactivated: 08/14/2020 Target Resolution Date: 08/24/2020 Goal Status: Met Ulcer/skin breakdown will  have a volume reduction of 50% by week 8 Date Initiated: 07/25/2020 Target Resolution Date: 09/24/2020 Goal Status: Active Ulcer/skin breakdown will have a volume reduction of 80% by week 12 Date Initiated: 07/25/2020 Target Resolution Date: 10/24/2020 Goal Status: Active Ulcer/skin breakdown will heal within 14 weeks Date Initiated: 07/25/2020 Target Resolution Date: 11/24/2020 Goal Status: Active Interventions: Assess patient/caregiver ability to obtain necessary supplies Assess patient/caregiver ability to perform ulcer/skin care regimen upon admission and as needed Assess ulceration(s) every visit Provide education on ulcer and skin care Treatment Activities: Referred to DME Thoren Hosang for dressing supplies : 07/25/2020 Skin care regimen initiated : 07/25/2020 Notes: Electronic Signature(s) Signed: 08/14/2020 10:31:37 AM By: Donnamarie Poag Entered By: Donnamarie Poag on 08/14/2020 08:54:45 Sylva, [name] ([id number]) -------------------------------------------------------------------------------- Pain Assessment Details Patient Name: Robert Dolly T. Date of Service: 08/14/2020 8:15 AM Medical Record Number: [id number] Patient Account Number: [id number] Date of Birth/Sex: 1946/01/09 (75 y.o. M) Treating RN: Donnamarie Poag Primary Care Takari Lundahl: Otilio Miu Other Clinician: Referring Khaniyah Bezek: Otilio Miu Treating Takota Cahalan/Extender: Jeri Cos Weeks in Treatment: 2 Active Problems Location of Pain Severity and Description of Pain Patient Has Paino No Site Locations Rate the pain. Current Pain Level: 0 Pain Management and Medication Current Pain Management: Electronic Signature(s) Signed: 08/14/2020 10:31:37 AM By: Donnamarie Poag Entered By: Donnamarie Poag on 08/14/2020 08:25:37 Dorer, [name] ([id number]) -------------------------------------------------------------------------------- Patient/Caregiver Education Details Patient Name: [name]. Date of Service:  08/14/2020 8:15 AM Medical Record Number: [id number] Patient Account Number: [id number] Date of Birth/Gender: [birth year] (75 y.o. M) Treating RN: Donnamarie Poag Primary Care Physician: Otilio Miu Other Clinician: Referring Physician: Otilio Miu Treating Physician/Extender: Skipper Cliche in Treatment: 2 Education Assessment Education Provided To: Patient Education Topics Provided Wound/Skin Impairment: Electronic Signature(s) Signed: 08/14/2020 10:31:37 AM By: Donnamarie Poag Entered By: Donnamarie Poag on 08/14/2020 08:55:22 Kindle, [name] ([id number]) -------------------------------------------------------------------------------- Wound Assessment Details Patient Name: [name]. Date of Service: 08/14/2020 8:15 AM Medical Record Number: [id number] Patient Account Number: [id number] Date of Birth/Sex: 11-11-1945 (75 y.o. M) Treating RN: Donnamarie Poag Primary Care Melchizedek Espinola: Otilio Miu Other Clinician: Referring Tania Perrott: Otilio Miu Treating Thamas Appleyard/Extender: Jeri Cos Weeks in Treatment: 2 Wound Status Wound Number: 1 Primary Open Surgical Wound Etiology: Wound Location: Left, Lateral Knee Wound Open Wounding Event: Trauma Status: Date Acquired: 07/18/2020 Notes: WOUND VAC IN PLACE UPON ADMISSION POST Weeks Of Treatment: 2 SURGERY Clustered Wound: No Comorbid Cataracts, Anemia, Coronary Artery Disease, Wound under treatment by Perline Awe outside of [place] History: Hypertension, Type II Diabetes, End  Stage Renal Disease Photos Wound Measurements Length: (cm) 4 Width: (cm) 2 Depth: (cm) 0.2 Area: (cm) 6.283 Volume: (cm) 1.257 % Reduction in Area: 86.1% % Reduction in Volume: 86.1% Epithelialization: Medium (34-66%) Wound Description Classification: Full Thickness Without Exposed Support Structures Exudate Amount: Small Exudate Type: Serosanguineous Exudate Color: red, brown Foul Odor After Cleansing: No Slough/Fibrino Yes Wound Bed Granulation  Amount: Medium (34-66%) Exposed Structure Granulation Quality: Red, Pink Fascia Exposed: No Necrotic Amount: Medium (34-66%) Fat Layer (Subcutaneous Tissue) Exposed: Yes Necrotic Quality: Eschar, Adherent Slough Tendon Exposed: No Muscle Exposed: No Joint Exposed: No Bone Exposed: No Treatment Notes Wound #1 (Knee) Wound Laterality: Left, Lateral Cleanser Normal Saline Discharge Instruction: Wash your hands with soap and water. Remove old dressing, discard into plastic bag and place into trash. Cleanse the wound with Normal Saline prior to applying a clean dressing using gauze sponges, not tissues or cotton balls. Do not [name], Robert T. ([id number]) scrub or use excessive force. Pat dry using gauze sponges, not tissue or cotton balls. Peri-Wound Care Topical Primary Dressing Iodoform 1/4x 5 (in/yd) Discharge Instruction: Apply Iodoform Packing Strip as instructed. Silvercel Small 2x2 (in/in) Discharge Instruction: Apply Silvercel Small 2x2 (in/in) on wound bed Secondary Dressing ABD Pad 5x9 (in/in) Discharge Instruction: Cover with ABD pad Kerlix 4.5 x 4.1 (in/yd) Discharge Instruction: Apply Kerlix 4.5 x 4.1 (in/yd) as instructed Secured With 75M ACE Elastic Bandage With VELCRO Brand Closure, 4 (in) Discharge Instruction: Secure dressing Compression Wrap Compression Stockings Add-Ons Electronic Signature(s) Signed: 08/14/2020 10:31:37 AM By: Donnamarie Poag Entered By: Donnamarie Poag on 08/14/2020 08:28:54 Balch, [name] ([id number]) -------------------------------------------------------------------------------- [place] Details Patient Name: Robert Dolly T. Date of Service: 08/14/2020 8:15 AM Medical Record Number: [id number] Patient Account Number: [id number] Date of Birth/Sex: 1945/05/01 (75 y.o. M) Treating RN: Donnamarie Poag Primary Care Corena Tilson: Otilio Miu Other Clinician: Referring Nawaal Alling: Otilio Miu Treating Araya Roel/Extender: Jeri Cos Weeks in  Treatment: 2 Vital Signs Time Taken: 08:25 Temperature (F): 97.9 Height (in): 70 Pulse (bpm): 60 Weight (lbs): 190 Respiratory Rate (breaths/min): 18 Body Mass Index (BMI): 27.3 Blood Pressure (mmHg): 145/81 Reference Range: 80 - 120 mg / dl Electronic Signature(s) Signed: 08/14/2020 10:31:37 AM By: Donnamarie Poag Entered ByDonnamarie Poag on 08/14/2020 08:25:29

## 2020-08-14 NOTE — Progress Notes (Addendum)
FREMAN, LAPAGE ([id number]) Visit Report for 08/14/2020 Chief Complaint Document Details Patient Name: [name], [name]. Date of Service: 08/14/2020 8:15 AM Medical Record Number: [id number] Patient Account Number: [id number] Date of Birth/Sex: [birth year] (75 y.o. M) Treating RN: Donnamarie Poag Primary Care Provider: Otilio Miu Other Clinician: Referring Provider: Otilio Miu Treating Provider/Extender: Jeri Cos Weeks in Treatment: 2 Information Obtained from: Patient Chief Complaint Left Knee Ulcer Electronic Signature(s) Signed: 08/14/2020 8:18:16 AM By: Worthy Keeler PA-C Entered By: Worthy Keeler on 08/14/2020 08:18:16 G[place], [name] ([id number]) -------------------------------------------------------------------------------- HPI Details Patient Name: [name]. Date of Service: 08/14/2020 8:15 AM Medical Record Number: [id number] Patient Account Number: [id number] Date of Birth/Sex: 1945/07/03 (75 y.o. M) Treating RN: Donnamarie Poag Primary Care Provider: Otilio Miu Other Clinician: Referring Provider: Otilio Miu Treating Provider/Extender: Jeri Cos Weeks in Treatment: 2 History of Present Illness HPI Description: 07/25/2020 upon evaluation today patient presents for initial evaluation here in the clinic. I did have actually an extensive conversation with [facility] yesterday including the patient's physician who performed the surgery. Unfortunately this patient sustained a chainsaw injury on April 12 when he was working. He states he was cutting a limb when he saw the limb was likely going to fall but did not think that it would come his way. When it did it actually hit his arm and then knocked his arm down and he as he was holding a chainsaw this went into his leg on the lateral portion of his left knee. Subsequently he ended up going to urgent care which led to the orthopedic office which led to him going into surgery for a multilayer closure. There  is still a small area that is open unfortunately that could not be completely closed and the lower portion of the wound laterally. The patient fortunately is not having a significant amount of pain he does have a little bit of hematoma here it is going to be cleaned out. He did have a temporary wound VAC in place though again this does not appear to likely be necessary at this point based on what I am seeing her at least this version of the [facility] is not going to be the best way to go. The patient does have diabetes mellitus type 2 and he has a hemoglobin A1c of 6.9 on most recent check. He is also currently on clindamycin and has an ABI of 1.18 on this side. His blood sugar this morning was 95. He keeps a close eye on this. Otherwise the patient does have a history of hypertension, chronic kidney disease stage III, and beta thalassemia. Of note when I spoke with the patient's surgeon yesterday at [facility] he stated that this is a patient whom they would not be following he wanted to turn care over to [place]. I am definitely okay with that I just wanted to make sure that since the patient was in a postop global that there was not anything they still wanted to monitor but again the care is pretty much been turned over to [place] after that conversation which is why did work to get the patient in today so that we can give appropriate home health orders for the patient as there did not appear to be any direction that was being covered as far as providing direct orders to home health aide which is what ever the hospital case manager and recommended according to what the physician told me. Nonetheless I think that we can probably get some better orders  back to home health and get them out to see the patient of performing the appropriate dressing changes. 08/08/2020 upon evaluation today patient appears to be doing excellent in regard to his knee ulcer. I think this is doing quite well although I think there may be  over packing into the area of undermining currently. We can address that at this point. Otherwise the patient seems to be doing quite well. 08/14/2020 upon evaluation today patient appears to be doing well with regard to his knee. Has been tolerating the dressing changes without complication. Fortunately there is no signs of infection and I think he is managing quite nicely here. The packing seems to have gone much better over the past week which I am very pleased in regard to. Electronic Signature(s) Signed: 08/14/2020 9:02:00 AM By: Worthy Keeler PA-C Entered By: Worthy Keeler on 08/14/2020 09:02:00 [place], [name] ([id number]) -------------------------------------------------------------------------------- Physical Exam Details Patient Name: [name]. Date of Service: 08/14/2020 8:15 AM Medical Record Number: [id number] Patient Account Number: [id number] Date of Birth/Sex: [birth year] (75 y.o. M) Treating RN: Donnamarie Poag Primary Care Provider: Otilio Miu Other Clinician: Referring Provider: Otilio Miu Treating Provider/Extender: Jeri Cos Weeks in Treatment: 2 Constitutional Well-nourished and well-hydrated in no acute distress. Respiratory normal breathing without difficulty. Psychiatric this patient is able to make decisions and demonstrates good insight into disease process. Alert and Oriented x 3. pleasant and cooperative. Notes Patient's wound bed showed signs of good granulation epithelization at this point. There does not appear to be any evidence of infection which is great news and overall very pleased with where things stand at this time. The patient likewise states that he is not really having any significant pain he is just feeling more irritation and some areas around where it is a little sensitive but again nothing terrible at all. Electronic Signature(s) Signed: 08/14/2020 9:02:25 AM By: Worthy Keeler PA-C Entered By: Worthy Keeler on 08/14/2020  09:02:24 [name] ([id number]) -------------------------------------------------------------------------------- Physician Orders Details Patient Name: [name]. Date of Service: 08/14/2020 8:15 AM Medical Record Number: [id number] Patient Account Number: [id number] Date of Birth/Sex: 1945/04/28 (75 y.o. 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 [place] for wound care. May utilize formulary equivalent dressing for wound treatment orders unless otherwise specified. Home Health Nurse may visit PRN to address patientos wound care needs. o Scheduled days for dressing changes to be completed; exception, patient has scheduled wound care visit that day. - Tuesday, Thursday, Saturday-dressings changed in office on scheduled appt day [facility] does NOT need to come on those days Bathing/ Shower/ Hygiene o Clean wound with Normal Saline or wound cleanser. o No tub bath. Wound Treatment Wound #1 - Knee Wound Laterality: Left, Lateral Cleanser: Normal Saline 3 x Per Week/30 Days Discharge Instructions: Wash your hands with soap and water. Remove old dressing, discard into plastic bag and place into trash. Cleanse the wound with Normal Saline  prior to applying a clean dressing using gauze  sponges, not tissues or cotton balls. Do not scrub or use excessive force. Pat dry using gauze sponges, not tissue or cotton balls. Primary Dressing: Iodoform 1/4x 5 (in/yd) 3 x Per Week/30 Days Discharge Instructions: Apply Iodoform Packing Strip as instructed. Primary Dressing: Silvercel Small 2x2 (in/in) 3 x Per Week/30 Days Discharge Instructions: Apply Silvercel Small 2x2 (in/in) on wound bed Secondary Dressing: ABD Pad 5x9 (in/in) 3 x Per Week/30 Days Discharge Instructions: Cover with ABD pad Secondary Dressing: Kerlix 4.5 x 4.1 (in/yd) 3 x Per Week/30 Days Discharge Instructions: Apply Kerlix 4.5 x 4.1 (in/yd) as instructed Secured With: 69M ACE Elastic Bandage With VELCRO Brand Closure, 4 (in) 3 x Per Week/30 Days Discharge Instructions: Secure dressing Electronic Signature(s) Signed: 08/14/2020 10:31:37 AM By: Donnamarie Poag Signed: 08/14/2020 6:51:50 PM By: Worthy Keeler PA-C Entered By: Donnamarie Poag on 08/14/2020 08:59:12 Burzynski, [name] ([id number]) -------------------------------------------------------------------------------- Problem List Details Patient Name: [name] Date of Service: 08/14/2020 8:15 AM Medical Record Number: [id number] Patient Account Number: [id number] Date of Birth/Sex: 1946-02-25 (75 y.o. 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 with fat 07/25/2020 No Yes layer exposed E11.622 Type 2 diabetes mellitus with other skin ulcer 07/25/2020 No Yes I10 Essential (primary) hypertension 07/25/2020 No Yes N18.30 Chronic kidney disease, stage 3  unspecified 07/25/2020 No Yes D56.1 Beta thalassemia 07/25/2020 No Yes Inactive Problems Resolved Problems Electronic Signature(s) Signed: 08/14/2020 8:18:11 AM By: Worthy Keeler PA-C Entered By: Worthy Keeler on 08/14/2020 08:18:11 [place], [name] ([id number]) -------------------------------------------------------------------------------- Progress Note Details Patient Name: [name] Date of Service: 08/14/2020 8:15 AM Medical Record Number: [id number] Patient Account Number: [id number] Date of Birth/Sex: 1945/11/02 (75 y.o. M) Treating RN: Donnamarie Poag Primary Care Provider: Otilio Miu Other Clinician: Referring Provider: Otilio Miu Treating Provider/Extender: Skipper Cliche in Treatment: 2 Subjective Chief Complaint Information obtained from Patient Left Knee Ulcer History of Present Illness (HPI) 07/25/2020 upon evaluation today patient presents for initial evaluation here in the clinic. I did have actually an extensive conversation with [facility] yesterday including the patient's physician who performed the surgery. Unfortunately this patient sustained a chainsaw injury on April 12 when he was working. He states he was cutting a limb when he saw the limb was likely going to fall but did not think that it would come his way. When it did it actually hit his arm and then knocked his arm down and he as he was holding a chainsaw this went into his leg on the lateral portion of his left knee. Subsequently he ended up going to urgent care which led to the orthopedic office which led to him going into surgery for a multilayer closure. There is still a small area that is open unfortunately that could not be completely closed and the lower portion of the wound laterally. The patient fortunately is not having a significant amount of pain he does have a little bit of hematoma here it is going to be cleaned out. He did have a temporary wound VAC in place though again this  does not appear to likely be necessary at this point based on what I am seeing  her at least this version of the [facility] is not going to be the best way to go. The patient does have diabetes mellitus type 2 and he has a hemoglobin A1c of 6.9 on most recent check. He is also currently on clindamycin and has an ABI of 1.18 on this side. His blood sugar this morning was 95. He keeps a close eye on this. Otherwise the patient does have a history of hypertension, chronic kidney disease stage III, and beta thalassemia. Of note when I spoke with the patient's surgeon yesterday at [facility] he stated that this is a patient whom they would not be following he wanted to turn care over to [place]. I am definitely okay with that I just wanted to make sure that since the patient was in a postop global that there was not anything they still wanted to monitor but again the care is pretty much been turned over to [place] after that conversation which is why did work to get the patient in today so that we can give appropriate home health orders for the patient as there did not appear to be any direction that was being covered as far as providing direct orders to home health aide which is what ever the hospital case manager and recommended according to what the physician told me. Nonetheless I think that we can probably get some better orders back to home health and get them out to see the patient of performing the appropriate dressing changes. 08/08/2020 upon evaluation today patient appears to be doing excellent in regard to his knee ulcer. I think this is doing quite well although I think there may be over packing into the area of undermining currently. We can address that at this point. Otherwise the patient seems to be doing quite well. 08/14/2020 upon evaluation today patient appears to be doing well with regard to his knee. Has been tolerating the dressing changes without complication. Fortunately there is no signs of infection  and I think he is managing quite nicely here. The packing seems to have gone much better over the past week which I am very pleased in regard to. Objective Constitutional Well-nourished and well-hydrated in no acute distress. Vitals Time Taken: 8:25 AM, Height: 70 in, Weight: 190 lbs, BMI: 27.3, Temperature: 97.9 F, Pulse: 60 bpm, Respiratory Rate: 18 breaths/min, Blood Pressure: 145/81 mmHg. Respiratory normal breathing without difficulty. Psychiatric this patient is able to make decisions and demonstrates good insight into disease process. Alert and Oriented x 3. pleasant and cooperative. General Notes: Patient's wound bed showed signs of good granulation epithelization at this point. There does not appear to be any evidence of infection which is great news and overall very pleased with where things stand at this time. The patient likewise states that he is not really having any significant pain he is just feeling more irritation and some areas around where it is a little sensitive but again nothing terrible at all. Integumentary (Hair, Skin) Moosman, Peniel T. ([id number]) Wound #1 status is Open. Original cause of wound was Trauma. The date acquired was: 07/18/2020. The wound has been in treatment 2 weeks. The wound is located on the Left,Lateral Knee. The wound measures 4cm length x 2cm width x 0.2cm depth; 6.283cm^2 area and 1.257cm^3 volume. There is Fat Layer (Subcutaneous Tissue) exposed. There is a small amount of serosanguineous drainage noted. There is medium (34- 66%) red, pink granulation within the wound bed. There is a medium (34-66%) amount of necrotic tissue within the  wound bed including Eschar and Adherent Slough. 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 Home Health: [facility] for wound care. May utilize formulary equivalent dressing for wound treatment orders unless otherwise specified. Home Health Nurse may visit PRN to address patient s wound care needs. Scheduled days for dressing changes to be completed; exception, patient has scheduled wound care visit that day. - Tuesday, Thursday, Saturday-dressings changed in office on scheduled appt day [facility] does NOT need to come on those days Bathing/ Shower/ Hygiene: Clean wound with Normal Saline or wound cleanser. No tub bath. WOUND #1: - Knee Wound Laterality: Left, Lateral Cleanser: Normal Saline 3 x Per Week/30 Days Discharge Instructions: Wash your hands with soap and water. Remove old dressing, discard into plastic bag and place into trash. Cleanse the wound with Normal Saline prior to applying a clean dressing using gauze sponges, not tissues or cotton balls. Do not scrub or use excessive force. Pat dry using gauze sponges, not tissue or cotton balls. Primary Dressing: Iodoform 1/4x 5 (in/yd) 3 x Per Week/30 Days Discharge Instructions: Apply Iodoform Packing Strip as instructed. Primary Dressing: Silvercel Small 2x2 (in/in) 3 x Per Week/30 Days Discharge Instructions: Apply Silvercel Small 2x2 (in/in) on wound bed Secondary Dressing: ABD Pad 5x9 (in/in) 3 x Per Week/30 Days Discharge Instructions: Cover with ABD pad Secondary Dressing: Kerlix 4.5 x 4.1 (in/yd) 3 x Per Week/30 Days Discharge Instructions: Apply Kerlix 4.5 x 4.1 (in/yd) as instructed Secured With: 53M ACE Elastic Bandage With VELCRO Brand Closure, 4 (in) 3 x Per Week/30 Days Discharge Instructions: Secure dressing 1. Would recommend currently that we going continue with the wound care measures as before. We will  switch to 1/4 inch packing strip into the wound region just in a single layer so this is not too thick we do not want to over pack the area I think not a repacking over the past week has come a long way as far as getting this to heal appropriately. 2. I am also can recommend at this time that we continue with the ABD pad, roll gauze, and Ace wrap to secure in place. This is causing a little bit of swelling distally but nothing too dramatic according to the patient. Its not causing him any problems is just something is noticed. We will see patient back for reevaluation in 1 week here in the clinic. If anything worsens or changes patient will contact our office for additional recommendations. Electronic Signature(s) Signed: 08/14/2020 9:03:12 AM By: Worthy Keeler PA-C Entered By: Worthy Keeler on 08/14/2020 09:03:11 Triggs, [name] ([id number]) -------------------------------------------------------------------------------- SuperBill Details Patient Name: [name]. Date of Service: 08/14/2020 Medical Record Number: [id number] Patient Account Number: [id number] Date of Birth/Sex: [birth year] (75 y.o. 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 E11.622 Type 2 diabetes mellitus with other skin ulcer I10 Essential (primary) hypertension N18.30 Chronic kidney disease,  stage 3 unspecified D56.1 Beta thalassemia Facility Procedures CPT4 Code: [id number] Description: [id number] - WOUND CARE VISIT-LEV 2 EST PT Modifier: Quantity: 1 Physician Procedures CPT4 Code: [id number] Description: 29562 - WC PHYS LEVEL 3 -  EST PT Modifier: Quantity: 1 CPT4 Code: Description: ICD-10 Diagnosis Description T81.31XA Disruption of external operation (surgical) wound, not elsewhere classifie Z30.865 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

## 2020-08-15 ENCOUNTER — Ambulatory Visit: Payer: Medicare Other | Admitting: Physician Assistant

## 2020-08-15 NOTE — Discharge Summary (Signed)
Physician Discharge Summary  Patient ID: [name] MRN: [id number] DOB/AGE: [birth year] 75 y.o.  Admit date: 07/18/2020 Discharge date: 07/19/2020 Admission Diagnoses:  Laceration of left knee  Discharge Diagnoses:  Principal Problem:   Laceration of left knee Active Problems:   DM type 2 causing CKD stage 3 ([place])   Essential hypertension   Coronary artery disease   CAD of autologous bypass graft   Knee laceration, left, initial encounter   Laceration and contusion of cerebral cortex ([place])   Hyperlipidemia   Past Medical History:  Diagnosis Date  . Coronary artery disease   . Hyperlipidemia   . Hypertension   . Type II diabetes mellitus ([facility])     Surgeries: Procedure(s): Left knee wound irrigation and debridement, wound vac application   Consultants (if any):   Discharged Condition: Improved  Hospital Course: [name] is an 75 y.o. male who was admitted 07/18/2020 with a diagnosis of Laceration of left knee and went to the operating room on 07/18/2020 and underwent the above named procedures.    He was given perioperative antibiotics:  Anti-infectives (From admission, onward)   Start     Dose/Rate Route Frequency Ordered Stop   07/19/20 0300  ceFAZolin (ANCEF) IVPB 2g/100 mL premix  Status:  Discontinued        2 g 200 mL/hr over 30 Minutes Intravenous Every 8 hours 07/18/20 2207 07/19/20 2056   07/19/20 0000  clindamycin (CLEOCIN) 300 MG capsule        300 mg Oral 3 times daily 07/19/20 1243 07/29/20 2359   07/18/20 2020  gentamicin (GARAMYCIN) 80 mg in sodium chloride 0.9 % 500 mL irrigation  Status:  Discontinued          As needed 07/18/20 2021 07/18/20 2059   07/18/20 1715  ceFAZolin (ANCEF) IVPB 1 g/50 mL premix        1 g 100 mL/hr over 30 Minutes Intravenous  Once 07/18/20 1703 07/18/20 2209    .  He was given sequential compression devices, early ambulation, and SCDs for DVT prophylaxis.  He benefited maximally from the hospital stay and  there were no complications. He will follow-up with home health wound care in 5-7 days.  Recent vital signs:  Vitals:   07/19/20 0455 07/19/20 0810  BP: (!) 110/51 (!) 108/54  Pulse: 72 63  Resp: 17 16  Temp: (!) 97.4 F (36.3 C) 97.9 F (36.6 C)  SpO2: 99% 96%    Recent laboratory studies:  Lab Results  Component Value Date   HGB 9.4 (L) 07/19/2020   HGB 11.6 (L) 07/18/2020   HGB 12.4 (A) 10/13/2018   Lab Results  Component Value Date   WBC 6.3 07/19/2020   PLT 104 (L) 07/19/2020   Lab Results  Component Value Date   INR 1.51 (H) 04/21/2015   Lab Results  Component Value Date   NA 136 07/19/2020   K 4.9 07/19/2020   CL 103 07/19/2020   CO2 25 07/19/2020   BUN 26 (H) 07/19/2020   CREATININE 1.78 (H) 07/19/2020   GLUCOSE 215 (H) 07/19/2020    Discharge Medications:   Allergies as of 07/19/2020   No Known Allergies     Medication List    TAKE these medications   acetaminophen 500 MG tablet Commonly known as: TYLENOL Take 2 tablets (1,000 mg total) by mouth every 8 (eight) hours.   aspirin 81 MG tablet Take 1 tablet (81 mg total) by mouth daily.   atorvastatin 40  MG tablet Commonly known as: LIPITOR Take 1 tablet (40 mg total) by mouth daily.   FREESTYLE LITE test strip Generic drug: glucose blood USE TO TEST EVERY DAY   glipiZIDE 10 MG tablet Commonly known as: GLUCOTROL Take 1 tablet (10 mg total) by mouth 2 (two) times daily before a meal.   losartan 50 MG tablet Commonly known as: COZAAR Take 1 tablet (50 mg total) by mouth daily.   metFORMIN 500 MG tablet Commonly known as: GLUCOPHAGE Take 1 tablet (500 mg total) by mouth 2 (two) times daily with a meal.   sildenafil 50 MG tablet Commonly known as: VIAGRA TAKE ONE TABLET DAILY AS NEEDED.     ASK your doctor about these medications   clindamycin 300 MG capsule Commonly known as: CLEOCIN Take 1 capsule (300 mg total) by mouth 3 (three) times daily for 10 days. Ask about: Should I  take this medication?       Diagnostic Studies: CT Knee Left Wo Contrast  Result Date: 07/18/2020 CLINICAL DATA:  Left leg laceration from chain saw EXAM: CT OF THE LEFT KNEE WITHOUT CONTRAST TECHNIQUE: Multidetector CT imaging of the left knee was performed according to the standard protocol. Multiplanar CT image reconstructions were also generated. COMPARISON:  None. FINDINGS: Bones/Joint/Cartilage No acute fracture of the left knee. Patella intact. Mild-moderate tricompartmental osteoarthritis of the left knee as manifested by joint space narrowing, subchondral sclerosis, and marginal osteophyte formation. Findings are most pronounced within the medial and patellofemoral compartments. 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 Neck measures approximately 1.1 cm. No fluid collection is seen overlying the bony protuberance. No suspicious bone lesion. No significant knee joint effusion. No air is seen within the joint space to suggest traumatic arthrotomy. A small Baker's cyst is noted. Ligaments Suboptimally assessed by CT. Muscles and Tendons Musculotendinous structures appear grossly intact. Soft tissues Irregularity within the soft tissues at the anterolateral aspect of the knee corresponding to reported laceration. There is a ill-defined hyperdense fluid collection/hematoma within the superficial soft tissues at this location measuring approximately 5.5 x 1.9 x 4.3 cm. Hematoma overlies the lateral patellar retinaculum. No radiopaque foreign body within the soft tissues. IMPRESSION: 1. Anterolateral left knee laceration with superficial hematoma measuring approximately 5.5 x 1.9 x 4.3 cm. No radiopaque foreign body within the soft tissues. 2. No acute osseous abnormality. No evidence of traumatic arthrotomy. 3. Mild-moderate tricompartmental osteoarthritis of the left knee, most pronounced  within the medial and patellofemoral compartments. 4. 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 5. Small Baker's cyst. Electronically Signed   By: Davina Poke D.O.   On: 07/18/2020 16:46    Disposition: Discharge disposition: 01-Home or Self Care       Discharge Instructions    Call MD / Call 911   Complete by: As directed    If you experience chest pain or shortness of breath, CALL 911 and be transported to the hospital emergency room.  If you develope a fever above 101 F, pus (white drainage) or increased drainage or redness at the wound, or calf pain, call your surgeon's office.   Constipation Prevention   Complete by: As directed    Drink plenty of fluids.  Prune juice may be helpful.  You may use a stool softener, such as Colace (over the counter) 100 mg  twice a day.  Use MiraLax (over the counter) for constipation as needed.   Diet - low sodium heart healthy   Complete by: As directed    Increase activity slowly as tolerated   Complete by: As directed        Follow-up Information    Juline Patch, MD Follow up in 1 week(s).   Specialty: Family Medicine Contact information: [address] [place] [address] [phone]                Signed: Renee Harder 08/15/2020, 2:14 PM

## 2020-08-16 DIAGNOSIS — N1832 Chronic kidney disease, stage 3b: Secondary | ICD-10-CM | POA: Diagnosis not present

## 2020-08-16 DIAGNOSIS — E1122 Type 2 diabetes mellitus with diabetic chronic kidney disease: Secondary | ICD-10-CM | POA: Diagnosis not present

## 2020-08-16 DIAGNOSIS — I25810 Atherosclerosis of coronary artery bypass graft(s) without angina pectoris: Secondary | ICD-10-CM | POA: Diagnosis not present

## 2020-08-16 DIAGNOSIS — I10 Essential (primary) hypertension: Secondary | ICD-10-CM | POA: Diagnosis not present

## 2020-08-16 DIAGNOSIS — S81012D Laceration without foreign body, left knee, subsequent encounter: Secondary | ICD-10-CM | POA: Diagnosis not present

## 2020-08-16 DIAGNOSIS — S8002XD Contusion of left knee, subsequent encounter: Secondary | ICD-10-CM | POA: Diagnosis not present

## 2020-08-21 ENCOUNTER — Encounter: Payer: Medicare Other | Admitting: Physician Assistant

## 2020-08-21 ENCOUNTER — Other Ambulatory Visit: Payer: Self-pay

## 2020-08-21 DIAGNOSIS — L97802 Non-pressure chronic ulcer of other part of unspecified lower leg with fat layer exposed: Secondary | ICD-10-CM | POA: Diagnosis not present

## 2020-08-21 DIAGNOSIS — N186 End stage renal disease: Secondary | ICD-10-CM | POA: Diagnosis not present

## 2020-08-21 DIAGNOSIS — E11622 Type 2 diabetes mellitus with other skin ulcer: Secondary | ICD-10-CM | POA: Diagnosis not present

## 2020-08-21 DIAGNOSIS — I120 Hypertensive chronic kidney disease with stage 5 chronic kidney disease or end stage renal disease: Secondary | ICD-10-CM | POA: Diagnosis not present

## 2020-08-21 DIAGNOSIS — E1122 Type 2 diabetes mellitus with diabetic chronic kidney disease: Secondary | ICD-10-CM | POA: Diagnosis not present

## 2020-08-21 DIAGNOSIS — L97822 Non-pressure chronic ulcer of other part of left lower leg with fat layer exposed: Secondary | ICD-10-CM | POA: Diagnosis not present

## 2020-08-21 DIAGNOSIS — T8131XA Disruption of external operation (surgical) wound, not elsewhere classified, initial encounter: Secondary | ICD-10-CM | POA: Diagnosis not present

## 2020-08-21 NOTE — Progress Notes (Addendum)
[name] ([id number]) Visit Report for 08/21/2020 Chief Complaint Document Details Patient Name: [name], [name]. Date of Service: 08/21/2020 9:00 AM Medical Record Number: [id number] Patient Account Number: [id number] Date of Birth/Sex: Jun 22, 1945 (74 y.o. M) Treating RN: Donnamarie Poag Primary Care Provider: Otilio Miu Other Clinician: Referring Provider: Otilio Miu Treating Provider/Extender: Jeri Cos Weeks in Treatment: 3 Information Obtained from: Patient Chief Complaint Left Knee Ulcer Electronic Signature(s) Signed: 08/21/2020 9:15:10 AM By: Worthy Keeler PA-C Entered By: Worthy Keeler on 08/21/2020 09:15:10 [name], [name] ([id number]) -------------------------------------------------------------------------------- HPI Details Patient Name: [name]. Date of Service: 08/21/2020 9:00 AM Medical Record Number: [id number] Patient Account Number: [id number] Date of Birth/Sex: 02-16-1946 (74 y.o. M) Treating RN: Donnamarie Poag Primary Care Provider: Otilio Miu Other Clinician: Referring Provider: Otilio Miu Treating Provider/Extender: Skipper Cliche in Treatment: 3 History of Present Illness HPI Description: 07/25/2020 upon evaluation today patient presents for initial evaluation here in the clinic. I did have actually an extensive conversation with [facility] yesterday including the patient's physician who performed the surgery. Unfortunately this patient sustained a chainsaw injury on April 12 when he was working. He states he was cutting a limb when he saw the limb was likely going to fall but did not think that it would come his way. When it did it actually hit his arm and then knocked his arm down and he as he was holding a chainsaw this went into his leg on the lateral portion of his left knee. Subsequently he ended up going to urgent care which led to the orthopedic office which led to him going into surgery for a multilayer closure.  There is still a small area that is open unfortunately that could not be completely closed and the lower portion of the wound laterally. The patient fortunately is not having a significant amount of pain he does have a little bit of hematoma here it is going to be cleaned out. He did have a temporary wound VAC in place though again this does not appear to likely be necessary at this point based on what I am seeing her at least this version of the [facility] is not going to be the best way to go. The patient does have diabetes mellitus type 2 and he has a hemoglobin A1c of 6.9 on most recent check. He is also currently on clindamycin and has an ABI of 1.18 on this side. His blood sugar this morning was 95. He keeps a close eye on this. Otherwise the patient does have a history of hypertension, chronic kidney disease stage III, and beta thalassemia. Of note when I spoke with the patient's surgeon yesterday at [facility] he stated that this is a patient whom they would not be following he wanted to turn care over to [place]. I am definitely okay with that I just wanted to make sure that since the patient was in a postop global that there was not anything they still wanted to monitor but again the care is pretty much been turned over to [place] after that conversation which is why did work to get the patient in today so that we can give appropriate home health orders for the patient as there did not appear to be any direction that was being covered as far as providing direct orders to home health aide which is what ever the hospital case manager and recommended according to what the physician told me. Nonetheless I think that we can probably get some better orders  back to home health and get them out to see the patient of performing the appropriate dressing changes. 08/08/2020 upon evaluation today patient appears to be doing excellent in regard to his knee ulcer. I think this is doing quite well although I think there may  be over packing into the area of undermining currently. We can address that at this point. Otherwise the patient seems to be doing quite well. 08/14/2020 upon evaluation today patient appears to be doing well with regard to his knee. Has been tolerating the dressing changes without complication. Fortunately there is no signs of infection and I think he is managing quite nicely here. The packing seems to have gone much better over the past week which I am very pleased in regard to. 08/21/2020 upon evaluation today patient appears to be doing well with regard to his wound. He has been tolerating the dressing changes without complication. Fortunately there is no signs of active infection which is great news. I do think that we need to get this area to reattach and at this point I think collagen may be the best way to go about doing that. The other option will be removing the flap of skin overlying this area since there seems to be a little bit of epiboly occurring along the edge. Nonetheless I would prefer to get this to try to reattach and seal up but if not then we can always address that secondarily. Electronic Signature(s) Signed: 08/21/2020 10:25:17 AM By: Worthy Keeler PA-C Entered By: Worthy Keeler on 08/21/2020 10:25:17 [name], [name] ([id number]) -------------------------------------------------------------------------------- Physical Exam Details Patient Name: [name]. Date of Service: 08/21/2020 9:00 AM Medical Record Number: [id number] Patient Account Number: [id number] Date of Birth/Sex: [birth year] (74 y.o. M) Treating RN: Donnamarie Poag Primary Care Provider: Otilio Miu Other Clinician: Referring Provider: Otilio Miu Treating Provider/Extender: Jeri Cos Weeks in Treatment: 3 Constitutional Well-nourished and well-hydrated in no acute distress. Respiratory normal breathing without difficulty. Psychiatric this patient is able to make decisions and  demonstrates good insight into disease process. Alert and Oriented x 3. pleasant and cooperative. Notes Upon inspection patient's wound bed actually showed signs of good granulation epithelization at this point. I think he is healing quite nicely but still has some area here there is likely going to require some additional work and I think possibly switching to the silver collagen dressing would be appropriate at this point. The patient is in agreement with that plan. Electronic Signature(s) Signed: 08/21/2020 10:25:42 AM By: Worthy Keeler PA-C Entered By: Worthy Keeler on 08/21/2020 10:25:42 Irani, [name] ([id number]) -------------------------------------------------------------------------------- Physician Orders Details Patient Name: [name]. Date of Service: 08/21/2020 9:00 AM Medical Record Number: [id number] Patient Account Number: [id number] Date of Birth/Sex: [birth year] (74 y.o. 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 [place]: - [facility]--notice NEW ORDERS 5/16 CHANGE TO SILVER COLLAGEN/PRISMA o [place] for wound care. May utilize formulary equivalent dressing for wound treatment orders unless  otherwise specified. Home Health Nurse may visit PRN to address patientos wound care needs. o  Scheduled days for dressing changes to be completed; exception, patient has scheduled wound care visit that day. - Tuesday, Thursday, Saturday-dressings changed in office on scheduled appt day [facility] does NOT need to come on those days Bathing/ Shower/ Hygiene o Clean wound with Normal Saline or wound cleanser. o No tub bath. Wound Treatment Wound #1 - Knee Wound Laterality: Left, Lateral Cleanser: Normal Saline 3 x Per Week/30 Days Discharge Instructions: Wash your hands with soap and water. Remove old dressing, discard into plastic bag and place into trash. Cleanse the wound with Normal Saline prior to applying a clean dressing using gauze sponges, not tissues or cotton balls. Do not scrub or use excessive force. Pat dry using gauze sponges, not tissue or cotton balls. Primary Dressing: Prisma 4.34 (in) 3 x Per Week/30 Days Discharge Instructions: -PACK INTO WOUND TUNNEL Moisten w/normal saline or sterile water; Cover wound as directed. Do not remove from wound bed. Secondary Dressing: ABD Pad 5x9 (in/in) 3 x Per Week/30 Days Discharge Instructions: Cover with ABD pad Secondary Dressing: Kerlix 4.5 x 4.1 (in/yd) 3 x Per Week/30 Days Discharge Instructions: Apply Kerlix 4.5 x 4.1 (in/yd) as instructed Secured With: 27M ACE Elastic Bandage With VELCRO Brand Closure, 4 (in) 3 x Per Week/30 Days Discharge Instructions: Secure dressing Electronic Signature(s) Signed: 08/21/2020 5:15:41 PM By: Worthy Keeler PA-C Signed: 08/22/2020 2:35:55 PM By: Donnamarie Poag Entered By: Donnamarie Poag on 08/21/2020 09:24:39 [place], [name] ([id number]) -------------------------------------------------------------------------------- Problem List Details Patient Name: Robert Dolly T. Date of Service: 08/21/2020 9:00 AM Medical Record Number: [id number] Patient Account Number: [id number] Date of Birth/Sex: May 29, 1945 (74 y.o. 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 fat 07/25/2020 No Yes layer exposed E11.622 Type 2 diabetes mellitus with other skin ulcer 07/25/2020 No Yes I10 Essential (primary) hypertension 07/25/2020 No Yes N18.30 Chronic kidney disease, stage 3 unspecified 07/25/2020 No Yes D56.1 Beta thalassemia 07/25/2020 No Yes Inactive Problems Resolved Problems Electronic Signature(s) Signed: 08/21/2020 9:15:03 AM By: Worthy Keeler PA-C Entered By: Worthy Keeler on 08/21/2020 09:15:03 [place], [name] ([id number]) -------------------------------------------------------------------------------- Progress Note Details Patient Name: Robert Dolly T. Date of Service: 08/21/2020 9:00 AM Medical Record Number: [id number] Patient Account Number: [id number] Date of Birth/Sex: [birth year] (74 y.o. M) Treating RN: Donnamarie Poag Primary Care Provider: Otilio Miu Other Clinician: Referring Provider: Otilio Miu Treating Provider/Extender: Skipper Cliche in Treatment: 3 Subjective Chief Complaint Information obtained from Patient Left Knee Ulcer History of Present Illness (HPI) 07/25/2020 upon evaluation today patient presents for initial evaluation here in the clinic. I did have actually an extensive conversation with [facility] yesterday including the patient's physician who performed the surgery. Unfortunately this patient sustained a chainsaw injury on April 12 when he was working. He states he was cutting a limb when he saw the limb was likely going to fall but did not think that it would come his way. When it did it actually hit his arm and then knocked  his arm down and he as he was holding a chainsaw this went into his leg on the lateral portion of his left knee.  Subsequently he ended up going to urgent care which led to the orthopedic office which led to him going into surgery for a multilayer closure. There is still a small area that is open unfortunately that could not be completely closed and the lower portion of the wound laterally. The patient fortunately is not having a significant amount of pain he does have a little bit of hematoma here it is going to be cleaned out. He did have a temporary wound VAC in place though again this does not appear to likely be necessary at this point based on what I am seeing her at least this version of the [facility] is not going to be the best way to go. The patient does have diabetes mellitus type 2 and he has a hemoglobin A1c of 6.9 on most recent check. He is also currently on clindamycin and has an ABI of 1.18 on this side. His blood sugar this morning was 95. He keeps a close eye on this. Otherwise the patient does have a history of hypertension, chronic kidney disease stage III, and beta thalassemia. Of note when I spoke with the patient's surgeon yesterday at [facility] he stated that this is a patient whom they would not be following he wanted to turn care over to [place]. I am definitely okay with that I just wanted to make sure that since the patient was in a postop global that there was not anything they still wanted to monitor but again the care is pretty much been turned over to [place] after that conversation which is why did work to get the patient in today so that we can give appropriate home health orders for the patient as there did not appear to be any direction that was being covered as far as providing direct orders to home health aide which is what ever the hospital case manager and recommended according to what the physician told me. Nonetheless I think that we can probably get some better orders back  to home health and get them out to see the patient of performing the appropriate dressing changes. 08/08/2020 upon evaluation today patient appears to be doing excellent in regard to his knee ulcer. I think this is doing quite well although I think there may be over packing into the area of undermining currently. We can address that at this point. Otherwise the patient seems to be doing quite well. 08/14/2020 upon evaluation today patient appears to be doing well with regard to his knee. Has been tolerating the dressing changes without complication. Fortunately there is no signs of infection and I think he is managing quite nicely here. The packing seems to have gone much better over the past week which I am very pleased in regard to. 08/21/2020 upon evaluation today patient appears to be doing well with regard to his wound. He has been tolerating the dressing changes without complication. Fortunately there is no signs of active infection which is great news. I do think that we need to get this area to reattach and at this point I think collagen may be the best way to go about doing that. The other option will be removing the flap of skin overlying this area since there seems to be a little bit of epiboly occurring along the edge. Nonetheless I would prefer to get this to try to reattach and seal up but if not then we can always address that secondarily. Objective Constitutional Well-nourished and well-hydrated in no acute distress. Vitals Time Taken:  8:03 AM, Height: 70 in, Weight: 190 lbs, BMI: 27.3, Temperature: 97.8 F, Pulse: 60 bpm, Respiratory Rate: 18 breaths/min, Blood Pressure: 118/73 mmHg. Respiratory normal breathing without difficulty. Psychiatric this patient is able to make decisions and demonstrates good insight into disease process. Alert and Oriented x 3. pleasant and cooperative. [name], [name] ([id number]) General Notes: Upon inspection patient's wound bed actually showed  signs of good granulation epithelization at this point. I think he is healing quite nicely but still has some area here there is likely going to require some additional work and I think possibly switching to the silver collagen dressing would be appropriate at this point. The patient is in agreement with that plan. Integumentary (Hair, Skin) Wound #1 status is Open. Original cause of wound was Trauma. The date acquired was: 07/18/2020. The wound has been in treatment 3 weeks. The wound is located on the Left,Lateral Knee. The wound measures 3cm length x 1cm width x 0.2cm depth; 2.356cm^2 area and 0.471cm^3 volume. There is Fat Layer (Subcutaneous Tissue) exposed. There is no undermining noted, however, there is tunneling at 1:00 with a maximum distance of 0.8cm. There is a small amount of serosanguineous drainage noted. There is medium (34-66%) red, pink granulation within the wound bed. There is a medium (34-66%) amount of necrotic tissue within the wound bed including Eschar and Adherent Slough. 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 Home Health: [place]: - [facility]--notice NEW ORDERS 5/16 CHANGE TO SILVER COLLAGEN/PRISMA [place] for wound care. May utilize formulary equivalent dressing for wound treatment orders unless otherwise specified. Home Health Nurse may visit PRN to address patient s wound care needs. Scheduled days for dressing changes to be completed; exception, patient has scheduled wound care visit that day. - Tuesday, Thursday, Saturday-dressings changed in office on scheduled  appt day [facility] does NOT need to come on those days Bathing/ Shower/ Hygiene: Clean wound with Normal Saline or wound cleanser. No tub bath. WOUND #1: - Knee Wound Laterality: Left, Lateral Cleanser: Normal Saline 3 x Per Week/30 Days Discharge Instructions: Wash your hands with soap and water. Remove old dressing, discard into plastic bag and place into trash. Cleanse the wound with Normal Saline prior to applying a clean dressing using gauze sponges, not tissues or cotton balls. Do not scrub or use excessive force. Pat dry using gauze sponges, not tissue or cotton balls. Primary Dressing: Prisma 4.34 (in) 3 x Per Week/30 Days Discharge Instructions: -PACK INTO WOUND TUNNEL Moisten w/normal saline or sterile water; Cover wound as directed. Do not remove from wound bed. Secondary Dressing: ABD Pad 5x9 (in/in) 3 x Per Week/30 Days Discharge Instructions: Cover with ABD pad Secondary Dressing: Kerlix 4.5 x 4.1 (in/yd) 3 x Per Week/30 Days Discharge Instructions: Apply Kerlix 4.5 x 4.1 (in/yd) as instructed Secured With: 67M ACE Elastic Bandage With VELCRO Brand Closure, 4 (in) 3 x Per Week/30 Days Discharge Instructions: Secure dressing 1. Would recommend currently that we going continue with the wound care measures as before and the patient is in agreement with the plan although we will switch the packing to silver collagen instead of the packing strip. 2. I am also can recommend that we have the  patient go ahead and cover with an ABD pad and roll gauze to secure in place. 3. I am also going to suggest that we continue to monitor for how this is improving from a healing perspective. He may become necessary to remove the skin over top of this area as it seems to be very thin and may not be very viable to reconnect to the area underlying. With that being said I would like to try to get this to granulate in first before going down this road. We will see patient back for reevaluation in 1 week here in  the clinic. If anything worsens or changes patient will contact our office for additional recommendations. Electronic Signature(s) [name], [name] ([id number]) Signed: 08/21/2020 10:26:35 AM By: Worthy Keeler PA-C Entered By: Worthy Keeler on 08/21/2020 10:26:35 [place], [name] ([id number]) -------------------------------------------------------------------------------- SuperBill Details Patient Name: [name]. Date of Service: 08/21/2020 Medical Record Number: [id number] Patient Account Number: [id number] Date of Birth/Sex: [birth year] (75 y.o. 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 E11.622 Type 2 diabetes mellitus with other skin ulcer I10 Essential (primary) hypertension N18.30 Chronic kidney disease, stage 3 unspecified D56.1 Beta thalassemia Facility Procedures CPT4 Code: 29021115 Description: [phone] - WOUND CARE VISIT-LEV 2 EST PT Modifier: Quantity: 1 Physician Procedures CPT4 Code: 2233612 Description: 99213 - WC PHYS LEVEL 3 - EST PT Modifier: Quantity: 1 CPT4 Code: Description: ICD-10 Diagnosis Description T81.31XA Disruption of external operation (surgical) wound, not elsewhere classifie L97.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

## 2020-08-22 NOTE — Progress Notes (Signed)
ELDRA, WORD ([id number]) Visit Report for 08/21/2020 Arrival Information Details Patient Name: [name], [name]. Date of Service: 08/21/2020 9:00 AM Medical Record Number: [id number] Patient Account Number: [id number] Date of Birth/Sex: 1945-09-02 (74 y.o. M) Treating RN: Donnamarie Poag Primary Care Jsaon Yoo: Otilio Miu Other Clinician: Referring Makara Lanzo: Otilio Miu Treating Pasha Broad/Extender: Skipper Cliche in Treatment: 3 Visit Information History Since Last Visit Added or deleted any medications: No Patient Arrived: Ambulatory Had a fall or experienced change in No Arrival Time: 09:05 activities of daily living that may affect Accompanied By: self risk of falls: Transfer Assistance: None Hospitalized since last visit: No Patient Identification Verified: Yes Has Dressing in Place as Prescribed: Yes Secondary Verification Process Completed: Yes Pain Present Now: No Patient Has Alerts: Yes Patient Alerts: Patient on Blood Thinner on ASPIRIN DIABETIC Electronic Signature(s) Signed: 08/22/2020 2:35:55 PM By: Donnamarie Poag Entered By: Donnamarie Poag on 08/21/2020 09:05:44 [name], [name] ([id number]) -------------------------------------------------------------------------------- Clinic Level of Care Assessment Details Patient Name: Robert Dolly T. Date of Service: 08/21/2020 9:00 AM Medical Record Number: [id number] Patient Account Number: [id number] Date of Birth/Sex: [birth year] (74 y.o. M) Treating RN: Donnamarie Poag Primary Care Merit Maybee: Otilio Miu Other Clinician: Referring Sheretha Shadd: Otilio Miu Treating Lateya Dauria/Extender: Skipper Cliche in Treatment: 3 Clinic Level of Care Assessment Items TOOL 4 Quantity Score '[]'  - Use when only an EandM is performed on FOLLOW-UP visit 0 ASSESSMENTS - Nursing Assessment / Reassessment '[]'  - Reassessment of Co-morbidities (includes updates in patient status) 0 '[]'  - 0 Reassessment of Adherence to Treatment  Plan ASSESSMENTS - Wound and Skin Assessment / Reassessment X - Simple Wound Assessment / Reassessment - one wound 1 5 '[]'  - 0 Complex Wound Assessment / Reassessment - multiple wounds '[]'  - 0 Dermatologic / Skin Assessment (not related to wound area) ASSESSMENTS - Focused Assessment '[]'  - Circumferential Edema Measurements - multi extremities 0 '[]'  - 0 Nutritional Assessment / Counseling / Intervention '[]'  - 0 Lower Extremity Assessment (monofilament, tuning fork, pulses) '[]'  - 0 Peripheral Arterial Disease Assessment (using hand held doppler) ASSESSMENTS - Ostomy and/or Continence Assessment and Care '[]'  - Incontinence Assessment and Management 0 '[]'  - 0 Ostomy Care Assessment and Management (repouching, etc.) PROCESS - Coordination of Care X - Simple Patient / Family Education for ongoing care 1 15 '[]'  - 0 Complex (extensive) Patient / Family Education for ongoing care '[]'  - 0 Staff obtains Programmer, systems, Records, Test Results / Process Orders '[]'  - 0 Staff telephones HHA, Nursing Homes / Clarify orders / etc '[]'  - 0 Routine Transfer to another Facility (non-emergent condition) '[]'  - 0 Routine Hospital Admission (non-emergent condition) '[]'  - 0 New Admissions / Biomedical engineer / Ordering NPWT, Apligraf, etc. '[]'  - 0 Emergency Hospital Admission (emergent condition) '[]'  - 0 Simple Discharge Coordination '[]'  - 0 Complex (extensive) Discharge Coordination PROCESS - Special Needs '[]'  - Pediatric / Minor Patient Management 0 '[]'  - 0 Isolation Patient Management '[]'  - 0 Hearing / Language / Visual special needs '[]'  - 0 Assessment of Community assistance (transportation, D/C planning, etc.) '[]'  - 0 Additional assistance / Altered mentation '[]'  - 0 Support Surface(s) Assessment (bed, cushion, seat, etc.) INTERVENTIONS - Wound Cleansing / Measurement [name], [name] ([id number]) X- 1 5 Simple Wound Cleansing - one wound '[]'  - 0 Complex Wound Cleansing - multiple wounds '[]'  -  0 Wound Imaging (photographs - any number of wounds) '[]'  - 0 Wound Tracing (instead of photographs) X- 1 5 Simple Wound Measurement - one wound '[]'  - 0  Complex Wound Measurement - multiple wounds INTERVENTIONS - Wound Dressings X - Small Wound Dressing one or multiple wounds 1 10 '[]'  - 0 Medium Wound Dressing one or multiple wounds '[]'  - 0 Large Wound Dressing one or multiple wounds '[]'  - 0 Application of Medications - topical '[]'  - 0 Application of Medications - injection INTERVENTIONS - Miscellaneous '[]'  - External ear exam 0 '[]'  - 0 Specimen Collection (cultures, biopsies, blood, body fluids, etc.) '[]'  - 0 Specimen(s) / Culture(s) sent or taken to Lab for analysis '[]'  - 0 Patient Transfer (multiple staff / Civil Service fast streamer / Similar devices) '[]'  - 0 Simple Staple / Suture removal (25 or less) '[]'  - 0 Complex Staple / Suture removal (26 or more) '[]'  - 0 Hypo / Hyperglycemic Management (close monitor of Blood Glucose) '[]'  - 0 Ankle / Brachial Index (ABI) - do not check if billed separately X- 1 5 Vital Signs Has the patient been seen at the hospital within the last three years: Yes Total Score: 45 Level Of Care: New/Established - Level 2 Electronic Signature(s) Signed: 08/22/2020 2:35:55 PM By: Donnamarie Poag Entered By: Donnamarie Poag on 08/21/2020 09:25:23 [name] ([id number]) -------------------------------------------------------------------------------- Encounter Discharge Information Details Patient Name: Robert Dolly T. Date of Service: 08/21/2020 9:00 AM Medical Record Number: [id number] Patient Account Number: [id number] Date of Birth/Sex: 1946-03-19 (74 y.o. M) Treating RN: Donnamarie Poag Primary Care Luvern Mcisaac: Otilio Miu Other Clinician: Referring Keerat Denicola: Otilio Miu Treating Evetta Renner/Extender: Skipper Cliche in Treatment: 3 Encounter Discharge Information Items Discharge Condition: Stable Ambulatory Status: Ambulatory Discharge Destination:  Home Transportation: Private Auto Accompanied By: self Schedule Follow-up Appointment: Yes Clinical Summary of Care: Electronic Signature(s) Signed: 08/21/2020 5:31:07 PM By: Jeanine Luz Entered By: Jeanine Luz on 08/21/2020 09:40:31 [name], [name] ([id number]) -------------------------------------------------------------------------------- Lower Extremity Assessment Details Patient Name: Robert Dolly T. Date of Service: 08/21/2020 9:00 AM Medical Record Number: [id number] Patient Account Number: [id number] Date of Birth/Sex: 01-Mar-1946 (74 y.o. M) Treating RN: Donnamarie Poag Primary Care Maitland Muhlbauer: Otilio Miu Other Clinician: Referring Ceili Boshers: Otilio Miu Treating Makyah Lavigne/Extender: Jeri Cos Weeks in Treatment: 3 Edema Assessment Assessed: [Left: Yes] [Right: No] Edema: [Left: N] [Right: o] Vascular Assessment Pulses: Dorsalis Pedis Palpable: [Left:Yes] Electronic Signature(s) Signed: 08/22/2020 2:35:55 PM By: Donnamarie Poag Entered By: Donnamarie Poag on 08/21/2020 09:12:54 [name], [name] ([id number]) -------------------------------------------------------------------------------- Multi Wound Chart Details Patient Name: Robert Dolly T. Date of Service: 08/21/2020 9:00 AM Medical Record Number: [id number] Patient Account Number: [id number] Date of Birth/Sex: 06/09/1945 (74 y.o. M) Treating RN: Donnamarie Poag Primary Care Chesley Veasey: Otilio Miu Other Clinician: Referring Ferol Laiche: Otilio Miu Treating Jenefer Woerner/Extender: Jeri Cos Weeks in Treatment: 3 Vital Signs Height(in): 70 Pulse(bpm): 60 Weight(lbs): 190 Blood Pressure(mmHg): 118/73 Body Mass Index(BMI): 27 Temperature(F): 97.8 Respiratory Rate(breaths/min): 18 Photos: [N/A:N/A] Wound Location: Left, Lateral Knee N/A N/A Wounding Event: Trauma N/A N/A Primary Etiology: Open Surgical Wound N/A N/A Comorbid History: Cataracts, Anemia, Coronary Artery N/A N/A Disease, Hypertension, Type  II Diabetes, End Stage Renal Disease Date Acquired: 07/18/2020 N/A N/A Weeks of Treatment: 3 N/A N/A Wound Status: Open N/A N/A Measurements L x W x D (cm) 3x1x0.2 N/A N/A Area (cm) : 2.356 N/A N/A Volume (cm) : 0.471 N/A N/A % Reduction in Area: 94.80% N/A N/A % Reduction in Volume: 94.80% N/A N/A Position 1 (o'clock): 1 Maximum Distance 1 (cm): 0.8 Tunneling: Yes N/A N/A Classification: Full Thickness Without Exposed N/A N/A Support Structures Exudate Amount: Small N/A N/A Exudate Type: Serosanguineous N/A N/A Exudate Color: red, brown N/A N/A Granulation  Amount: Medium (34-66%) N/A N/A Granulation Quality: Red, Pink N/A N/A Necrotic Amount: Medium (34-66%) N/A N/A Necrotic Tissue: Eschar, Adherent Slough N/A N/A Exposed Structures: Fat Layer (Subcutaneous Tissue): N/A N/A Yes Fascia: No Tendon: No Muscle: No Joint: No Bone: No Epithelialization: Medium (34-66%) N/A N/A Treatment Notes Electronic Signature(s) Signed: 08/22/2020 2:35:55 PM By: [name] ([id number]) Entered By: Donnamarie Poag on 08/21/2020 09:18:06 [name], [name] ([id number]) -------------------------------------------------------------------------------- [place] Details Patient Name: [name]. Date of Service: 08/21/2020 9:00 AM Medical Record Number: [id number] Patient Account Number: [id number] Date of Birth/Sex: Dec 08, 1945 (74 y.o. M) Treating RN: Donnamarie Poag Primary Care Welford Christmas: Otilio Miu Other Clinician: Referring Radie Berges: Otilio Miu Treating Paisleigh Maroney/Extender: Jeri Cos Weeks in Treatment: 3 Active Inactive Wound/Skin Impairment Nursing Diagnoses: Impaired tissue integrity Goals: Patient/caregiver will verbalize understanding of skin care regimen Date Initiated: 07/25/2020 Date Inactivated: 08/14/2020 Target Resolution Date: 07/25/2020 Goal Status: Met Ulcer/skin breakdown will have a volume reduction of 30% by week 4 Date  Initiated: 07/25/2020 Date Inactivated: 08/14/2020 Target Resolution Date: 08/24/2020 Goal Status: Met Ulcer/skin breakdown will have a volume reduction of 50% by week 8 Date Initiated: 07/25/2020 Target Resolution Date: 09/24/2020 Goal Status: Active Ulcer/skin breakdown will have a volume reduction of 80% by week 12 Date Initiated: 07/25/2020 Target Resolution Date: 10/24/2020 Goal Status: Active Ulcer/skin breakdown will heal within 14 weeks Date Initiated: 07/25/2020 Target Resolution Date: 11/24/2020 Goal Status: Active Interventions: Assess patient/caregiver ability to obtain necessary supplies Assess patient/caregiver ability to perform ulcer/skin care regimen upon admission and as needed Assess ulceration(s) every visit Provide education on ulcer and skin care Treatment Activities: Referred to DME Petrona Wyeth for dressing supplies : 07/25/2020 Skin care regimen initiated : 07/25/2020 Notes: Electronic Signature(s) Signed: 08/22/2020 2:35:55 PM By: Donnamarie Poag Entered By: Donnamarie Poag on 08/21/2020 09:17:51 [name], [name] ([id number]) -------------------------------------------------------------------------------- Pain Assessment Details Patient Name: Robert Dolly T. Date of Service: 08/21/2020 9:00 AM Medical Record Number: [id number] Patient Account Number: [id number] Date of Birth/Sex: [birth year] (74 y.o. M) Treating RN: Donnamarie Poag Primary Care Aziya Arena: Otilio Miu Other Clinician: Referring Almin Livingstone: Otilio Miu Treating Beaulah Romanek/Extender: Skipper Cliche in Treatment: 3 Active Problems Location of Pain Severity and Description of Pain Patient Has Paino No Site Locations Rate the pain. Current Pain Level: 0 Pain Management and Medication Current Pain Management: Electronic Signature(s) Signed: 08/22/2020 2:35:55 PM By: Donnamarie Poag Entered By: Donnamarie Poag on 08/21/2020 09:08:18 [name]  ([id number]) -------------------------------------------------------------------------------- Patient/Caregiver Education Details Patient Name: [name]. Date of Service: 08/21/2020 9:00 AM Medical Record Number: [id number] Patient Account Number: [id number] Date of Birth/Gender: Sep 01, 1945 (75 y.o. M) Treating RN: Donnamarie Poag Primary Care Physician: Otilio Miu Other Clinician: Referring Physician: Otilio Miu Treating Physician/Extender: Skipper Cliche in Treatment: 3 Education Assessment Education Provided To: Patient Education Topics Provided Basic Hygiene: Methods: Explain/Verbal Wound/Skin Impairment: Methods: Explain/Verbal Responses: State content correctly Electronic Signature(s) Signed: 08/22/2020 2:35:55 PM By: Donnamarie Poag Entered By: Donnamarie Poag on 08/21/2020 09:18:26 [name], [name] ([id number]) -------------------------------------------------------------------------------- Wound Assessment Details Patient Name: [name]. Date of Service: 08/21/2020 9:00 AM Medical Record Number: [id number] Patient Account Number: [id number] Date of Birth/Sex: [birth year] (74 y.o. M) Treating RN: Donnamarie Poag Primary Care Randee Upchurch: Otilio Miu Other Clinician: Referring Caraline Deutschman: Otilio Miu Treating Melville Engen/Extender: Jeri Cos Weeks in Treatment: 3 Wound Status Wound Number: 1 Primary Open Surgical Wound Etiology: Wound Location: Left, Lateral Knee Wound Open Wounding Event: Trauma Status: Date Acquired: 07/18/2020 Notes: WOUND VAC IN PLACE UPON ADMISSION POST Weeks  Of Treatment: 3 SURGERY Clustered Wound: No Comorbid Cataracts, Anemia, Coronary Artery Disease, Wound under treatment by Stephnie Parlier outside of [place] History: Hypertension, Type II Diabetes, End Stage Renal Disease Photos Wound Measurements Length: (cm) 3 Width: (cm) 1 Depth: (cm) 0.2 Area: (cm) 2.356 Volume: (cm) 0.471 % Reduction in Area: 94.8% % Reduction in  Volume: 94.8% Epithelialization: Medium (34-66%) Tunneling: Yes Position (o'clock): 1 Maximum Distance: (cm) 0.8 Undermining: No Wound Description Classification: Full Thickness Without Exposed Support Structu Exudate Amount: Small Exudate Type: Serosanguineous Exudate Color: red, brown res Foul Odor After Cleansing: No Slough/Fibrino Yes Wound Bed Granulation Amount: Medium (34-66%) Exposed Structure Granulation Quality: Red, Pink Fascia Exposed: No Necrotic Amount: Medium (34-66%) Fat Layer (Subcutaneous Tissue) Exposed: Yes Necrotic Quality: Eschar, Adherent Slough Tendon Exposed: No Muscle Exposed: No Joint Exposed: No Bone Exposed: No Treatment Notes Wound #1 (Knee) Wound Laterality: Left, Lateral Cleanser Normal Saline [name], EDELEN ([id number]) Discharge Instruction: Wash your hands with soap and water. Remove old dressing, discard into plastic bag and place into trash. Cleanse the wound with Normal Saline prior to applying a clean dressing using gauze sponges, not tissues or cotton balls. Do not scrub or use excessive force. Pat dry using gauze sponges, not tissue or cotton balls. Peri-Wound Care Topical Primary Dressing Prisma 4.34 (in) Discharge Instruction: -PACK INTO WOUND TUNNEL Moisten w/normal saline or sterile water; Cover wound as directed. Do not remove from wound bed. Secondary Dressing ABD Pad 5x9 (in/in) Discharge Instruction: Cover with ABD pad Kerlix 4.5 x 4.1 (in/yd) Discharge Instruction: Apply Kerlix 4.5 x 4.1 (in/yd) as instructed Secured With 68M ACE Elastic Bandage With VELCRO Brand Closure, 4 (in) Discharge Instruction: Secure dressing Compression Wrap Compression Stockings Add-Ons Electronic Signature(s) Signed: 08/22/2020 2:35:55 PM By: Donnamarie Poag Entered By: Donnamarie Poag on 08/21/2020 09:12:34 [name], [name] ([id number]) -------------------------------------------------------------------------------- [place]  Details Patient Name: [name]. Date of Service: 08/21/2020 9:00 AM Medical Record Number: [id number] Patient Account Number: [id number] Date of Birth/Sex: [birth year] (74 y.o. M) Treating RN: Donnamarie Poag Primary Care Riah Kehoe: Otilio Miu Other Clinician: Referring Febe Champa: Otilio Miu Treating Corynne Scibilia/Extender: Jeri Cos Weeks in Treatment: 3 Vital Signs Time Taken: 08:03 Temperature (F): 97.8 Height (in): 70 Pulse (bpm): 60 Weight (lbs): 190 Respiratory Rate (breaths/min): 18 Body Mass Index (BMI): 27.3 Blood Pressure (mmHg): 118/73 Reference Range: 80 - 120 mg / dl Electronic Signature(s) Signed: 08/22/2020 2:35:55 PM By: Donnamarie Poag Entered ByDonnamarie Poag on 08/21/2020 09:08:08

## 2020-08-23 DIAGNOSIS — S8002XD Contusion of left knee, subsequent encounter: Secondary | ICD-10-CM | POA: Diagnosis not present

## 2020-08-23 DIAGNOSIS — N1832 Chronic kidney disease, stage 3b: Secondary | ICD-10-CM | POA: Diagnosis not present

## 2020-08-23 DIAGNOSIS — I10 Essential (primary) hypertension: Secondary | ICD-10-CM | POA: Diagnosis not present

## 2020-08-23 DIAGNOSIS — S81012D Laceration without foreign body, left knee, subsequent encounter: Secondary | ICD-10-CM | POA: Diagnosis not present

## 2020-08-23 DIAGNOSIS — I25810 Atherosclerosis of coronary artery bypass graft(s) without angina pectoris: Secondary | ICD-10-CM | POA: Diagnosis not present

## 2020-08-23 DIAGNOSIS — E1122 Type 2 diabetes mellitus with diabetic chronic kidney disease: Secondary | ICD-10-CM | POA: Diagnosis not present

## 2020-08-24 ENCOUNTER — Encounter: Payer: Self-pay | Admitting: Family Medicine

## 2020-08-24 ENCOUNTER — Ambulatory Visit (INDEPENDENT_AMBULATORY_CARE_PROVIDER_SITE_OTHER): Payer: Medicare Other | Admitting: Family Medicine

## 2020-08-24 ENCOUNTER — Other Ambulatory Visit: Payer: Self-pay

## 2020-08-24 VITALS — BP 132/70 | HR 68 | Ht 70.0 in | Wt 194.0 lb

## 2020-08-24 DIAGNOSIS — I10 Essential (primary) hypertension: Secondary | ICD-10-CM | POA: Diagnosis not present

## 2020-08-24 DIAGNOSIS — E7801 Familial hypercholesterolemia: Secondary | ICD-10-CM

## 2020-08-24 DIAGNOSIS — E1159 Type 2 diabetes mellitus with other circulatory complications: Secondary | ICD-10-CM

## 2020-08-24 DIAGNOSIS — N529 Male erectile dysfunction, unspecified: Secondary | ICD-10-CM | POA: Diagnosis not present

## 2020-08-24 MED ORDER — FREESTYLE LITE TEST VI STRP: ORAL_STRIP | 10 refills | Status: DC

## 2020-08-24 MED ORDER — SILDENAFIL CITRATE 50 MG PO TABS: ORAL_TABLET | ORAL | 11 refills | Status: DC

## 2020-08-24 NOTE — Progress Notes (Signed)
Date:  08/24/2020   Name:  [name]   DOB:  [birth year]   MRN:  [id number]   Chief Complaint: Diabetes and Hypertension  Diabetes He presents for his follow-up diabetic visit. He has type 2 diabetes mellitus. His disease course has been stable. There are no hypoglycemic associated symptoms. Pertinent negatives for hypoglycemia include no dizziness, headaches, nervousness/anxiousness or sweats. There are no diabetic associated symptoms. Pertinent negatives for diabetes include no blurred vision, no chest pain, no fatigue, no polydipsia, no polyphagia, no polyuria, no weakness and no weight loss. There are no hypoglycemic complications. Symptoms are stable. There are no diabetic complications. Pertinent negatives for diabetic complications include no CVA, PVD or retinopathy. There are no known risk factors for coronary artery disease. Current diabetic treatment includes oral agent (dual therapy) (glipizide/metformen). He is compliant with treatment all of the time. His weight is stable. He is following a generally healthy diet. Meal planning includes avoidance of concentrated sweets and carbohydrate counting. His home blood glucose trend is fluctuating minimally. His breakfast blood glucose is taken between 8-9 am.  Hypertension This is a chronic problem. The current episode started more than 1 year ago. The problem has been gradually improving since onset. The problem is controlled. Pertinent negatives include no anxiety, blurred vision, chest pain, headaches, malaise/fatigue, neck pain, orthopnea, palpitations, peripheral edema, PND, shortness of breath or sweats. Past treatments include angiotensin blockers. The current treatment provides moderate improvement. There is no history of angina, kidney disease, CAD/MI, CVA, heart failure, left ventricular hypertrophy, PVD or retinopathy. There is no history of chronic renal disease, a hypertension causing med or renovascular disease.   Hyperlipidemia This is a chronic problem. The current episode started more than 1 year ago. The problem is controlled. Recent lipid tests were reviewed and are normal. Exacerbating diseases include diabetes. He has no history of chronic renal disease. Pertinent negatives include no chest pain, myalgias or shortness of breath. Current antihyperlipidemic treatment includes statins. The current treatment provides moderate improvement of lipids. There are no compliance problems.  Risk factors for coronary artery disease include hypertension and dyslipidemia.  Erectile Dysfunction This is a chronic problem. The current episode started more than 1 year ago. The problem has been waxing and waning since onset. The nature of his difficulty is achieving erection and maintaining erection. He reports no anxiety. Irritative symptoms do not include frequency or urgency. Pertinent negatives include no chills, dysuria or hematuria.    Lab Results  Component Value Date   CREATININE 1.78 (H) 07/19/2020   BUN 26 (H) 07/19/2020   NA 136 07/19/2020   K 4.9 07/19/2020   CL 103 07/19/2020   CO2 25 07/19/2020   Lab Results  Component Value Date   CHOL 139 05/09/2020   HDL 43 05/09/2020   LDLCALC 78 05/09/2020   TRIG 94 05/09/2020   CHOLHDL 4.6 11/05/2017   Lab Results  Component Value Date   TSH 5.153 (H) 04/25/2015   Lab Results  Component Value Date   HGBA1C 6.9 (H) 05/09/2020   Lab Results  Component Value Date   WBC 6.3 07/19/2020   HGB 9.4 (L) 07/19/2020   HCT 29.9 (L) 07/19/2020   MCV 62.7 (L) 07/19/2020   PLT 104 (L) 07/19/2020   Lab Results  Component Value Date   ALT 16 07/19/2020   AST 19 07/19/2020   ALKPHOS 49 07/19/2020   BILITOT 1.0 07/19/2020     Review of Systems  Constitutional: Negative for chills,  fatigue, fever, malaise/fatigue and weight loss.  HENT: Negative for drooling, ear discharge, ear pain and sore throat.   Eyes: Negative for blurred vision.  Respiratory:  Negative for cough, shortness of breath and wheezing.   Cardiovascular: Negative for chest pain, palpitations, orthopnea, leg swelling and PND.  Gastrointestinal: Negative for abdominal pain, blood in stool, constipation, diarrhea and nausea.  Endocrine: Negative for polydipsia, polyphagia and polyuria.  Genitourinary: Negative for dysuria, frequency, hematuria and urgency.  Musculoskeletal: Negative for back pain, myalgias and neck pain.  Skin: Negative for rash.  Allergic/Immunologic: Negative for environmental allergies.  Neurological: Negative for dizziness, weakness and headaches.  Hematological: Does not bruise/bleed easily.  Psychiatric/Behavioral: Negative for suicidal ideas. The patient is not nervous/anxious.     Patient Active Problem List   Diagnosis Date Noted  . Laceration and contusion of cerebral cortex ([place])   . Hyperlipidemia   . Laceration of left knee 07/18/2020  . Knee laceration, left, initial encounter 07/18/2020  . Hyperlipidemia associated with type 2 diabetes mellitus ([place]) 05/13/2019  . Erectile dysfunction 05/13/2019  . Pyuria 01/30/2019  . Stage 3 chronic kidney disease ([place]) 01/30/2019  . Beta thalassemia minor 03/20/2016  . Anemia 03/06/2016  . 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 Thrombocytopenia ([place]) 04/20/2015  . Coronary artery disease 04/18/2015  . Unstable angina ([place]) 04/17/2015  . Elevated troponin 04/17/2015  . DM type 2 causing CKD stage 3 ([place]) 04/17/2015  . Essential hypertension 04/17/2015    No Known Allergies  Past Surgical History:  Procedure Laterality Date  . CARDIAC CATHETERIZATION N/A 04/18/2015   Procedure: Left Heart Cath and Coronary Angiography;  Surgeon: Yolonda Kida, MD;  Location: [facility];  Service: Cardiovascular;  Laterality: N/A;  . COLONOSCOPY  2014   cleared for 10 yrs  . CORONARY ARTERY BYPASS GRAFT N/A  04/21/2015   Procedure: CORONARY ARTERY BYPASS GRAFTING (CABG) X 4 UTILIZING THE LEFT INTERNAL MAMMARY ARTERY TO LAD, ENDOSCOPICALLY HARVESTED RIGHT SAPHENEOUS VEINS TO PD,DIAGONAL AND CIRCUMFLEX.;  Surgeon: Grace Isaac, MD;  Location: [facility];  Service: Open Heart Surgery;  Laterality: N/A;  . DUPUYTREN CONTRACTURE RELEASE Left ~ 2000  . EYE SURGERY Bilateral    cataract removed  . INCISION AND DRAINAGE Left 07/18/2020   Procedure: INCISION AND DRAINAGE LEFT KNEE;  Surgeon: Renee Harder, MD;  Location: [facility];  Service: Orthopedics;  Laterality: Left;  . KNEE ARTHROSCOPY Right ~ 1995  . TEE WITHOUT CARDIOVERSION N/A 04/21/2015   Procedure: TRANSESOPHAGEAL ECHOCARDIOGRAM (TEE);  Surgeon: Grace Isaac, MD;  Location: [facility];  Service: Open Heart Surgery;  Laterality: N/A;    Social History   Tobacco Use  . Smoking status: Never Smoker  . Smokeless tobacco: Never Used  Vaping Use  . Vaping Use: Never used  Substance Use Topics  . Alcohol use: Not Currently    Alcohol/week: 0.0 standard drinks  . Drug use: No     Medication list has been reviewed and updated.  Current Meds  Medication Sig  . acetaminophen (TYLENOL) 500 MG tablet Take 2 tablets (1,000 mg total) by mouth every 8 (eight) hours.  Marland Kitchen aspirin 81 MG tablet Take 1 tablet (81 mg total) by mouth daily.  Marland Kitchen atorvastatin (LIPITOR) 40 MG tablet Take 1 tablet (40 mg total) by mouth daily.  Marland Kitchen FREESTYLE LITE test strip USE TO TEST EVERY DAY  . glipiZIDE (GLUCOTROL) 10  MG tablet Take 1 tablet (10 mg total) by mouth 2 (two) times daily before a meal.  . losartan (COZAAR) 50 MG tablet Take 1 tablet (50 mg total) by mouth daily.  . metFORMIN (GLUCOPHAGE) 500 MG tablet Take 1 tablet (500 mg total) by mouth 2 (two) times daily with a meal.  . sildenafil (VIAGRA) 50 MG tablet TAKE ONE TABLET DAILY AS NEEDED.    PHQ 2/9 Scores 08/24/2020 05/09/2020 05/03/2020 01/17/2020  PHQ - 2 Score 0 0 0 0  PHQ- 9 Score 0 0 - 0    GAD 7 :  Generalized Anxiety Score 08/24/2020 05/09/2020 01/17/2020 09/13/2019  Nervous, Anxious, on Edge 0 0 0 0  Control/stop worrying 0 0 0 0  Worry too much - different things 0 0 0 0  Trouble relaxing 0 0 0 0  Restless 0 0 0 0  Easily annoyed or irritable 0 0 0 0  Afraid - awful might happen 0 0 0 0  Total GAD 7 Score 0 0 0 0  Anxiety Difficulty Not difficult at all - - -    BP Readings from Last 3 Encounters:  08/24/20 132/70  07/19/20 (!) 108/54  07/18/20 (!) 154/80    Physical Exam Vitals and nursing note reviewed.  HENT:     Head: Normocephalic.     Right Ear: External ear normal.     Left Ear: External ear normal.     Nose: Nose normal.  Eyes:     General: No scleral icterus.       Right eye: No discharge.        Left eye: No discharge.     Conjunctiva/sclera: Conjunctivae normal.     Pupils: Pupils are equal, round, and reactive to light.  Neck:     Thyroid: No thyromegaly.     Vascular: No JVD.     Trachea: No tracheal deviation.  Cardiovascular:     Rate and Rhythm: Normal rate and regular rhythm.     Heart sounds: Normal heart sounds. No murmur heard. No friction rub. No gallop.   Pulmonary:     Effort: No respiratory distress.     Breath sounds: Normal breath sounds. No wheezing, rhonchi or rales.  Abdominal:     General: Bowel sounds are normal.     Palpations: Abdomen is soft. There is no hepatomegaly, splenomegaly or mass.     Tenderness: There is no abdominal tenderness. There is no guarding or rebound.  Musculoskeletal:        General: No tenderness. Normal range of motion.     Cervical back: Normal range of motion and neck supple.  Lymphadenopathy:     Cervical: No cervical adenopathy.  Skin:    General: Skin is warm.     Findings: No rash.  Neurological:     Mental Status: He is alert and oriented to person, place, and time.     Cranial Nerves: No cranial nerve deficit.     Deep Tendon Reflexes: Reflexes are normal and symmetric.     Wt Readings  from Last 3 Encounters:  08/24/20 194 lb (88 kg)  07/18/20 190 lb (86.2 kg)  05/09/20 198 lb (89.8 kg)    BP 132/70   Pulse 68   Ht 5\' 10"  (1.778 m)   Wt 194 lb (88 kg)   BMI 27.84 kg/m   Assessment and Plan:   1. Type 2 diabetes mellitus with other circulatory complication, without long-term current use of insulin ([place]) .  Controlled.  Stable.  Continue glipizide and metformin.  Will check A1c for current level of control.  Patient needs refills on his freestyle lite test strips. - glucose blood (FREESTYLE LITE) test strip; Use as instructed  Dispense: 100 strip; Refill: 10 - Hemoglobin A1c  2. Essential hypertension Chronic.  Controlled.  Stable.  Blood pressure today is 132/70.  Continue losartan 50 mg once a day.  3. Familial hypercholesterolemia On it.  Controlled.  Stable.  Continue atorvastatin 40 mg once a day.  4. Erectile dysfunction, unspecified erectile dysfunction type Chronic.  Controlled.  Stable.  Patient desires refills on his Viagra which is currently 50 mg 1 tablet as needed with 7 refills. - sildenafil (VIAGRA) 50 MG tablet; TAKE ONE TABLET DAILY AS NEEDED.  Dispense: 7 tablet; Refill: 11

## 2020-08-25 LAB — HEMOGLOBIN A1C
Est. average glucose Bld gHb Est-mCnc: 137 mg/dL
Hgb A1c MFr Bld: 6.4 % — ABNORMAL HIGH (ref 4.8–5.6)

## 2020-08-28 ENCOUNTER — Other Ambulatory Visit: Payer: Self-pay

## 2020-08-28 ENCOUNTER — Encounter: Payer: Medicare Other | Admitting: Internal Medicine

## 2020-08-28 DIAGNOSIS — E11622 Type 2 diabetes mellitus with other skin ulcer: Secondary | ICD-10-CM | POA: Diagnosis not present

## 2020-08-28 DIAGNOSIS — L97822 Non-pressure chronic ulcer of other part of left lower leg with fat layer exposed: Secondary | ICD-10-CM | POA: Diagnosis not present

## 2020-08-28 DIAGNOSIS — L97802 Non-pressure chronic ulcer of other part of unspecified lower leg with fat layer exposed: Secondary | ICD-10-CM | POA: Diagnosis not present

## 2020-08-28 DIAGNOSIS — N186 End stage renal disease: Secondary | ICD-10-CM | POA: Diagnosis not present

## 2020-08-28 DIAGNOSIS — T8131XA Disruption of external operation (surgical) wound, not elsewhere classified, initial encounter: Secondary | ICD-10-CM | POA: Diagnosis not present

## 2020-08-28 DIAGNOSIS — I120 Hypertensive chronic kidney disease with stage 5 chronic kidney disease or end stage renal disease: Secondary | ICD-10-CM | POA: Diagnosis not present

## 2020-08-28 DIAGNOSIS — E1122 Type 2 diabetes mellitus with diabetic chronic kidney disease: Secondary | ICD-10-CM | POA: Diagnosis not present

## 2020-08-30 NOTE — Progress Notes (Signed)
[name], [name] ([id number]) Visit Report for 08/28/2020 HPI Details Patient Name: [name]. Date of Service: 08/28/2020 9:00 AM Medical Record Number: [id number] Patient Account Number: [id number] Date of Birth/Sex: [birth year] (75 y.o. M) Treating RN: Primary Care Provider: Otilio Miu Other Clinician: Referring Provider: Otilio Miu Treating Provider/Extender: Tito Dine in Treatment: 4 History of Present Illness HPI Description: 07/25/2020 upon evaluation today patient presents for initial evaluation here in the clinic. I did have actually an extensive conversation with [facility] yesterday including the patient's physician who performed the surgery. Unfortunately this patient sustained a chainsaw injury on April 12 when he was working. He states he was cutting a limb when he saw the limb was likely going to fall but did not think that it would come his way. When it did it actually hit his arm and then knocked his arm down and he as he was holding a chainsaw this went into his leg on the lateral portion of his left knee. Subsequently he ended up going to urgent care which led to the orthopedic office which led to him going into surgery for a multilayer closure. There is still a small area that is open unfortunately that could not be completely closed and the lower portion of the wound laterally. The patient fortunately is not having a significant amount of pain he does have a little bit of hematoma here it is going to be cleaned out. He did have a temporary wound VAC in place though again this does not appear to likely be necessary at this point based on what I am seeing her at least this version of the [facility] is not going to be the best way to go. The patient does have diabetes mellitus type 2 and he has a hemoglobin A1c of 6.9 on most recent check. He is also currently on clindamycin and has an ABI of 1.18 on this side. His blood sugar this morning was 95. He keeps  a close eye on this. Otherwise the patient does have a history of hypertension, chronic kidney disease stage III, and beta thalassemia. Of note when I spoke with the patient's surgeon yesterday at [facility] he stated that this is a patient whom they would not be following he wanted to turn care over to [place]. I am definitely okay with that I just wanted to make sure that since the patient was in a postop global that there was not anything they still wanted to monitor but again the care is pretty much been turned over to [place] after that conversation which is why did work to get the patient in today so that we can give appropriate home health orders for the patient as there did not appear to be any direction that was being covered as far as providing direct orders to home health aide which is what ever the hospital case manager and recommended according to what the physician told me. Nonetheless I think that we can probably get some better orders back to home health and get them out to see the patient of performing the appropriate dressing changes. 08/08/2020 upon evaluation today patient appears to be doing excellent in regard to his knee ulcer. I think this is doing quite well although I think there may be over packing into the area of undermining currently. We can address that at this point. Otherwise the patient seems to be doing quite well. 08/14/2020 upon evaluation today patient appears to be doing well with regard to his knee. Has  been tolerating the dressing changes without complication. Fortunately there is no signs of infection and I think he is managing quite nicely here. The packing seems to have gone much better over the past week which I am very pleased in regard to. 08/21/2020 upon evaluation today patient appears to be doing well with regard to his wound. He has been tolerating the dressing changes without complication. Fortunately there is no signs of active infection which is great news. I  do think that we need to get this area to reattach and at this point I think collagen may be the best way to go about doing that. The other option will be removing the flap of skin overlying this area since there seems to be a little bit of epiboly occurring along the edge. Nonetheless I would prefer to get this to try to reattach and seal up but if not then we can always address that secondarily. 5/23; the wound is measuring considerably smaller. He still has an area from about 11-4 o'clock with 0.4 cm of undermining where the over hanging skin is not attached however the rest of this looks quite good. We have been using silver collagen Electronic Signature(s) Signed: 08/28/2020 3:42:13 PM By: Linton Ham MD Entered By: Linton Ham on 08/28/2020 09:58:00 Volkov, [name] ([id number]) -------------------------------------------------------------------------------- Physical Exam Details Patient Name: [name]. Date of Service: 08/28/2020 9:00 AM Medical Record Number: [id number] Patient Account Number: [id number] Date of Birth/Sex: 06-18-1945 (75 y.o. M) Treating RN: Primary Care Provider: Otilio Miu Other Clinician: Referring Provider: Otilio Miu Treating Provider/Extender: Ricard Dillon Weeks in Treatment: 4 Constitutional Sitting or standing Blood Pressure is within target range for patient.. Pulse regular and within target range for patient.Marland Kitchen Respirations regular, non- labored and within target range.. Temperature is normal and within the target range for the patient.Marland Kitchen appears in no distress. Notes Wound exam; the patient's wound bed looks healthy with healthy granulation and the dimensions of come down nicely. As mentioned he does have an undermining area here. No purulent drainage no evidence of infection here. No palpable tenderness Electronic Signature(s) Signed: 08/28/2020 3:42:13 PM By: Linton Ham MD Entered By: Linton Ham on 08/28/2020  09:58:49 Tew, [name] ([id number]) -------------------------------------------------------------------------------- Physician Orders Details Patient Name: [name]. Date of Service: 08/28/2020 9:00 AM Medical Record Number: [id number] Patient Account Number: [id number] Date of Birth/Sex: March 03, 1946 (75 y.o. M) Treating RN: Cornell Barman Primary Care Provider: Otilio Miu Other Clinician: Referring Provider: Otilio Miu Treating Provider/Extender: Tito Dine in Treatment: 4 Verbal / Phone Orders: No Diagnosis Coding Follow-up Appointments o Return Appointment in 1 week. Home Health o DISCONTINUE Home Health for Wound Care. Bathing/ Shower/ Hygiene o Clean wound with Normal Saline or wound cleanser. o No tub bath. Wound Treatment Wound #1 - Knee Wound Laterality: Left, Lateral Cleanser: Normal Saline 3 x Per Week/30 Days Discharge Instructions: Wash your hands with soap and water. Remove old dressing, discard into plastic bag and place into trash. Cleanse the wound with Normal Saline prior to applying a clean dressing using gauze sponges, not tissues or cotton balls. Do not scrub or use excessive force. Pat dry using gauze sponges, not tissue or cotton balls. Primary Dressing: Prisma 4.34 (in) 3 x Per Week/30 Days Discharge Instructions: -PACK INTO WOUND TUNNEL Moisten w/normal saline or sterile water; Cover wound as directed. Do not remove from wound bed. Secondary Dressing: ABD Pad 5x9 (in/in) 3 x Per Week/30 Days Discharge Instructions: Cover with ABD pad Secondary Dressing:  Kerlix 4.5 x 4.1 (in/yd) 3 x Per Week/30 Days Discharge Instructions: Apply Kerlix 4.5 x 4.1 (in/yd) as instructed Secured With: 43M ACE Elastic Bandage With VELCRO Brand Closure, 4 (in) 3 x Per Week/30 Days Discharge Instructions: Secure dressing Electronic Signature(s) Signed: 08/28/2020 3:42:13 PM By: Linton Ham MD Signed: 08/29/2020 5:46:47 PM By: Gretta Cool, BSN, RN,  CWS, Kim RN, BSN Entered By: Gretta Cool, BSN, RN, CWS, Kim on 08/28/2020 09:43:19 Payes, [name] ([id number]) -------------------------------------------------------------------------------- Problem List Details Patient Name: KIMI, KROFT. Date of Service: 08/28/2020 9:00 AM Medical Record Number: [id number] Patient Account Number: [id number] Date of Birth/Sex: 1945-11-17 (75 y.o. 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 Date of Service: 08/28/2020 9:00 AM Medical Record Number: [id number] Patient Account Number: [id number] Date of Birth/Sex: 03/24/1946 (75 y.o. M) Treating RN: Primary Care Provider: Otilio Miu Other Clinician: Referring Provider: Otilio Miu Treating Provider/Extender: Ricard Dillon Weeks in Treatment: 4 Subjective History of Present Illness (HPI) 07/25/2020 upon evaluation today patient presents for initial evaluation here in the clinic. I did have actually  an extensive conversation with [facility] yesterday including the patient's physician who performed the surgery. Unfortunately this patient sustained a chainsaw injury on April 12 when he was working. He states he was cutting a limb when he saw the limb was likely going to fall but did not think that it would come his way. When it did it actually hit his arm and then knocked his arm down and he as he was holding a chainsaw this went into his leg on the lateral portion of his left knee. Subsequently he ended up going to urgent care which led to the orthopedic office which led to him going into surgery for a multilayer closure. There is still a small area that is open unfortunately that could not be completely closed and the lower portion of the wound laterally. The patient fortunately is not having a significant amount of pain he does have a little bit of hematoma here it is going to be cleaned out. He did have a temporary wound VAC in place though again this does not appear to likely be necessary at this point based on what I am seeing her at least this version of the [facility] is not going to be the best way to go. The patient does have diabetes mellitus type 2 and he has a hemoglobin A1c of 6.9 on most recent check. He is also currently on clindamycin and has an ABI of 1.18 on this side. His blood sugar this morning was 95. He keeps a close eye on this. Otherwise the patient does have a history of hypertension, chronic kidney disease stage III, and beta thalassemia. Of note when I spoke with the patient's surgeon yesterday at [facility] he  stated that this is a patient whom they would not be following he wanted to turn care over to [place]. I am definitely okay with that I just wanted to make sure that since the patient was in a postop global that there was not anything they still wanted to monitor but again the care is pretty much been turned over to [place] after that conversation which is why did work to get  the patient in today so that we can give appropriate home health orders for the patient as there did not appear to be any direction that was being covered as far as providing direct orders to home health aide which is what ever the hospital case manager and recommended according to what the physician told me. Nonetheless I think that we can probably get some better orders back to home health and get them out to see the patient of performing the appropriate dressing changes. 08/08/2020 upon evaluation today patient appears to be doing excellent in regard to his knee ulcer. I think this is doing quite well although I think there may be over packing into the area of undermining currently. We can address that at this point. Otherwise the patient seems to be doing quite well. 08/14/2020 upon evaluation today patient appears to be doing well with regard to his knee. Has been tolerating the dressing changes without complication. Fortunately there is no signs of infection and I think he is managing quite nicely here. The packing seems to have gone much better over the past week which I am very pleased in regard to. 08/21/2020 upon evaluation today patient appears to be doing well with regard to his wound. He has been tolerating the dressing changes without complication. Fortunately there is no signs of active infection which is great news. I do think that we need to get this area to reattach and at this point I think collagen may be the best way to go about doing that. The other option will be removing the flap of skin overlying this area since there seems to be a little bit of epiboly occurring along the edge. Nonetheless I would prefer to get this to try to reattach and seal up but if not then we can always address that secondarily. 5/23; the wound is measuring considerably smaller. He still has an area from about 11-4 o'clock with 0.4 cm of undermining where the over hanging skin is not attached however the  rest of this looks quite good. We have been using silver collagen Objective Constitutional Sitting or standing Blood Pressure is within target range for patient.. Pulse regular and within target range for patient.Marland Kitchen Respirations regular, non- labored and within target range.. Temperature is normal and within the target range for the patient.Marland Kitchen appears in no distress. Vitals Time Taken: 9:05 AM, Height: 70 in, Weight: 190 lbs, BMI: 27.3, Temperature: 97.7 F, Pulse: 60 bpm, Respiratory Rate: 16 breaths/min, Blood Pressure: 112/69 mmHg. General Notes: Wound exam; the patient's wound bed looks healthy with healthy granulation and the dimensions of come down nicely. As mentioned he does have an undermining area here. No purulent drainage no evidence of infection here. No palpable tenderness Integumentary (Hair, Skin) Wound #1 status is Open. Original cause of wound was Trauma. The date acquired was: 07/18/2020. The wound has been in treatment 4 weeks. The wound is located on the Left,Lateral Knee. The wound measures 0.4cm length x 0.5cm width x 0.2cm depth; 0.157cm^2 area and 0.031cm^3 volume. There is Fat Layer (Subcutaneous Tissue) exposed.  There is no tunneling noted, however, there is undermining starting at AREN, PRYDE T. ([id number]) 11:00 and ending at 3:00 with a maximum distance of 0.4cm. There is a small amount of serous drainage noted. There is large (67-100%) pink granulation within the wound bed. There is no necrotic tissue within the wound bed. 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 Home  Health: DISCONTINUE Home Health for Wound Care. Bathing/ Shower/ Hygiene: Clean wound with Normal Saline or wound cleanser. No tub bath. WOUND #1: - Knee Wound Laterality: Left, Lateral Cleanser: Normal Saline 3 x Per Week/30 Days Discharge Instructions: Wash your hands with soap and water. Remove old dressing, discard into plastic bag and place into trash. Cleanse the wound with Normal Saline prior to applying a clean dressing using gauze sponges, not tissues or cotton balls. Do not scrub or use excessive force. Pat dry using gauze sponges, not tissue or cotton balls. Primary Dressing: Prisma 4.34 (in) 3 x Per Week/30 Days Discharge Instructions: -PACK INTO WOUND TUNNEL Moisten w/normal saline or sterile water; Cover wound as directed. Do not remove from wound bed. Secondary Dressing: ABD Pad 5x9 (in/in) 3 x Per Week/30 Days Discharge Instructions: Cover with ABD pad Secondary Dressing: Kerlix 4.5 x 4.1 (in/yd) 3 x Per Week/30 Days Discharge Instructions: Apply Kerlix 4.5 x 4.1 (in/yd) as instructed Secured With: 14M ACE Elastic Bandage With VELCRO Brand Closure, 4 (in) 3 x Per Week/30 Days Discharge Instructions: Secure dressing #1 we have continued with the collagen. Hopefully the undermining area will not prevent overall closure of this wound however will need to be monitored. Electronic Signature(s) Signed: 08/28/2020 3:42:13 PM By: Linton Ham MD Entered By: Linton Ham on 08/28/2020 09:59:34 Kellison, [name] ([id number]) -------------------------------------------------------------------------------- [place] Details Patient Name: [name]. Date of Service: 08/28/2020 Medical Record Number: [id number] Patient Account Number: [id number] Date of Birth/Sex: Feb 10, 1946 (75 y.o. 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 E11.622 Type 2 diabetes mellitus with other skin ulcer I10 Essential (primary) hypertension N18.30 Chronic kidney disease, stage 3 unspecified D56.1 Beta thalassemia Facility Procedures CPT4 Code: [id number] Description: 99213 - WOUND CARE VISIT-LEV 3 EST PT Modifier: Quantity: 1 Physician Procedures CPT4 Code: 4967591 Description: 99213 - WC PHYS LEVEL 3 - EST PT Modifier: Quantity: 1 CPT4 Code: Description: ICD-10 Diagnosis Description T81.31XA Disruption of external operation (surgical) wound, not elsewhere classifie 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

## 2020-08-31 NOTE — Progress Notes (Signed)
MACLIN, GUERRETTE ([id number]) Visit Report for 08/28/2020 Arrival Information Details Patient Name: [name], [name]. Date of Service: 08/28/2020 9:00 AM Medical Record Number: [id number] Patient Account Number: [id number] Date of Birth/Sex: February 05, 1946 (75 y.o. M) Treating RN: Primary Care Jaqueline Uber: Otilio Miu Other Clinician: Referring Yeny Schmoll: Otilio Miu Treating Mikaylah Libbey/Extender: Tito Dine in Treatment: 4 Visit Information History Since Last Visit Added or deleted any medications: No Patient Arrived: Ambulatory Had a fall or experienced change in No Arrival Time: 09:05 activities of daily living that may affect Accompanied By: self risk of falls: Transfer Assistance: None Hospitalized since last visit: No Patient Identification Verified: Yes Pain Present Now: No Secondary Verification Process Completed: Yes Patient Has Alerts: Yes Patient Alerts: Patient on Blood Thinner on ASPIRIN DIABETIC Electronic Signature(s) Signed: 08/31/2020 4:37:07 PM By: Jeanine Luz Entered By: Jeanine Luz on 08/28/2020 09:05:29 Mcneill, [name] ([id number]) -------------------------------------------------------------------------------- Clinic Level of Care Assessment Details Patient Name: Robert Dolly T. Date of Service: 08/28/2020 9:00 AM Medical Record Number: [id number] Patient Account Number: [id number] Date of Birth/Sex: 12-Feb-1946 (75 y.o. M) Treating RN: Cornell Barman Primary Care Filippa Yarbough: Otilio Miu Other Clinician: Referring Mell Guia: Otilio Miu Treating Alano Blasco/Extender: Tito Dine in Treatment: 4 Clinic Level of Care Assessment Items TOOL 4 Quantity Score _0  - Use when only an EandM is performed on FOLLOW-UP visit 0 ASSESSMENTS - Nursing Assessment / Reassessment X - Reassessment of Co-morbidities (includes updates in patient status) 1 10 X- 1 5 Reassessment of Adherence to Treatment Plan ASSESSMENTS - Wound and Skin  Assessment / Reassessment X - Simple Wound Assessment / Reassessment - one wound 1 5 _1  - 0 Complex Wound Assessment / Reassessment - multiple wounds _2  - 0 Dermatologic / Skin Assessment (not related to wound area) ASSESSMENTS - Focused Assessment _3  - Circumferential Edema Measurements - multi extremities 0 _4  - 0 Nutritional Assessment / Counseling / Intervention _5  - 0 Lower Extremity Assessment (monofilament, tuning fork, pulses) _6  - 0 Peripheral Arterial Disease Assessment (using hand held doppler) ASSESSMENTS - Ostomy and/or Continence Assessment and Care _7  - Incontinence Assessment and Management 0 _8  - 0 Ostomy Care Assessment and Management (repouching, etc.) PROCESS - Coordination of Care X - Simple Patient / Family Education for ongoing care 1 15 _9  - 0 Complex (extensive) Patient / Family Education for ongoing care X- 1 10 Staff obtains Consents, Records, Test Results / Process Orders _10  - 0 Staff telephones HHA, Nursing Homes / Clarify orders / etc _11  - 0 Routine Transfer to another Facility (non-emergent condition) _12  - 0 Routine Hospital Admission (non-emergent condition) _13  - 0 New Admissions / Biomedical engineer / Ordering NPWT, Apligraf, etc. _14  - 0 Emergency Hospital Admission (emergent condition) X- 1 10 Simple Discharge Coordination _15  - 0 Complex (extensive) Discharge Coordination PROCESS - Special Needs _16  - Pediatric / Minor Patient Management 0 _17  - 0 Isolation Patient Management _18  - 0 Hearing / Language / Visual special needs _19  - 0 Assessment of Community assistance (transportation, D/C planning, etc.) _20  - 0 Additional assistance / Altered mentation _21  - 0 Support Surface(s) Assessment (bed, cushion, seat, etc.) INTERVENTIONS - Wound Cleansing / Measurement Milo, [name] ([id number]) X- 1 5 Simple Wound Cleansing - one wound _22  - 0 Complex Wound Cleansing - multiple wounds X- 1 5 Wound Imaging (photographs - any  number of wounds) _23  - 0 Wound Tracing (instead of photographs) X- 1 5 Simple Wound Measurement - one wound _24  - 0 Complex Wound Measurement - multiple wounds  INTERVENTIONS - Wound Dressings _0  - Small Wound Dressing one or multiple wounds 0 X- 1 15 Medium Wound Dressing one or multiple wounds _1  - 0 Large Wound Dressing one or multiple wounds <ZOXWRUEAVWUJWJXB>_1<\/YNWGNFAOZHYQMVHQ>_4  - 0 Application of Medications - topical <ONGEXBMWUXLKGMWN>_0<\/UVOZDGUYQIHKVQQV>_9  - 0 Application of Medications - injection INTERVENTIONS - Miscellaneous _4  - External ear exam 0 _5  - 0 Specimen Collection (cultures, biopsies, blood, body fluids, etc.) _6  - 0 Specimen(s) / Culture(s) sent or taken to Lab for analysis _7  - 0 Patient Transfer (multiple staff / Civil Service fast streamer / Similar devices) _8  - 0 Simple Staple / Suture removal (25 or less) _9  - 0 Complex Staple / Suture removal (26 or more) _10  - 0 Hypo / Hyperglycemic Management (close monitor of Blood Glucose) _11  - 0 Ankle / Brachial Index (ABI) - do not check if billed separately X- 1 5 Vital Signs Has the patient been seen at the hospital within the last three years: Yes Total Score: 90 Level Of Care: New/Established - Level 3 Electronic Signature(s) Signed: 08/29/2020 5:46:47 PM By: Gretta Cool, BSN, RN, CWS, Kim RN, BSN Entered By: Gretta Cool, BSN, RN, CWS, Kim on 08/28/2020 09:44:04 Gabrielson, [name] ([id number]) -------------------------------------------------------------------------------- Encounter Discharge Information Details Patient Name: Robert Dolly T. Date of Service: 08/28/2020 9:00 AM Medical Record Number: [id number] Patient Account Number: [id number] Date of Birth/Sex: 1945-08-20 (75 y.o. M) Treating RN: Carlene Coria Primary Care Evalena Fujii: Otilio Miu Other Clinician: Referring Airel Magadan: Otilio Miu Treating Jadence Kinlaw/Extender: Tito Dine in Treatment: 4 Encounter Discharge Information Items Discharge Condition: Stable Ambulatory Status: Ambulatory Discharge Destination:  Home Transportation: Private Auto Accompanied By: self Schedule Follow-up Appointment: Yes Clinical Summary of Care: Patient Declined Electronic Signature(s) Signed: 08/28/2020 2:18:56 PM By: Carlene Coria RN Entered By: Carlene Coria on 08/28/2020 09:48:25 Wible, [name] ([id number]) -------------------------------------------------------------------------------- Lower Extremity Assessment Details Patient Name: Robert Dolly T. Date of Service: 08/28/2020 9:00 AM Medical Record Number: [id number] Patient Account Number: [id number] Date of Birth/Sex: 03/25/1946 (75 y.o. M) Treating RN: Primary Care Brindle Leyba: Otilio Miu Other Clinician: Referring Davy Westmoreland: Otilio Miu Treating Jamai Dolce/Extender: Tito Dine in Treatment: 4 Vascular Assessment Pulses: Dorsalis Pedis Palpable: [Left:Yes] Electronic Signature(s) Signed: 08/29/2020 5:46:47 PM By: Gretta Cool, BSN, RN, CWS, Kim RN, BSN Entered By: Gretta Cool, BSN, RN, CWS, Kim on 08/28/2020 09:41:36 Teichert, [name] ([id number]) -------------------------------------------------------------------------------- Multi Wound Chart Details Patient Name: [name]. Date of Service: 08/28/2020 9:00 AM Medical Record Number: [id number] Patient Account Number: [id number] Date of Birth/Sex: 1945-09-09 (75 y.o. M) Treating RN: Cornell Barman Primary Care Bryana Froemming: Otilio Miu Other Clinician: Referring Aniqua Briere: Otilio Miu Treating Nam Vossler/Extender: Tito Dine in Treatment: 4 Vital Signs Height(in): 70 Pulse(bpm): 60 Weight(lbs): 190 Blood Pressure(mmHg): 112/69 Body Mass Index(BMI): 27 Temperature(F): 97.7 Respiratory Rate(breaths/min): 16 Photos: [N/A:N/A] Wound Location: Left, Lateral Knee N/A N/A Wounding Event: Trauma N/A N/A Primary Etiology: Open Surgical Wound N/A N/A Comorbid History: Cataracts, Anemia, Coronary Artery N/A N/A Disease, Hypertension, Type II Diabetes, End Stage Renal  Disease Date Acquired: 07/18/2020 N/A N/A Weeks of Treatment: 4 N/A N/A Wound Status: Open N/A N/A Measurements L x W x D (cm) 0.4x0.5x0.2 N/A N/A Area (cm) : 0.157 N/A N/A Volume (cm) : 0.031 N/A N/A % Reduction in Area: 99.70% N/A N/A % Reduction in Volume: 99.70% N/A N/A Starting Position 1 (o'clock): 11 Ending Position 1 (o'clock): 3 Maximum Distance 1 (cm): 0.4 Undermining: Yes N/A N/A Classification: Full Thickness Without Exposed N/A N/A Support Structures Exudate Amount: Small N/A N/A Exudate Type: Serous N/A N/A  Exudate Color: amber N/A N/A Granulation Amount: Large (67-100%) N/A N/A Granulation Quality: Pink N/A N/A Necrotic Amount: None Present (0%) N/A N/A Exposed Structures: Fat Layer (Subcutaneous Tissue): N/A N/A Yes Fascia: No Tendon: No Muscle: No Joint: No Bone: No Epithelialization: Medium (34-66%) N/A N/A Treatment Notes Wound #1 (Knee) Wound Laterality: Left, Lateral Cleanser Normal Saline NICHOLLAS, PERUSSE ([id number]) Discharge Instruction: Wash your hands with soap and water. Remove old dressing, discard into plastic bag and place into trash. Cleanse the wound with Normal Saline prior to applying a clean dressing using gauze sponges, not tissues or cotton balls. Do not scrub or use excessive force. Pat dry using gauze sponges, not tissue or cotton balls. Peri-Wound Care Topical Primary Dressing Prisma 4.34 (in) Discharge Instruction: -PACK INTO WOUND TUNNEL Moisten w/normal saline or sterile water; Cover wound as directed. Do not remove from wound bed. Secondary Dressing ABD Pad 5x9 (in/in) Discharge Instruction: Cover with ABD pad Kerlix 4.5 x 4.1 (in/yd) Discharge Instruction: Apply Kerlix 4.5 x 4.1 (in/yd) as instructed Secured With 67M ACE Elastic Bandage With VELCRO Brand Closure, 4 (in) Discharge Instruction: Secure dressing Compression Wrap Compression Stockings Add-Ons Electronic Signature(s) Signed: 08/28/2020 3:42:13 PM By:  Linton Ham MD Entered By: Linton Ham on 08/28/2020 09:56:55 Howington, [name] ([id number]) -------------------------------------------------------------------------------- [place] Details Patient Name: [name]. Date of Service: 08/28/2020 9:00 AM Medical Record Number: [id number] Patient Account Number: [id number] Date of Birth/Sex: [birth year] (75 y.o. M) Treating RN: Cornell Barman Primary Care Deryl Ports: Otilio Miu Other Clinician: Referring Almena Hokenson: Otilio Miu Treating Maire Govan/Extender: Tito Dine in Treatment: 4 Active Inactive Wound/Skin Impairment Nursing Diagnoses: Impaired tissue integrity Goals: Patient/caregiver will verbalize understanding of skin care regimen Date Initiated: 07/25/2020 Date Inactivated: 08/14/2020 Target Resolution Date: 07/25/2020 Goal Status: Met Ulcer/skin breakdown will have a volume reduction of 30% by week 4 Date Initiated: 07/25/2020 Date Inactivated: 08/14/2020 Target Resolution Date: 08/24/2020 Goal Status: Met Ulcer/skin breakdown will have a volume reduction of 50% by week 8 Date Initiated: 07/25/2020 Target Resolution Date: 09/24/2020 Goal Status: Active Ulcer/skin breakdown will have a volume reduction of 80% by week 12 Date Initiated: 07/25/2020 Target Resolution Date: 10/24/2020 Goal Status: Active Ulcer/skin breakdown will heal within 14 weeks Date Initiated: 07/25/2020 Target Resolution Date: 11/24/2020 Goal Status: Active Interventions: Assess patient/caregiver ability to obtain necessary supplies Assess patient/caregiver ability to perform ulcer/skin care regimen upon admission and as needed Assess ulceration(s) every visit Provide education on ulcer and skin care Treatment Activities: Referred to DME Sincere Berlanga for dressing supplies : 07/25/2020 Skin care regimen initiated : 07/25/2020 Notes: Electronic Signature(s) Signed: 08/29/2020 5:46:47 PM By: Gretta Cool, BSN, RN, CWS, Kim RN,  BSN Entered By: Gretta Cool, BSN, RN, CWS, Kim on 08/28/2020 09:41:41 Telford, [name] ([id number]) -------------------------------------------------------------------------------- Pain Assessment Details Patient Name: Robert Dolly T. Date of Service: 08/28/2020 9:00 AM Medical Record Number: [id number] Patient Account Number: [id number] Date of Birth/Sex: [birth year] (75 y.o. M) Treating RN: Primary Care Isay Perleberg: Otilio Miu Other Clinician: Referring Desare Duddy: Otilio Miu Treating Thuy Atilano/Extender: Tito Dine in Treatment: 4 Active Problems Location of Pain Severity and Description of Pain Patient Has Paino No Site Locations Rate the pain. Current Pain Level: 0 Pain Management and Medication Current Pain Management: Electronic Signature(s) Signed: 08/31/2020 4:37:07 PM By: Jeanine Luz Entered By: Jeanine Luz on 08/28/2020 09:08:49 Perusse, [name] ([id number]) -------------------------------------------------------------------------------- Patient/Caregiver Education Details Patient Name: [name]. Date of Service: 08/28/2020 9:00 AM Medical Record Number: [id number] Patient Account Number: [id number] Date of Birth/Gender: 03-08-1946 (  75 y.o. M) Treating RN: Cornell Barman Primary Care Physician: Otilio Miu Other Clinician: Referring Physician: Otilio Miu Treating Physician/Extender: Tito Dine in Treatment: 4 Education Assessment Education Provided To: Patient Education Topics Provided Wound/Skin Impairment: Handouts: Caring for Your Ulcer Methods: Demonstration, Explain/Verbal Responses: State content correctly Electronic Signature(s) Signed: 08/29/2020 5:46:47 PM By: Gretta Cool, BSN, RN, CWS, Kim RN, BSN Entered By: Gretta Cool, BSN, RN, CWS, Kim on 08/28/2020 09:44:16 Glidden, [name] ([id number]) -------------------------------------------------------------------------------- Wound Assessment Details Patient Name:  [name]. Date of Service: 08/28/2020 9:00 AM Medical Record Number: [id number] Patient Account Number: [id number] Date of Birth/Sex: Jan 17, 1946 (75 y.o. M) Treating RN: Primary Care Janayla Marik: Otilio Miu Other Clinician: Referring Linn Goetze: Otilio Miu Treating Janelly Switalski/Extender: Ricard Dillon Weeks in Treatment: 4 Wound Status Wound Number: 1 Primary Open Surgical Wound Etiology: Wound Location: Left, Lateral Knee Wound Open Wounding Event: Trauma Status: Date Acquired: 07/18/2020 Notes: WOUND VAC IN PLACE UPON ADMISSION POST Weeks Of Treatment: 4 SURGERY Clustered Wound: No Comorbid Cataracts, Anemia, Coronary Artery Disease, Wound under treatment by Husna Krone outside of [place] History: Hypertension, Type II Diabetes, End Stage Renal Disease Photos Wound Measurements Length: (cm) 0.4 Width: (cm) 0.5 Depth: (cm) 0.2 Area: (cm) 0.157 Volume: (cm) 0.031 % Reduction in Area: 99.7% % Reduction in Volume: 99.7% Epithelialization: Medium (34-66%) Tunneling: No Undermining: Yes Starting Position (o'clock): 11 Ending Position (o'clock): 3 Maximum Distance: (cm) 0.4 Wound Description Classification: Full Thickness Without Exposed Support Structu Exudate Amount: Small Exudate Type: Serous Exudate Color: amber res Foul Odor After Cleansing: No Slough/Fibrino No Wound Bed Granulation Amount: Large (67-100%) Exposed Structure Granulation Quality: Pink Fascia Exposed: No Necrotic Amount: None Present (0%) Fat Layer (Subcutaneous Tissue) Exposed: Yes Tendon Exposed: No Muscle Exposed: No Joint Exposed: No Bone Exposed: No Treatment Notes Wound #1 (Knee) Wound Laterality: Left, Lateral Cleanser Winiecki, [name] ([id number]) Normal Saline Discharge Instruction: Wash your hands with soap and water. Remove old dressing, discard into plastic bag and place into trash. Cleanse the wound with Normal Saline prior to applying a clean dressing using gauze  sponges, not tissues or cotton balls. Do not scrub or use excessive force. Pat dry using gauze sponges, not tissue or cotton balls. Peri-Wound Care Topical Primary Dressing Prisma 4.34 (in) Discharge Instruction: -PACK INTO WOUND TUNNEL Moisten w/normal saline or sterile water; Cover wound as directed. Do not remove from wound bed. Secondary Dressing ABD Pad 5x9 (in/in) Discharge Instruction: Cover with ABD pad Kerlix 4.5 x 4.1 (in/yd) Discharge Instruction: Apply Kerlix 4.5 x 4.1 (in/yd) as instructed Secured With 25M ACE Elastic Bandage With VELCRO Brand Closure, 4 (in) Discharge Instruction: Secure dressing Compression Wrap Compression Stockings Add-Ons Electronic Signature(s) Signed: 08/29/2020 5:46:47 PM By: Gretta Cool, BSN, RN, CWS, Kim RN, BSN Entered By: Gretta Cool, BSN, RN, CWS, Kim on 08/28/2020 09:41:00 Linskey, [name] ([id number]) -------------------------------------------------------------------------------- [place] Details Patient Name: Robert Dolly T. Date of Service: 08/28/2020 9:00 AM Medical Record Number: [id number] Patient Account Number: [id number] Date of Birth/Sex: June 05, 1945 (75 y.o. M) Treating RN: Primary Care Tyqwan Pink: Otilio Miu Other Clinician: Referring Jerric Oyen: Otilio Miu Treating Loxley Cibrian/Extender: Tito Dine in Treatment: 4 Vital Signs Time Taken: 09:05 Temperature (F): 97.7 Height (in): 70 Pulse (bpm): 60 Weight (lbs): 190 Respiratory Rate (breaths/min): 16 Body Mass Index (BMI): 27.3 Blood Pressure (mmHg): 112/69 Reference Range: 80 - 120 mg / dl Electronic Signature(s) Signed: 08/31/2020 4:37:07 PM By: Jeanine Luz Entered By: Jeanine Luz on 08/28/2020 09:08:41

## 2020-09-05 ENCOUNTER — Other Ambulatory Visit: Payer: Self-pay

## 2020-09-06 NOTE — Progress Notes (Signed)
[name] ([id number]) Visit Report for 09/05/2020 Arrival Information Details Patient Name: [name]. Date of Service: 09/05/2020 11:30 AM Medical Record Number: [id number] Patient Account Number: [id number] Date of Birth/Sex: 16-Jan-1946 (75 y.o. M) Treating RN: Dolan Amen Primary Care Pratik Dalziel: Otilio Miu Other Clinician: Referring Nivea Wojdyla: Otilio Miu Treating Jersee Winiarski/Extender: Skipper Cliche in Treatment: 6 Visit Information History Since Last Visit Added or deleted any medications: No Patient Arrived: Ambulatory Had a fall or experienced change in No Arrival Time: 12:14 activities of daily living that may affect Accompanied By: self risk of falls: Transfer Assistance: None Hospitalized since last visit: No Patient Identification Verified: Yes Pain Present Now: No Secondary Verification Process Completed: Yes Patient Has Alerts: Yes Patient Alerts: Patient on Blood Thinner on ASPIRIN DIABETIC Electronic Signature(s) Signed: 09/06/2020 4:53:17 PM By: Jeanine Luz Entered By: Jeanine Luz on 09/05/2020 12:15:01 [name], [name] ([id number]) -------------------------------------------------------------------------------- Clinic Level of Care Assessment Details Patient Name: Robert Dolly T. Date of Service: 09/05/2020 11:30 AM Medical Record Number: [id number] Patient Account Number: [id number] Date of Birth/Sex: [birth year] (75 y.o. M) Treating RN: Dolan Amen Primary Care Itati Brocksmith: Otilio Miu Other Clinician: Referring Rylei Codispoti: Otilio Miu Treating Berthel Bagnall/Extender: Skipper Cliche in Treatment: 6 Clinic Level of Care Assessment Items TOOL 1 Quantity Score []  - Use when EandM and Procedure is performed on INITIAL visit 0 ASSESSMENTS - Nursing Assessment / Reassessment []  - General Physical Exam (combine w/ comprehensive assessment (listed just below) when performed on new 0 pt. evals) []  - 0 Comprehensive  Assessment (HX, ROS, Risk Assessments, Wounds Hx, etc.) ASSESSMENTS - Wound and Skin Assessment / Reassessment []  - Dermatologic / Skin Assessment (not related to wound area) 0 ASSESSMENTS - Ostomy and/or Continence Assessment and Care []  - Incontinence Assessment and Management 0 []  - 0 Ostomy Care Assessment and Management (repouching, etc.) PROCESS - Coordination of Care []  - Simple Patient / Family Education for ongoing care 0 []  - 0 Complex (extensive) Patient / Family Education for ongoing care []  - 0 Staff obtains Programmer, systems, Records, Test Results / Process Orders []  - 0 Staff telephones HHA, Nursing Homes / Clarify orders / etc []  - 0 Routine Transfer to another Facility (non-emergent condition) []  - 0 Routine Hospital Admission (non-emergent condition) []  - 0 New Admissions / Biomedical engineer / Ordering NPWT, Apligraf, etc. []  - 0 Emergency Hospital Admission (emergent condition) PROCESS - Special Needs []  - Pediatric / Minor Patient Management 0 []  - 0 Isolation Patient Management []  - 0 Hearing / Language / Visual special needs []  - 0 Assessment of Community assistance (transportation, D/C planning, etc.) []  - 0 Additional assistance / Altered mentation []  - 0 Support Surface(s) Assessment (bed, cushion, seat, etc.) INTERVENTIONS - Miscellaneous []  - External ear exam 0 []  - 0 Patient Transfer (multiple staff / Civil Service fast streamer / Similar devices) []  - 0 Simple Staple / Suture removal (25 or less) []  - 0 Complex Staple / Suture removal (26 or more) []  - 0 Hypo/Hyperglycemic Management (do not check if billed separately) []  - 0 Ankle / Brachial Index (ABI) - do not check if billed separately Has the patient been seen at the hospital within the last three years: Yes Total Score: 0 Level Of Care: ____ [name] ([id number]) Electronic Signature(s) Signed: 09/06/2020 4:53:17 PM By: Jeanine Luz Entered By: Jeanine Luz on 09/05/2020  12:24:48 [name] ([id number]) -------------------------------------------------------------------------------- Compression Therapy Details Patient Name: Robert Dolly T. Date of Service: 09/05/2020 11:30 AM Medical Record Number: [id number] Patient  Account Number: [id number] Date of Birth/Sex: Jun 18, 1945 (75 y.o. M) Treating RN: Dolan Amen Primary Care Annabelle Rexroad: Otilio Miu Other Clinician: Referring Tyden Kann: Otilio Miu Treating Malanie Koloski/Extender: Jeri Cos Weeks in Treatment: 6 Compression Therapy Performed for Wound Assessment: Wound #1 Left,Lateral Knee Performed By: Clinician Dolan Amen, RN Compression Type: Single Layer Pre Treatment ABI: 1.2 Electronic Signature(s) Signed: 09/06/2020 4:53:17 PM By: Jeanine Luz Entered By: Jeanine Luz on 09/05/2020 12:24:14 Recine, [name] ([id number]) -------------------------------------------------------------------------------- Encounter Discharge Information Details Patient Name: [name]. Date of Service: 09/05/2020 11:30 AM Medical Record Number: [id number] Patient Account Number: [id number] Date of Birth/Sex: 1945/07/21 (75 y.o. M) Treating RN: Dolan Amen Primary Care Tayven Renteria: Otilio Miu Other Clinician: Referring Mayli Covington: Otilio Miu Treating Elliemae Braman/Extender: Skipper Cliche in Treatment: 6 Encounter Discharge Information Items Discharge Condition: Stable Ambulatory Status: Ambulatory Discharge Destination: Home Transportation: Private Auto Accompanied By: self Schedule Follow-up Appointment: Yes Clinical Summary of Care: Electronic Signature(s) Signed: 09/06/2020 4:53:17 PM By: Jeanine Luz Entered By: Jeanine Luz on 09/05/2020 12:24:42 Defreitas, [name] ([id number]) -------------------------------------------------------------------------------- Wound Assessment Details Patient Name: Robert Dolly T. Date of Service: 09/05/2020 11:30 AM Medical Record  Number: [id number] Patient Account Number: [id number] Date of Birth/Sex: [birth year] (75 y.o. M) Treating RN: Dolan Amen Primary Care Mashonda Broski: Otilio Miu Other Clinician: Referring Jilliam Bellmore: Otilio Miu Treating Julia Alkhatib/Extender: Jeri Cos Weeks in Treatment: 6 Wound Status Wound Number: 1 Primary Open Surgical Wound Etiology: Wound Location: Left, Lateral Knee Wound Open Wounding Event: Trauma Status: Date Acquired: 07/18/2020 Notes: WOUND VAC IN PLACE UPON ADMISSION POST Weeks Of Treatment: 6 SURGERY Clustered Wound: No Comorbid Cataracts, Anemia, Coronary Artery Disease, Wound under treatment by [facility] outside of [place] History: Hypertension, Type II Diabetes, End Stage Renal Disease Wound Measurements Length: (cm) 0.4 Width: (cm) 0.5 Depth: (cm) 0.2 Area: (cm) 0.157 Volume: (cm) 0.031 % Reduction in Area: 99.7% % Reduction in Volume: 99.7% Epithelialization: Medium (34-66%) Wound Description Classification: Full Thickness Without Exposed Support Structures Exudate Amount: Small Exudate Type: Serous Exudate Color: amber Foul Odor After Cleansing: No Slough/Fibrino No Wound Bed Granulation Amount: Large (67-100%) Exposed Structure Granulation Quality: Pink Fascia Exposed: No Necrotic Amount: None Present (0%) Fat Layer (Subcutaneous Tissue) Exposed: Yes Tendon Exposed: No Muscle Exposed: No Joint Exposed: No Bone Exposed: No Treatment Notes Wound #1 (Knee) Wound Laterality: Left, Lateral Cleanser Normal Saline Discharge Instruction: Wash your hands with soap and water. Remove old dressing, discard into plastic bag and place into trash. Cleanse the wound with Normal Saline prior to applying a clean dressing using gauze sponges, not tissues or cotton balls. Do not scrub or use excessive force. Pat dry using gauze sponges, not tissue or cotton balls. Peri-Wound Care Topical Primary Dressing Prisma 4.34 (in) Discharge Instruction: -PACK INTO  WOUND TUNNEL Moisten w/normal saline or sterile water; Cover wound as directed. Do not remove from wound bed. Secondary Dressing ABD Pad 5x9 (in/in) Discharge Instruction: Cover with ABD pad Kerlix 4.5 x 4.1 (in/yd) Yusupov, [name] ([id number]) Discharge Instruction: Apply Kerlix 4.5 x 4.1 (in/yd) as instructed Secured With 55M ACE Elastic Bandage With VELCRO Brand Closure, 4 (in) Discharge Instruction: Secure dressing Compression Wrap Compression Stockings Add-Ons Electronic Signature(s) Signed: 09/05/2020 4:43:03 PM By: Charlett Nose RN Signed: 09/06/2020 4:53:17 PM By: Jeanine Luz Entered By: Jeanine Luz on 09/05/2020 12:15:18

## 2020-09-11 ENCOUNTER — Encounter: Payer: Medicare Other | Attending: Physician Assistant | Admitting: Physician Assistant

## 2020-09-11 ENCOUNTER — Other Ambulatory Visit: Payer: Self-pay

## 2020-09-11 DIAGNOSIS — L97829 Non-pressure chronic ulcer of other part of left lower leg with unspecified severity: Secondary | ICD-10-CM | POA: Diagnosis present

## 2020-09-11 DIAGNOSIS — S81012D Laceration without foreign body, left knee, subsequent encounter: Secondary | ICD-10-CM | POA: Insufficient documentation

## 2020-09-11 DIAGNOSIS — T8131XA Disruption of external operation (surgical) wound, not elsewhere classified, initial encounter: Secondary | ICD-10-CM | POA: Diagnosis not present

## 2020-09-11 DIAGNOSIS — D561 Beta thalassemia: Secondary | ICD-10-CM | POA: Diagnosis not present

## 2020-09-11 DIAGNOSIS — I129 Hypertensive chronic kidney disease with stage 1 through stage 4 chronic kidney disease, or unspecified chronic kidney disease: Secondary | ICD-10-CM | POA: Insufficient documentation

## 2020-09-11 DIAGNOSIS — N183 Chronic kidney disease, stage 3 unspecified: Secondary | ICD-10-CM | POA: Diagnosis not present

## 2020-09-11 DIAGNOSIS — E11622 Type 2 diabetes mellitus with other skin ulcer: Secondary | ICD-10-CM | POA: Insufficient documentation

## 2020-09-11 DIAGNOSIS — E1122 Type 2 diabetes mellitus with diabetic chronic kidney disease: Secondary | ICD-10-CM | POA: Diagnosis not present

## 2020-09-11 DIAGNOSIS — L97822 Non-pressure chronic ulcer of other part of left lower leg with fat layer exposed: Secondary | ICD-10-CM | POA: Diagnosis not present

## 2020-09-11 NOTE — Progress Notes (Addendum)
Robert, Harmon ([id number]) Visit Report for 09/11/2020 Chief Complaint Document Details Patient Name: [name], [name]. Date of Service: 09/11/2020 9:00 AM Medical Record Number: [id number] Patient Account Number: [id number] Date of Birth/Sex: Nov 25, 1945 (74 y.o. M) Treating RN: [name] Primary Care Provider: Otilio Harmon Other Clinician: Referring Provider: Otilio Harmon Treating Provider/Extender: [name] in Treatment: 6 Information Obtained from: Patient Chief Complaint Left Knee Ulcer Electronic Signature(s) Signed: 09/11/2020 9:04:15 AM By: Robert Keeler PA-C Entered By: [name] on 09/11/2020 09:04:15 [name] ([id number]) -------------------------------------------------------------------------------- HPI Details Patient Name: [name]. Date of Service: 09/11/2020 9:00 AM Medical Record Number: [id number] Patient Account Number: [id number] Date of Birth/Sex: Nov 09, 1945 (74 y.o. M) Treating RN: [name] Primary Care Provider: Otilio Harmon Other Clinician: Referring Provider: Otilio Harmon Treating Provider/Extender: [name] in Treatment: 6 History of Present Illness HPI Description: 07/25/2020 upon evaluation today patient presents for initial evaluation here in the clinic. I did have actually an extensive conversation with [facility] yesterday including the patient's physician who performed the surgery. Unfortunately this patient sustained a chainsaw injury on April 12 when he was working. He states he was cutting a limb when he saw the limb was likely going to fall but did not think that it would come his way. When it did it actually hit his arm and then knocked his arm down and he as he was holding a chainsaw this went into his leg on the lateral portion of his left knee. Subsequently he ended up going to urgent care which led to the orthopedic office which led to him going into surgery for a multilayer closure. There  is still a small area that is open unfortunately that could not be completely closed and the lower portion of the wound laterally. The patient fortunately is not having a significant amount of pain he does have a little bit of hematoma here it is going to be cleaned out. He did have a temporary wound VAC in place though again this does not appear to likely be necessary at this point based on what I am seeing her at least this version of the [facility] is not going to be the best way to go. The patient does have diabetes mellitus type 2 and he has a hemoglobin A1c of 6.9 on most recent check. He is also currently on clindamycin and has an ABI of 1.18 on this side. His blood sugar this morning was 95. He keeps a close eye on this. Otherwise the patient does have a history of hypertension, chronic kidney disease stage III, and beta thalassemia. Of note when I spoke with the patient's surgeon yesterday at [facility] he stated that this is a patient whom they would not be following he wanted to turn care over to [place]. I am definitely okay with that I just wanted to make sure that since the patient was in a postop global that there was not anything they still wanted to monitor but again the care is pretty much been turned over to [place] after that conversation which is why did work to get the patient in today so that we can give appropriate home health orders for the patient as there did not appear to be any direction that was being covered as far as providing direct orders to home health aide which is what ever the hospital case manager and recommended according to what the physician told me. Nonetheless I think that we can probably get some better orders  back to home health and get them out to see the patient of performing the appropriate dressing changes. 08/08/2020 upon evaluation today patient appears to be doing excellent in regard to his knee ulcer. I think this is doing quite well although I think there may be  over packing into the area of undermining currently. We can address that at this point. Otherwise the patient seems to be doing quite well. 08/14/2020 upon evaluation today patient appears to be doing well with regard to his knee. Has been tolerating the dressing changes without complication. Fortunately there is no signs of infection and I think he is managing quite nicely here. The packing seems to have gone much better over the past week which I am very pleased in regard to. 08/21/2020 upon evaluation today patient appears to be doing well with regard to his wound. He has been tolerating the dressing changes without complication. Fortunately there is no signs of active infection which is great news. I do think that we need to get this area to reattach and at this point I think collagen may be the best way to go about doing that. The other option will be removing the flap of skin overlying this area since there seems to be a little bit of epiboly occurring along the edge. Nonetheless I would prefer to get this to try to reattach and seal up but if not then we can always address that secondarily. 5/23; the wound is measuring considerably smaller. He still has an area from about 11-4 o'clock with 0.4 cm of undermining where the over hanging skin is not attached however the rest of this looks quite good. We have been using silver collagen 09/11/2020 upon evaluation today patient appears to be doing decently well in regard to his wound on the knee. This is made dramatic improvement and overall I am extremely pleased with where things stand today. There does not appear to be any evidence of infection there is no tunneling and everything is very superficial and very close to complete closure at this point. Overall I am extremely pleased with where we stand at this time. No fevers, chills, nausea, vomiting, or diarrhea. Electronic Signature(s) Signed: 09/11/2020 3:47:35 PM By: Robert Keeler PA-C Entered By:  [name] on 09/11/2020 15:47:34 [name] ([id number]) -------------------------------------------------------------------------------- Physical Exam Details Patient Name: [name]. Date of Service: 09/11/2020 9:00 AM Medical Record Number: [id number] Patient Account Number: [id number] Date of Birth/Sex: June 30, 1945 (74 y.o. M) Treating RN: [name] Primary Care Provider: Otilio Harmon Other Clinician: Referring Provider: Otilio Harmon Treating Provider/Extender: Jeri Cos Weeks in Treatment: 6 Constitutional Well-nourished and well-hydrated in no acute distress. Respiratory normal breathing without difficulty. Psychiatric this patient is able to make decisions and demonstrates good insight into disease process. Alert and Oriented x 3. pleasant and cooperative. Notes Upon inspection patient's wound bed actually showed signs of good granulation epithelization at this point. There does not appear to be any evidence of active infection which is great news and overall I am extremely pleased with where we stand currently. The patient likewise is happy that this is doing so well he tells me he is able to move around a little better without pain at this point. Electronic Signature(s) Signed: 09/11/2020 3:47:57 PM By: Robert Keeler PA-C Entered By: [name] on 09/11/2020 15:47:56 Severin, Huntley Harmon ([id number]) -------------------------------------------------------------------------------- Physician Orders Details Patient Name: [name]. Date of Service: 09/11/2020 9:00 AM Medical Record Number: [id number] Patient Account Number:  [id number] Date of Birth/Sex: 1946/02/04 (75 y.o. 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 Bathing/ Shower/ Hygiene o Clean wound with Normal Saline or wound cleanser. o No tub bath. Wound Treatment Wound #1 - Knee Wound Laterality: Left, Lateral Cleanser: Normal Saline 3 x Per Week/30 Days Discharge Instructions: Wash your hands with soap and water. Remove old dressing, discard into plastic bag and place into trash. Cleanse the wound with Normal Saline prior to applying a clean dressing using gauze sponges, not tissues or cotton balls. Do not scrub or use excessive force. Pat dry using gauze sponges, not tissue or cotton balls. Primary Dressing: Prisma 4.34 (in) 3 x Per Week/30 Days Discharge Instructions: -PACK INTO WOUND TUNNEL Moisten w/normal saline or sterile water; Cover wound as directed. Do not remove from wound bed. Secondary Dressing: Mepilex Border Flex, 4x4 (in/in) (DME) (Generic) 3 x Per Week/30 Days Discharge Instructions: Apply to wound as directed. Do not cut. Electronic Signature(s) Signed: 09/11/2020 9:14:20 AM By: [name] Signed: 09/11/2020 4:00:10 PM By: Robert Keeler PA-C Entered By: [name] on 09/11/2020 09:10:41 Gagliano, Huntley Harmon ([id number]) -------------------------------------------------------------------------------- Problem List Details Patient Name: AMIIR, HECKARD. Date of Service: 09/11/2020 9:00 AM Medical Record Number: [id number] Patient Account Number: [id number] Date of Birth/Sex: 12-30-1945 (74 y.o. 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 with fat 07/25/2020 No Yes layer exposed E11.622 Type 2 diabetes mellitus with other skin ulcer 07/25/2020 No Yes I10 Essential (primary) hypertension 07/25/2020 No Yes N18.30 Chronic kidney disease, stage 3 unspecified 07/25/2020 No Yes D56.1 Beta thalassemia 07/25/2020 No Yes Inactive Problems Resolved Problems Electronic Signature(s) Signed: 09/11/2020 9:04:08 AM By: Robert Keeler PA-C Entered By: [name] on 09/11/2020 09:04:07 Winslett, Huntley Harmon ([id number]) -------------------------------------------------------------------------------- Progress Note Details Patient Name: Robert Dolly T. Date of Service: 09/11/2020 9:00 AM Medical Record Number: [id number] Patient Account Number: [id number] Date of Birth/Sex: Jul 27, 1945 (74 y.o. M) Treating RN: [name] Primary Care Provider: Otilio Harmon Other Clinician: Referring Provider: Otilio Harmon Treating Provider/Extender: [name] in Treatment: 6 Subjective Chief Complaint Information obtained from Patient Left Knee Ulcer History of Present Illness (HPI) 07/25/2020 upon evaluation today patient presents for initial evaluation here in the clinic. I did have actually an extensive conversation with [facility] yesterday including the patient's physician who performed the surgery. Unfortunately this patient sustained a chainsaw injury on April 12 when he was working. He states he was cutting a limb when he saw the limb was likely going to fall but did not think that it would come his way. When it did it actually hit his arm and then knocked his arm down and he as he was holding a  chainsaw this  went into his leg on the lateral portion of his left knee. Subsequently he ended up going to urgent care which led to the orthopedic office which led to him going into surgery for a multilayer closure. There is still a small area that is open unfortunately that could not be completely closed and the lower portion of the wound laterally. The patient fortunately is not having a significant amount of pain he does have a little bit of hematoma here it is going to be cleaned out. He did have a temporary wound VAC in place though again this does not appear to likely be necessary at this point based on what I am seeing her at least this version of the [facility] is not going to be the best way to go. The patient does have diabetes mellitus type 2 and he has a hemoglobin A1c of 6.9 on most recent check. He is also currently on clindamycin and has an ABI of 1.18 on this side. His blood sugar this morning was 95. He keeps a close eye on this. Otherwise the patient does have a history of hypertension, chronic kidney disease stage III, and beta thalassemia. Of note when I spoke with the patient's surgeon yesterday at [facility] he stated that this is a patient whom they would not be following he wanted to turn care over to [place]. I am definitely okay with that I just wanted to make sure that since the patient was in a postop global that there was not anything they still wanted to monitor but again the care is pretty much been turned over to [place] after that conversation which is why did work to get the patient in today so that we can give appropriate home health orders for the patient as there did not appear to be any direction that was being covered as far as providing direct orders to home health aide which is what ever the hospital case manager and recommended according to what the physician told me. Nonetheless I think that we can probably get some better orders back to home health and get them out to see  the patient of performing the appropriate dressing changes. 08/08/2020 upon evaluation today patient appears to be doing excellent in regard to his knee ulcer. I think this is doing quite well although I think there may be over packing into the area of undermining currently. We can address that at this point. Otherwise the patient seems to be doing quite well. 08/14/2020 upon evaluation today patient appears to be doing well with regard to his knee. Has been tolerating the dressing changes without complication. Fortunately there is no signs of infection and I think he is managing quite nicely here. The packing seems to have gone much better over the past week which I am very pleased in regard to. 08/21/2020 upon evaluation today patient appears to be doing well with regard to his wound. He has been tolerating the dressing changes without complication. Fortunately there is no signs of active infection which is great news. I do think that we need to get this area to reattach and at this point I think collagen may be the best way to go about doing that. The other option will be removing the flap of skin overlying this area since there seems to be a little bit of epiboly occurring along the edge. Nonetheless I would prefer to get this to try to reattach and seal up but if not then we can always address that secondarily.  5/23; the wound is measuring considerably smaller. He still has an area from about 11-4 o'clock with 0.4 cm of undermining where the over hanging skin is not attached however the rest of this looks quite good. We have been using silver collagen 09/11/2020 upon evaluation today patient appears to be doing decently well in regard to his wound on the knee. This is made dramatic improvement and overall I am extremely pleased with where things stand today. There does not appear to be any evidence of infection there is no tunneling and everything is very superficial and very close to complete closure  at this point. Overall I am extremely pleased with where we stand at this time. No fevers, chills, nausea, vomiting, or diarrhea. Objective Constitutional Well-nourished and well-hydrated in no acute distress. Vitals Time Taken: 8:55 AM, Height: 70 in, Weight: 190 lbs, BMI: 27.3, Temperature: 97.7 F, Pulse: 60 bpm, Respiratory Rate: 18 breaths/min, Blood Pressure: 138/75 mmHg. BURR, SOFFER ([id number]) Respiratory normal breathing without difficulty. Psychiatric this patient is able to make decisions and demonstrates good insight into disease process. Alert and Oriented x 3. pleasant and cooperative. General Notes: Upon inspection patient's wound bed actually showed signs of good granulation epithelization at this point. There does not appear to be any evidence of active infection which is great news and overall I am extremely pleased with where we stand currently. The patient likewise is happy that this is doing so well he tells me he is able to move around a little better without pain at this point. Integumentary (Hair, Skin) Wound #1 status is Open. Original cause of wound was Trauma. The date acquired was: 07/18/2020. The wound has been in treatment 6 weeks. The wound is located on the Left,Lateral Knee. The wound measures 0.7cm length x 0.4cm width x 0.1cm depth; 0.22cm^2 area and 0.022cm^3 volume. There is Fat Layer (Subcutaneous Tissue) exposed. There is no tunneling or undermining noted. There is a small amount of serous drainage noted. There is large (67-100%) red granulation within the wound bed. There is no necrotic tissue within the wound bed. 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 Bathing/ Shower/ Hygiene: Clean wound with Normal Saline or wound cleanser. No tub bath. WOUND #1: - Knee Wound Laterality: Left, Lateral Cleanser: Normal Saline 3 x Per Week/30 Days Discharge Instructions: Wash your hands with soap and water. Remove old dressing, discard into plastic bag and place into trash. Cleanse the wound with Normal Saline prior to applying a clean dressing using gauze sponges, not tissues or cotton balls. Do not scrub or use excessive force. Pat dry using gauze sponges, not tissue or cotton balls. Primary Dressing: Prisma 4.34 (in) 3 x Per Week/30 Days Discharge Instructions: -PACK INTO WOUND TUNNEL Moisten w/normal saline or sterile water; Cover wound as directed. Do not remove from wound bed. Secondary Dressing: Mepilex Border Flex, 4x4 (in/in) (DME) (Generic) 3 x Per Week/30 Days Discharge Instructions: Apply to wound as directed. Do not cut. 1. Would recommend currently that we going continue with the wound care measures as before and the patient is in agreement with the plan. This includes the use of the silver collagen just superficially now at this point followed by the border  foam dressing. 2. I am also going to recommend at this time that we have the patient continue to monitor for any signs of worsening infection in general. Right now I do not see anything that appears to be infected at all which is great news. Nonetheless would not keep a close eye on things as we hopefully wrap this up in the next couple of weeks. We will see patient back for reevaluation in 1 week here in the clinic. If anything worsens or changes patient will contact our office for additional recommendations. Electronic Signature(s) Signed: 09/11/2020 3:54:18 PM By: Robert Keeler PA-C Entered By: [name] on 09/11/2020 15:54:18 Kovacich, Huntley Harmon  ([id number]) -------------------------------------------------------------------------------- SuperBill Details Patient Name: [name]. Date of Service: 09/11/2020 Medical Record Number: [id number] Patient Account Number: [id number] Date of Birth/Sex: 1945/10/14 (76 y.o. 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 E11.622 Type 2 diabetes mellitus with other skin ulcer I10 Essential (primary) hypertension N18.30 Chronic kidney disease, stage 3 unspecified D56.1 Beta thalassemia Facility Procedures CPT4 Code: [id number] Description: [phone] - WOUND CARE VISIT-LEV 2 EST PT Modifier: Quantity: 1 Physician Procedures CPT4 Code: [id number] Description: 99213 - WC PHYS LEVEL 3 - EST PT Modifier: Quantity: 1 CPT4 Code: Description: ICD-10 Diagnosis Description T81.31XA Disruption of external operation (surgical) wound, not elsewhere classifie L97.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

## 2020-09-12 NOTE — Progress Notes (Signed)
[name] ([id number]) Visit Report for 09/11/2020 Arrival Information Details Patient Name: [name], [name]. Date of Service: 09/11/2020 9:00 AM Medical Record Number: [id number] Patient Account Number: [id number] Date of Birth/Sex: September 28, 1945 (75 y.o. M) Treating RN: [name] Primary Care [name]: [name] Other Clinician: Referring [name]: [name] Treating Rene Gonsoulin/Extender: Skipper Harmon in Treatment: 6 Visit Information History Since Last Visit Added or deleted any medications: No Patient Arrived: Ambulatory Had a fall or experienced change in No Arrival Time: 08:53 activities of daily living that may affect Accompanied By: self risk of falls: Transfer Assistance: None Hospitalized since last visit: No Patient Identification Verified: Yes Pain Present Now: No Secondary Verification Process Completed: Yes Patient Has Alerts: Yes Patient Alerts: Patient on Blood Thinner on ASPIRIN DIABETIC Electronic Signature(s) Signed: 09/11/2020 3:36:07 PM By: [name] Entered By: [name] on 09/11/2020 09:00:04 [name] ([id number]) -------------------------------------------------------------------------------- Clinic Level of Care Assessment Details Patient Name: Robert Dolly T. Date of Service: 09/11/2020 9:00 AM Medical Record Number: [id number] Patient Account Number: [id number] Date of Birth/Sex: [birth year] (75 y.o. M) Treating RN: [name] Primary Care Mcdonald Reiling: [name] Other Clinician: Referring [name]: [name] Treating [name] in Treatment: 6 Clinic Level of Care Assessment Items TOOL 4 Quantity Score '[]'  - Use when only an EandM is performed on FOLLOW-UP visit 0 ASSESSMENTS - Nursing Assessment / Reassessment '[]'  - Reassessment of Co-morbidities (includes updates in patient status) 0 '[]'  - 0 Reassessment of Adherence to Treatment Plan ASSESSMENTS - Wound and Skin Assessment  / Reassessment X - Simple Wound Assessment / Reassessment - one wound 1 5 '[]'  - 0 Complex Wound Assessment / Reassessment - multiple wounds '[]'  - 0 Dermatologic / Skin Assessment (not related to wound area) ASSESSMENTS - Focused Assessment '[]'  - Circumferential Edema Measurements - multi extremities 0 '[]'  - 0 Nutritional Assessment / Counseling / Intervention '[]'  - 0 Lower Extremity Assessment (monofilament, tuning fork, pulses) '[]'  - 0 Peripheral Arterial Disease Assessment (using hand held doppler) ASSESSMENTS - Ostomy and/or Continence Assessment and Care '[]'  - Incontinence Assessment and Management 0 '[]'  - 0 Ostomy Care Assessment and Management (repouching, etc.) PROCESS - Coordination of Care X - Simple Patient / Family Education for ongoing care 1 15 '[]'  - 0 Complex (extensive) Patient / Family Education for ongoing care '[]'  - 0 Staff obtains Programmer, systems, Records, Test Results / Process Orders '[]'  - 0 Staff telephones HHA, Nursing Homes / Clarify orders / etc '[]'  - 0 Routine Transfer to another Facility (non-emergent condition) '[]'  - 0 Routine Hospital Admission (non-emergent condition) '[]'  - 0 New Admissions / Biomedical engineer / Ordering NPWT, Apligraf, etc. '[]'  - 0 Emergency Hospital Admission (emergent condition) X- 1 10 Simple Discharge Coordination '[]'  - 0 Complex (extensive) Discharge Coordination PROCESS - Special Needs '[]'  - Pediatric / Minor Patient Management 0 '[]'  - 0 Isolation Patient Management '[]'  - 0 Hearing / Language / Visual special needs '[]'  - 0 Assessment of Community assistance (transportation, D/C planning, etc.) '[]'  - 0 Additional assistance / Altered mentation '[]'  - 0 Support Surface(s) Assessment (bed, cushion, seat, etc.) INTERVENTIONS - Wound Cleansing / Measurement [name] ([id number]) X- 1 5 Simple Wound Cleansing - one wound '[]'  - 0 Complex Wound Cleansing - multiple wounds '[]'  - 0 Wound Imaging (photographs - any number of  wounds) '[]'  - 0 Wound Tracing (instead of photographs) X- 1 5 Simple Wound Measurement - one wound '[]'  - 0 Complex Wound Measurement - multiple wounds INTERVENTIONS -  Wound Dressings X - Small Wound Dressing one or multiple wounds 1 10 '[]'  - 0 Medium Wound Dressing one or multiple wounds '[]'  - 0 Large Wound Dressing one or multiple wounds '[]'  - 0 Application of Medications - topical '[]'  - 0 Application of Medications - injection INTERVENTIONS - Miscellaneous '[]'  - External ear exam 0 '[]'  - 0 Specimen Collection (cultures, biopsies, blood, body fluids, etc.) '[]'  - 0 Specimen(s) / Culture(s) sent or taken to Lab for analysis '[]'  - 0 Patient Transfer (multiple staff / [name] Lift / Similar devices) '[]'  - 0 Simple Staple / Suture removal (25 or less) '[]'  - 0 Complex Staple / Suture removal (26 or more) '[]'  - 0 Hypo / Hyperglycemic Management (close monitor of Blood Glucose) '[]'  - 0 Ankle / Brachial Index (ABI) - do not check if billed separately X- 1 5 Vital Signs Has the patient been seen at the hospital within the last three years: Yes Total Score: 55 Level Of Care: New/Established - Level 2 Electronic Signature(s) Signed: 09/11/2020 9:14:20 AM By: [name] Entered By: [name] on 09/11/2020 09:09:23 [name] ([id number]) -------------------------------------------------------------------------------- Encounter Discharge Information Details Patient Name: Robert Dolly T. Date of Service: 09/11/2020 9:00 AM Medical Record Number: [id number] Patient Account Number: [id number] Date of Birth/Sex: 17-Apr-1945 (75 y.o. M) Treating RN: [name] Primary Care [name]: [name] Other Clinician: Referring [name]: [name] Treating Torian Thoennes/Extender: Skipper Harmon in Treatment: 6 Encounter Discharge Information Items Discharge Condition: Stable Ambulatory Status: Ambulatory Discharge Destination: Home Transportation: Private Auto Accompanied By:  self Schedule Follow-up Appointment: Yes Clinical Summary of Care: Patient Declined Electronic Signature(s) Signed: 09/12/2020 8:01:20 AM By: Robert Coria RN Entered By: [name] on 09/11/2020 09:15:11 Kampe, Huntley Harmon ([id number]) -------------------------------------------------------------------------------- Lower Extremity Assessment Details Patient Name: Robert Dolly T. Date of Service: 09/11/2020 9:00 AM Medical Record Number: [id number] Patient Account Number: [id number] Date of Birth/Sex: 05-Feb-1946 (75 y.o. M) Treating RN: [name] Primary Care Wania Longstreth: [name] Other Clinician: Referring Laxmi Choung: [name] Treating Carroll Ranney/Extender: Jeri Cos Weeks in Treatment: 6 Electronic Signature(s) Signed: 09/11/2020 9:14:20 AM By: [name] Signed: 09/11/2020 3:36:07 PM By: [name] Entered By: [name] on 09/11/2020 09:01:53 Rothgeb, Huntley Harmon ([id number]) -------------------------------------------------------------------------------- Multi Wound Chart Details Patient Name: [name]. Date of Service: 09/11/2020 9:00 AM Medical Record Number: [id number] Patient Account Number: [id number] Date of Birth/Sex: 30-Jun-1945 (75 y.o. M) Treating RN: [name] Primary Care Maleke Feria: [name] Other Clinician: Referring Loring Liskey: [name] Treating Elijha Dedman/Extender: Jeri Cos Weeks in Treatment: 6 Vital Signs Height(in): 70 Pulse(bpm): 60 Weight(lbs): 190 Blood Pressure(mmHg): 138/75 Body Mass Index(BMI): 27 Temperature(F): 97.7 Respiratory Rate(breaths/min): 18 Photos: [N/A:N/A] Wound Location: Left, Lateral Knee N/A N/A Wounding Event: Trauma N/A N/A Primary Etiology: Open Surgical Wound N/A N/A Comorbid History: Cataracts, Anemia, Coronary Artery N/A N/A Disease, Hypertension, Type II Diabetes, End Stage Renal Disease Date Acquired: 07/18/2020 N/A N/A Weeks of Treatment: 6 N/A N/A Wound Status: Open N/A  N/A Measurements L x W x D (cm) 0.7x0.4x0.1 N/A N/A Area (cm) : 0.22 N/A N/A Volume (cm) : 0.022 N/A N/A % Reduction in Area: 99.50% N/A N/A % Reduction in Volume: 99.80% N/A N/A Classification: Full Thickness Without Exposed N/A N/A Support Structures Exudate Amount: Small N/A N/A Exudate Type: Serous N/A N/A Exudate Color: amber N/A N/A Granulation Amount: Large (67-100%) N/A N/A Granulation Quality: Red N/A N/A Necrotic Amount: None Present (0%) N/A N/A Exposed Structures: Fat Layer (Subcutaneous Tissue): N/A N/A Yes Fascia: No Tendon: No Muscle:  No Joint: No Bone: No Epithelialization: Medium (34-66%) N/A N/A Treatment Notes Electronic Signature(s) Signed: 09/11/2020 9:14:20 AM By: [name] Entered By: [name] on 09/11/2020 09:07:13 Bixby, Huntley Harmon ([id number]) -------------------------------------------------------------------------------- [place] Details Patient Name: Robert Dolly T. Date of Service: 09/11/2020 9:00 AM Medical Record Number: [id number] Patient Account Number: [id number] Date of Birth/Sex: 1945-04-25 (75 y.o. M) Treating RN: [name] Primary Care Kairav Russomanno: [name] Other Clinician: Referring Seraphim Affinito: [name] Treating Conrad Zajkowski/Extender: Jeri Cos Weeks in Treatment: 6 Active Inactive Wound/Skin Impairment Nursing Diagnoses: Impaired tissue integrity Goals: Patient/caregiver will verbalize understanding of skin care regimen Date Initiated: 07/25/2020 Date Inactivated: 08/14/2020 Target Resolution Date: 07/25/2020 Goal Status: Met Ulcer/skin breakdown will have a volume reduction of 30% by week 4 Date Initiated: 07/25/2020 Date Inactivated: 08/14/2020 Target Resolution Date: 08/24/2020 Goal Status: Met Ulcer/skin breakdown will have a volume reduction of 50% by week 8 Date Initiated: 07/25/2020 Target Resolution Date: 09/24/2020 Goal Status: Active Ulcer/skin breakdown will have a volume reduction of  80% by week 12 Date Initiated: 07/25/2020 Target Resolution Date: 10/24/2020 Goal Status: Active Ulcer/skin breakdown will heal within 14 weeks Date Initiated: 07/25/2020 Target Resolution Date: 11/24/2020 Goal Status: Active Interventions: Assess patient/caregiver ability to obtain necessary supplies Assess patient/caregiver ability to perform ulcer/skin care regimen upon admission and as needed Assess ulceration(s) every visit Provide education on ulcer and skin care Treatment Activities: Referred to DME Kharter Sestak for dressing supplies : 07/25/2020 Skin care regimen initiated : 07/25/2020 Notes: Electronic Signature(s) Signed: 09/11/2020 9:14:20 AM By: [name] Entered By: [name] on 09/11/2020 09:06:26 Hartin, Huntley Harmon ([id number]) -------------------------------------------------------------------------------- Pain Assessment Details Patient Name: Robert Dolly T. Date of Service: 09/11/2020 9:00 AM Medical Record Number: [id number] Patient Account Number: [id number] Date of Birth/Sex: 03-Harmon-1947 (75 y.o. M) Treating RN: [name] Primary Care Shonda Mandarino: [name] Other Clinician: Referring Calia Napp: [name] Treating Claudean Leavelle/Extender: Skipper Harmon in Treatment: 6 Active Problems Location of Pain Severity and Description of Pain Patient Has Paino No Site Locations Rate the pain. Current Pain Level: 0 Pain Management and Medication Current Pain Management: Electronic Signature(s) Signed: 09/11/2020 9:14:20 AM By: [name] Signed: 09/11/2020 3:36:07 PM By: [name] Entered By: [name] on 09/11/2020 09:00:30 Szczerba, Huntley Harmon ([id number]) -------------------------------------------------------------------------------- Patient/Caregiver Education Details Patient Name: [name]. Date of Service: 09/11/2020 9:00 AM Medical Record Number: [id number] Patient Account Number: [id number] Date of Birth/Gender: [birth year] (75 y.o.  M) Treating RN: [name] Primary Care Physician: [name] Other Clinician: Referring Physician: Otilio Harmon Treating Physician/Extender: Skipper Harmon in Treatment: 6 Education Assessment Education Provided To: Patient Education Topics Provided Basic Hygiene: Wound/Skin Impairment: Methods: Demonstration, Explain/Verbal Responses: State content correctly Electronic Signature(s) Signed: 09/11/2020 9:14:20 AM By: [name] Entered By: [name] on 09/11/2020 09:09:42 Locker, Huntley Harmon ([id number]) -------------------------------------------------------------------------------- Wound Assessment Details Patient Name: [name]. Date of Service: 09/11/2020 9:00 AM Medical Record Number: [id number] Patient Account Number: [id number] Date of Birth/Sex: [birth year] (75 y.o. M) Treating RN: [name] Primary Care Harshitha Fretz: [name] Other Clinician: Referring Syanne Looney: [name] Treating Helen Cuff/Extender: Jeri Cos Weeks in Treatment: 6 Wound Status Wound Number: 1 Primary Open Surgical Wound Etiology: Wound Location: Left, Lateral Knee Wound Open Wounding Event: Trauma Status: Date Acquired: 07/18/2020 Notes: WOUND VAC IN PLACE UPON ADMISSION POST Weeks Of Treatment: 6 SURGERY Clustered Wound: No Comorbid Cataracts, Anemia, Coronary Artery Disease, Wound under treatment by Khiem Gargis outside of [place] History: Hypertension, Type II Diabetes, End Stage Renal Disease Photos Wound Measurements Length: (cm)  0.7 Width: (cm) 0.4 Depth: (cm) 0.1 Area: (cm) 0.22 Volume: (cm) 0.022 % Reduction in Area: 99.5% % Reduction in Volume: 99.8% Epithelialization: Medium (34-66%) Tunneling: No Undermining: No Wound Description Classification: Full Thickness Without Exposed Support Structures Exudate Amount: Small Exudate Type: Serous Exudate Color: amber Foul Odor After Cleansing: No Slough/Fibrino No Wound Bed Granulation Amount: Large  (67-100%) Exposed Structure Granulation Quality: Red Fascia Exposed: No Necrotic Amount: None Present (0%) Fat Layer (Subcutaneous Tissue) Exposed: Yes Tendon Exposed: No Muscle Exposed: No Joint Exposed: No Bone Exposed: No Treatment Notes Wound #1 (Knee) Wound Laterality: Left, Lateral Cleanser Normal Saline Discharge Instruction: Wash your hands with soap and water. Remove old dressing, discard into plastic bag and place into trash. Cleanse the wound with Normal Saline prior to applying a clean dressing using gauze sponges, not tissues or cotton balls. Do not EIDEN, BAGOT T. ([id number]) scrub or use excessive force. Pat dry using gauze sponges, not tissue or cotton balls. Peri-Wound Care Topical Primary Dressing Prisma 4.34 (in) Discharge Instruction: -PACK INTO WOUND TUNNEL Moisten w/normal saline or sterile water; Cover wound as directed. Do not remove from wound bed. Secondary Dressing Mepilex Border Flex, 4x4 (in/in) Discharge Instruction: Apply to wound as directed. Do not cut. Secured With Compression Wrap Compression Stockings Add-Ons Electronic Signature(s) Signed: 09/11/2020 9:14:20 AM By: [name] Signed: 09/11/2020 3:36:07 PM By: [name] Entered By: [name] on 09/11/2020 09:01:19 Force, Huntley Harmon ([id number]) -------------------------------------------------------------------------------- Vitals Details Patient Name: [name]. Date of Service: 09/11/2020 9:00 AM Medical Record Number: [id number] Patient Account Number: [id number] Date of Birth/Sex: 12-Jul-1945 (75 y.o. M) Treating RN: [name] Primary Care Detron Carras: [name] Other Clinician: Referring Herb Schake: [name] Treating Maddilynn Esperanza/Extender: Jeri Cos Weeks in Treatment: 6 Vital Signs Time Taken: 08:55 Temperature (F): 97.7 Height (in): 70 Pulse (bpm): 60 Weight (lbs): 190 Respiratory Rate (breaths/min): 18 Body Mass Index (BMI): 27.3 Blood Pressure  (mmHg): 138/75 Reference Range: 80 - 120 mg / dl Electronic Signature(s) Signed: 09/11/2020 3:36:07 PM By: [name] Entered By: [name] on 09/11/2020 09:00:23

## 2020-09-18 ENCOUNTER — Encounter: Payer: Medicare Other | Admitting: Physician Assistant

## 2020-09-18 ENCOUNTER — Other Ambulatory Visit: Payer: Self-pay

## 2020-09-18 DIAGNOSIS — L97822 Non-pressure chronic ulcer of other part of left lower leg with fat layer exposed: Secondary | ICD-10-CM | POA: Diagnosis not present

## 2020-09-18 DIAGNOSIS — N183 Chronic kidney disease, stage 3 unspecified: Secondary | ICD-10-CM | POA: Diagnosis not present

## 2020-09-18 DIAGNOSIS — L97829 Non-pressure chronic ulcer of other part of left lower leg with unspecified severity: Secondary | ICD-10-CM | POA: Diagnosis not present

## 2020-09-18 DIAGNOSIS — I129 Hypertensive chronic kidney disease with stage 1 through stage 4 chronic kidney disease, or unspecified chronic kidney disease: Secondary | ICD-10-CM | POA: Diagnosis not present

## 2020-09-18 DIAGNOSIS — E11622 Type 2 diabetes mellitus with other skin ulcer: Secondary | ICD-10-CM | POA: Diagnosis not present

## 2020-09-18 DIAGNOSIS — E1122 Type 2 diabetes mellitus with diabetic chronic kidney disease: Secondary | ICD-10-CM | POA: Diagnosis not present

## 2020-09-18 DIAGNOSIS — T8131XA Disruption of external operation (surgical) wound, not elsewhere classified, initial encounter: Secondary | ICD-10-CM | POA: Diagnosis not present

## 2020-09-18 NOTE — Progress Notes (Addendum)
MAYO, FAULK ([id number]) Visit Report for 09/18/2020 Chief Complaint Document Details Patient Name: [name], [name]. Date of Service: 09/18/2020 9:00 AM Medical Record Number: [id number] Patient Account Number: [id number] Date of Birth/Sex: June 27, 1945 (75 y.o. M) Treating RN: Donnamarie Poag Primary Care Provider: Otilio Miu Other Clinician: Referring Provider: Otilio Miu Treating Provider/Extender: Skipper Cliche in Treatment: 7 Information Obtained from: Patient Chief Complaint Left Knee Ulcer Electronic Signature(s) Signed: 09/18/2020 8:43:04 AM By: Worthy Keeler PA-C Entered By: Worthy Keeler on 09/18/2020 08:43:04 [name], [name] ([id number]) -------------------------------------------------------------------------------- HPI Details Patient Name: [name]. Date of Service: 09/18/2020 9:00 AM Medical Record Number: [id number] Patient Account Number: [id number] Date of Birth/Sex: 11/18/1945 (75 y.o. M) Treating RN: Donnamarie Poag Primary Care Provider: Otilio Miu Other Clinician: Referring Provider: Otilio Miu Treating Provider/Extender: Skipper Cliche in Treatment: 7 History of Present Illness HPI Description: 07/25/2020 upon evaluation today patient presents for initial evaluation here in the clinic. I did have actually an extensive conversation with [facility] yesterday including the patient's physician who performed the surgery. Unfortunately this patient sustained a chainsaw injury on April 12 when he was working. He states he was cutting a limb when he saw the limb was likely going to fall but did not think that it would come his way. When it did it actually hit his arm and then knocked his arm down and he as he was holding a chainsaw this went into his leg on the lateral portion of his left knee. Subsequently he ended up going to urgent care which led to the orthopedic office which led to him going into surgery for a multilayer closure.  There is still a small area that is open unfortunately that could not be completely closed and the lower portion of the wound laterally. The patient fortunately is not having a significant amount of pain he does have a little bit of hematoma here it is going to be cleaned out. He did have a temporary wound VAC in place though again this does not appear to likely be necessary at this point based on what I am seeing her at least this version of the [facility] is not going to be the best way to go. The patient does have diabetes mellitus type 2 and he has a hemoglobin A1c of 6.9 on most recent check. He is also currently on clindamycin and has an ABI of 1.18 on this side. His blood sugar this morning was 95. He keeps a close eye on this. Otherwise the patient does have a history of hypertension, chronic kidney disease stage III, and beta thalassemia. Of note when I spoke with the patient's surgeon yesterday at [facility] he stated that this is a patient whom they would not be following he wanted to turn care over to [place]. I am definitely okay with that I just wanted to make sure that since the patient was in a postop global that there was not anything they still wanted to monitor but again the care is pretty much been turned over to [place] after that conversation which is why did work to get the patient in today so that we can give appropriate home health orders for the patient as there did not appear to be any direction that was being covered as far as providing direct orders to home health aide which is what ever the hospital case manager and recommended according to what the physician told me. Nonetheless I think that we can probably get some better orders  back to home health and get them out to see the patient of performing the appropriate dressing changes. 08/08/2020 upon evaluation today patient appears to be doing excellent in regard to his knee ulcer. I think this is doing quite well although I think there may  be over packing into the area of undermining currently. We can address that at this point. Otherwise the patient seems to be doing quite well. 08/14/2020 upon evaluation today patient appears to be doing well with regard to his knee. Has been tolerating the dressing changes without complication. Fortunately there is no signs of infection and I think he is managing quite nicely here. The packing seems to have gone much better over the past week which I am very pleased in regard to. 08/21/2020 upon evaluation today patient appears to be doing well with regard to his wound. He has been tolerating the dressing changes without complication. Fortunately there is no signs of active infection which is great news. I do think that we need to get this area to reattach and at this point I think collagen may be the best way to go about doing that. The other option will be removing the flap of skin overlying this area since there seems to be a little bit of epiboly occurring along the edge. Nonetheless I would prefer to get this to try to reattach and seal up but if not then we can always address that secondarily. 5/23; the wound is measuring considerably smaller. He still has an area from about 11-4 o'clock with 0.4 cm of undermining where the over hanging skin is not attached however the rest of this looks quite good. We have been using silver collagen 09/11/2020 upon evaluation today patient appears to be doing decently well in regard to his wound on the knee. This is made dramatic improvement and overall I am extremely pleased with where things stand today. There does not appear to be any evidence of infection there is no tunneling and everything is very superficial and very close to complete closure at this point. Overall I am extremely pleased with where we stand at this time. No fevers, chills, nausea, vomiting, or diarrhea. 09/18/2020 upon evaluation today patient appears to be doing well with regard to his knee  ulcer. In fact this appears to be completely healed I am extremely pleased in that regard. I do not see any signs of active infection at this point. No fevers, chills, nausea, vomiting, or diarrhea. Electronic Signature(s) Signed: 09/18/2020 10:32:12 AM By: Worthy Keeler PA-C Entered By: Worthy Keeler on 09/18/2020 10:32:12 Fassnacht, [name] ([id number]) -------------------------------------------------------------------------------- Physical Exam Details Patient Name: [name]. Date of Service: 09/18/2020 9:00 AM Medical Record Number: [id number] Patient Account Number: [id number] Date of Birth/Sex: 01-Harmon-1947 (75 y.o. M) Treating RN: Donnamarie Poag Primary Care Provider: Otilio Miu Other Clinician: Referring Provider: Otilio Miu Treating Provider/Extender: Jeri Cos Weeks in Treatment: 7 Constitutional Well-nourished and well-hydrated in no acute distress. Respiratory normal breathing without difficulty. Psychiatric this patient is able to make decisions and demonstrates good insight into disease process. Alert and Oriented x 3. pleasant and cooperative. Notes Upon inspection patient's wound bed actually showed signs of good granulation epithelization at this point. There does not appear to be any evidence of infection which is great news and overall I am extremely pleased with where we stand at this point. No fevers, chills, nausea, vomiting, or diarrhea. Electronic Signature(s) Signed: 09/18/2020 10:32:34 AM By: Worthy Keeler PA-C Entered By: Melburn Hake,  Yariah Selvey on 09/18/2020 10:32:34 [name], [name] ([id number]) -------------------------------------------------------------------------------- Physician Orders Details Patient Name: KENO, CARAWAY. Date of Service: 09/18/2020 9:00 AM Medical Record Number: [id number] Patient Account Number: [id number] Date of Birth/Sex: [birth year] (75 y.o. 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 May leave open to air as wound is healed. Wound Treatment Electronic Signature(s) Signed: 09/18/2020 11:11:07 AM By: Donnamarie Poag Signed: 09/18/2020 5:46:17 PM By: Worthy Keeler PA-C Entered By: Donnamarie Poag on 09/18/2020 09:15:21 Picking, [name] ([id number]) -------------------------------------------------------------------------------- Problem List Details Patient Name: [name]. Date of Service: 09/18/2020 9:00 AM Medical Record Number: [id number] Patient Account Number: [id number] Date of Birth/Sex: 1946-03-11 (75 y.o. M) Treating RN: Donnamarie Poag Primary Care Provider: Otilio Miu Other Clinician: Referring Provider: Otilio Miu Treating Provider/Extender: Skipper Cliche in Treatment: 7 Active Problems ICD-10 Encounter Code Description Active Date MDM Diagnosis T81.31XA Disruption of external operation (surgical) wound, not elsewhere 07/25/2020 No Yes classified, initial encounter L97.802 Non-pressure chronic ulcer of other part of unspecified lower leg with fat 07/25/2020 No Yes layer exposed E11.622 Type 2 diabetes mellitus with other skin ulcer 07/25/2020 No Yes I10 Essential (primary) hypertension 07/25/2020 No Yes N18.30 Chronic kidney disease, stage 3 unspecified 07/25/2020 No Yes D56.1  Beta thalassemia 07/25/2020 No Yes Inactive Problems Resolved Problems Electronic Signature(s) Signed: 09/18/2020 8:42:55 AM By: Worthy Keeler PA-C Entered By: Worthy Keeler on 09/18/2020 08:42:55 Horn, [name] ([id number]) -------------------------------------------------------------------------------- Progress Note Details Patient Name: Robert Dolly T. Date of Service: 09/18/2020 9:00 AM Medical Record Number: [id number] Patient Account Number: [id number] Date of Birth/Sex: 08/21/1945 (75 y.o. M) Treating RN: Donnamarie Poag Primary Care Provider: Otilio Miu Other Clinician: Referring Provider: Otilio Miu Treating Provider/Extender: Skipper Cliche in Treatment: 7 Subjective Chief Complaint Information obtained from Patient Left Knee Ulcer History of Present Illness (HPI) 07/25/2020 upon evaluation today patient presents for initial evaluation here in the clinic. I did have actually an extensive conversation with [facility] yesterday including the patient's physician who performed the surgery. Unfortunately this patient sustained a chainsaw injury on April 12 when he was working. He states he was cutting a limb when he saw the limb was likely going to fall but did not think that it would come his way. When it did it actually hit his arm and then knocked his arm down and he as he was holding a chainsaw this went into his leg on the lateral portion of his left knee. Subsequently he ended up going to urgent care which led to the orthopedic office which led to him going into surgery for a multilayer closure. There is still a small area that is open unfortunately that could not be completely closed and the lower portion of the wound laterally. The patient fortunately is not having a significant amount of pain he does have a little bit of hematoma here it is going to be cleaned out. He did have a temporary wound VAC in place  though again this does not appear to likely be  necessary at this point based on what I am seeing her at least this version of the [facility] is not going to be the best way to go. The patient does have diabetes mellitus type 2 and he has a hemoglobin A1c of 6.9 on most recent check. He is also currently on clindamycin and has an ABI of 1.18 on this side. His blood sugar this morning was 95. He keeps a close eye on this. Otherwise the patient does have a history of hypertension, chronic kidney disease stage III, and beta thalassemia. Of note when I spoke with the patient's surgeon yesterday at [facility] he stated that this is a patient whom they would not be following he wanted to turn care over to [place]. I am definitely okay with that I just wanted to make sure that since the patient was in a postop global that there was not anything they still wanted to monitor but again the care is pretty much been turned over to [place] after that conversation which is why did work to get the patient in today so that we can give appropriate home health orders for the patient as there did not appear to be any direction that was being covered as far as providing direct orders to home health aide which is what ever the hospital case manager and recommended according to what the physician told me. Nonetheless I think that we can probably get some better orders back to home health and get them out to see the patient of performing the appropriate dressing changes. 08/08/2020 upon evaluation today patient appears to be doing excellent in regard to his knee ulcer. I think this is doing quite well although I think there may be over packing into the area of undermining currently. We can address that at this point. Otherwise the patient seems to be doing quite well. 08/14/2020 upon evaluation today patient appears to be doing well with regard to his knee. Has been tolerating the dressing changes without complication. Fortunately there is no signs of infection and I think he is managing  quite nicely here. The packing seems to have gone much better over the past week which I am very pleased in regard to. 08/21/2020 upon evaluation today patient appears to be doing well with regard to his wound. He has been tolerating the dressing changes without complication. Fortunately there is no signs of active infection which is great news. I do think that we need to get this area to reattach and at this point I think collagen may be the best way to go about doing that. The other option will be removing the flap of skin overlying this area since there seems to be a little bit of epiboly occurring along the edge. Nonetheless I would prefer to get this to try to reattach and seal up but if not then we can always address that secondarily. 5/23; the wound is measuring considerably smaller. He still has an area from about 11-4 o'clock with 0.4 cm of undermining where the over hanging skin is not attached however the rest of this looks quite good. We have been using silver collagen 09/11/2020 upon evaluation today patient appears to be doing decently well in regard to his wound on the knee. This is made dramatic improvement and overall I am extremely pleased with where things stand today. There does not appear to be any evidence of infection there is no tunneling and everything is very  superficial and very close to complete closure at this point. Overall I am extremely pleased with where we stand at this time. No fevers, chills, nausea, vomiting, or diarrhea. 09/18/2020 upon evaluation today patient appears to be doing well with regard to his knee ulcer. In fact this appears to be completely healed I am extremely pleased in that regard. I do not see any signs of active infection at this point. No fevers, chills, nausea, vomiting, or diarrhea. Objective Constitutional Well-nourished and well-hydrated in no acute distress. [name], [name] ([id number]) Vitals Time Taken: 8:49 AM, Height: 70 in,  Weight: 190 lbs, BMI: 27.3, Pulse: 64 bpm, Respiratory Rate: 16 breaths/min, Blood Pressure: 116/69 mmHg. General Notes: Unable to get temperature x 2 Respiratory normal breathing without difficulty. Psychiatric this patient is able to make decisions and demonstrates good insight into disease process. Alert and Oriented x 3. pleasant and cooperative. General Notes: Upon inspection patient's wound bed actually showed signs of good granulation epithelization at this point. There does not appear to be any evidence of infection which is great news and overall I am extremely pleased with where we stand at this point. No fevers, chills, nausea, vomiting, or diarrhea. Integumentary (Hair, Skin) Wound #1 status is Open. Original cause of wound was Trauma. The date acquired was: 07/18/2020. The wound has been in treatment 7 weeks. The wound is located on the Left,Lateral Knee. The wound measures 0cm length x 0cm width x 0cm depth; 0cm^2 area and 0cm^3 volume. There is no tunneling or undermining noted. There is a none present amount of drainage noted. There is no granulation within the wound bed. There is no necrotic tissue within the wound bed. 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 May leave open to air as wound is healed. 1. Would recommend that we going to discontinue wound care services as the patient appears to be completely healed. I do not see any signs that he is getting anything to cover this I really think just using some lotion over the area is ideal to  keep the skin supple and soft. Obviously if anything changes or anything reopens he should let me know ASAP. We will see the patient back for follow-up visit as needed. Electronic Signature(s) Signed: 09/18/2020 10:33:05 AM By: Worthy Keeler PA-C Entered By: Worthy Keeler on 09/18/2020 10:33:05 [name], [name] ([id number]) -------------------------------------------------------------------------------- SuperBill Details Patient Name: [name]. Date of Service: 09/18/2020 Medical Record Number: [id number] Patient Account Number: [id number] Date of Birth/Sex: 1945-06-09 (75 y.o. 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 E11.622 Type 2 diabetes mellitus with other skin ulcer I10 Essential (primary) hypertension N18.30 Chronic kidney disease, stage 3 unspecified D56.1 Beta thalassemia Facility Procedures CPT4 Code: [id number] Description: [phone] - WOUND CARE VISIT-LEV 2 EST PT Modifier: Quantity: 1 Physician Procedures CPT4 Code: [id number] Description: 99213 - WC PHYS LEVEL 3 - EST PT  Modifier: Quantity: 1 CPT4 Code: Description: ICD-10 Diagnosis Description T81.31XA Disruption of external operation (surgical) wound, not elsewhere classifie L97.802 Non-pressure chronic ulcer of other part of unspecified lower leg with fat E11.622 Type 2 diabetes mellitus with other  skin ulcer I10 Essential (primary) hypertension Modifier: d, initial encounter layer exposed Quantity: Electronic Signature(s) Signed: 09/18/2020 10:33:16 AM By: Worthy Keeler PA-C Entered By: Worthy Keeler on 09/18/2020  10:33:16

## 2020-09-25 ENCOUNTER — Ambulatory Visit: Payer: Medicare Other | Admitting: Physician Assistant

## 2020-10-19 DIAGNOSIS — E1122 Type 2 diabetes mellitus with diabetic chronic kidney disease: Secondary | ICD-10-CM | POA: Diagnosis not present

## 2020-10-19 DIAGNOSIS — D631 Anemia in chronic kidney disease: Secondary | ICD-10-CM | POA: Diagnosis not present

## 2020-10-19 DIAGNOSIS — I10 Essential (primary) hypertension: Secondary | ICD-10-CM | POA: Diagnosis not present

## 2020-10-19 DIAGNOSIS — N1832 Chronic kidney disease, stage 3b: Secondary | ICD-10-CM | POA: Diagnosis not present

## 2020-11-09 ENCOUNTER — Other Ambulatory Visit: Payer: Self-pay | Admitting: Family Medicine

## 2020-11-09 DIAGNOSIS — E1169 Type 2 diabetes mellitus with other specified complication: Secondary | ICD-10-CM

## 2020-11-09 DIAGNOSIS — E785 Hyperlipidemia, unspecified: Secondary | ICD-10-CM

## 2020-11-09 NOTE — Telephone Encounter (Signed)
Requested Prescriptions  Pending Prescriptions Disp Refills  . atorvastatin (LIPITOR) 40 MG tablet [Pharmacy Med Name: ATORVASTATIN 40 MG TABLET] 90 tablet 1    Sig: TAKE 1 TABLET BY MOUTH EVERY DAY     Cardiovascular:  Antilipid - Statins Passed - 11/09/2020  1:45 AM      Passed - Total Cholesterol in normal range and within 360 days    Cholesterol, Total  Date Value Ref Range Status  05/09/2020 139 100 - 199 mg/dL Final         Passed - LDL in normal range and within 360 days    LDL Chol Calc (NIH)  Date Value Ref Range Status  05/09/2020 78 0 - 99 mg/dL Final         Passed - HDL in normal range and within 360 days    HDL  Date Value Ref Range Status  05/09/2020 43 >39 mg/dL Final         Passed - Triglycerides in normal range and within 360 days    Triglycerides  Date Value Ref Range Status  05/09/2020 94 0 - 149 mg/dL Final         Passed - Patient is not pregnant      Passed - Valid encounter within last 12 months    Recent Outpatient Visits          2 months ago Type 2 diabetes mellitus with other circulatory complication, without long-term current use of insulin (HCC)   [facility] Juline Patch, MD   6 months ago Type 2 diabetes mellitus with other circulatory complication, without long-term current use of insulin ([place])   [facility] Juline Patch, MD   9 months ago Type 2 diabetes mellitus with other circulatory complication, without long-term current use of insulin ([place])   [facility] Juline Patch, MD   1 year ago Arthritis of carpometacarpal [facility]) joint of right thumb   [facility] Juline Patch, MD   1 year ago Type 2 diabetes mellitus with other circulatory complication, without long-term current use of insulin [facility])   [facility] Juline Patch, MD

## 2020-11-16 DIAGNOSIS — L570 Actinic keratosis: Secondary | ICD-10-CM | POA: Diagnosis not present

## 2020-11-16 DIAGNOSIS — Z872 Personal history of diseases of the skin and subcutaneous tissue: Secondary | ICD-10-CM | POA: Diagnosis not present

## 2020-11-16 DIAGNOSIS — L578 Other skin changes due to chronic exposure to nonionizing radiation: Secondary | ICD-10-CM | POA: Diagnosis not present

## 2020-12-17 ENCOUNTER — Other Ambulatory Visit: Payer: Self-pay | Admitting: Family Medicine

## 2020-12-17 DIAGNOSIS — E1159 Type 2 diabetes mellitus with other circulatory complications: Secondary | ICD-10-CM

## 2020-12-17 NOTE — Telephone Encounter (Signed)
Requested Prescriptions  Pending Prescriptions Disp Refills  . metFORMIN (GLUCOPHAGE) 500 MG tablet [Pharmacy Med Name: METFORMIN HCL 500 MG TABLET] 120 tablet 0    Sig: TAKE 1 TABLET BY MOUTH 2 TIMES DAILY WITH A MEAL.     Endocrinology:  Diabetes - Biguanides Failed - 12/17/2020 10:18 AM      Failed - Cr in normal range and within 360 days    Creatinine  Date Value Ref Range Status  07/16/2011 1.00 0.60 - 1.30 mg/dL Final   Creatinine, Ser  Date Value Ref Range Status  07/19/2020 1.78 (H) 0.61 - 1.24 mg/dL Final         Failed - eGFR in normal range and within 360 days    EGFR (African American)  Date Value Ref Range Status  07/16/2011 >60 >22m/min Final   GFR calc Af Amer  Date Value Ref Range Status  05/09/2020 48 (L) >59 mL/min/1.73 Final    Comment:    **In accordance with recommendations from the NKF-ASN Task force,**   [facility] is in the process of updating its eGFR calculation to the   2021 CKD-EPI creatinine equation that estimates kidney function   without a race variable.    EGFR (Non-African Amer.)  Date Value Ref Range Status  07/16/2011 >60 >628mmin Final    Comment:    eGFR values <6012min/1.73 m2 may be an indication of chronic kidney disease (CKD). Calculated eGFR, using the MRDR Study equation, is useful in  patients with stable renal function. The eGFR calculation will not be reliable in acutely ill patients when serum creatinine is changing rapidly. It is not useful in patients on dialysis. The eGFR calculation may not be applicable to patients at the low and high extremes of body sizes, pregnant women, and vegetarians.    GFR, Estimated  Date Value Ref Range Status  07/19/2020 40 (L) >60 mL/min Final    Comment:    (NOTE) Calculated using the CKD-EPI Creatinine Equation (2021)          Passed - HBA1C is between 0 and 7.9 and within 180 days    Hgb A1c MFr Bld  Date Value Ref Range Status  08/24/2020 6.4 (H) 4.8 - 5.6 % Final     Comment:             Prediabetes: 5.7 - 6.4          Diabetes: >6.4          Glycemic control for adults with diabetes: <7.0          Passed - Valid encounter within last 6 months    Recent Outpatient Visits          3 months ago Type 2 diabetes mellitus with other circulatory complication, without long-term current use of insulin ([facility]   7 months ago Type 2 diabetes mellitus with other circulatory complication, without long-term current use of insulin [facility]   11 months ago Type 2 diabetes mellitus with other circulatory complication, without long-term current use of insulin ([facility]   1 year ago [facility]th[facility]acarpal [facility]   1 year ago Type 2 diabetes mellitus with other circulatory complication, without long-term current use of insulin [facility]

## 2020-12-26 DIAGNOSIS — Z23 Encounter for immunization: Secondary | ICD-10-CM | POA: Diagnosis not present

## 2020-12-31 ENCOUNTER — Other Ambulatory Visit: Payer: Self-pay | Admitting: Family Medicine

## 2020-12-31 DIAGNOSIS — I10 Essential (primary) hypertension: Secondary | ICD-10-CM

## 2020-12-31 DIAGNOSIS — E1159 Type 2 diabetes mellitus with other circulatory complications: Secondary | ICD-10-CM

## 2021-01-08 DIAGNOSIS — Z23 Encounter for immunization: Secondary | ICD-10-CM | POA: Diagnosis not present

## 2021-02-07 ENCOUNTER — Other Ambulatory Visit: Payer: Self-pay | Admitting: Family Medicine

## 2021-02-07 DIAGNOSIS — E1159 Type 2 diabetes mellitus with other circulatory complications: Secondary | ICD-10-CM

## 2021-02-08 NOTE — Telephone Encounter (Signed)
Patient will need an office visit for further refills. Courtesy refill. Requested Prescriptions  Pending Prescriptions Disp Refills  . metFORMIN (GLUCOPHAGE) 500 MG tablet [Pharmacy Med Name: METFORMIN HCL 500 MG TABLET] 60 tablet 0    Sig: TAKE 1 TABLET BY MOUTH 2 TIMES DAILY WITH A MEAL.     Endocrinology:  Diabetes - Biguanides Failed - 02/07/2021  2:31 PM      Failed - Cr in normal range and within 360 days    Creatinine  Date Value Ref Range Status  07/16/2011 1.00 0.60 - 1.30 mg/dL Final   Creatinine, Ser  Date Value Ref Range Status  07/19/2020 1.78 (H) 0.61 - 1.24 mg/dL Final         Failed - eGFR in normal range and within 360 days    EGFR (African American)  Date Value Ref Range Status  07/16/2011 >60 >59m/min Final   GFR calc Af Amer  Date Value Ref Range Status  05/09/2020 48 (L) >59 mL/min/1.73 Final    Comment:    **In accordance with recommendations from the NKF-ASN Task force,**   [facility] is in the process of updating its eGFR calculation to the   2021 CKD-EPI creatinine equation that estimates kidney function   without a race variable.    EGFR (Non-African Amer.)  Date Value Ref Range Status  07/16/2011 >60 >68mmin Final    Comment:    eGFR values <6033min/1.73 m2 may be an indication of chronic kidney disease (CKD). Calculated eGFR, using the MRDR Study equation, is useful in  patients with stable renal function. The eGFR calculation will not be reliable in acutely ill patients when serum creatinine is changing rapidly. It is not useful in patients on dialysis. The eGFR calculation may not be applicable to patients at the low and high extremes of body sizes, pregnant women, and vegetarians.    GFR, Estimated  Date Value Ref Range Status  07/19/2020 40 (L) >60 mL/min Final    Comment:    (NOTE) Calculated using the CKD-EPI Creatinine Equation (2021)          Passed - HBA1C is between 0 and 7.9 and within 180 days    Hgb A1c MFr Bld  Date  Value Ref Range Status  08/24/2020 6.4 (H) 4.8 - 5.6 % Final    Comment:             Prediabetes: 5.7 - 6.4          Diabetes: >6.4          Glycemic control for adults with diabetes: <7.0          Passed - Valid encounter within last 6 months    Recent Outpatient Visits          5 months ago Type 2 diabetes mellitus with other circulatory complication, without long-term current use of insulin ([facility]   9 months ago Type 2 diabetes mellitus with other circulatory complication, without long-term current use of insulin ([facility]   1 year ago Type 2 diabetes mellitus with other circulatory complication, without long-term current use of insulin ([facility] [facility]   1 year ago [facility]th[facility]arpal [facility]   [facility]   1 year ago Type 2 diabetes mellitus with other circulatory complication, without long-term current use of insulin (  HCC)   [facility] Jones, Deanna C, MD              

## 2021-02-22 DIAGNOSIS — D631 Anemia in chronic kidney disease: Secondary | ICD-10-CM | POA: Diagnosis not present

## 2021-02-22 DIAGNOSIS — N1831 Chronic kidney disease, stage 3a: Secondary | ICD-10-CM | POA: Diagnosis not present

## 2021-02-22 DIAGNOSIS — E1122 Type 2 diabetes mellitus with diabetic chronic kidney disease: Secondary | ICD-10-CM | POA: Diagnosis not present

## 2021-02-22 DIAGNOSIS — I10 Essential (primary) hypertension: Secondary | ICD-10-CM | POA: Diagnosis not present

## 2021-03-06 ENCOUNTER — Other Ambulatory Visit: Payer: Self-pay | Admitting: Family Medicine

## 2021-03-06 DIAGNOSIS — E1159 Type 2 diabetes mellitus with other circulatory complications: Secondary | ICD-10-CM

## 2021-03-07 NOTE — Telephone Encounter (Signed)
Requested medications are due for refill today.  yes  Requested medications are on the active medications list.  yes  Last refill. 02/08/2021  Future visit scheduled.   no  Notes to clinic.  Pt already given a courtesy refill. Last seen in office 08/24/2020.

## 2021-03-12 ENCOUNTER — Other Ambulatory Visit: Payer: Self-pay | Admitting: Family Medicine

## 2021-03-12 DIAGNOSIS — I10 Essential (primary) hypertension: Secondary | ICD-10-CM

## 2021-03-12 NOTE — Telephone Encounter (Signed)
Requested medications are due for refill today.  yes  Requested medications are on the active medications list.  yes  Last refill. 01/01/2021  Future visit scheduled.   Not with provider  Notes to clinic.  Pharmacy needs Dx code. Pt last seen 5/19/202. Labs are overdue.

## 2021-03-20 ENCOUNTER — Encounter: Payer: Self-pay | Admitting: Family Medicine

## 2021-03-20 ENCOUNTER — Other Ambulatory Visit: Payer: Self-pay | Admitting: Family Medicine

## 2021-03-20 ENCOUNTER — Ambulatory Visit (INDEPENDENT_AMBULATORY_CARE_PROVIDER_SITE_OTHER): Payer: Medicare Other | Admitting: Family Medicine

## 2021-03-20 ENCOUNTER — Other Ambulatory Visit: Payer: Self-pay

## 2021-03-20 VITALS — BP 110/80 | HR 64 | Ht 70.0 in | Wt 196.0 lb

## 2021-03-20 DIAGNOSIS — E785 Hyperlipidemia, unspecified: Secondary | ICD-10-CM | POA: Diagnosis not present

## 2021-03-20 DIAGNOSIS — I10 Essential (primary) hypertension: Secondary | ICD-10-CM

## 2021-03-20 DIAGNOSIS — E1169 Type 2 diabetes mellitus with other specified complication: Secondary | ICD-10-CM

## 2021-03-20 DIAGNOSIS — E1159 Type 2 diabetes mellitus with other circulatory complications: Secondary | ICD-10-CM

## 2021-03-20 MED ORDER — GLIPIZIDE 10 MG PO TABS: 10.0000 mg | ORAL_TABLET | Freq: Two times a day (BID) | ORAL | 1 refills | Status: DC

## 2021-03-20 MED ORDER — LOSARTAN POTASSIUM 50 MG PO TABS: 50.0000 mg | ORAL_TABLET | Freq: Every day | ORAL | 1 refills | Status: DC

## 2021-03-20 MED ORDER — METFORMIN HCL 500 MG PO TABS: 500.0000 mg | ORAL_TABLET | Freq: Two times a day (BID) | ORAL | 1 refills | Status: DC

## 2021-03-20 MED ORDER — ATORVASTATIN CALCIUM 40 MG PO TABS: 40.0000 mg | ORAL_TABLET | Freq: Every day | ORAL | 1 refills | Status: DC

## 2021-03-20 NOTE — Progress Notes (Signed)
Date:  03/20/2021   Name:  [name]   DOB:  1945/07/26   MRN:  [id number]   Chief Complaint: Diabetes (Needs foot exam), Hypertension, and Hyperlipidemia  Diabetes He presents for his follow-up diabetic visit. He has type 2 diabetes mellitus. His disease course has been stable. There are no hypoglycemic associated symptoms. Pertinent negatives for hypoglycemia include no dizziness, headaches, nervousness/anxiousness or sweats. Pertinent negatives for diabetes include no blurred vision, no chest pain, no fatigue, no foot paresthesias, no foot ulcerations, no polydipsia, no polyphagia, no polyuria, no visual change, no weakness and no weight loss. There are no hypoglycemic complications. Symptoms are stable. There are no diabetic complications. Pertinent negatives for diabetic complications include no CVA, PVD or retinopathy. Risk factors for coronary artery disease include dyslipidemia, male sex, diabetes mellitus, hypertension and post-menopausal. Current diabetic treatment includes oral agent (dual therapy). He is compliant with treatment all of the time. His weight is stable. He is following a generally healthy diet. Meal planning includes avoidance of concentrated sweets and carbohydrate counting. He participates in exercise intermittently. His breakfast blood glucose is taken between 8-9 am. His breakfast blood glucose range is generally 110-130 mg/dl. An ACE inhibitor/angiotensin II receptor blocker is being taken.  Hypertension This is a chronic problem. The current episode started more than 1 year ago. The problem has been gradually improving since onset. Pertinent negatives include no anxiety, blurred vision, chest pain, headaches, malaise/fatigue, neck pain, orthopnea, palpitations, peripheral edema, PND, shortness of breath or sweats. There are no associated agents to hypertension. Past treatments include angiotensin blockers. The current treatment provides moderate improvement.  There are no compliance problems.  Hypertensive end-organ damage includes CAD/MI. There is no history of angina, kidney disease, CVA, heart failure, left ventricular hypertrophy, PVD or retinopathy. There is no history of chronic renal disease, a hypertension causing med or renovascular disease.  Hyperlipidemia This is a chronic problem. The current episode started more than 1 year ago. The problem is controlled. Recent lipid tests were reviewed and are normal. He has no history of chronic renal disease, diabetes, hypothyroidism, liver disease, obesity or nephrotic syndrome. Pertinent negatives include no chest pain, myalgias or shortness of breath. Current antihyperlipidemic treatment includes statins. The current treatment provides moderate improvement of lipids. There are no compliance problems.  Risk factors for coronary artery disease include dyslipidemia and hypertension.   Lab Results  Component Value Date   NA 136 07/19/2020   K 4.9 07/19/2020   CO2 25 07/19/2020   GLUCOSE 215 (H) 07/19/2020   BUN 26 (H) 07/19/2020   CREATININE 1.78 (H) 07/19/2020   CALCIUM 8.9 07/19/2020   GFRNONAA 40 (L) 07/19/2020   Lab Results  Component Value Date   CHOL 139 05/09/2020   HDL 43 05/09/2020   LDLCALC 78 05/09/2020   TRIG 94 05/09/2020   CHOLHDL 4.6 11/05/2017   Lab Results  Component Value Date   TSH 5.153 (H) 04/25/2015   Lab Results  Component Value Date   HGBA1C 6.4 (H) 08/24/2020   Lab Results  Component Value Date   WBC 6.3 07/19/2020   HGB 9.4 (L) 07/19/2020   HCT 29.9 (L) 07/19/2020   MCV 62.7 (L) 07/19/2020   PLT 104 (L) 07/19/2020   Lab Results  Component Value Date   ALT 16 07/19/2020   AST 19 07/19/2020   ALKPHOS 49 07/19/2020   BILITOT 1.0 07/19/2020   No results found for: 25OHVITD2, 25OHVITD3, VD25OH   Review of Systems  Constitutional:  Negative for chills, fatigue, fever, malaise/fatigue and weight loss.  HENT:  Negative for drooling, ear discharge, ear  pain and sore throat.   Eyes:  Negative for blurred vision.  Respiratory:  Negative for cough, shortness of breath and wheezing.   Cardiovascular:  Negative for chest pain, palpitations, orthopnea, leg swelling and PND.  Gastrointestinal:  Negative for abdominal pain, blood in stool, constipation, diarrhea and nausea.  Endocrine: Negative for polydipsia, polyphagia and polyuria.  Genitourinary:  Negative for dysuria, frequency, hematuria and urgency.  Musculoskeletal:  Negative for back pain, myalgias and neck pain.  Skin:  Negative for rash.  Allergic/Immunologic: Negative for environmental allergies.  Neurological:  Negative for dizziness, weakness and headaches.  Hematological:  Does not bruise/bleed easily.  Psychiatric/Behavioral:  Negative for suicidal ideas. The patient is not nervous/anxious.    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   Thrombocytopenia ([place]) 04/20/2015   Coronary artery disease 04/18/2015   Unstable angina ([place]) 04/17/2015   Elevated troponin 04/17/2015   DM type 2 causing CKD stage 3 ([place]) 04/17/2015   Essential hypertension 04/17/2015    No Known Allergies  Past Surgical History:  Procedure Laterality Date   CARDIAC CATHETERIZATION N/A 04/18/2015   Procedure: Left Heart Cath and Coronary Angiography;  Surgeon: Yolonda Kida, MD;  Location: [facility];  Service: Cardiovascular;  Laterality: N/A;   COLONOSCOPY  2014    cleared for 10 yrs   CORONARY ARTERY BYPASS GRAFT N/A 04/21/2015   Procedure: CORONARY ARTERY BYPASS GRAFTING (CABG) X 4 UTILIZING THE LEFT INTERNAL MAMMARY ARTERY TO LAD, ENDOSCOPICALLY HARVESTED RIGHT SAPHENEOUS VEINS TO PD,DIAGONAL AND CIRCUMFLEX.;  Surgeon: Grace Isaac, MD;  Location: [facility];  Service: Open Heart Surgery;  Laterality: N/A;   DUPUYTREN CONTRACTURE RELEASE Left ~ 2000   EYE SURGERY Bilateral    cataract removed   INCISION AND DRAINAGE Left 07/18/2020   Procedure: INCISION AND DRAINAGE LEFT KNEE;  Surgeon: Renee Harder, MD;  Location: [facility];  Service: Orthopedics;  Laterality: Left;   KNEE ARTHROSCOPY Right ~ 1995   TEE WITHOUT CARDIOVERSION N/A 04/21/2015   Procedure: TRANSESOPHAGEAL ECHOCARDIOGRAM (TEE);  Surgeon: Grace Isaac, MD;  Location: [facility];  Service: Open Heart Surgery;  Laterality: N/A;    Social History   Tobacco Use   Smoking status: Never   Smokeless tobacco: Never  Vaping Use   Vaping Use: Never used  Substance Use Topics   Alcohol use: Not Currently    Alcohol/week: 0.0 standard drinks   Drug use: No     Medication list has been reviewed and updated.  Current Meds  Medication Sig   acetaminophen (TYLENOL) 500 MG tablet Take 2 tablets (1,000 mg total) by mouth every 8 (eight) hours.   aspirin 81 MG tablet Take 1 tablet (81 mg total) by mouth daily.   atorvastatin (LIPITOR) 40 MG tablet TAKE 1 TABLET BY MOUTH EVERY DAY   dapagliflozin propanediol (FARXIGA) 10  MG TABS tablet Take 10 mg by mouth daily. lateef   glipiZIDE (GLUCOTROL) 10 MG tablet TAKE 1 TABLET (10 MG TOTAL) BY MOUTH 2 (TWO) TIMES DAILY BEFORE A MEAL.   glucose blood (FREESTYLE LITE) test strip Use as instructed   losartan (COZAAR) 50 MG tablet TAKE 1 TABLET BY MOUTH EVERY DAY   metFORMIN (GLUCOPHAGE) 500 MG tablet TAKE 1 TABLET BY MOUTH 2 TIMES DAILY WITH A MEAL.    PHQ 2/9 Scores 08/24/2020 05/09/2020 05/03/2020 01/17/2020  PHQ - 2 Score 0 0 0 0  PHQ- 9 Score 0 0 -  0    GAD 7 : Generalized Anxiety Score 08/24/2020 05/09/2020 01/17/2020 09/13/2019  Nervous, Anxious, on Edge 0 0 0 0  Control/stop worrying 0 0 0 0  Worry too much - different things 0 0 0 0  Trouble relaxing 0 0 0 0  Restless 0 0 0 0  Easily annoyed or irritable 0 0 0 0  Afraid - awful might happen 0 0 0 0  Total GAD 7 Score 0 0 0 0  Anxiety Difficulty Not difficult at all - - -    BP Readings from Last 3 Encounters:  03/20/21 110/80  08/24/20 132/70  07/19/20 (!) 108/54    Physical Exam Vitals and nursing note reviewed.  HENT:     Head: Normocephalic.     Right Ear: Tympanic membrane and external ear normal.     Left Ear: Tympanic membrane and external ear normal.     Nose: Nose normal. No congestion or rhinorrhea.  Eyes:     General: No scleral icterus.       Right eye: No discharge.        Left eye: No discharge.     Conjunctiva/sclera: Conjunctivae normal.     Pupils: Pupils are equal, round, and reactive to light.  Neck:     Thyroid: No thyromegaly.     Vascular: No JVD.     Trachea: No tracheal deviation.  Cardiovascular:     Rate and Rhythm: Normal rate and regular rhythm.     Heart sounds: Normal heart sounds. No murmur heard.   No friction rub. No gallop.  Pulmonary:     Effort: No respiratory distress.     Breath sounds: Normal breath sounds. No wheezing, rhonchi or rales.  Abdominal:     General: Bowel sounds are normal.     Palpations: Abdomen is soft. There is no mass.     Tenderness: There is no abdominal tenderness. There is no guarding or rebound.  Musculoskeletal:        General: No tenderness. Normal range of motion.     Cervical back: Normal range of motion and neck supple.  Lymphadenopathy:     Cervical: No cervical adenopathy.  Skin:    General: Skin is warm.     Findings: No rash.  Neurological:     Mental Status: He is alert and oriented to person, place, and time.     Cranial Nerves: No cranial nerve deficit.     Deep Tendon Reflexes:  Reflexes are normal and symmetric.    Wt Readings from Last 3 Encounters:  03/20/21 196 lb (88.9 kg)  08/24/20 194 lb (88 kg)  07/18/20 190 lb (86.2 kg)    BP 110/80    Pulse 64    Ht 5\' 10"  (1.778 m)    Wt 196 lb (88.9 kg)    BMI 28.12 kg/m   Assessment and Plan:  1. Hyperlipidemia  associated with type 2 diabetes mellitus (HCC) Chronic.  Controlled.  Stable.  Continue atorvastatin 40 mg once a day.  Will check lipid panel for current status of LDL.  Patient is encouraged to continue low-cholesterol intake. - atorvastatin (LIPITOR) 40 MG tablet; Take 1 tablet (40 mg total) by mouth daily.  Dispense: 90 tablet; Refill: 1 - Lipid Panel With LDL/HDL Ratio  2. Type 2 diabetes mellitus with other circulatory complication, without long-term current use of insulin (HCC) Chronic.  Controlled.  Stable.  Continue metformin 500 mg twice a day and glipizide 10 mg twice a day.  Will check A1c for current level of control.  We will also check microalbumin for diabetic nephropathy. - glipiZIDE (GLUCOTROL) 10 MG tablet; Take 1 tablet (10 mg total) by mouth 2 (two) times daily before a meal.  Dispense: 180 tablet; Refill: 1 - metFORMIN (GLUCOPHAGE) 500 MG tablet; Take 1 tablet (500 mg total) by mouth 2 (two) times daily with a meal.  Dispense: 180 tablet; Refill: 1 - HgB A1c - Microalbumin, urine  3. Essential hypertension Chronic.  Controlled.  Stable.  Blood pressure is 110/80.  Continue losartan 50 mg once a day.  We will recheck patient in 4 months at which time we will do renal function panel. - losartan (COZAAR) 50 MG tablet; Take 1 tablet (50 mg total) by mouth daily.  Dispense: 90 tablet; Refill: 1

## 2021-03-21 LAB — LIPID PANEL WITH LDL/HDL RATIO
Cholesterol, Total: 162 mg/dL (ref 100–199)
HDL: 40 mg/dL (ref >39)
LDL Chol Calc (NIH): 102 mg/dL — ABNORMAL HIGH (ref 0–99)
LDL/HDL Ratio: 2.6 ratio (ref 0.0–3.6)
Triglycerides: 111 mg/dL (ref 0–149)
VLDL Cholesterol Cal: 20 mg/dL (ref 5–40)

## 2021-03-21 LAB — MICROALBUMIN, URINE: Microalbumin, Urine: 16.2 ug/mL

## 2021-03-21 LAB — HEMOGLOBIN A1C
Est. average glucose Bld gHb Est-mCnc: 148 mg/dL
Hgb A1c MFr Bld: 6.8 % — ABNORMAL HIGH (ref 4.8–5.6)

## 2021-05-07 ENCOUNTER — Ambulatory Visit (INDEPENDENT_AMBULATORY_CARE_PROVIDER_SITE_OTHER): Payer: Medicare Other

## 2021-05-07 ENCOUNTER — Other Ambulatory Visit: Payer: Self-pay

## 2021-05-07 VITALS — BP 122/62 | HR 77 | Temp 98.1°F | Resp 16 | Ht 70.0 in | Wt 198.8 lb

## 2021-05-07 DIAGNOSIS — Z0000 Encounter for general adult medical examination without abnormal findings: Secondary | ICD-10-CM | POA: Diagnosis not present

## 2021-05-07 NOTE — Progress Notes (Signed)
Subjective:   [name] is a 76 y.o. male who presents for Medicare Annual/Subsequent preventive examination.  Review of Systems     Cardiac Risk Factors include: advanced age (>48men, >39 women);diabetes mellitus;dyslipidemia;male gender;hypertension     Objective:    Today's Vitals   05/07/21 1001  BP: 122/62  Pulse: 77  Resp: 16  Temp: 98.1 F (36.7 C)  TempSrc: Oral  SpO2: 97%  Weight: 198 lb 12.8 oz (90.2 kg)  Height: 5\' 10"  (1.778 m)   Body mass index is 28.52 kg/m.  Advanced Directives 05/07/2021 07/19/2020 07/18/2020 05/03/2020 05/03/2019 03/20/2016 03/06/2016  Does Patient Have a Medical Advance Directive? Yes Yes Yes Yes Yes Yes Yes  Type of Paramedic of [place];Living will Living will Living will [place];Living will [place];Living will - -  Does patient want to make changes to medical advance directive? - No - Patient declined - - - - -  Copy of [place] in Chart? Yes - validated most recent copy scanned in chart (See row information) - - Yes - validated most recent copy scanned in chart (See row information) Yes - validated most recent copy scanned in chart (See row information) - -  Would patient like information on creating a medical advance directive? - - - - - - -    Current Medications (verified) Outpatient Encounter Medications as of 05/07/2021  Medication Sig   acetaminophen (TYLENOL) 500 MG tablet Take 2 tablets (1,000 mg total) by mouth every 8 (eight) hours.   aspirin 81 MG tablet Take 1 tablet (81 mg total) by mouth daily.   atorvastatin (LIPITOR) 40 MG tablet Take 1 tablet (40 mg total) by mouth daily.   dapagliflozin propanediol (FARXIGA) 10 MG TABS tablet Take 10 mg by mouth daily. lateef   glipiZIDE (GLUCOTROL) 10 MG tablet Take 1 tablet (10 mg total) by mouth 2 (two) times daily before a meal.   glucose blood (FREESTYLE LITE) test strip Use as instructed    losartan (COZAAR) 50 MG tablet Take 1 tablet (50 mg total) by mouth daily.   metFORMIN (GLUCOPHAGE) 500 MG tablet Take 1 tablet (500 mg total) by mouth 2 (two) times daily with a meal.   sildenafil (VIAGRA) 50 MG tablet TAKE ONE TABLET DAILY AS NEEDED. (Patient not taking: Reported on 03/20/2021)   No facility-administered encounter medications on file as of 05/07/2021.    Allergies (verified) Patient has no known allergies.   History: Past Medical History:  Diagnosis Date   Coronary artery disease    Hyperlipidemia    Hypertension    Type II diabetes mellitus ([place])    Past Surgical History:  Procedure Laterality Date   CARDIAC CATHETERIZATION N/A 04/18/2015   Procedure: Left Heart Cath and Coronary Angiography;  Surgeon: Yolonda Kida, MD;  Location: [facility];  Service: Cardiovascular;  Laterality: N/A;   COLONOSCOPY  2014   cleared for 10 yrs   CORONARY ARTERY BYPASS GRAFT N/A 04/21/2015   Procedure: CORONARY ARTERY BYPASS GRAFTING (CABG) X 4 UTILIZING THE LEFT INTERNAL MAMMARY ARTERY TO LAD, ENDOSCOPICALLY HARVESTED RIGHT SAPHENEOUS VEINS TO PD,DIAGONAL AND CIRCUMFLEX.;  Surgeon: Grace Isaac, MD;  Location: [facility];  Service: Open Heart Surgery;  Laterality: N/A;   DUPUYTREN CONTRACTURE RELEASE Left ~ 2000   EYE SURGERY Bilateral    cataract removed   INCISION AND DRAINAGE Left 07/18/2020   Procedure: INCISION AND DRAINAGE LEFT KNEE;  Surgeon: Sharlet Salina,  Rodman Key, MD;  Location: [facility];  Service: Orthopedics;  Laterality: Left;   KNEE ARTHROSCOPY Right ~ 1995   TEE WITHOUT CARDIOVERSION N/A 04/21/2015   Procedure: TRANSESOPHAGEAL ECHOCARDIOGRAM (TEE);  Surgeon: Grace Isaac, MD;  Location: [facility];  Service: Open Heart Surgery;  Laterality: N/A;   Family History  Problem Relation Age of Onset   Diabetes Mother    Heart attack Other    Hypertension Other    Social History   Socioeconomic History   Marital status: Married    Spouse name: Not on file    Number of children: 1   Years of education: Not on file   Highest education level: Not on file  Occupational History   Not on file  Tobacco Use   Smoking status: Never   Smokeless tobacco: Never  Vaping Use   Vaping Use: Never used  Substance and Sexual Activity   Alcohol use: Not Currently    Alcohol/week: 0.0 standard drinks   Drug use: No   Sexual activity: Not Currently  Other Topics Concern   Not on file  Social History Narrative   Not on file   Social Determinants of Health   Financial Resource Strain: Low Risk    Difficulty of Paying Living Expenses: Not hard at all  Food Insecurity: No Food Insecurity   Worried About Charity fundraiser in the Last Year: Never true   [place] in the Last Year: Never true  Transportation Needs: No Transportation Needs   Lack of Transportation (Medical): No   Lack of Transportation (Non-Medical): No  Physical Activity: Inactive   Days of Exercise per Week: 0 days   Minutes of Exercise per Session: 0 min  Stress: No Stress Concern Present   Feeling of Stress : Not at all  Social Connections: Moderately Integrated   Frequency of Communication with Friends and Family: More than three times a week   Frequency of Social Gatherings with Friends and Family: More than three times a week   Attends Religious Services: Never   Marine scientist or Organizations: Yes   Attends Music therapist: More than 4 times per year   Marital Status: Married    Tobacco Counseling Counseling given: Not Answered   Clinical Intake:  Pre-visit preparation completed: Yes  Pain : No/denies pain     BMI - recorded: 28.52 Nutritional Status: BMI 25 -29 Overweight Nutritional Risks: None Diabetes: Yes CBG done?: No Did pt. bring in CBG monitor from home?: No  How often do you need to have someone help you when you read instructions, pamphlets, or other written materials from your doctor or pharmacy?: 1 -  Never  Nutrition Risk Assessment:  Has the patient had any N/V/D within the last 2 months?  No  Does the patient have any non-healing wounds?  No  Has the patient had any unintentional weight loss or weight gain?  No   Diabetes:  Is the patient diabetic?  Yes  If diabetic, was a CBG obtained today?  No  Did the patient bring in their glucometer from home?  No  How often do you monitor your CBG's? daily.   Financial Strains and Diabetes Management:  Are you having any financial strains with the device, your supplies or your medication? No .  Does the patient want to be seen by Chronic Care Management for management of their diabetes?  No  Would the patient like to be referred to a Nutritionist  or for Diabetic Management?  No   Diabetic Exams:  Diabetic Eye Exam: Completed 06/19/20 negative retinopathy.   Diabetic Foot Exam: Completed 03/20/21.   Interpreter Needed?: No  Information entered by :: Clemetine Marker LPN   Activities of Daily Living In your present state of health, do you have any difficulty performing the following activities: 05/07/2021 07/19/2020  Hearing? N N  Vision? N N  Difficulty concentrating or making decisions? N N  Walking or climbing stairs? N N  Dressing or bathing? N N  Doing errands, shopping? N N  Preparing Food and eating ? N -  Using the Toilet? N -  In the past six months, have you accidently leaked urine? N -  Do you have problems with loss of bowel control? N -  Managing your Medications? N -  Managing your Finances? N -  Housekeeping or managing your Housekeeping? N -  Some recent data might be hidden    Patient Care Team: Juline Patch, MD as PCP - General (Family Medicine) Ubaldo Glassing Javier Docker, MD as Consulting Physician (Cardiology) Anthonette Legato, MD (Internal Medicine)  Indicate any recent Medical Services you may have received from other than [facility] providers in the past year (date may be approximate).     Assessment:   This is a  routine wellness examination for [name].  Hearing/Vision screen Hearing Screening - Comments:: Pt denies hearing difficulty Vision Screening - Comments:: Annual vision screenings done with Dr. [facility]tilano [facility] in [facility]  Dietary issues and exercise activities discussed: Current Exercise Habits: The patient does not participate in regular exercise at present, Exercise limited by: None identified   Goals Addressed             This Visit's Progress    Increase physical activity   On track    Recommend regular physical activity at least 3 days per week       Depression Screen PHQ 2/9 Scores 05/07/2021 08/24/2020 05/09/2020 05/03/2020 01/17/2020 09/13/2019 05/13/2019  PHQ - 2 Score 0 0 0 0 0 0 0  PHQ- 9 Score - 0 0 - 0 0 0    Fall Risk Fall Risk  05/07/2021 08/24/2020 05/03/2020 01/17/2020 09/13/2019  Falls in the past year? 0 0 0 0 0  Comment - - - - -  Number falls in past yr: 0 0 0 - -  Injury with Fall? 0 0 0 - -  Risk for fall due to : No Fall Risks - No Fall Risks - -  Follow up Falls prevention discussed Falls evaluation completed Falls prevention discussed Falls evaluation completed Falls evaluation completed    [place]:  Any stairs in or around the home? Yes  If so, are there any without handrails? No  Home free of loose throw rugs in walkways, pet beds, electrical cords, etc? Yes  Adequate lighting in your home to reduce risk of falls? Yes   ASSISTIVE DEVICES UTILIZED TO PREVENT FALLS:  Life alert? No  Use of a cane, walker or w/c? No  Grab bars in the bathroom? No  Shower chair or bench in shower? Yes  Elevated toilet seat or a handicapped toilet? No   TIMED UP AND GO:  Was the test performed? Yes .  Length of time to ambulate 10 feet: 5 sec.   Gait steady and fast without use of assistive device  Cognitive Function: Normal cognitive status assessed by direct observation by this Nurse Health Advisor. No abnormalities found.  6CIT  Screen 05/03/2019  What Year? 0 points  What month? 0 points  What time? 0 points  Count back from 20 0 points  Months in reverse 0 points  Repeat phrase 0 points  Total Score 0    Immunizations Immunization History  Administered Date(s) Administered   Influenza, High Dose Seasonal PF 03/03/2017, 01/10/2018   Influenza-Unspecified 01/01/2019, 01/10/2020, 01/07/2021   PFIZER(Purple Top)SARS-COV-2 Vaccination 05/22/2019, 06/12/2019, 01/18/2020, 12/26/2020   Pneumococcal Conjugate-13 12/01/2014   Pneumococcal Polysaccharide-23 05/08/2017   Tdap 04/09/2011   Zoster, Live 04/09/2011    TDAP status: Due, Education has been provided regarding the importance of this vaccine. Advised may receive this vaccine at local pharmacy or Health Dept. Aware to provide a copy of the vaccination record if obtained from local pharmacy or Health Dept. Verbalized acceptance and understanding.  Flu Vaccine status: Up to date  Pneumococcal vaccine status: Up to date  Covid-19 vaccine status: Completed vaccines  Qualifies for Shingles Vaccine? Yes   Zostavax completed Yes   Shingrix Completed?: No.    Education has been provided regarding the importance of this vaccine. Patient has been advised to call insurance company to determine out of pocket expense if they have not yet received this vaccine. Advised may also receive vaccine at local pharmacy or Health Dept. Verbalized acceptance and understanding.  Screening Tests Health Maintenance  Topic Date Due   COVID-19 Vaccine (5 - Booster for Pfizer series) 02/20/2021   TETANUS/TDAP  04/08/2021   Zoster Vaccines- Shingrix (1 of 2) 06/18/2021 (Originally 10/12/1964)   OPHTHALMOLOGY EXAM  06/19/2021   HEMOGLOBIN A1C  09/18/2021   FOOT EXAM  03/20/2022   COLONOSCOPY (Pts 45-87yrs Insurance coverage will need to be confirmed)  04/28/2022   Pneumonia Vaccine 49+ Years old  Completed   INFLUENZA VACCINE  Completed   Hepatitis C Screening  Completed   HPV  VACCINES  Aged Out    Health Maintenance  Health Maintenance Due  Topic Date Due   COVID-19 Vaccine (5 - Booster for Inez series) 02/20/2021   TETANUS/TDAP  04/08/2021    Colorectal cancer screening: No longer required.   Lung Cancer Screening: (Low Dose CT Chest recommended if Age 8-80 years, 30 pack-year currently smoking OR have quit w/in 15years.) does not qualify.   Additional Screening:  Hepatitis C Screening: does qualify; Completed 05/08/17  Vision Screening: Recommended annual ophthalmology exams for early detection of glaucoma and other disorders of the eye. Is the patient up to date with their annual eye exam?  Yes  Who is the provider or what is the name of the office in which the patient attends annual eye exams? Dr. Atilano Median.   Dental Screening: Recommended annual dental exams for proper oral hygiene  Community Resource Referral / Chronic Care Management: CRR required this visit?  No   CCM required this visit?  No      Plan:     I have personally reviewed and noted the following in the patients chart:   Medical and social history Use of alcohol, tobacco or illicit drugs  Current medications and supplements including opioid prescriptions. Patient is not currently taking opioid prescriptions. Functional ability and status Nutritional status Physical activity Advanced directives List of other physicians Hospitalizations, surgeries, and ER visits in previous 12 months Vitals Screenings to include cognitive, depression, and falls Referrals and appointments  In addition, I have reviewed and discussed with patient certain preventive protocols, quality metrics, and best practice recommendations. A written personalized care plan for preventive  services as well as general preventive health recommendations were provided to patient.     Clemetine Marker, LPN   [id number]   Nurse Notes: pt c/o sore throat for the past 2 days, slight mucous, feeling better today. Pt  denies fever, chills, body aches. Advised to contact office if sxs worsen or persist.

## 2021-05-07 NOTE — Patient Instructions (Signed)
[name] , Thank you for taking time to come for your Medicare Wellness Visit. I appreciate your ongoing commitment to your health goals. Please review the following plan we discussed and let me know if I can assist you in the future.   Screening recommendations/referrals: Colonoscopy: no longer required Recommended yearly ophthalmology/optometry visit for glaucoma screening and checkup Recommended yearly dental visit for hygiene and checkup  Vaccinations: Influenza vaccine: done 01/07/21 Pneumococcal vaccine: done 05/08/17 Tdap vaccine: due Shingles vaccine: Shingrix discussed. Please contact your pharmacy for coverage information.  Covid-19:  done 05/12/19, 06/12/19, 01/18/20 & 12/26/20  Conditions/risks identified: Keep up the great work!  Next appointment: Follow up in one year for your annual wellness visit.   Preventive Care 76 Years and Older, Male Preventive care refers to lifestyle choices and visits with your health care provider that can promote health and wellness. What does preventive care include? A yearly physical exam. This is also called an annual well check. Dental exams once or twice a year. Routine eye exams. Ask your health care provider how often you should have your eyes checked. Personal lifestyle choices, including: Daily care of your teeth and gums. Regular physical activity. Eating a healthy diet. Avoiding tobacco and drug use. Limiting alcohol use. Practicing safe sex. Taking low doses of aspirin every day. Taking vitamin and mineral supplements as recommended by your health care provider. What happens during an annual well check? The services and screenings done by your health care provider during your annual well check will depend on your age, overall health, lifestyle risk factors, and family history of disease. Counseling  Your health care provider may ask you questions about your: Alcohol use. Tobacco use. Drug use. Emotional well-being. Home  and relationship well-being. Sexual activity. Eating habits. History of falls. Memory and ability to understand (cognition). Work and work Statistician. Screening  You may have the following tests or measurements: Height, weight, and BMI. Blood pressure. Lipid and cholesterol levels. These may be checked every 5 years, or more frequently if you are over 47 years old. Skin check. Lung cancer screening. You may have this screening every year starting at age 49 if you have a 30-pack-year history of smoking and currently smoke or have quit within the past 15 years. Fecal occult blood test (FOBT) of the stool. You may have this test every year starting at age 36. Flexible sigmoidoscopy or colonoscopy. You may have a sigmoidoscopy every 5 years or a colonoscopy every 10 years starting at age 71. Prostate cancer screening. Recommendations will vary depending on your family history and other risks. Hepatitis C blood test. Hepatitis B blood test. Sexually transmitted disease (STD) testing. Diabetes screening. This is done by checking your blood sugar (glucose) after you have not eaten for a while (fasting). You may have this done every 1-3 years. Abdominal aortic aneurysm (AAA) screening. You may need this if you are a current or former smoker. Osteoporosis. You may be screened starting at age 63 if you are at high risk. Talk with your health care provider about your test results, treatment options, and if necessary, the need for more tests. Vaccines  Your health care provider may recommend certain vaccines, such as: Influenza vaccine. This is recommended every year. Tetanus, diphtheria, and acellular pertussis (Tdap, Td) vaccine. You may need a Td booster every 10 years. Zoster vaccine. You may need this after age 43. Pneumococcal 13-valent conjugate (PCV13) vaccine. One dose is recommended after age 57. Pneumococcal polysaccharide (PPSV23) vaccine. One dose  is recommended after age 63. Talk to  your health care provider about which screenings and vaccines you need and how often you need them. This information is not intended to replace advice given to you by your health care provider. Make sure you discuss any questions you have with your health care provider. Document Released: 04/21/2015 Document Revised: 12/13/2015 Document Reviewed: 01/24/2015 Elsevier Interactive Patient Education  2017 [employer] Prevention in the Home Falls can cause injuries. They can happen to people of all ages. There are many things you can do to make your home safe and to help prevent falls. What can I do on the outside of my home? Regularly fix the edges of walkways and driveways and fix any cracks. Remove anything that might make you trip as you walk through a door, such as a raised step or threshold. Trim any bushes or trees on the path to your home. Use bright outdoor lighting. Clear any walking paths of anything that might make someone trip, such as rocks or tools. Regularly check to see if handrails are loose or broken. Make sure that both sides of any steps have handrails. Any raised decks and porches should have guardrails on the edges. Have any leaves, snow, or ice cleared regularly. Use sand or salt on walking paths during winter. Clean up any spills in your garage right away. This includes oil or grease spills. What can I do in the bathroom? Use night lights. Install grab bars by the toilet and in the tub and shower. Do not use towel bars as grab bars. Use non-skid mats or decals in the tub or shower. If you need to sit down in the shower, use a plastic, non-slip stool. Keep the floor dry. Clean up any water that spills on the floor as soon as it happens. Remove soap buildup in the tub or shower regularly. Attach bath mats securely with double-sided non-slip rug tape. Do not have throw rugs and other things on the floor that can make you trip. What can I do in the bedroom? Use  night lights. Make sure that you have a light by your bed that is easy to reach. Do not use any sheets or blankets that are too big for your bed. They should not hang down onto the floor. Have a firm chair that has side arms. You can use this for support while you get dressed. Do not have throw rugs and other things on the floor that can make you trip. What can I do in the kitchen? Clean up any spills right away. Avoid walking on wet floors. Keep items that you use a lot in easy-to-reach places. If you need to reach something above you, use a strong step stool that has a grab bar. Keep electrical cords out of the way. Do not use floor polish or wax that makes floors slippery. If you must use wax, use non-skid floor wax. Do not have throw rugs and other things on the floor that can make you trip. What can I do with my stairs? Do not leave any items on the stairs. Make sure that there are handrails on both sides of the stairs and use them. Fix handrails that are broken or loose. Make sure that handrails are as long as the stairways. Check any carpeting to make sure that it is firmly attached to the stairs. Fix any carpet that is loose or worn. Avoid having throw rugs at the top or bottom of the stairs.  If you do have throw rugs, attach them to the floor with carpet tape. Make sure that you have a light switch at the top of the stairs and the bottom of the stairs. If you do not have them, ask someone to add them for you. What else can I do to help prevent falls? Wear shoes that: Do not have high heels. Have rubber bottoms. Are comfortable and fit you well. Are closed at the toe. Do not wear sandals. If you use a stepladder: Make sure that it is fully opened. Do not climb a closed stepladder. Make sure that both sides of the stepladder are locked into place. Ask someone to hold it for you, if possible. Clearly mark and make sure that you can see: Any grab bars or handrails. First and last  steps. Where the edge of each step is. Use tools that help you move around (mobility aids) if they are needed. These include: Canes. Walkers. Scooters. Crutches. Turn on the lights when you go into a dark area. Replace any light bulbs as soon as they burn out. Set up your furniture so you have a clear path. Avoid moving your furniture around. If any of your floors are uneven, fix them. If there are any pets around you, be aware of where they are. Review your medicines with your doctor. Some medicines can make you feel dizzy. This can increase your chance of falling. Ask your doctor what other things that you can do to help prevent falls. This information is not intended to replace advice given to you by your health care provider. Make sure you discuss any questions you have with your health care provider. Document Released: 01/19/2009 Document Revised: 08/31/2015 Document Reviewed: 04/29/2014 Elsevier Interactive Patient Education  2017 [employer].

## 2021-06-26 DIAGNOSIS — N1831 Chronic kidney disease, stage 3a: Secondary | ICD-10-CM | POA: Diagnosis not present

## 2021-06-26 DIAGNOSIS — E1122 Type 2 diabetes mellitus with diabetic chronic kidney disease: Secondary | ICD-10-CM | POA: Diagnosis not present

## 2021-06-26 DIAGNOSIS — I10 Essential (primary) hypertension: Secondary | ICD-10-CM | POA: Diagnosis not present

## 2021-06-26 DIAGNOSIS — R809 Proteinuria, unspecified: Secondary | ICD-10-CM | POA: Diagnosis not present

## 2021-07-09 DIAGNOSIS — E119 Type 2 diabetes mellitus without complications: Secondary | ICD-10-CM | POA: Diagnosis not present

## 2021-07-09 DIAGNOSIS — Z961 Presence of intraocular lens: Secondary | ICD-10-CM | POA: Diagnosis not present

## 2021-07-20 ENCOUNTER — Encounter: Payer: Self-pay | Admitting: Family Medicine

## 2021-07-20 ENCOUNTER — Ambulatory Visit (INDEPENDENT_AMBULATORY_CARE_PROVIDER_SITE_OTHER): Payer: Medicare Other | Admitting: Family Medicine

## 2021-07-20 VITALS — BP 120/70 | HR 56 | Ht 70.0 in | Wt 195.0 lb

## 2021-07-20 DIAGNOSIS — E1159 Type 2 diabetes mellitus with other circulatory complications: Secondary | ICD-10-CM

## 2021-07-20 DIAGNOSIS — E1169 Type 2 diabetes mellitus with other specified complication: Secondary | ICD-10-CM | POA: Diagnosis not present

## 2021-07-20 DIAGNOSIS — I25810 Atherosclerosis of coronary artery bypass graft(s) without angina pectoris: Secondary | ICD-10-CM | POA: Diagnosis not present

## 2021-07-20 DIAGNOSIS — I10 Essential (primary) hypertension: Secondary | ICD-10-CM

## 2021-07-20 DIAGNOSIS — E785 Hyperlipidemia, unspecified: Secondary | ICD-10-CM

## 2021-07-20 LAB — HM DIABETES EYE EXAM

## 2021-07-20 MED ORDER — METFORMIN HCL 500 MG PO TABS: 500.0000 mg | ORAL_TABLET | Freq: Two times a day (BID) | ORAL | 0 refills | Status: DC

## 2021-07-20 MED ORDER — GLIPIZIDE 10 MG PO TABS: 10.0000 mg | ORAL_TABLET | Freq: Two times a day (BID) | ORAL | 0 refills | Status: DC

## 2021-07-20 NOTE — Progress Notes (Signed)
? ? ?Date:  07/20/2021  ? ?Name:  [name]   DOB:  May 03, 1945   MRN:  [id number] ? ? ?Chief Complaint: Diabetes ? ?Diabetes ?He presents for his follow-up diabetic visit. He has type 2 diabetes mellitus. His disease course has been stable. There are no hypoglycemic associated symptoms. Pertinent negatives for hypoglycemia include no dizziness, headaches or nervousness/anxiousness. There are no diabetic associated symptoms. Pertinent negatives for diabetes include no blurred vision, no chest pain, no fatigue, no foot paresthesias, no foot ulcerations, no polydipsia, no polyphagia, no polyuria, no visual change, no weakness and no weight loss. There are no hypoglycemic complications. Symptoms are stable. There are no diabetic complications. Risk factors for coronary artery disease include dyslipidemia and hypertension. Current diabetic treatment includes oral agent (dual therapy). He is compliant with treatment some of the time. His weight is stable. He is following a generally healthy diet. Meal planning includes avoidance of concentrated sweets and carbohydrate counting. He participates in exercise intermittently. His breakfast blood glucose is taken between 8-9 am. His breakfast blood glucose range is generally 90-110 mg/dl. An ACE inhibitor/angiotensin II receptor blocker is being taken. Eye exam is current.  ?Heart Problem ?This is a chronic (cabgx4) problem. The current episode started more than 1 year ago. Pertinent negatives include no abdominal pain, arthralgias, chest pain, chills, congestion, coughing, fatigue, fever, headaches, myalgias, nausea, neck pain, rash, sore throat, urinary symptoms, visual change or weakness. Nothing aggravates the symptoms.  ?Hypertension ?This is a chronic problem. The current episode started more than 1 year ago. The problem has been gradually improving since onset. The problem is controlled. Pertinent negatives include no blurred vision, chest pain, headaches, neck  pain, palpitations or shortness of breath. Past treatments include angiotensin blockers. The current treatment provides moderate improvement. There are no compliance problems.  Hypertensive end-organ damage includes CAD/MI.  ?Hyperlipidemia ?This is a chronic problem. The current episode started more than 1 year ago. The problem is controlled. Pertinent negatives include no chest pain, myalgias or shortness of breath. Current antihyperlipidemic treatment includes statins.  ? ?Lab Results  ?Component Value Date  ? NA 136 07/19/2020  ? K 4.9 07/19/2020  ? CO2 25 07/19/2020  ? GLUCOSE 215 (H) 07/19/2020  ? BUN 26 (H) 07/19/2020  ? CREATININE 1.78 (H) 07/19/2020  ? CALCIUM 8.9 07/19/2020  ? GFRNONAA 40 (L) 07/19/2020  ? ?Lab Results  ?Component Value Date  ? CHOL 162 03/20/2021  ? HDL 40 03/20/2021  ? LDLCALC 102 (H) 03/20/2021  ? TRIG 111 03/20/2021  ? CHOLHDL 4.6 11/05/2017  ? ?Lab Results  ?Component Value Date  ? TSH 5.153 (H) 04/25/2015  ? ?Lab Results  ?Component Value Date  ? HGBA1C 6.8 (H) 03/20/2021  ? ?Lab Results  ?Component Value Date  ? WBC 6.3 07/19/2020  ? HGB 9.4 (L) 07/19/2020  ? HCT 29.9 (L) 07/19/2020  ? MCV 62.7 (L) 07/19/2020  ? PLT 104 (L) 07/19/2020  ? ?Lab Results  ?Component Value Date  ? ALT 16 07/19/2020  ? AST 19 07/19/2020  ? ALKPHOS 49 07/19/2020  ? BILITOT 1.0 07/19/2020  ? ?No results found for: 25OHVITD2, [place], VD25OH  ? ?Review of Systems  ?Constitutional:  Negative for chills, fatigue, fever and weight loss.  ?HENT:  Negative for congestion, drooling, ear discharge, ear pain and sore throat.   ?Eyes:  Negative for blurred vision.  ?Respiratory:  Negative for cough, shortness of breath and wheezing.   ?Cardiovascular:  Negative for chest pain, palpitations  and leg swelling.  ?Gastrointestinal:  Negative for abdominal pain, blood in stool, constipation, diarrhea and nausea.  ?Endocrine: Negative for polydipsia, polyphagia and polyuria.  ?Genitourinary:  Negative for dysuria,  frequency, hematuria and urgency.  ?Musculoskeletal:  Negative for arthralgias, back pain, myalgias and neck pain.  ?Skin:  Negative for rash.  ?Allergic/Immunologic: Negative for environmental allergies.  ?Neurological:  Negative for dizziness, weakness and headaches.  ?Hematological:  Does not bruise/bleed easily.  ?Psychiatric/Behavioral:  Negative for suicidal ideas. The patient is not nervous/anxious.   ? ?Patient Active Problem List  ? Diagnosis Date Noted  ? Laceration and contusion of cerebral cortex ([place])   ? Hyperlipidemia   ? Laceration of left knee 07/18/2020  ? Knee laceration, left, initial encounter 07/18/2020  ? Hyperlipidemia associated with type 2 diabetes mellitus ([place]) 05/13/2019  ? Erectile dysfunction 05/13/2019  ? Pyuria 01/30/2019  ? Stage 3 chronic kidney disease ([place]) 01/30/2019  ? Beta thalassemia minor 03/20/2016  ? Anemia 03/06/2016  ? CAD of autologous bypass graft 05/10/2015  ? 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 Thrombocytopenia ([place]) 04/20/2015  ? Coronary artery disease 04/18/2015  ? Unstable angina ([place]) 04/17/2015  ? Elevated troponin 04/17/2015  ? DM type 2 causing CKD stage 3 ([place]) 04/17/2015  ? Essential hypertension 04/17/2015  ? ? ?No Known Allergies ? ?Past Surgical History:  ?Procedure Laterality Date  ? CARDIAC CATHETERIZATION N/A 04/18/2015  ? Procedure: Left Heart Cath and Coronary Angiography;  Surgeon: Yolonda Kida, MD;  Location: [place] [facility];  Service: Cardiovascular;  Laterality: N/A;  ? COLONOSCOPY  2014  ? cleared for 10 yrs  ? CORONARY ARTERY BYPASS GRAFT N/A 04/21/2015  ? Procedure: CORONARY ARTERY BYPASS GRAFTING (CABG) X 4 UTILIZING THE LEFT INTERNAL MAMMARY ARTERY TO LAD, ENDOSCOPICALLY HARVESTED RIGHT SAPHENEOUS VEINS TO PD,DIAGONAL AND CIRCUMFLEX.;  Surgeon: Grace Isaac, MD;  Location: [place];  Service: Open Heart Surgery;  Laterality: N/A;  ? DUPUYTREN CONTRACTURE RELEASE Left ~ 2000  ? EYE  SURGERY Bilateral   ? cataract removed  ? INCISION AND DRAINAGE Left 07/18/2020  ? Procedure: INCISION AND DRAINAGE LEFT KNEE;  Surgeon: Renee Harder, MD;  Location: [facility];  Service: Orthopedics;  Laterality: Left;  ? KNEE ARTHROSCOPY Right ~ 1995  ? TEE WITHOUT CARDIOVERSION N/A 04/21/2015  ? Procedure: TRANSESOPHAGEAL ECHOCARDIOGRAM (TEE);  Surgeon: Grace Isaac, MD;  Location: [place];  Service: Open Heart Surgery;  Laterality: N/A;  ? ? ?Social History  ? ?Tobacco Use  ? Smoking status: Never  ? Smokeless tobacco: Never  ?Vaping Use  ? Vaping Use: Never used  ?Substance Use Topics  ? Alcohol use: Not Currently  ?  Alcohol/week: 0.0 standard drinks  ? Drug use: No  ? ? ? ?Medication list has been reviewed and updated. ? ?No outpatient medications have been marked as taking for the 07/20/21 encounter (Office Visit) with Juline Patch, MD.  ? ? ? ?  07/20/2021  ?  9:11 AM 08/24/2020  ?  8:14 AM 05/09/2020  ?  7:57 AM 01/17/2020  ?  8:20 AM  ?GAD 7 : Generalized Anxiety Score  ?Nervous, Anxious, on Edge 0 0 0 0  ?Control/stop worrying 0 0 0 0  ?Worry too much - different things 0 0 0 0  ?Trouble relaxing 0 0 0 0  ?Restless 0 0 0 0  ?Easily annoyed or irritable 0 0 0 0  ?Afraid - awful might happen  0 0 0 0  ?Total GAD 7 Score 0 0 0 0  ?Anxiety Difficulty Not difficult at all Not difficult at all    ? ? ? ?  07/20/2021  ?  9:11 AM  ?Depression screen PHQ 2/9  ?Decreased Interest 0  ?Down, Depressed, Hopeless 0  ?PHQ - 2 Score 0  ?Altered sleeping 0  ?Tired, decreased energy 0  ?Change in appetite 0  ?Feeling bad or failure about yourself  0  ?Trouble concentrating 0  ?Moving slowly or fidgety/restless 0  ?Suicidal thoughts 0  ?PHQ-9 Score 0  ?Difficult doing work/chores Not difficult at all  ? ? ?BP Readings from Last 3 Encounters:  ?07/20/21 120/70  ?05/07/21 122/62  ?03/20/21 110/80  ? ? ?Physical Exam ?HENT:  ?   Head: Normocephalic.  ?   Right Ear: External ear normal.  ?   Left Ear: External ear normal.  ?    Nose: Nose normal.  ?Eyes:  ?   General: No scleral icterus.    ?   Right eye: No discharge.     ?   Left eye: No discharge.  ?   Conjunctiva/sclera: Conjunctivae normal.  ?   Pupils: Pupils are equal, round, and re

## 2021-07-23 DIAGNOSIS — E1159 Type 2 diabetes mellitus with other circulatory complications: Secondary | ICD-10-CM | POA: Diagnosis not present

## 2021-07-24 DIAGNOSIS — R809 Proteinuria, unspecified: Secondary | ICD-10-CM | POA: Diagnosis not present

## 2021-07-24 DIAGNOSIS — N1832 Chronic kidney disease, stage 3b: Secondary | ICD-10-CM | POA: Diagnosis not present

## 2021-07-24 DIAGNOSIS — D631 Anemia in chronic kidney disease: Secondary | ICD-10-CM | POA: Diagnosis not present

## 2021-07-24 DIAGNOSIS — E1122 Type 2 diabetes mellitus with diabetic chronic kidney disease: Secondary | ICD-10-CM | POA: Diagnosis not present

## 2021-07-24 DIAGNOSIS — I10 Essential (primary) hypertension: Secondary | ICD-10-CM | POA: Diagnosis not present

## 2021-07-24 LAB — RENAL FUNCTION PANEL
Albumin: 4.4 g/dL (ref 3.7–4.7)
BUN/Creatinine Ratio: 14 (ref 10–24)
BUN: 24 mg/dL (ref 8–27)
CO2: 24 mmol/L (ref 20–29)
Calcium: 9.6 mg/dL (ref 8.6–10.2)
Chloride: 103 mmol/L (ref 96–106)
Creatinine, Ser: 1.68 mg/dL — ABNORMAL HIGH (ref 0.76–1.27)
Glucose: 100 mg/dL — ABNORMAL HIGH (ref 70–99)
Phosphorus: 3.3 mg/dL (ref 2.8–4.1)
Potassium: 4.9 mmol/L (ref 3.5–5.2)
Sodium: 138 mmol/L (ref 134–144)
eGFR: 42 mL/min/{1.73_m2} — ABNORMAL LOW (ref >59)

## 2021-07-24 LAB — HEMOGLOBIN A1C
Est. average glucose Bld gHb Est-mCnc: 151 mg/dL
Hgb A1c MFr Bld: 6.9 % — ABNORMAL HIGH (ref 4.8–5.6)

## 2021-08-07 DIAGNOSIS — I10 Essential (primary) hypertension: Secondary | ICD-10-CM | POA: Diagnosis not present

## 2021-08-07 DIAGNOSIS — R0602 Shortness of breath: Secondary | ICD-10-CM | POA: Insufficient documentation

## 2021-08-07 DIAGNOSIS — I447 Left bundle-branch block, unspecified: Secondary | ICD-10-CM | POA: Diagnosis not present

## 2021-08-07 DIAGNOSIS — I2510 Atherosclerotic heart disease of native coronary artery without angina pectoris: Secondary | ICD-10-CM | POA: Diagnosis not present

## 2021-08-07 DIAGNOSIS — E1159 Type 2 diabetes mellitus with other circulatory complications: Secondary | ICD-10-CM | POA: Diagnosis not present

## 2021-08-07 DIAGNOSIS — I25810 Atherosclerosis of coronary artery bypass graft(s) without angina pectoris: Secondary | ICD-10-CM | POA: Diagnosis not present

## 2021-08-07 DIAGNOSIS — R0789 Other chest pain: Secondary | ICD-10-CM | POA: Diagnosis not present

## 2021-08-08 DIAGNOSIS — I288 Other diseases of pulmonary vessels: Secondary | ICD-10-CM

## 2021-08-08 DIAGNOSIS — I77810 Thoracic aortic ectasia: Secondary | ICD-10-CM

## 2021-08-08 DIAGNOSIS — I447 Left bundle-branch block, unspecified: Secondary | ICD-10-CM | POA: Diagnosis not present

## 2021-08-08 DIAGNOSIS — R0602 Shortness of breath: Secondary | ICD-10-CM | POA: Diagnosis not present

## 2021-08-08 HISTORY — DX: Thoracic aortic ectasia: I77.810

## 2021-08-08 HISTORY — DX: Other diseases of pulmonary vessels: I28.8

## 2021-08-13 DIAGNOSIS — R0789 Other chest pain: Secondary | ICD-10-CM | POA: Diagnosis not present

## 2021-08-13 DIAGNOSIS — I447 Left bundle-branch block, unspecified: Secondary | ICD-10-CM | POA: Diagnosis not present

## 2021-08-13 DIAGNOSIS — I255 Ischemic cardiomyopathy: Secondary | ICD-10-CM

## 2021-08-13 HISTORY — DX: Ischemic cardiomyopathy: I25.5

## 2021-08-24 DIAGNOSIS — N1832 Chronic kidney disease, stage 3b: Secondary | ICD-10-CM | POA: Diagnosis not present

## 2021-08-24 DIAGNOSIS — Z951 Presence of aortocoronary bypass graft: Secondary | ICD-10-CM | POA: Diagnosis not present

## 2021-08-24 DIAGNOSIS — E119 Type 2 diabetes mellitus without complications: Secondary | ICD-10-CM | POA: Diagnosis not present

## 2021-08-24 DIAGNOSIS — I10 Essential (primary) hypertension: Secondary | ICD-10-CM | POA: Diagnosis not present

## 2021-08-24 DIAGNOSIS — I25118 Atherosclerotic heart disease of native coronary artery with other forms of angina pectoris: Secondary | ICD-10-CM | POA: Diagnosis not present

## 2021-08-24 DIAGNOSIS — R0609 Other forms of dyspnea: Secondary | ICD-10-CM | POA: Diagnosis not present

## 2021-08-31 ENCOUNTER — Other Ambulatory Visit: Payer: Self-pay | Admitting: Family Medicine

## 2021-08-31 DIAGNOSIS — E1159 Type 2 diabetes mellitus with other circulatory complications: Secondary | ICD-10-CM

## 2021-10-03 DIAGNOSIS — I25118 Atherosclerotic heart disease of native coronary artery with other forms of angina pectoris: Secondary | ICD-10-CM | POA: Diagnosis not present

## 2021-10-03 DIAGNOSIS — E1159 Type 2 diabetes mellitus with other circulatory complications: Secondary | ICD-10-CM | POA: Diagnosis not present

## 2021-10-03 DIAGNOSIS — Z951 Presence of aortocoronary bypass graft: Secondary | ICD-10-CM | POA: Diagnosis not present

## 2021-10-03 DIAGNOSIS — I447 Left bundle-branch block, unspecified: Secondary | ICD-10-CM | POA: Diagnosis not present

## 2021-10-03 DIAGNOSIS — R0602 Shortness of breath: Secondary | ICD-10-CM | POA: Diagnosis not present

## 2021-10-03 DIAGNOSIS — I25810 Atherosclerosis of coronary artery bypass graft(s) without angina pectoris: Secondary | ICD-10-CM | POA: Diagnosis not present

## 2021-10-03 DIAGNOSIS — I2510 Atherosclerotic heart disease of native coronary artery without angina pectoris: Secondary | ICD-10-CM | POA: Diagnosis not present

## 2021-10-03 DIAGNOSIS — I10 Essential (primary) hypertension: Secondary | ICD-10-CM | POA: Diagnosis not present

## 2021-10-12 ENCOUNTER — Other Ambulatory Visit: Payer: Self-pay | Admitting: Family Medicine

## 2021-10-12 DIAGNOSIS — I10 Essential (primary) hypertension: Secondary | ICD-10-CM

## 2021-11-19 DIAGNOSIS — L570 Actinic keratosis: Secondary | ICD-10-CM | POA: Diagnosis not present

## 2021-11-19 DIAGNOSIS — L578 Other skin changes due to chronic exposure to nonionizing radiation: Secondary | ICD-10-CM | POA: Diagnosis not present

## 2021-11-19 DIAGNOSIS — Z872 Personal history of diseases of the skin and subcutaneous tissue: Secondary | ICD-10-CM | POA: Diagnosis not present

## 2021-11-26 DIAGNOSIS — D631 Anemia in chronic kidney disease: Secondary | ICD-10-CM | POA: Diagnosis not present

## 2021-11-26 DIAGNOSIS — N1832 Chronic kidney disease, stage 3b: Secondary | ICD-10-CM | POA: Diagnosis not present

## 2021-11-26 DIAGNOSIS — I10 Essential (primary) hypertension: Secondary | ICD-10-CM | POA: Diagnosis not present

## 2021-11-26 DIAGNOSIS — E1122 Type 2 diabetes mellitus with diabetic chronic kidney disease: Secondary | ICD-10-CM | POA: Diagnosis not present

## 2021-12-07 ENCOUNTER — Other Ambulatory Visit: Payer: Self-pay | Admitting: Family Medicine

## 2021-12-07 DIAGNOSIS — E1169 Type 2 diabetes mellitus with other specified complication: Secondary | ICD-10-CM

## 2021-12-09 ENCOUNTER — Other Ambulatory Visit: Payer: Self-pay | Admitting: Family Medicine

## 2021-12-09 DIAGNOSIS — E1159 Type 2 diabetes mellitus with other circulatory complications: Secondary | ICD-10-CM

## 2021-12-11 NOTE — Telephone Encounter (Signed)
Requested Prescriptions  Pending Prescriptions Disp Refills  . glipiZIDE (GLUCOTROL) 10 MG tablet [Pharmacy Med Name: GLIPIZIDE 10 MG TABLET] 180 tablet 0    Sig: TAKE 1 TABLET (10 MG TOTAL) BY MOUTH TWICE A DAY BEFORE A MEAL     Endocrinology:  Diabetes - Sulfonylureas Failed - 12/09/2021  9:31 AM      Failed - Cr in normal range and within 360 days    Creatinine  Date Value Ref Range Status  07/16/2011 1.00 0.60 - 1.30 mg/dL Final   Creatinine, Ser  Date Value Ref Range Status  07/23/2021 1.68 (H) 0.76 - 1.27 mg/dL Final         Passed - HBA1C is between 0 and 7.9 and within 180 days    Hgb A1c MFr Bld  Date Value Ref Range Status  07/23/2021 6.9 (H) 4.8 - 5.6 % Final    Comment:             Prediabetes: 5.7 - 6.4          Diabetes: >6.4          Glycemic control for adults with diabetes: <7.0          Passed - Valid encounter within last 6 months    Recent Outpatient Visits          4 months ago Type 2 diabetes mellitus with other circulatory complication, without long-term current use of insulin ([place])   [place] [facility] at [facility], MD   8 months ago Type 2 diabetes mellitus with other circulatory complication, without long-term current use of insulin ([place])   [place] [facility] at [facility], MD   1 year ago Type 2 diabetes mellitus with other circulatory complication, without long-term current use of insulin ([place])   [place] [facility] at [facility], MD   1 year ago Type 2 diabetes mellitus with other circulatory complication, without long-term current use of insulin ([place])   [place] [facility] at [facility], MD   1 year ago Type 2 diabetes mellitus with other circulatory complication, without long-term current use of insulin ([place])   [place] [facility] at  [place], [place], MD      Future Appointments            In 1 month Juline Patch, MD [facility] at [facility]

## 2022-01-01 ENCOUNTER — Other Ambulatory Visit: Payer: Self-pay | Admitting: Family Medicine

## 2022-01-01 DIAGNOSIS — E1159 Type 2 diabetes mellitus with other circulatory complications: Secondary | ICD-10-CM

## 2022-01-22 ENCOUNTER — Ambulatory Visit (INDEPENDENT_AMBULATORY_CARE_PROVIDER_SITE_OTHER): Payer: Medicare Other | Admitting: Family Medicine

## 2022-01-22 ENCOUNTER — Telehealth: Payer: Self-pay

## 2022-01-22 ENCOUNTER — Encounter: Payer: Self-pay | Admitting: Family Medicine

## 2022-01-22 VITALS — BP 80/48 | HR 68 | Ht 70.0 in | Wt 195.0 lb

## 2022-01-22 DIAGNOSIS — E785 Hyperlipidemia, unspecified: Secondary | ICD-10-CM

## 2022-01-22 DIAGNOSIS — I10 Essential (primary) hypertension: Secondary | ICD-10-CM | POA: Diagnosis not present

## 2022-01-22 DIAGNOSIS — E1169 Type 2 diabetes mellitus with other specified complication: Secondary | ICD-10-CM

## 2022-01-22 DIAGNOSIS — I25810 Atherosclerosis of coronary artery bypass graft(s) without angina pectoris: Secondary | ICD-10-CM

## 2022-01-22 DIAGNOSIS — E1159 Type 2 diabetes mellitus with other circulatory complications: Secondary | ICD-10-CM | POA: Diagnosis not present

## 2022-01-22 MED ORDER — LOSARTAN POTASSIUM 50 MG PO TABS: 50.0000 mg | ORAL_TABLET | Freq: Every day | ORAL | 1 refills | Status: DC

## 2022-01-22 MED ORDER — METFORMIN HCL 500 MG PO TABS: 500.0000 mg | ORAL_TABLET | Freq: Two times a day (BID) | ORAL | 1 refills | Status: DC

## 2022-01-22 MED ORDER — ATORVASTATIN CALCIUM 40 MG PO TABS: 40.0000 mg | ORAL_TABLET | Freq: Every day | ORAL | 1 refills | Status: DC

## 2022-01-22 MED ORDER — GLIPIZIDE 10 MG PO TABS: ORAL_TABLET | ORAL | 1 refills | Status: DC

## 2022-01-22 NOTE — Telephone Encounter (Signed)
Spoke to pt after talking to cardio nurse Heather. Per heather Dr Ubaldo Glassing does not want to make any changes on medicine at this time and will see him, as planned, in November. Pt stated he checked his bp after lunch and it was 114/80. Advised pt to drink plenty of fluids to stay hydrated and to let us know if he becomes symptomatic, other than the dizziness "occasionally."

## 2022-01-22 NOTE — Progress Notes (Addendum)
Date:  01/22/2022   Name:  [name]   DOB:  [birth year]   MRN:  [id number]   Chief Complaint: Hyperlipidemia, Hypertension, Diabetes, and Cough (Cong and phlegm in back of throat)  Hyperlipidemia This is a chronic problem. The current episode started more than 1 year ago. The problem is controlled. Recent lipid tests were reviewed and are normal. He has no history of chronic renal disease. Pertinent negatives include no chest pain, focal sensory loss, focal weakness, leg pain, myalgias or shortness of breath. The current treatment provides moderate improvement of lipids. There are no compliance problems.   Hypertension This is a chronic problem. The current episode started more than 1 year ago. The problem is controlled. Pertinent negatives include no blurred vision, chest pain, headaches, neck pain, palpitations or shortness of breath. There are no associated agents to hypertension. Hypertensive end-organ damage includes angina. There is no history of kidney disease, CAD/MI, CVA, heart failure, left ventricular hypertrophy, PVD or retinopathy. There is no history of chronic renal disease, a hypertension causing med or renovascular disease.  Diabetes He presents for his follow-up diabetic visit. He has type 2 diabetes mellitus. Pertinent negatives for hypoglycemia include no dizziness, headaches or nervousness/anxiousness. Pertinent negatives for diabetes include no blurred vision, no chest pain, no fatigue, no foot paresthesias, no polydipsia, no polyuria and no weight loss. There are no hypoglycemic complications. Symptoms are stable. There are no diabetic complications. Pertinent negatives for diabetic complications include no CVA, PVD or retinopathy. Risk factors for coronary artery disease include diabetes mellitus and dyslipidemia. He has not had a previous visit with a dietitian. An ACE inhibitor/angiotensin II receptor blocker is being taken.  Cough Pertinent negatives include no  chest pain, chills, ear pain, fever, headaches, myalgias, rash, sore throat, shortness of breath, weight loss or wheezing. There is no history of environmental allergies.    Lab Results  Component Value Date   NA 138 07/23/2021   K 4.9 07/23/2021   CO2 24 07/23/2021   GLUCOSE 100 (H) 07/23/2021   BUN 24 07/23/2021   CREATININE 1.68 (H) 07/23/2021   CALCIUM 9.6 07/23/2021   EGFR 42 (L) 07/23/2021   GFRNONAA 40 (L) 07/19/2020   Lab Results  Component Value Date   CHOL 162 03/20/2021   HDL 40 03/20/2021   LDLCALC 102 (H) 03/20/2021   TRIG 111 03/20/2021   CHOLHDL 4.6 11/05/2017   Lab Results  Component Value Date   TSH 5.153 (H) 04/25/2015   Lab Results  Component Value Date   HGBA1C 6.9 (H) 07/23/2021   Lab Results  Component Value Date   WBC 6.3 07/19/2020   HGB 9.4 (L) 07/19/2020   HCT 29.9 (L) 07/19/2020   MCV 62.7 (L) 07/19/2020   PLT 104 (L) 07/19/2020   Lab Results  Component Value Date   ALT 16 07/19/2020   AST 19 07/19/2020   ALKPHOS 49 07/19/2020   BILITOT 1.0 07/19/2020   No results found for: "25OHVITD2", "25OHVITD3", "VD25OH"   Review of Systems  Constitutional:  Negative for chills, fatigue, fever and weight loss.  HENT:  Negative for drooling, ear discharge, ear pain, sinus pain and sore throat.   Eyes:  Negative for blurred vision.  Respiratory:  Positive for cough. Negative for shortness of breath and wheezing.   Cardiovascular:  Negative for chest pain, palpitations and leg swelling.  Gastrointestinal:  Negative for abdominal pain, blood in stool, constipation, diarrhea and nausea.  Endocrine: Negative for polydipsia and polyuria.  Genitourinary:  Negative for dysuria, frequency, hematuria and urgency.  Musculoskeletal:  Negative for back pain, myalgias and neck pain.  Skin:  Negative for rash.  Allergic/Immunologic: Negative for environmental allergies.  Neurological:  Negative for dizziness, focal weakness and headaches.  Hematological:   Does not bruise/bleed easily.  Psychiatric/Behavioral:  Negative for suicidal ideas. The patient is not nervous/anxious.     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/21/2015   Thrombocytopenia ([place]) 04/20/2015   Coronary artery disease 04/18/2015   Unstable angina ([place]) 04/17/2015   Elevated troponin 04/17/2015   DM type 2 causing CKD stage 3 ([place]) 04/17/2015   Essential hypertension 04/17/2015    No Known Allergies  Past Surgical History:  Procedure Laterality Date   CARDIAC CATHETERIZATION N/A 04/18/2015   Procedure: Left Heart Cath and Coronary Angiography;  Surgeon: Yolonda Kida, MD;  Location: [place] [facility];  Service: Cardiovascular;  Laterality: N/A;   COLONOSCOPY  2014   cleared for 10 yrs   CORONARY ARTERY BYPASS GRAFT N/A 04/21/2015   Procedure: CORONARY ARTERY BYPASS GRAFTING (CABG) X 4 UTILIZING THE LEFT INTERNAL MAMMARY ARTERY TO LAD, ENDOSCOPICALLY HARVESTED RIGHT SAPHENEOUS VEINS TO PD,DIAGONAL AND CIRCUMFLEX.;  Surgeon: Grace Isaac, MD;  Location: [place];  Service: Open Heart Surgery;  Laterality: N/A;   DUPUYTREN CONTRACTURE RELEASE Left ~ 2000   EYE SURGERY Bilateral    cataract removed   INCISION AND DRAINAGE Left 07/18/2020   Procedure: INCISION AND DRAINAGE LEFT KNEE;  Surgeon: Renee Harder, MD;  Location: [facility];  Service: Orthopedics;  Laterality: Left;   KNEE ARTHROSCOPY Right ~ 1995   TEE WITHOUT CARDIOVERSION N/A  04/21/2015   Procedure: TRANSESOPHAGEAL ECHOCARDIOGRAM (TEE);  Surgeon: Grace Isaac, MD;  Location: [place];  Service: Open Heart Surgery;  Laterality: N/A;    Social History   Tobacco Use   Smoking status: Never   Smokeless tobacco: Never  Vaping Use   Vaping Use: Never used  Substance Use Topics   Alcohol use: Not Currently    Alcohol/week: 0.0 standard drinks of alcohol   Drug use: No     Medication list has been reviewed and updated.  Current Meds  Medication Sig   acetaminophen (TYLENOL) 500 MG tablet Take 2 tablets (1,000 mg total) by mouth every 8 (eight) hours.   aspirin 81 MG tablet Take 1 tablet (81 mg total) by mouth daily.   atorvastatin (LIPITOR) 40 MG tablet TAKE 1 TABLET BY MOUTH EVERY DAY   dapagliflozin propanediol (FARXIGA) 10 MG TABS tablet Take 10 mg by mouth daily. lateef   FREESTYLE LITE test strip USE AS INSTRUCTED   glipiZIDE (GLUCOTROL) 10 MG tablet TAKE 1 TABLET (10 MG TOTAL) BY MOUTH TWICE A DAY BEFORE A MEAL   isosorbide mononitrate (IMDUR) 60 MG 24 hr tablet Take 60 mg by mouth daily. cardio   losartan (COZAAR) 50 MG tablet TAKE 1 TABLET BY MOUTH EVERY DAY   metFORMIN (GLUCOPHAGE)  500 MG tablet TAKE 1 TABLET BY MOUTH 2 TIMES DAILY WITH A MEAL.       01/22/2022   10:19 AM 07/20/2021    9:11 AM 08/24/2020    8:14 AM 05/09/2020    7:57 AM  GAD 7 : Generalized Anxiety Score  Nervous, Anxious, on Edge 0 0 0 0  Control/stop worrying 0 0 0 0  Worry too much - different things 0 0 0 0  Trouble relaxing 0 0 0 0  Restless 0 0 0 0  Easily annoyed or irritable 0 0 0 0  Afraid - awful might happen 0 0 0 0  Total GAD 7 Score 0 0 0 0  Anxiety Difficulty Not difficult at all Not difficult at all Not difficult at all        01/22/2022   10:19 AM 07/20/2021    9:11 AM 05/07/2021   10:05 AM  Depression screen PHQ 2/9  Decreased Interest 0 0 0  Down, Depressed, Hopeless 0 0 0  PHQ - 2 Score 0 0 0  Altered sleeping 0 0   Tired, decreased energy 0 0    Change in appetite 0 0   Feeling bad or failure about yourself  0 0   Trouble concentrating 0 0   Moving slowly or fidgety/restless 0 0   Suicidal thoughts 0 0   PHQ-9 Score 0 0   Difficult doing work/chores Not difficult at all Not difficult at all     BP Readings from Last 3 Encounters:  01/22/22 (!) 80/48  07/20/21 120/70  05/07/21 122/62    Physical Exam Vitals and nursing note reviewed.  HENT:     Head: Normocephalic.     Right Ear: External ear normal.     Left Ear: External ear normal.     Nose: Nose normal.  Eyes:     General: No scleral icterus.       Right eye: No discharge.        Left eye: No discharge.     Conjunctiva/sclera: Conjunctivae normal.     Pupils: Pupils are equal, round, and reactive to light.  Neck:     Thyroid: No thyromegaly.     Vascular: No JVD.     Trachea: No tracheal deviation.  Cardiovascular:     Rate and Rhythm: Normal rate and regular rhythm.     Heart sounds: Normal heart sounds. No murmur heard.    No friction rub. No gallop.  Pulmonary:     Effort: No respiratory distress.     Breath sounds: Normal breath sounds. No wheezing or rales.  Abdominal:     General: Bowel sounds are normal.     Palpations: Abdomen is soft. There is no mass.     Tenderness: There is no abdominal tenderness. There is no guarding or rebound.  Musculoskeletal:        General: No tenderness. Normal range of motion.     Cervical back: Normal range of motion and neck supple.  Lymphadenopathy:     Cervical: No cervical adenopathy.  Skin:    General: Skin is warm.     Findings: No rash.  Neurological:     Mental Status: He is alert and oriented to person, place, and time.     Cranial Nerves: No cranial nerve deficit.     Deep Tendon Reflexes: Reflexes are normal and symmetric.     Wt Readings from Last 3 Encounters:  01/22/22 195 lb (88.5 kg)  07/20/21 195  lb (88.5 kg)  05/07/21 198 lb 12.8 oz (90.2 kg)    BP (!) 80/48 (BP Location: Right Arm,  Cuff Size: Large)   Pulse 68   Ht '5\' 10"'  (1.778 m)   Wt 195 lb (88.5 kg)   BMI 27.98 kg/m   Assessment and Plan:  1. Hyperlipidemia associated with type 2 diabetes mellitus (HCC) Chronic.  Controlled.  Stable.  Continue atorvastatin 40 mg once a day.  We will check lipid panel. - atorvastatin (LIPITOR) 40 MG tablet; Take 1 tablet (40 mg total) by mouth daily.  Dispense: 90 tablet; Refill: 1 - Lipid Panel With LDL/HDL Ratio  2. Type 2 diabetes mellitus with other circulatory complication, without long-term current use of insulin (HCC) Chronic.  Controlled.  Stable.  Continue glipizide 10 mg 1 twice a day and metformin 500 mg twice a day.  We will check CMP A1c and microalbuminuria. - glipiZIDE (GLUCOTROL) 10 MG tablet; TAKE 1 TABLET (10 MG TOTAL) BY MOUTH TWICE A DAY BEFORE A MEAL  Dispense: 180 tablet; Refill: 1 - metFORMIN (GLUCOPHAGE) 500 MG tablet; Take 1 tablet (500 mg total) by mouth 2 (two) times daily with a meal.  Dispense: 180 tablet; Refill: 1 - Comprehensive Metabolic Panel (CMET) - HgB A1c - Urine microalbumin-creatinine with uACR  3. Essential hypertension Chronic.  Controlled.  Patient is having some symptomatology with change in orthostatic position with a blood pressure reading of 80/48.  We have contacted cardiology about if changes need to be made on current dosing in the meantime I have refilled my losartan 50 mg but patient is also on isosorbide 60 mg once a day. - losartan (COZAAR) 50 MG tablet; Take 1 tablet (50 mg total) by mouth daily.  Dispense: 90 tablet; Refill: 1 - Comprehensive Metabolic Panel (CMET)    Otilio Miu, MD

## 2022-01-23 LAB — LIPID PANEL WITH LDL/HDL RATIO
Cholesterol, Total: 163 mg/dL (ref 100–199)
HDL: 40 mg/dL (ref >39)
LDL Chol Calc (NIH): 101 mg/dL — ABNORMAL HIGH (ref 0–99)
LDL/HDL Ratio: 2.5 ratio (ref 0.0–3.6)
Triglycerides: 122 mg/dL (ref 0–149)
VLDL Cholesterol Cal: 22 mg/dL (ref 5–40)

## 2022-01-23 LAB — COMPREHENSIVE METABOLIC PANEL
ALT: 22 IU/L (ref 0–44)
AST: 20 IU/L (ref 0–40)
Albumin/Globulin Ratio: 2 (ref 1.2–2.2)
Albumin: 4.9 g/dL — ABNORMAL HIGH (ref 3.8–4.8)
Alkaline Phosphatase: 84 IU/L (ref 44–121)
BUN/Creatinine Ratio: 17 (ref 10–24)
BUN: 30 mg/dL — ABNORMAL HIGH (ref 8–27)
Bilirubin Total: 1.1 mg/dL (ref 0.0–1.2)
CO2: 21 mmol/L (ref 20–29)
Calcium: 9.9 mg/dL (ref 8.6–10.2)
Chloride: 103 mmol/L (ref 96–106)
Creatinine, Ser: 1.76 mg/dL — ABNORMAL HIGH (ref 0.76–1.27)
Globulin, Total: 2.4 g/dL (ref 1.5–4.5)
Glucose: 164 mg/dL — ABNORMAL HIGH (ref 70–99)
Potassium: 4.9 mmol/L (ref 3.5–5.2)
Sodium: 139 mmol/L (ref 134–144)
Total Protein: 7.3 g/dL (ref 6.0–8.5)
eGFR: 40 mL/min/{1.73_m2} — ABNORMAL LOW (ref >59)

## 2022-01-23 LAB — HEMOGLOBIN A1C
Est. average glucose Bld gHb Est-mCnc: 177 mg/dL
Hgb A1c MFr Bld: 7.8 % — ABNORMAL HIGH (ref 4.8–5.6)

## 2022-01-23 LAB — MICROALBUMIN / CREATININE URINE RATIO
Creatinine, Urine: 95.9 mg/dL
Microalb/Creat Ratio: 26 mg/g creat (ref 0–29)
Microalbumin, Urine: 25.3 ug/mL

## 2022-01-24 ENCOUNTER — Other Ambulatory Visit: Payer: Self-pay

## 2022-01-24 DIAGNOSIS — E1159 Type 2 diabetes mellitus with other circulatory complications: Secondary | ICD-10-CM

## 2022-01-24 NOTE — Progress Notes (Signed)
Sent ref to endo

## 2022-02-12 DIAGNOSIS — I447 Left bundle-branch block, unspecified: Secondary | ICD-10-CM | POA: Diagnosis not present

## 2022-02-12 DIAGNOSIS — I25118 Atherosclerotic heart disease of native coronary artery with other forms of angina pectoris: Secondary | ICD-10-CM | POA: Diagnosis not present

## 2022-02-12 DIAGNOSIS — I2510 Atherosclerotic heart disease of native coronary artery without angina pectoris: Secondary | ICD-10-CM | POA: Diagnosis not present

## 2022-02-12 DIAGNOSIS — E1159 Type 2 diabetes mellitus with other circulatory complications: Secondary | ICD-10-CM | POA: Diagnosis not present

## 2022-02-12 DIAGNOSIS — N1831 Chronic kidney disease, stage 3a: Secondary | ICD-10-CM | POA: Diagnosis not present

## 2022-02-12 DIAGNOSIS — I10 Essential (primary) hypertension: Secondary | ICD-10-CM | POA: Diagnosis not present

## 2022-02-12 DIAGNOSIS — I25810 Atherosclerosis of coronary artery bypass graft(s) without angina pectoris: Secondary | ICD-10-CM | POA: Diagnosis not present

## 2022-02-15 DIAGNOSIS — I2510 Atherosclerotic heart disease of native coronary artery without angina pectoris: Secondary | ICD-10-CM | POA: Diagnosis not present

## 2022-02-15 DIAGNOSIS — E7800 Pure hypercholesterolemia, unspecified: Secondary | ICD-10-CM | POA: Diagnosis not present

## 2022-02-15 LAB — TSH
TSH: 1 (ref 0.41–5.90)
TSH: 3.48 (ref 0.41–5.90)

## 2022-03-08 DIAGNOSIS — Z23 Encounter for immunization: Secondary | ICD-10-CM | POA: Diagnosis not present

## 2022-03-28 DIAGNOSIS — N1832 Chronic kidney disease, stage 3b: Secondary | ICD-10-CM | POA: Diagnosis not present

## 2022-03-28 DIAGNOSIS — E1122 Type 2 diabetes mellitus with diabetic chronic kidney disease: Secondary | ICD-10-CM | POA: Diagnosis not present

## 2022-03-28 DIAGNOSIS — D631 Anemia in chronic kidney disease: Secondary | ICD-10-CM | POA: Diagnosis not present

## 2022-03-28 DIAGNOSIS — I10 Essential (primary) hypertension: Secondary | ICD-10-CM | POA: Diagnosis not present

## 2022-03-28 LAB — BASIC METABOLIC PANEL
BUN: 27 — AB (ref 4–21)
Creatinine: 1.4 — AB (ref 0.6–1.3)
Glucose: 105

## 2022-03-28 LAB — MICROALBUMIN / CREATININE URINE RATIO: Microalb Creat Ratio: 47

## 2022-04-22 ENCOUNTER — Other Ambulatory Visit: Payer: Self-pay | Admitting: Family Medicine

## 2022-04-22 DIAGNOSIS — E1159 Type 2 diabetes mellitus with other circulatory complications: Secondary | ICD-10-CM

## 2022-04-26 DIAGNOSIS — I2510 Atherosclerotic heart disease of native coronary artery without angina pectoris: Secondary | ICD-10-CM | POA: Diagnosis not present

## 2022-04-26 DIAGNOSIS — E7800 Pure hypercholesterolemia, unspecified: Secondary | ICD-10-CM | POA: Diagnosis not present

## 2022-04-26 DIAGNOSIS — E1121 Type 2 diabetes mellitus with diabetic nephropathy: Secondary | ICD-10-CM | POA: Diagnosis not present

## 2022-04-26 LAB — LIPID PANEL
Cholesterol: 94 (ref 0–200)
HDL: 37 (ref 35–70)
LDL Cholesterol: 40
Triglycerides: 84 (ref 40–160)

## 2022-04-26 LAB — HEMOGLOBIN A1C: Hemoglobin A1C: 7.3

## 2022-05-08 ENCOUNTER — Ambulatory Visit: Payer: Medicare Other

## 2022-05-16 ENCOUNTER — Ambulatory Visit (INDEPENDENT_AMBULATORY_CARE_PROVIDER_SITE_OTHER): Payer: Medicare Other

## 2022-05-16 VITALS — BP 114/60 | Ht 70.0 in | Wt 197.4 lb

## 2022-05-16 DIAGNOSIS — Z0000 Encounter for general adult medical examination without abnormal findings: Secondary | ICD-10-CM

## 2022-05-16 NOTE — Progress Notes (Signed)
Subjective:   [name] is a 77 y.o. male who presents for Medicare Annual/Subsequent preventive examination.  Review of Systems     Cardiac Risk Factors include: advanced age (>65mn, >>3women);diabetes mellitus;dyslipidemia;hypertension;male gender     Objective:    Today's Vitals   05/16/22 0951  BP: 114/60  Weight: 197 lb 6.4 oz (89.5 kg)  Height: 5' 10"$  (1.778 m)   Body mass index is 28.32 kg/m.     05/16/2022   10:01 AM 05/07/2021   10:06 AM 07/19/2020    5:49 AM 07/18/2020    3:52 PM 05/03/2020    9:51 AM 05/03/2019    2:57 PM 03/20/2016    1:43 PM  Advanced Directives  Does Patient Have a Medical Advance Directive? Yes Yes Yes Yes Yes Yes Yes  Type of AParamed[place]of [place]each [place] will [place] will Living will Living will [place] will [place] will   Does patient want to make changes to medical advance directive? No - Patient declined  No - Patient declined      Copy of [place] Chart? Yes - validated most recent copy scanned in chart (See row information) Yes - validated most recent copy scanned in chart (See row information)   Yes - validated most recent copy scanned in chart (See row information) Yes - validated most recent copy scanned in chart (See row information)     Current Medications (verified) Outpatient Encounter Medications as of 05/16/2022  Medication Sig   acetaminophen (TYLENOL) 500 MG tablet Take 2 tablets (1,000 mg total) by mouth every 8 (eight) hours.   aspirin 81 MG tablet Take 1 tablet (81 mg total) by mouth daily.   atorvastatin (LIPITOR) 40 MG tablet Take 1 tablet (40 mg total) by mouth daily.   dapagliflozin propanediol (FARXIGA) 10 MG TABS tablet Take 10 mg by mouth daily. lateef   FREESTYLE LITE test strip USE AS INSTRUCTED   glipiZIDE (GLUCOTROL) 10 MG tablet TAKE 1 TABLET (10 MG TOTAL) BY MOUTH TWICE A  DAY BEFORE A MEAL   isosorbide mononitrate (IMDUR) 60 MG 24 hr tablet Take 60 mg by mouth daily. cardio   losartan (COZAAR) 50 MG tablet Take 1 tablet (50 mg total) by mouth daily.   metFORMIN (GLUCOPHAGE) 500 MG tablet Take 1 tablet (500 mg total) by mouth 2 (two) times daily with a meal.   Study - EVOLVE-MI - evolocumab (REPATHA) 140 mg/mL SQ injection (PI-Stuckey) Inject 140 mg into the skin every 14 (fourteen) days.   No facility-administered encounter medications on file as of 05/16/2022.    Allergies (verified) Patient has no known allergies.   History: Past Medical History:  Diagnosis Date   Coronary artery disease    Hyperlipidemia    Hypertension    Type II diabetes mellitus ([place]    Past Surgical History:  Procedure Laterality Date   CARDIAC CATHETERIZATION N/A 04/18/2015   Procedure: Left Heart Cath and Coronary Angiography;  Surgeon: DYolonda Kida MD;  [facility];  Service: Cardiovascular;  Laterality: N/A;   COLONOSCOPY  2014   cleared for 10 yrs   CORONARY ARTERY BYPASS GRAFT N/A 04/21/2015   Procedure: CORONARY ARTERY BYPASS GRAFTING (CABG) X 4 UTILIZING THE LEFT INTERNAL MAMMARY ARTERY TO LAD, ENDOSCOPICALLY HARVESTED RIGHT SAPHENEOUS VEINS TO PD,DIAGONAL AND CIRCUMFLEX.;  Surgeon: EGrace Isaac MD;  [place] [facility]  Service: Open Heart Surgery;  Laterality:  N/A;   DUPUYTREN CONTRACTURE RELEASE Left ~ 2000   EYE SURGERY Bilateral    cataract removed   INCISION AND DRAINAGE Left 07/18/2020   Procedure: INCISION AND DRAINAGE LEFT KNEE;  Surgeon: Renee Harder, MD;  Location: [facility];  Service: Orthopedics;  Laterality: Left;   KNEE ARTHROSCOPY Right ~ 1995   TEE WITHOUT CARDIOVERSION N/A 04/21/2015   Procedure: TRANSESOPHAGEAL ECHOCARDIOGRAM (TEE);  Surgeon: Grace Isaac, MD;  Location: [place];  Service: Open Heart Surgery;  Laterality: N/A;   Family History  Problem Relation Age of Onset   Diabetes Mother    Heart attack Other     Hypertension Other    Social History   Socioeconomic History   Marital status: Married    Spouse name: Not on file   Number of children: 1   Years of education: Not on file   Highest education level: Not on file  Occupational History   Not on file  Tobacco Use   Smoking status: Never   Smokeless tobacco: Never  Vaping Use   Vaping Use: Never used  Substance and Sexual Activity   Alcohol use: Not Currently    Alcohol/week: 0.0 standard drinks of alcohol   Drug use: No   Sexual activity: Not Currently  Other Topics Concern   Not on file  Social History Narrative   Not on file   Social Determinants of Health   Financial Resource Strain: Low Risk  (05/16/2022)   Overall Financial Resource Strain (CARDIA)    Difficulty of Paying Living Expenses: Not hard at all  Food Insecurity: No Food Insecurity (05/16/2022)   Hunger Vital Sign    Worried About Running Out of Food in the Last Year: Never true    [place] in the Last Year: Never true  Transportation Needs: No Transportation Needs (05/16/2022)   PRAPARE - Hydrologist (Medical): No    Lack of Transportation (Non-Medical): No  Physical Activity: Insufficiently Active (05/16/2022)   Exercise Vital Sign    Days of Exercise per Week: 3 days    Minutes of Exercise per Session: 30 min  Stress: No Stress Concern Present (05/16/2022)   [place]    Feeling of Stress : Not at all  Social Connections: Moderately Isolated (05/16/2022)   Social Connection and Isolation Panel [NHANES]    Frequency of Communication with Friends and Family: More than three times a week    Frequency of Social Gatherings with Friends and Family: More than three times a week    Attends Religious Services: Never    Marine scientist or Organizations: No    Attends Music therapist: Never    Marital Status: Married    Tobacco  Counseling Counseling given: Not Answered   Clinical Intake:  Pre-visit preparation completed: Yes  Pain : No/denies pain     Diabetes: Yes CBG done?: No Did pt. bring in CBG monitor from home?: No  How often do you need to have someone help you when you read instructions, pamphlets, or other written materials from your doctor or pharmacy?: 1 - Never  Diabetic?yes Nutrition Risk Assessment:  Has the patient had any N/V/D within the last 2 months?  No  Does the patient have any non-healing wounds?  No  Has the patient had any unintentional weight loss or weight gain?  No   Diabetes:  Is the patient diabetic?  Yes  If diabetic, was a CBG obtained today?  No  Did the patient bring in their glucometer from home?  No  How often do you monitor your CBG's? Every morning.   Financial Strains and Diabetes Management:  Are you having any financial strains with the device, your supplies or your medication? No .  Does the patient want to be seen by Chronic Care Management for management of their diabetes?  No  Would the patient like to be referred to a Nutritionist or for Diabetic Management?  No   Diabetic Exams:  Diabetic Eye Exam: Completed 07/20/21. Marland Kitchen Pt has been advised about the importance in completing this exam.  Diabetic Foot Exam: Completed 03/20/21. Pt has been advised about the importance in completing this exam.   Interpreter Needed?: No  Information entered by :: Kirke Shaggy, LPN   Activities of Daily Living    05/16/2022   10:02 AM  In your present state of health, do you have any difficulty performing the following activities:  Hearing? 0  Vision? 0  Difficulty concentrating or making decisions? 0  Walking or climbing stairs? 1  Dressing or bathing? 0  Doing errands, shopping? 0  Preparing Food and eating ? N  Using the Toilet? N  In the past six months, have you accidently leaked urine? N  Do you have problems with loss of bowel control? N  Managing  your Medications? N  Managing your Finances? N  Housekeeping or managing your Housekeeping? N    Patient Care Team: Juline Patch, MD as PCP - General (Family Medicine) Ubaldo Glassing Javier Docker, MD as Consulting Physician (Cardiology) Anthonette Legato, MD (Internal Medicine)  Indicate any recent Medical Services you may have received from other than [facility] providers in the past year (date may be approximate).     Assessment:   This is a routine wellness examination for Gwyndolyn Saxon.  Hearing/Vision screen Hearing Screening - Comments:: No aids Vision Screening - Comments:: Readers- Dr.Cheek  Dietary issues and exercise activities discussed: Current Exercise Habits: Home exercise routine, Type of exercise: walking, Time (Minutes): 30, Frequency (Times/Week): 3, Weekly Exercise (Minutes/Week): 90, Intensity: Mild   Goals Addressed             This Visit's Progress    DIET - EAT MORE FRUITS AND VEGETABLES         Depression Screen    05/16/2022   10:00 AM 01/22/2022   10:19 AM 07/20/2021    9:11 AM 05/07/2021   10:05 AM 08/24/2020    8:13 AM 05/09/2020    7:57 AM 05/03/2020    9:51 AM  PHQ 2/9 Scores  PHQ - 2 Score 0 0 0 0 0 0 0  PHQ- 9 Score 0 0 0  0 0     Fall Risk    05/16/2022   10:02 AM 01/22/2022   10:18 AM 05/07/2021   10:06 AM 08/24/2020    8:14 AM 05/03/2020    9:52 AM  Fall Risk   Falls in the past year? 0 0 0 0 0  Number falls in past yr: 0 0 0 0 0  Injury with Fall? 0 0 0 0 0  Risk for fall due to : No Fall Risks No Fall Risks No Fall Risks  No Fall Risks  Follow up Falls prevention discussed;Falls evaluation completed Falls evaluation completed Falls prevention discussed Falls evaluation completed Falls prevention discussed    FALL RISK PREVENTION PERTAINING TO THE HOME:  Any stairs in or around  the home? Yes  If so, are there any without handrails? No  Home free of loose throw rugs in walkways, pet beds, electrical cords, etc? Yes  Adequate lighting in your home to  reduce risk of falls? Yes   ASSISTIVE DEVICES UTILIZED TO PREVENT FALLS:  Life alert? No  Use of a cane, walker or w/c? No  Grab bars in the bathroom? No  Shower chair or bench in shower? Yes  Elevated toilet seat or a handicapped toilet? No   TIMED UP AND GO:  Was the test performed? Yes .  Length of time to ambulate 10 feet: 4 sec.   Gait steady and fast without use of assistive device  Cognitive Function:        05/16/2022   10:07 AM 05/03/2019    3:00 PM  6CIT Screen  What Year? 0 points 0 points  What month? 0 points 0 points  What time? 0 points 0 points  Count back from 20 0 points 0 points  Months in reverse 0 points 0 points  Repeat phrase 0 points 0 points  Total Score 0 points 0 points    Immunizations Immunization History  Administered Date(s) Administered   COVID-19, mRNA, vaccine(Comirnaty)12 years and older 03/08/2022   Fluad Quad(high Dose 65+) 03/08/2022   Influenza, High Dose Seasonal PF 03/03/2017, 01/10/2018   Influenza-Unspecified 01/01/2019, 01/10/2020, 01/07/2021   PFIZER(Purple Top)SARS-COV-2 Vaccination 05/22/2019, 06/12/2019, 01/18/2020, 12/26/2020   Pneumococcal Conjugate-13 12/01/2014   Pneumococcal Polysaccharide-23 05/08/2017   Tdap 04/09/2011   Zoster, Live 04/09/2011    TDAP status: Due, Education has been provided regarding the importance of this vaccine. Advised may receive this vaccine at local pharmacy or Health Dept. Aware to provide a copy of the vaccination record if obtained from local pharmacy or Health Dept. Verbalized acceptance and understanding.  Flu Vaccine status: Up to date  Pneumococcal vaccine status: Up to date  Covid-19 vaccine status: Completed vaccines  Qualifies for Shingles Vaccine? Yes   Zostavax completed Yes   Shingrix Completed?: No.    Education has been provided regarding the importance of this vaccine. Patient has been advised to call insurance company to determine out of pocket expense if they have  not yet received this vaccine. Advised may also receive vaccine at local pharmacy or Health Dept. Verbalized acceptance and understanding.  Screening Tests Health Maintenance  Topic Date Due   Zoster Vaccines- Shingrix (1 of 2) Never done   DTaP/Tdap/Td (2 - Td or Tdap) 04/08/2021   FOOT EXAM  03/20/2022   OPHTHALMOLOGY EXAM  07/21/2022   HEMOGLOBIN A1C  07/24/2022   Diabetic kidney evaluation - eGFR measurement  01/23/2023   Diabetic kidney evaluation - Urine ACR  01/23/2023   Medicare Annual Wellness (AWV)  05/17/2023   Pneumonia Vaccine 47+ Years old  Completed   INFLUENZA VACCINE  Completed   COVID-19 Vaccine  Completed   Hepatitis C Screening  Completed   HPV VACCINES  Aged Out   COLONOSCOPY (Pts 45-76yr Insurance coverage will need to be confirmed)  Discontinued    Health Maintenance  Health Maintenance Due  Topic Date Due   Zoster Vaccines- Shingrix (1 of 2) Never done   DTaP/Tdap/Td (2 - Td or Tdap) 04/08/2021   FOOT EXAM  03/20/2022    Colorectal cancer screening: No longer required.   Lung Cancer Screening: (Low Dose CT Chest recommended if Age 77-80years, 30 pack-year currently smoking OR have quit w/in 15years.) does not qualify.    Additional Screening:  Hepatitis C Screening: does qualify; Completed 05/08/17  Vision Screening: Recommended annual ophthalmology exams for early detection of glaucoma and other disorders of the eye. Is the patient up to date with their annual eye exam?  Yes  Who is the provider or what is the name of the office in which the patient attends annual eye exams? Dr.Cheek If pt is not established with a provider, would they like to be referred to a provider to establish care? No .   Dental Screening: Recommended annual dental exams for proper oral hygiene  Community Resource Referral / Chronic Care Management: CRR required this visit?  No   CCM required this visit?  No      Plan:     I have personally reviewed and noted the  following in the patient's chart:   Medical and social history Use of alcohol, tobacco or illicit drugs  Current medications and supplements including opioid prescriptions. Patient is not currently taking opioid prescriptions. Functional ability and status Nutritional status Physical activity Advanced directives List of other physicians Hospitalizations, surgeries, and ER visits in previous 12 months Vitals Screenings to include cognitive, depression, and falls Referrals and appointments  In addition, I have reviewed and discussed with patient certain preventive protocols, quality metrics, and best practice recommendations. A written personalized care plan for preventive services as well as general preventive health recommendations were provided to patient.     Dionisio David, LPN   [id number]   Nurse Notes: none

## 2022-05-16 NOTE — Patient Instructions (Signed)
[name] , Thank you for taking time to come for your Medicare Wellness Visit. I appreciate your ongoing commitment to your health goals. Please review the following plan we discussed and let me know if I can assist you in the future.   These are the goals we discussed:  Goals      DIET - EAT MORE FRUITS AND VEGETABLES     Increase physical activity     Recommend regular physical activity at least 3 days per week        This is a list of the screening recommended for you and due dates:  Health Maintenance  Topic Date Due   Zoster (Shingles) Vaccine (1 of 2) Never done   DTaP/Tdap/Td vaccine (2 - Td or Tdap) 04/08/2021   Complete foot exam   03/20/2022   Eye exam for diabetics  07/21/2022   Hemoglobin A1C  07/24/2022   Yearly kidney function blood test for diabetes  01/23/2023   Yearly kidney health urinalysis for diabetes  01/23/2023   Medicare Annual Wellness Visit  05/17/2023   Pneumonia Vaccine  Completed   Flu Shot  Completed   COVID-19 Vaccine  Completed   Hepatitis C Screening: USPSTF Recommendation to screen - Ages 18-79 yo.  Completed   HPV Vaccine  Aged Out   Colon Cancer Screening  Discontinued    Advanced directives: yes  Conditions/risks identified: none  Next appointment: Follow up in one year for your annual wellness visit. 05/22/23 @ 9:30 am in person  Preventive Care 77 Years and Older, Male  Preventive care refers to lifestyle choices and visits with your health care provider that can promote health and wellness. What does preventive care include? A yearly physical exam. This is also called an annual well check. Dental exams once or twice a year. Routine eye exams. Ask your health care provider how often you should have your eyes checked. Personal lifestyle choices, including: Daily care of your teeth and gums. Regular physical activity. Eating a healthy diet. Avoiding tobacco and drug use. Limiting alcohol use. Practicing safe sex. Taking low  doses of aspirin every day. Taking vitamin and mineral supplements as recommended by your health care provider. What happens during an annual well check? The services and screenings done by your health care provider during your annual well check will depend on your age, overall health, lifestyle risk factors, and family history of disease. Counseling  Your health care provider may ask you questions about your: Alcohol use. Tobacco use. Drug use. Emotional well-being. Home and relationship well-being. Sexual activity. Eating habits. History of falls. Memory and ability to understand (cognition). Work and work Statistician. Screening  You may have the following tests or measurements: Height, weight, and BMI. Blood pressure. Lipid and cholesterol levels. These may be checked every 5 years, or more frequently if you are over 42 years old. Skin check. Lung cancer screening. You may have this screening every year starting at age 67 if you have a 30-pack-year history of smoking and currently smoke or have quit within the past 15 years. Fecal occult blood test (FOBT) of the stool. You may have this test every year starting at age 59. Flexible sigmoidoscopy or colonoscopy. You may have a sigmoidoscopy every 5 years or a colonoscopy every 10 years starting at age 46. Prostate cancer screening. Recommendations will vary depending on your family history and other risks. Hepatitis C blood test. Hepatitis B blood test. Sexually transmitted disease (STD) testing. Diabetes screening. This is done  by checking your blood sugar (glucose) after you have not eaten for a while (fasting). You may have this done every 1-3 years. Abdominal aortic aneurysm (AAA) screening. You may need this if you are a current or former smoker. Osteoporosis. You may be screened starting at age 2 if you are at high risk. Talk with your health care provider about your test results, treatment options, and if necessary, the need  for more tests. Vaccines  Your health care provider may recommend certain vaccines, such as: Influenza vaccine. This is recommended every year. Tetanus, diphtheria, and acellular pertussis (Tdap, Td) vaccine. You may need a Td booster every 10 years. Zoster vaccine. You may need this after age 84. Pneumococcal 13-valent conjugate (PCV13) vaccine. One dose is recommended after age 73. Pneumococcal polysaccharide (PPSV23) vaccine. One dose is recommended after age 54. Talk to your health care provider about which screenings and vaccines you need and how often you need them. This information is not intended to replace advice given to you by your health care provider. Make sure you discuss any questions you have with your health care provider. Document Released: 04/21/2015 Document Revised: 12/13/2015 Document Reviewed: 01/24/2015 Elsevier Interactive Patient Education  2017 [place] Prevention in the Home Falls can cause injuries. They can happen to people of all ages. There are many things you can do to make your home safe and to help prevent falls. What can I do on the outside of my home? Regularly fix the edges of walkways and driveways and fix any cracks. Remove anything that might make you trip as you walk through a door, such as a raised step or threshold. Trim any bushes or trees on the path to your home. Use bright outdoor lighting. Clear any walking paths of anything that might make someone trip, such as rocks or tools. Regularly check to see if handrails are loose or broken. Make sure that both sides of any steps have handrails. Any raised decks and porches should have guardrails on the edges. Have any leaves, snow, or ice cleared regularly. Use sand or salt on walking paths during winter. Clean up any spills in your garage right away. This includes oil or grease spills. What can I do in the bathroom? Use night lights. Install grab bars by the toilet and in the tub and  shower. Do not use towel bars as grab bars. Use non-skid mats or decals in the tub or shower. If you need to sit down in the shower, use a plastic, non-slip stool. Keep the floor dry. Clean up any water that spills on the floor as soon as it happens. Remove soap buildup in the tub or shower regularly. Attach bath mats securely with double-sided non-slip rug tape. Do not have throw rugs and other things on the floor that can make you trip. What can I do in the bedroom? Use night lights. Make sure that you have a light by your bed that is easy to reach. Do not use any sheets or blankets that are too big for your bed. They should not hang down onto the floor. Have a firm chair that has side arms. You can use this for support while you get dressed. Do not have throw rugs and other things on the floor that can make you trip. What can I do in the kitchen? Clean up any spills right away. Avoid walking on wet floors. Keep items that you use a lot in easy-to-reach places. If you need to  reach something above you, use a strong step stool that has a grab bar. Keep electrical cords out of the way. Do not use floor polish or wax that makes floors slippery. If you must use wax, use non-skid floor wax. Do not have throw rugs and other things on the floor that can make you trip. What can I do with my stairs? Do not leave any items on the stairs. Make sure that there are handrails on both sides of the stairs and use them. Fix handrails that are broken or loose. Make sure that handrails are as long as the stairways. Check any carpeting to make sure that it is firmly attached to the stairs. Fix any carpet that is loose or worn. Avoid having throw rugs at the top or bottom of the stairs. If you do have throw rugs, attach them to the floor with carpet tape. Make sure that you have a light switch at the top of the stairs and the bottom of the stairs. If you do not have them, ask someone to add them for  you. What else can I do to help prevent falls? Wear shoes that: Do not have high heels. Have rubber bottoms. Are comfortable and fit you well. Are closed at the toe. Do not wear sandals. If you use a stepladder: Make sure that it is fully opened. Do not climb a closed stepladder. Make sure that both sides of the stepladder are locked into place. Ask someone to hold it for you, if possible. Clearly mark and make sure that you can see: Any grab bars or handrails. First and last steps. Where the edge of each step is. Use tools that help you move around (mobility aids) if they are needed. These include: Canes. Walkers. Scooters. Crutches. Turn on the lights when you go into a dark area. Replace any light bulbs as soon as they burn out. Set up your furniture so you have a clear path. Avoid moving your furniture around. If any of your floors are uneven, fix them. If there are any pets around you, be aware of where they are. Review your medicines with your doctor. Some medicines can make you feel dizzy. This can increase your chance of falling. Ask your doctor what other things that you can do to help prevent falls. This information is not intended to replace advice given to you by your health care provider. Make sure you discuss any questions you have with your health care provider. Document Released: 01/19/2009 Document Revised: 08/31/2015 Document Reviewed: 04/29/2014 Elsevier Interactive Patient Education  2017 [employer].

## 2022-07-16 ENCOUNTER — Other Ambulatory Visit: Payer: Self-pay

## 2022-07-16 DIAGNOSIS — E1159 Type 2 diabetes mellitus with other circulatory complications: Secondary | ICD-10-CM

## 2022-07-16 MED ORDER — FREESTYLE LITE TEST VI STRP: ORAL_STRIP | 0 refills | Status: DC

## 2022-07-24 ENCOUNTER — Encounter: Payer: Self-pay | Admitting: Family Medicine

## 2022-07-24 ENCOUNTER — Ambulatory Visit (INDEPENDENT_AMBULATORY_CARE_PROVIDER_SITE_OTHER): Payer: Medicare Other | Admitting: Family Medicine

## 2022-07-24 VITALS — BP 112/62 | HR 80 | Ht 70.0 in | Wt 191.0 lb

## 2022-07-24 DIAGNOSIS — E785 Hyperlipidemia, unspecified: Secondary | ICD-10-CM

## 2022-07-24 DIAGNOSIS — I10 Essential (primary) hypertension: Secondary | ICD-10-CM

## 2022-07-24 DIAGNOSIS — E1169 Type 2 diabetes mellitus with other specified complication: Secondary | ICD-10-CM | POA: Diagnosis not present

## 2022-07-24 MED ORDER — ATORVASTATIN CALCIUM 40 MG PO TABS: 40.0000 mg | ORAL_TABLET | Freq: Every day | ORAL | 1 refills | Status: DC

## 2022-07-24 MED ORDER — LOSARTAN POTASSIUM 50 MG PO TABS: 50.0000 mg | ORAL_TABLET | Freq: Every day | ORAL | 1 refills | Status: DC

## 2022-07-24 NOTE — Progress Notes (Signed)
Date:  07/24/2022   Name:  [name]   DOB:  1945-06-19   MRN:  [id number]   Chief Complaint: Hyperlipidemia and Hypertension  Hyperlipidemia This is a chronic problem. The current episode started more than 1 year ago. The problem is controlled. Recent lipid tests were reviewed and are normal. He has no history of chronic renal disease. Associated symptoms include shortness of breath. Pertinent negatives include no chest pain or myalgias. Current antihyperlipidemic treatment includes statins. The current treatment provides moderate improvement of lipids. There are no compliance problems.  Risk factors for coronary artery disease include hypertension.  Hypertension This is a chronic problem. The current episode started more than 1 year ago. The problem has been gradually improving since onset. The problem is controlled. Associated symptoms include shortness of breath. Pertinent negatives include no anxiety, blurred vision, chest pain, headaches, malaise/fatigue, neck pain, orthopnea, palpitations, peripheral edema, PND or sweats. There are no associated agents to hypertension. There are no known risk factors for coronary artery disease. Past treatments include ACE inhibitors and diuretics. The current treatment provides moderate improvement. There are no compliance problems.  There is no history of angina, kidney disease, CAD/MI, CVA, heart failure, left ventricular hypertrophy, PVD or retinopathy. There is no history of chronic renal disease, a hypertension causing med or renovascular disease.    Lab Results  Component Value Date   NA 139 01/22/2022   K 4.9 01/22/2022   CO2 21 01/22/2022   GLUCOSE 164 (H) 01/22/2022   BUN 27 (A) 03/28/2022   CREATININE 1.4 (A) 03/28/2022   CALCIUM 9.9 01/22/2022   EGFR 40 (L) 01/22/2022   GFRNONAA 40 (L) 07/19/2020   Lab Results  Component Value Date   CHOL 94 04/26/2022   HDL 37 04/26/2022   LDLCALC 40 04/26/2022   TRIG 84 04/26/2022    CHOLHDL 4.6 11/05/2017   Lab Results  Component Value Date   TSH 1.00 02/15/2022   TSH 3.48 02/15/2022   Lab Results  Component Value Date   HGBA1C 7.3 04/26/2022   Lab Results  Component Value Date   WBC 6.3 07/19/2020   HGB 9.4 (L) 07/19/2020   HCT 29.9 (L) 07/19/2020   MCV 62.7 (L) 07/19/2020   PLT 104 (L) 07/19/2020   Lab Results  Component Value Date   ALT 22 01/22/2022   AST 20 01/22/2022   ALKPHOS 84 01/22/2022   BILITOT 1.1 01/22/2022   No results found for: "25OHVITD2", "25OHVITD3", "VD25OH"   Review of Systems  Constitutional:  Negative for chills, fever and malaise/fatigue.  HENT:  Negative for drooling, ear discharge, ear pain and sore throat.   Eyes:  Negative for blurred vision.  Respiratory:  Positive for shortness of breath. Negative for cough and wheezing.   Cardiovascular:  Negative for chest pain, palpitations, orthopnea, leg swelling and PND.  Gastrointestinal:  Negative for abdominal pain, blood in stool, constipation, diarrhea and nausea.  Endocrine: Negative for polydipsia.  Genitourinary:  Negative for dysuria, frequency, hematuria and urgency.  Musculoskeletal:  Negative for back pain, myalgias and neck pain.  Skin:  Negative for rash.  Allergic/Immunologic: Negative for environmental allergies.  Neurological:  Negative for dizziness and headaches.  Hematological:  Does not bruise/bleed easily.  Psychiatric/Behavioral:  Negative for suicidal ideas. The patient is not nervous/anxious.     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   Presence of aortocoronary bypass graft 04/21/2015   Thrombocytopenia 04/20/2015   Coronary artery disease 04/18/2015   Unstable angina 04/17/2015   Elevated troponin 04/17/2015   DM type 2 causing CKD stage 3 04/17/2015   Essential hypertension 04/17/2015    No Known Allergies  Past Surgical History:  Procedure Laterality Date   CARDIAC CATHETERIZATION N/A 04/18/2015   Procedure: Left Heart Cath and Coronary Angiography;  Surgeon: Alwyn Pea, MD;  Location: [facility] INVASIVE CV LAB;  Service: Cardiovascular;  Laterality: N/A;   COLONOSCOPY  2014   cleared for 10 yrs   CORONARY ARTERY BYPASS GRAFT N/A 04/21/2015   Procedure: CORONARY ARTERY BYPASS GRAFTING (CABG) X 4 UTILIZING THE LEFT INTERNAL MAMMARY ARTERY TO LAD, ENDOSCOPICALLY HARVESTED RIGHT SAPHENEOUS VEINS TO PD,DIAGONAL AND CIRCUMFLEX.;  Surgeon: Delight Ovens, MD;  Location: [facility] OR;  Service: Open Heart Surgery;  Laterality: N/A;   DUPUYTREN CONTRACTURE RELEASE Left ~ 2000   EYE SURGERY Bilateral    cataract removed   INCISION AND DRAINAGE Left 07/18/2020   Procedure: INCISION AND DRAINAGE LEFT KNEE;  Surgeon: Ross Marcus, MD;  Location: [facility] ORS;  Service: Orthopedics;  Laterality: Left;   KNEE ARTHROSCOPY Right ~ 1995   TEE WITHOUT CARDIOVERSION N/A 04/21/2015   Procedure: TRANSESOPHAGEAL ECHOCARDIOGRAM (TEE);  Surgeon: Delight Ovens, MD;  Location: [facility];  Service: Open Heart Surgery;  Laterality: N/A;    Social History   Tobacco Use   Smoking status: Never   Smokeless tobacco: Never  Vaping Use   Vaping Use: Never used  Substance Use Topics   Alcohol use: Not Currently    Alcohol/week: 0.0 standard drinks of alcohol   Drug use: No     Medication list has been reviewed and updated.  Current Meds  Medication Sig   acetaminophen (TYLENOL) 500 MG tablet Take 2 tablets  (1,000 mg total) by mouth every 8 (eight) hours.   aspirin 81 MG tablet Take 1 tablet (81 mg total) by mouth daily.   atorvastatin (LIPITOR) 40 MG tablet Take 1 tablet (40 mg total) by mouth daily.   dapagliflozin propanediol (FARXIGA) 10 MG TABS tablet Take 10 mg by mouth daily. lateef   glipiZIDE (GLUCOTROL) 10 MG tablet TAKE 1 TABLET (10 MG TOTAL) BY MOUTH TWICE A DAY BEFORE A MEAL   glucose blood (FREESTYLE LITE) test strip Test once daily   isosorbide mononitrate (IMDUR) 60 MG 24 hr tablet Take 60 mg by mouth daily. cardio   losartan (COZAAR) 50 MG tablet Take 1 tablet (50 mg total) by mouth daily.   metFORMIN (GLUCOPHAGE) 500 MG tablet Take 1 tablet (500 mg total) by mouth 2 (two) times daily with a meal.   Study - EVOLVE-MI - evolocumab (REPATHA) 140 mg/mL SQ injection (PI-Stuckey) Inject 140 mg into the skin every 14 (fourteen) days.       07/24/2022   10:37 AM 01/22/2022   10:19 AM 07/20/2021    9:11 AM 08/24/2020    8:14 AM  GAD 7 : Generalized Anxiety Score  Nervous,  Anxious, on Edge 0 0 0 0  Control/stop worrying 0 0 0 0  Worry too much - different things 0 0 0 0  Trouble relaxing 0 0 0 0  Restless 0 0 0 0  Easily annoyed or irritable 0 0 0 0  Afraid - awful might happen 0 0 0 0  Total GAD 7 Score 0 0 0 0  Anxiety Difficulty Not difficult at all Not difficult at all Not difficult at all Not difficult at all       07/24/2022   10:37 AM 05/16/2022   10:00 AM 01/22/2022   10:19 AM  Depression screen PHQ 2/9  Decreased Interest 0 0 0  Down, Depressed, Hopeless 0 0 0  PHQ - 2 Score 0 0 0  Altered sleeping 0 0 0  Tired, decreased energy 0 0 0  Change in appetite 0 0 0  Feeling bad or failure about yourself  0 0 0  Trouble concentrating 0 0 0  Moving slowly or fidgety/restless 0 0 0  Suicidal thoughts 0 0 0  PHQ-9 Score 0 0 0  Difficult doing work/chores Not difficult at all Not difficult at all Not difficult at all    BP Readings from Last 3 Encounters:  07/24/22  112/62  05/16/22 114/60  01/22/22 (!) 80/48    Physical Exam Vitals and nursing note reviewed.  HENT:     Head: Normocephalic.     Right Ear: Tympanic membrane and external ear normal.     Left Ear: Tympanic membrane and external ear normal.     Nose: Nose normal.     Mouth/Throat:     Mouth: Mucous membranes are moist.  Eyes:     General: No scleral icterus.       Right eye: No discharge.        Left eye: No discharge.     Conjunctiva/sclera: Conjunctivae normal.     Pupils: Pupils are equal, round, and reactive to light.  Neck:     Thyroid: No thyromegaly.     Vascular: No JVD.     Trachea: No tracheal deviation.  Cardiovascular:     Rate and Rhythm: Normal rate and regular rhythm.     Heart sounds: Normal heart sounds. No murmur heard.    No friction rub. No gallop.  Pulmonary:     Effort: No respiratory distress.     Breath sounds: Normal breath sounds. No wheezing or rales.  Abdominal:     General: Bowel sounds are normal.     Palpations: Abdomen is soft. There is no mass.     Tenderness: There is no abdominal tenderness. There is no guarding or rebound.  Musculoskeletal:        General: No tenderness. Normal range of motion.     Cervical back: Normal range of motion and neck supple.  Lymphadenopathy:     Cervical: No cervical adenopathy.  Skin:    General: Skin is warm.     Findings: No rash.  Neurological:     Mental Status: He is alert.     Deep Tendon Reflexes: Reflexes are normal and symmetric.     Reflex Scores:      Patellar reflexes are 2+ on the right side and 2+ on the left side.    Wt Readings from Last 3 Encounters:  07/24/22 191 lb (86.6 kg)  05/16/22 197 lb 6.4 oz (89.5 kg)  01/22/22 195 lb (88.5 kg)    BP 112/62   Pulse 80  Ht  (1.778 m)   Wt 191 lb (86.6 kg)   SpO2 95%   BMI 27.41 kg/m   Assessment and Plan:  1. Hyperlipidemia associated with type 2 diabetes mellitus Chronic.  Controlled.  Stable.  Review of previous  lipid panel is in excellent range and patient is undergoing Repatha treatment.  We will also continue atorvastatin 40 mg once a day. - atorvastatin (LIPITOR) 40 MG tablet; Take 1 tablet (40 mg total) by mouth daily.  Dispense: 90 tablet; Refill: 1  2. Essential hypertension Chronic.  Controlled.  Stable.  Blood pressure 112/62.  Asymptomatic.  Tolerating medication well.  Continue losartan 50 mg once a day.  Patient does have upcoming cardiology appointment at which time we will be discussing his shortness of breath with significant activity.  (Patient notes no no shortness of breath taking the stairs to the clinic) - losartan (COZAAR) 50 MG tablet; Take 1 tablet (50 mg total) by mouth daily.  Dispense: 90 tablet; Refill: 1    Elizabeth Sauer, MD

## 2022-07-30 ENCOUNTER — Other Ambulatory Visit: Payer: Self-pay | Admitting: Family Medicine

## 2022-07-30 DIAGNOSIS — E1159 Type 2 diabetes mellitus with other circulatory complications: Secondary | ICD-10-CM

## 2022-07-30 DIAGNOSIS — E113292 Type 2 diabetes mellitus with mild nonproliferative diabetic retinopathy without macular edema, left eye: Secondary | ICD-10-CM | POA: Diagnosis not present

## 2022-07-30 DIAGNOSIS — Z961 Presence of intraocular lens: Secondary | ICD-10-CM | POA: Diagnosis not present

## 2022-07-30 LAB — HM DIABETES EYE EXAM

## 2022-07-30 NOTE — Telephone Encounter (Signed)
Requested medications are due for refill today.  A little too soon  Requested medications are on the active medications list.  yes  Last refill. 01/22/2022 #180 1 rf  Future visit scheduled.   yes  Notes to clinic.  Abnormal - missing labs.    Requested Prescriptions  Pending Prescriptions Disp Refills   metFORMIN (GLUCOPHAGE) 500 MG tablet [Pharmacy Med Name: METFORMIN HCL 500 MG TABLET] 180 tablet 1    Sig: TAKE 1 TABLET BY MOUTH 2 TIMES DAILY WITH A MEAL.     Endocrinology:  Diabetes - Biguanides Failed - 07/30/2022  1:50 AM      Failed - Cr in normal range and within 360 days    Creatinine  Date Value Ref Range Status  03/28/2022 1.4 (A) 0.6 - 1.3 Final  07/16/2011 1.00 0.60 - 1.30 mg/dL Final   Creatinine, Ser  Date Value Ref Range Status  01/22/2022 1.76 (H) 0.76 - 1.27 mg/dL Final         Failed - eGFR in normal range and within 360 days    EGFR (African American)  Date Value Ref Range Status  07/16/2011 >60 >8mL/min Final   GFR calc Af Amer  Date Value Ref Range Status  05/09/2020 48 (L) >59 mL/min/1.73 Final    Comment:    **In accordance with recommendations from the NKF-ASN Task force,**   [facility] is in the process of updating its eGFR calculation to the   2021 CKD-EPI creatinine equation that estimates kidney function   without a race variable.    EGFR (Non-African Amer.)  Date Value Ref Range Status  07/16/2011 >60 >82mL/min Final    Comment:    eGFR values <16mL/min/1.73 m2 may be an indication of chronic kidney disease (CKD). Calculated eGFR, using the MRDR Study equation, is useful in  patients with stable renal function. The eGFR calculation will not be reliable in acutely ill patients when serum creatinine is changing rapidly. It is not useful in patients on dialysis. The eGFR calculation may not be applicable to patients at the low and high extremes of body sizes, pregnant women, and vegetarians.    GFR, Estimated  Date Value Ref Range  Status  07/19/2020 40 (L) >60 mL/min Final    Comment:    (NOTE) Calculated using the CKD-EPI Creatinine Equation (2021)    eGFR  Date Value Ref Range Status  01/22/2022 40 (L) >59 mL/min/1.73 Final         Failed - B12 Level in normal range and within 720 days    Vitamin B-12  Date Value Ref Range Status  03/06/2016 383 180 - 914 pg/mL Final    Comment:    (NOTE) This assay is not validated for testing neonatal or myeloproliferative syndrome specimens for Vitamin B12 levels. Performed at [facility]          Failed - CBC within normal limits and completed in the last 12 months    WBC  Date Value Ref Range Status  07/19/2020 6.3 4.0 - 10.5 K/uL Final   RBC  Date Value Ref Range Status  07/19/2020 4.77 4.22 - 5.81 MIL/uL Final   Hemoglobin  Date Value Ref Range Status  07/19/2020 9.4 (L) 13.0 - 17.0 g/dL Final   HGB  Date Value Ref Range Status  07/16/2011 12.0 (L) 13.0 - 18.0 g/dL Final   HCT  Date Value Ref Range Status  07/19/2020 29.9 (L) 39.0 - 52.0 % Final  07/16/2011 40.0 40.0 - 52.0 %  Final   MCHC  Date Value Ref Range Status  07/19/2020 31.4 30.0 - 36.0 g/dL Final   [facility]  Date Value Ref Range Status  07/19/2020 19.7 (L) 26.0 - 34.0 pg Final   MCV  Date Value Ref Range Status  07/19/2020 62.7 (L) 80.0 - 100.0 fL Final  07/16/2011 66 (L) 80 - 100 fL Final   No results found for: "PLTCOUNTKUC", "LABPLAT", "POCPLA" RDW  Date Value Ref Range Status  07/19/2020 16.8 (H) 11.5 - 15.5 % Final  07/16/2011 16.1 (H) 11.5 - 14.5 % Final         Passed - HBA1C is between 0 and 7.9 and within 180 days    Hemoglobin A1C  Date Value Ref Range Status  04/26/2022 7.3  Final         Passed - Valid encounter within last 6 months    Recent Outpatient Visits           6 days ago Essential hypertension   [place] [facility], MD   6 months ago Type 2 diabetes mellitus with other circulatory  complication, without long-term current use of insulin (HCC)   [place] [facility], MD   1 year ago Type 2 diabetes mellitus with other circulatory complication, without long-term current use of insulin (HCC)   [place] [facility] at [facility], MD   1 year ago Type 2 diabetes mellitus with other circulatory complication, without long-term current use of insulin (HCC)   [place] [facility], MD   1 year ago Type 2 diabetes mellitus with other circulatory complication, without long-term current use of insulin (HCC)   [place] [facility], MD       Future Appointments             In 5 months Duanne Limerick, MD [facility]

## 2022-08-08 DIAGNOSIS — I10 Essential (primary) hypertension: Secondary | ICD-10-CM | POA: Diagnosis not present

## 2022-08-08 DIAGNOSIS — E1122 Type 2 diabetes mellitus with diabetic chronic kidney disease: Secondary | ICD-10-CM | POA: Diagnosis not present

## 2022-08-08 DIAGNOSIS — R809 Proteinuria, unspecified: Secondary | ICD-10-CM | POA: Diagnosis not present

## 2022-08-08 DIAGNOSIS — N1831 Chronic kidney disease, stage 3a: Secondary | ICD-10-CM | POA: Diagnosis not present

## 2022-08-08 DIAGNOSIS — D631 Anemia in chronic kidney disease: Secondary | ICD-10-CM | POA: Diagnosis not present

## 2022-08-20 DIAGNOSIS — I25118 Atherosclerotic heart disease of native coronary artery with other forms of angina pectoris: Secondary | ICD-10-CM | POA: Diagnosis not present

## 2022-08-20 DIAGNOSIS — I2510 Atherosclerotic heart disease of native coronary artery without angina pectoris: Secondary | ICD-10-CM | POA: Diagnosis not present

## 2022-08-20 DIAGNOSIS — I447 Left bundle-branch block, unspecified: Secondary | ICD-10-CM | POA: Diagnosis not present

## 2022-08-20 DIAGNOSIS — N1831 Chronic kidney disease, stage 3a: Secondary | ICD-10-CM | POA: Diagnosis not present

## 2022-08-20 DIAGNOSIS — I25810 Atherosclerosis of coronary artery bypass graft(s) without angina pectoris: Secondary | ICD-10-CM | POA: Diagnosis not present

## 2022-08-20 DIAGNOSIS — I10 Essential (primary) hypertension: Secondary | ICD-10-CM | POA: Diagnosis not present

## 2022-08-20 DIAGNOSIS — E1159 Type 2 diabetes mellitus with other circulatory complications: Secondary | ICD-10-CM | POA: Diagnosis not present

## 2022-08-20 DIAGNOSIS — R0602 Shortness of breath: Secondary | ICD-10-CM | POA: Diagnosis not present

## 2022-09-10 ENCOUNTER — Other Ambulatory Visit: Payer: Self-pay | Admitting: Family Medicine

## 2022-09-10 DIAGNOSIS — E1159 Type 2 diabetes mellitus with other circulatory complications: Secondary | ICD-10-CM

## 2022-10-08 ENCOUNTER — Other Ambulatory Visit: Payer: Self-pay | Admitting: Family Medicine

## 2022-10-12 ENCOUNTER — Other Ambulatory Visit: Payer: Self-pay | Admitting: Family Medicine

## 2022-10-12 DIAGNOSIS — E1159 Type 2 diabetes mellitus with other circulatory complications: Secondary | ICD-10-CM

## 2022-10-15 DIAGNOSIS — M720 Palmar fascial fibromatosis [Dupuytren]: Secondary | ICD-10-CM | POA: Diagnosis not present

## 2022-10-24 ENCOUNTER — Other Ambulatory Visit: Payer: Self-pay | Admitting: Surgery

## 2022-10-25 DIAGNOSIS — R202 Paresthesia of skin: Secondary | ICD-10-CM | POA: Diagnosis not present

## 2022-10-25 DIAGNOSIS — E1121 Type 2 diabetes mellitus with diabetic nephropathy: Secondary | ICD-10-CM | POA: Diagnosis not present

## 2022-10-25 DIAGNOSIS — E7800 Pure hypercholesterolemia, unspecified: Secondary | ICD-10-CM | POA: Diagnosis not present

## 2022-10-25 DIAGNOSIS — I2510 Atherosclerotic heart disease of native coronary artery without angina pectoris: Secondary | ICD-10-CM | POA: Diagnosis not present

## 2022-10-30 ENCOUNTER — Encounter
Admission: RE | Admit: 2022-10-30 | Discharge: 2022-10-30 | Disposition: A | Discharge status: Home / Self Care | Payer: Medicare Other | Source: Ambulatory Visit | Attending: Surgery | Admitting: Surgery

## 2022-10-30 ENCOUNTER — Encounter: Payer: Self-pay | Admitting: Surgery

## 2022-10-30 DIAGNOSIS — I447 Left bundle-branch block, unspecified: Secondary | ICD-10-CM | POA: Diagnosis not present

## 2022-10-30 DIAGNOSIS — Z01818 Encounter for other preprocedural examination: Secondary | ICD-10-CM

## 2022-10-30 DIAGNOSIS — Z01810 Encounter for preprocedural cardiovascular examination: Secondary | ICD-10-CM | POA: Diagnosis not present

## 2022-10-30 DIAGNOSIS — N1832 Chronic kidney disease, stage 3b: Secondary | ICD-10-CM

## 2022-10-30 DIAGNOSIS — M720 Palmar fascial fibromatosis [Dupuytren]: Secondary | ICD-10-CM | POA: Diagnosis not present

## 2022-10-30 HISTORY — DX: Left bundle-branch block, unspecified: I44.7

## 2022-10-30 HISTORY — DX: Anemia, unspecified: D64.9

## 2022-10-30 HISTORY — DX: Laceration without foreign body, left knee, initial encounter: S81.012A

## 2022-10-30 HISTORY — DX: Chronic kidney disease, stage 3a: N18.31

## 2022-10-30 HISTORY — DX: Unstable angina: I20.0

## 2022-10-30 HISTORY — DX: Other specified cardiac arrhythmias: I49.8

## 2022-10-30 HISTORY — DX: Thrombocytopenia, unspecified: D69.6

## 2022-10-30 HISTORY — DX: Dyspnea, unspecified: R06.00

## 2022-10-30 HISTORY — DX: Thalassemia minor: D56.3

## 2022-10-30 NOTE — Patient Instructions (Addendum)
Your procedure is scheduled on:Wednesday, July 31 Report to the Registration Desk on the 1st floor of the [facility] To find out your arrival time, please call [phone] between 1PM - 3PM on: Tuesday, July 30 If your arrival time is 6:00 am, do not arrive before that time as the [facility] entrance doors do not open until 6:00 am.  REMEMBER: Instructions that are not followed completely may result in serious medical risk, up to and including death; or upon the discretion of your surgeon and anesthesiologist your surgery may need to be rescheduled.  Do not eat food after midnight the night before surgery.  No gum chewing or hard candies.  You may however, drink WATER up to 2 hours before you are scheduled to arrive for your surgery. Do not drink anything within 2 hours of your scheduled arrival time.  In addition, your doctor has ordered for you to drink the provided:  Gatorade G2 Drinking this carbohydrate drink up to two hours before surgery helps to reduce insulin resistance and improve patient outcomes. Please complete drinking 2 hours before scheduled arrival time.  One week prior to surgery: Stop Anti-inflammatories (NSAIDS) such as Advil, Aleve, Ibuprofen, Motrin, Naproxen, Naprosyn and Aspirin based products such as Excedrin, Goody's Powder, BC Powder.  Per Dr. Joice Lofts continue taking aspirin 81 mg  Stop ANY OVER THE COUNTER supplements until after surgery. You may however, continue to take Tylenol if needed for pain up until the day of surgery.  Continue taking all prescribed medications with the exception of the following: dapagliflozin propanediol (FARXIGA) hold 3 days before surgery. Last Dose Saturday July 27  metFORMIN (GLUCOPHAGE) hold 2 days. Last dose Sunday, July 28     TAKE ONLY THESE MEDICATIONS THE MORNING OF SURGERY WITH A SIP OF WATER:  isosorbide mononitrate (IMDUR)   No Alcohol for 24 hours before or after surgery.  No Smoking including  e-cigarettes for 24 hours before surgery.  No chewable tobacco products for at least 6 hours before surgery.  No nicotine patches on the day of surgery.  Do not use any "recreational" drugs for at least a week (preferably 2 weeks) before your surgery.  Please be advised that the combination of cocaine and anesthesia may have negative outcomes, up to and including death. If you test positive for cocaine, your surgery will be cancelled.  On the morning of surgery brush your teeth with toothpaste and water, you may rinse your mouth with mouthwash if you wish. Do not swallow any toothpaste or mouthwash.  Use CHG Soap as directed on instruction sheet.  Do not wear jewelry, make-up, hairpins, clips or nail polish.  Do not wear lotions, powders, or perfumes.   Do not shave body hair from the neck down 48 hours before surgery.  Contact lenses, hearing aids and dentures may not be worn into surgery.  Do not bring valuables to the hospital. [facility] is not responsible for any missing/lost belongings or valuables.   Notify your doctor if there is any change in your medical condition (cold, fever, infection).  Wear comfortable clothing (specific to your surgery type) to the hospital.  After surgery, you can help prevent lung complications by doing breathing exercises.  Take deep breaths and cough every 1-2 hours. Your doctor may order a device called an Incentive Spirometer to help you take deep breaths. When coughing or sneezing, hold a pillow firmly against your incision with both hands. This is called "splinting." Doing this helps protect your incision.  It also decreases belly discomfort.  If you are being discharged the day of surgery, you will not be allowed to drive home. You will need a responsible individual to drive you home and stay with you for 24 hours after surgery.   If you are taking public transportation, you will need to have a responsible individual with you.  Please call  the Pre-admissions Testing Dept. at [phone] if you have any questions about these instructions.  Surgery Visitation Policy:  Patients having surgery or a procedure may have two visitors.  Children under the age of 70 must have an adult with them who is not the patient.       Preparing for Surgery with CHLORHEXIDINE GLUCONATE (CHG) Soap  Chlorhexidine Gluconate (CHG) Soap  o An antiseptic cleaner that kills germs and bonds with the skin to continue killing germs even after washing  o Used for showering the night before surgery and morning of surgery  Before surgery, you can play an important role by reducing the number of germs on your skin.  CHG (Chlorhexidine gluconate) soap is an antiseptic cleanser which kills germs and bonds with the skin to continue killing germs even after washing.  Please do not use if you have an allergy to CHG or antibacterial soaps. If your skin becomes reddened/irritated stop using the CHG.  1. Shower the NIGHT BEFORE SURGERY and the MORNING OF SURGERY with CHG soap.  2. If you choose to wash your hair, wash your hair first as usual with your normal shampoo.  3. After shampooing, rinse your hair and body thoroughly to remove the shampoo.  4. Use CHG as you would any other liquid soap. You can apply CHG directly to the skin and wash gently with a scrungie or a clean washcloth.  5. Apply the CHG soap to your body only from the neck down. Do not use on open wounds or open sores. Avoid contact with your eyes, ears, mouth, and genitals (private parts). Wash face and genitals (private parts) with your normal soap.  6. Wash thoroughly, paying special attention to the area where your surgery will be performed.  7. Thoroughly rinse your body with warm water.  8. Do not shower/wash with your normal soap after using and rinsing off the CHG soap.  9. Pat yourself dry with a clean towel.  10. Wear clean pajamas to bed the night before surgery.  12.  Place clean sheets on your bed the night of your first shower and do not sleep with pets.  13. Shower again with the CHG soap on the day of surgery prior to arriving at the hospital.  14. Do not apply any deodorants/lotions/powders.  15. Please wear clean clothes to the hospital.

## 2022-10-30 NOTE — Progress Notes (Signed)
Perioperative / Anesthesia Services  Pre-Admission Testing Clinical Review / Preoperative Anesthesia Consult  Date: 11/01/22  Patient Demographics:  Name: [name] DOB:   18-Jan-1946 MRN:   [id number]  Planned Surgical Procedure(s):    Case: [id number] Date/Time: 11/06/22 1116   Procedure: EXCISION OF DUPUYTREN'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 RING FINGERS (Right: Hand)   Anesthesia type: Choice   Pre-op diagnosis: Dupuytren's contracture M72.0   Location: [facility] OR ROOM 02 / [facility] ORS FOR ANESTHESIA GROUP   Surgeons: Christena Flake, MD     NOTE: Available PAT nursing documentation and vital signs have been reviewed. Clinical nursing staff has updated patient's PMH/PSHx, current medication list, and drug allergies/intolerances to ensure comprehensive history available to assist in medical decision making as it pertains to the aforementioned surgical procedure and anticipated anesthetic course. Extensive review of available clinical information personally performed. [name] PM[facility] and PSHx updated with any diagnoses/procedures that  may have been inadvertently omitted during his intake with the pre-admission testing department's nursing staff.  Clinical Discussion:  [name] is a 77 y.o. male who is submitted for pre-surgical anesthesia review and clearance prior to him undergoing the above procedure. Patient has never been a smoker. Pertinent PMH includes: CAD (s/p CABG), NSTEMI, ischemic cardiomyopathy, ascending aorta dilatation, enlarged pulmonary artery, postoperative atrial fibrillation, bigeminy, LBBB, HTN, HLD, T2DM, dyspnea, CKD-III, anemia, thrombocytopenia, beta thalassemia minor, Dupuytren's contracture of both hands (s/p LEFT release).  Patient is followed by cardiology Robert Pares, MD). He was last seen in the cardiology clinic on 08/20/2022; notes reviewed. At the time of his clinic visit, patient doing well overall from a cardiovascular perspective. Patient  denied any chest pain, shortness of breath, PND, orthopnea, palpitations, significant peripheral edema, weakness, fatigue, vertiginous symptoms, or presyncope/syncope. Patient with a past medical history significant for cardiovascular diagnoses. Documented physical exam was grossly benign, providing no evidence of acute exacerbation and/or decompensation of the patient's known cardiovascular conditions.  Patient suffered an NSTEMI on 04/16/2015.  Troponins were trended: 0.06 --> 0.35 --> 0.89 ng/mL.  TTE performed on 04/17/2015 revealed normal left ventricular systolic function with an EF of 60-65%.  There were no regional wall motion abnormalities.  There was mild LVH.  Right ventricular size and function normal.  Left atrium mildly dilated.  There was trivial mitral and mild tricuspid valve regurgitation. All transvalvular gradients were noted to be normal providing no evidence suggestive of valvular stenosis.  Aorta normal in size with no evidence of aneurysmal dilatation.  Patient underwent diagnostic LEFT heart catheterization on 04/18/2015 revealing multivessel CAD; 80% distal LCx, 99% OM 2, 70% D1-1, 99% D1-2, 60% mid LAD, 70% mid to distal LAD, 75% distal LAD, and 100% proximal RCA.  Given the complexity and degree of patient's coronary artery disease, patient was transferred to [facility] for consultation with CVTS regarding revascularization.  Patient underwent four-vessel revascularization (CABG) on 04/25/2015.  LIMA-LAD, SVG-PDA, SVG-diagonal, and SVG-LCx bypass grafts were placed.  On POD-2 following CABG, patient out of the bed to the bathroom when atrial arrhythmia began.  Patient experienced palpitations.  He was found to be in atrial fibrillation with RVR.  Patient was started on IV amiodarone, which converted him to NSR.  Patient was not discharged home on antiarrhythmics.  He has had no recurrence; currently on no medications.  Most recent TTE was performed on 08/08/2021 revealing normal  left ventricular systolic function with an EF of >55%.  There was mild LVH. Left ventricular diastolic Doppler parameters consistent  with abnormal relaxation (G1DD).  Main pulmonary artery mildly dilated measuring 2.5 cm.  There was trivial mitral, tricuspid, and pulmonary valve regurgitation.  All transvalvular gradients were noted to be normal providing no evidence suggestive of valvular stenosis.  Ascending aorta moderately dilated measuring up to 4.0 cm.  Most recent myocardial perfusion imaging study was performed on 08/13/2021 revealing a mildly reduced left ventricular systolic function with an EF of 42%.  There was hypokinesis of the inferior wall.  Left ventricular cavity size mildly enlarged.  SPECT images demonstrate a moderate mixed perfusion abnormality of moderate intensity present in the inferior and anteroapical region on stress images.  Patient with a bundle branch block pattern.  Defect improved to some extent with rest.  Concern for possible ischemia.  Sensitivity and specificity of test somewhat limited by LBBB.  Blood pressure well controlled at 110/62 mmHg on currently prescribed nitrate (isosorbide mononitrate), and ARB (losartan) therapies.  Patient is on atorvastatin + PCSK9i (evolocumab) for his HLD diagnosis and ASCVD prevention. T2DM well controlled on currently prescribed regimen; last HgbA1c was 7.3% when checked on 04/26/2022.  In the setting of known cardiovascular disease and concurrent T2DM, patient is on an SGLT2i (dapagliflozin) for added cardiovascular and renovascular protection.  He does not have an OSAH diagnosis. Patient is able to complete all of his  ADL/IADLs without cardiovascular limitation.  Per the DASI, patient is able to achieve at least 4 METS of physical activity without experiencing any significant degree of angina/anginal equivalent symptoms.  Low-dose beta-blocker (metoprolol succinate 12.5 mg) was added.  No other changes were made to his medication  regimen.  Patient to follow-up with outpatient cardiology in 6 months or sooner if needed.  [name] is scheduled for EXCISION OF DUPUYTREN'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 RING FINGERS (Right: Hand) on 11/06/2022 with Dr. Leron Croak, MD.  Given patient's past medical history significant for cardiovascular diagnoses, presurgical cardiac clearance was sought by the PAT team. Per cardiology, "this patient is optimized for surgery and may proceed with the planned procedural course with a LOW risk of significant perioperative cardiovascular complications".  In review of his medication reconciliation, it is noted that patient is currently on prescribed daily antithrombotic therapy.  Given patient's significant cardiovascular history, patient to continue his daily low-dose ASA throughout his perioperative course.  Patient denies previous perioperative complications with anesthesia in the past. In review of the available records, it is noted that patient underwent a general anesthetic course here at [facility] (ASA III) in 07/2020 without documented complications.      10/30/2022   12:56 PM 07/24/2022   10:30 AM 05/16/2022    9:51 AM  Vitals with BMI  Height 5\' 10"  5\' 10"  5\' 10"   Weight 199 lbs 191 lbs 197 lbs 6 oz  BMI 28.55 27.41 28.32  Systolic  112 114  Diastolic  62 60  Pulse  80     Providers/Specialists:   NOTE: Primary physician provider listed below. Patient may have been seen by APP or partner within same practice.   PROVIDER ROLE / SPECIALTY LAST OV  [name], Robert Seltzer, MD Orthopedics (Surgeon) 10/15/2022  Robert Limerick, MD Primary Care Provider 07/24/2022  Robert Hitt, MD Cardiology 08/20/2022  Robert Mola, MD Endocrinology 10/25/2022  Robert Haagensen, MD Nephrology 08/08/2022   Allergies:  Patient has no known allergies.  Current Home Medications:   No current facility-administered medications for this encounter.     acetaminophen (TYLENOL) 500  MG tablet   aspirin 81 MG tablet   atorvastatin (LIPITOR) 40 MG tablet   dapagliflozin propanediol (FARXIGA) 10 MG TABS tablet   glipiZIDE (GLUCOTROL) 10 MG tablet   glucose blood (FREESTYLE LITE) test strip   isosorbide mononitrate (IMDUR) 60 MG 24 hr tablet   losartan (COZAAR) 50 MG tablet   metFORMIN (GLUCOPHAGE) 500 MG tablet   metoprolol succinate (TOPROL-XL) 25 MG 24 hr tablet   Study - EVOLVE-MI - evolocumab (REPATHA) 140 mg/mL SQ injection (PI-Stuckey)   History:   Past Medical History:  Diagnosis Date   Anemia    Ascending aorta dilatation (HCC) 08/08/2021   a.) TTE 08/08/2021: measured up to 4.0 cm   Beta thalassemia minor    CAD (coronary artery disease) 04/18/2015   a.) LHC 04/18/2015: 80% dLCx, 99% OM2, 70% D1-1, 99% D1-2, 60% mLAD, 70 m-dLAD, 75% dLAD, 100% pRCA -- CVTS consult; b.) s/p 4v CABG 04/21/2015   Dupuytren's contracture of both hands    a.) s/p release (LEFT) in ~2000   Dyspnea    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 Mod inf and anteroapical mixed perfusion defect concerning for ischemia   Laceration of left knee    LBBB (left bundle branch block)    Long-term use of aspirin therapy    NSTEMI (non-ST elevated myocardial infarction) (HCC) 04/16/2015   a.) troponins trended: 0.06 --> 0.35 --> 0.89 ng/L; b.) 80% dLCx, 99% OM2, 70% D1-1, 99% D1-2, 60% mLAD, 70 m-dLAD, 75% dLAD, 100% pRCA -- CVTS consult; c.) s/p 4v CABG 04/21/2015   Postoperative atrial fibrillation (HCC) 04/23/2015   a.) POD-2 following CABG -- OOB to BR when atrial arrythmia began (had RVR) --> started on IV amiodarone and converted to NSR   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   a.) LIMA-LAD, SVG-PD, SVG-diagonal, SVG-LCx   Stage 3a chronic kidney disease (CKD) (HCC)     Thrombocytopenia (HCC)    Type II diabetes mellitus (HCC)    Unstable angina (HCC)    Ventricular bigeminy    Past Surgical History:  Procedure Laterality Date   CARDIAC CATHETERIZATION N/A 04/18/2015   Procedure: Left Heart Cath and Coronary Angiography;  Surgeon: Robert Pea, MD;  Location: [facility] INVASIVE CV LAB;  Service: Cardiovascular;  Laterality: N/A;   CATARACT EXTRACTION W/ INTRAOCULAR LENS IMPLANT Right 07/14/2017   CATARACT EXTRACTION W/ INTRAOCULAR LENS IMPLANT Left 07/28/2017   COLONOSCOPY  2014   cleared for 10 yrs   CORONARY ARTERY BYPASS GRAFT N/A 04/21/2015   Procedure: CORONARY ARTERY BYPASS GRAFTING (CABG) X 4 UTILIZING THE LEFT INTERNAL MAMMARY ARTERY TO LAD, ENDOSCOPICALLY HARVESTED RIGHT SAPHENEOUS VEINS TO PD,DIAGONAL AND CIRCUMFLEX.;  Surgeon: Robert Ovens, MD;  Location: [facility] OR;  Service: Open Heart Surgery;  Laterality: N/A;   DUPUYTREN CONTRACTURE RELEASE Left ~ 2000   INCISION AND DRAINAGE Left 07/18/2020   Procedure: INCISION AND DRAINAGE LEFT KNEE;  Surgeon: Robert Marcus, MD;  Location: [facility] ORS;  Service: Orthopedics;  Laterality: Left;   KNEE ARTHROSCOPY Right ~ 1995   TEE WITHOUT CARDIOVERSION N/A 04/21/2015   Procedure: TRANSESOPHAGEAL ECHOCARDIOGRAM (TEE);  Surgeon: Robert Ovens, MD;  Location[facility];  Service: Open Heart Surgery;  Laterality: N/A;   Family History  Problem Relation Age of Onset   Diabetes Mother  Heart attack Other    Hypertension Other    Social History   Tobacco Use   Smoking status: Never   Smokeless tobacco: Never  Vaping Use   Vaping status: Never Used  Substance Use Topics   Alcohol use: Not Currently    Alcohol/week: 0.0 standard drinks of alcohol   Drug use: No    Pertinent Clinical Results:  LABS:   Component Ref Range & Units 08/08/2022  WBC 3.8 - 10.8 Thousand/uL 4.8  RBC 4.20 - 5.80 Million/uL 6.68 High   Hemoglobin 13.2 - 17.1 g/dL 16.1  Hematocrit 09.6 - 50.0 % 43.6  MCV 80.0  - 100.0 fL 65.3 Low   MCH 27.0 - 33.0 pg 19.9 Low   MCHC 32.0 - 36.0 g/dL 04.5 Low   RDW 40.9 - 81.1 % 19.7 High   Platelets 140 - 400 Thousand/uL 186  MPV 7.5 - 12.5 fL   Neutrophils Absolute 1500 - 7800 cells/uL 2,736  Lymphocytes Absolute 850 - 3900 cells/uL 1,339  Monocytes Absolute 200 - 950 cells/uL 422  Eosinophils Absolute 15 - 500 cells/uL 283  Basophils Absolute 0 - 200 cells/uL 19  Neutrophils Relative % 57  Lymphocytes % 27.9  Monocytes % 8.8  Eosinophils % 5.9  Basophils Relative % 0.4  Specimen Collected: 08/08/22 09:17   Performed by: [name] Last Resulted: 08/09/22 10:48  Received From: [facility]  Result Received: 10/07/22 09:11   Component Ref Range & Units 08/08/2022  Glucose 65 - 99 mg/dL 914 High   BUN 7 - 25 mg/dL 30 High   Creatinine 7.82 - 1.28 mg/dL 9.56 High   eGFR CKD-EPI CR 2021 > OR = 60 mL/min/1.74m2 41 Low   BUN/Creatinine Ratio 6 - 22 (calc) 18  Sodium 135 - 146 mmol/L 138  Potassium 3.5 - 5.3 mmol/L 5.0  Chloride 98 - 110 mmol/L 104  Bicarbonate (CO2) 20 - 32 mmol/L 25  Calcium 8.6 - 10.3 mg/dL 9.5  Phosphorus 2.1 - 4.3 mg/dL 3.4  Albumin 3.6 - 5.1 g/dL 4.4  Resulting Agency See order comments  Specimen Collected: 08/08/22 09:17   Performed by: [name] Last Resulted: 08/09/22 10:48  Received From: [facility]  Result Received: 10/07/22 09:11   Component Ref Range & Units 10/25/2022  Hemoglobin A1C <5.7 % 7.3 High   Average Blood Glucose (Calculated From HgBA1c Level) mg/dL 213  Resulting Agency [facility]  Narrative Performed by [facility] Between 5.7% and 6.4% is suggestive of Pre-Diabetes or controlled Diabetes. Greater than or equal to 6.5% is suggestive of Diabetes, and if more than one value, diagnostic.  Accuracy may be reduced by anemia, hemoglobinopathy, recent transfusion, sickle cell, artificial heart valve, dialysis, TIPS,  severe hyperglycemia, etc. Specimen Collected: 10/25/22 10:59   Performed by: [name] [facility] Last Resulted: 10/25/22 14:48  Received From: [facility]  Result Received: 10/30/22 09:29     ECG: Date: 10/30/2022 Time ECG obtained: 1336 PM Rate: 63 bpm Rhythm:  SR with PACs in pattern of bigeminy ; LBBB Intervals: PR 180 ms. QRS 148 ms. QTc 454 ms. ST segment and T wave changes: No evidence of acute ST segment elevation or depression Comparison: Similar to previous tracing obtained on 08/07/2021   IMAGING / PROCEDURES: MYOCARDIAL PERFUSION IMAGING STUDY (LEXISCAN) performed on 08/13/2021 Mildly reduced left ventricular systolic function with an EF of 42% Inferior wall hypokinesis Left ventricular cavity size noted to be enlarged Bundle-branch block pattern Fixed inferior defect with  anteroapical hypoperfusion with stress with improvement with rest to some extent.  Concern for possible ischemia.  Sensitivity of the study somewhat limited by LBBB.  Further evaluation warranted.  TRANSTHORACIC ECHOCARDIOGRAM performed on 08/08/2021 Normal left ventricular systolic function with an EF of >55% Mild concentric LVH Left ventricular diastolic Doppler parameters consistent with abnormal relaxation (G1DD). Aortic valve sclerosis with no evidence of stenosis Trivial mitral, tricuspid, and aortic valve regurgitation Normal gradients; no valvular stenosis Mildly dilated pulmonary artery Moderately dilated ascending aorta measuring up to 4.0 cm No pericardial effusion  Impression and Plan:  QUINDARRIUS JOPLIN has been referred for pre-anesthesia review and clearance prior to him undergoing the planned anesthetic and procedural courses. Available labs, pertinent testing, and imaging results were personally reviewed by me in preparation for upcoming operative/procedural course. [facility] medical record has been updated following extensive record review and  patient interview with PAT staff.   This patient has been appropriately cleared by cardiology with an overall LOW risk of experiencing significant perioperative cardiovascular complications. Based on clinical review performed today (11/01/22), barring any significant acute changes in the patient's overall condition, it is anticipated that he will be able to proceed with the planned surgical intervention. Any acute changes in clinical condition may necessitate his procedure being postponed and/or cancelled. Patient will meet with anesthesia team (MD and/or CRNA) on the day of his procedure for preoperative evaluation/assessment. Questions regarding anesthetic course will be fielded at that time.   Pre-surgical instructions were reviewed with the patient during his PAT appointment, and questions were fielded to satisfaction by PAT clinical staff. He has been instructed on which medications that he will need to hold prior to surgery, as well as the ones that have been deemed safe/appropriate to take on the day of his procedure. As part of the general education provided by PAT, patient made aware both verbally and in writing, that he would need to abstain from the use of any illegal substances during his perioperative course.  He was advised that failure to follow the provided instructions could necessitate case cancellation or result in serious perioperative complications up to and including death. Patient encouraged to contact PAT and/or his surgeon's office to discuss any questions or concerns that may arise prior to surgery; verbalized understanding.   Robert Mulling, MSN, APRN, FNP-C, CEN [facility]  Peri-operative Services Nurse Practitioner Phone: [phone] Fax: [phone] 11/01/22 3:36 PM  NOTE: This note has been prepared using Dragon dictation software. Despite my best ability to proofread, there is always the potential that unintentional transcriptional errors may still  occur from this process.

## 2022-11-01 ENCOUNTER — Encounter: Payer: Self-pay | Admitting: Surgery

## 2022-11-05 MED ORDER — CEFAZOLIN SODIUM-DEXTROSE 2-4 GM/100ML-% IV SOLN
2.0000 g | INTRAVENOUS | Status: AC
  Administered 2022-11-06: 2 g via INTRAVENOUS

## 2022-11-05 MED ORDER — SODIUM CHLORIDE 0.9 % IV SOLN: INTRAVENOUS | Status: DC

## 2022-11-05 MED ORDER — CHLORHEXIDINE GLUCONATE 0.12 % MT SOLN
15.0000 mL | Freq: Once | OROMUCOSAL | Status: AC
  Administered 2022-11-06: 15 mL via OROMUCOSAL

## 2022-11-05 MED ORDER — ORAL CARE MOUTH RINSE: 15.0000 mL | Freq: Once | OROMUCOSAL | Status: AC

## 2022-11-05 MED ORDER — FAMOTIDINE 20 MG PO TABS
20.0000 mg | ORAL_TABLET | Freq: Once | ORAL | Status: AC
  Administered 2022-11-06: 20 mg via ORAL

## 2022-11-06 ENCOUNTER — Other Ambulatory Visit: Payer: Self-pay

## 2022-11-06 ENCOUNTER — Ambulatory Visit
Admission: RE | Admit: 2022-11-06 | Discharge: 2022-11-06 | Disposition: A | Discharge status: Home / Self Care | Payer: Medicare Other | Attending: Surgery | Admitting: Surgery

## 2022-11-06 ENCOUNTER — Ambulatory Visit: Payer: Medicare Other | Admitting: Urgent Care

## 2022-11-06 ENCOUNTER — Encounter
Admission: RE | Disposition: A | Discharge status: Home / Self Care | Payer: Self-pay | Source: Home / Self Care | Attending: Surgery

## 2022-11-06 ENCOUNTER — Encounter: Payer: Self-pay | Admitting: Surgery

## 2022-11-06 DIAGNOSIS — Z7984 Long term (current) use of oral hypoglycemic drugs: Secondary | ICD-10-CM | POA: Insufficient documentation

## 2022-11-06 DIAGNOSIS — I10 Essential (primary) hypertension: Secondary | ICD-10-CM

## 2022-11-06 DIAGNOSIS — E785 Hyperlipidemia, unspecified: Secondary | ICD-10-CM | POA: Insufficient documentation

## 2022-11-06 DIAGNOSIS — I255 Ischemic cardiomyopathy: Secondary | ICD-10-CM | POA: Diagnosis not present

## 2022-11-06 DIAGNOSIS — D563 Thalassemia minor: Secondary | ICD-10-CM | POA: Insufficient documentation

## 2022-11-06 DIAGNOSIS — E1122 Type 2 diabetes mellitus with diabetic chronic kidney disease: Secondary | ICD-10-CM | POA: Diagnosis not present

## 2022-11-06 DIAGNOSIS — I129 Hypertensive chronic kidney disease with stage 1 through stage 4 chronic kidney disease, or unspecified chronic kidney disease: Secondary | ICD-10-CM | POA: Diagnosis not present

## 2022-11-06 DIAGNOSIS — Z01818 Encounter for other preprocedural examination: Secondary | ICD-10-CM

## 2022-11-06 DIAGNOSIS — Z951 Presence of aortocoronary bypass graft: Secondary | ICD-10-CM | POA: Insufficient documentation

## 2022-11-06 DIAGNOSIS — N1832 Chronic kidney disease, stage 3b: Secondary | ICD-10-CM

## 2022-11-06 DIAGNOSIS — Z01810 Encounter for preprocedural cardiovascular examination: Secondary | ICD-10-CM | POA: Diagnosis not present

## 2022-11-06 DIAGNOSIS — I252 Old myocardial infarction: Secondary | ICD-10-CM | POA: Insufficient documentation

## 2022-11-06 DIAGNOSIS — N183 Chronic kidney disease, stage 3 unspecified: Secondary | ICD-10-CM | POA: Diagnosis not present

## 2022-11-06 DIAGNOSIS — M720 Palmar fascial fibromatosis [Dupuytren]: Secondary | ICD-10-CM | POA: Diagnosis not present

## 2022-11-06 DIAGNOSIS — I25119 Atherosclerotic heart disease of native coronary artery with unspecified angina pectoris: Secondary | ICD-10-CM | POA: Diagnosis not present

## 2022-11-06 DIAGNOSIS — I447 Left bundle-branch block, unspecified: Secondary | ICD-10-CM | POA: Diagnosis not present

## 2022-11-06 HISTORY — PX: DUPUYTREN CONTRACTURE RELEASE: SHX1478

## 2022-11-06 HISTORY — DX: Palmar fascial fibromatosis (dupuytren): M72.0

## 2022-11-06 HISTORY — DX: Long term (current) use of aspirin: Z79.82

## 2022-11-06 LAB — GLUCOSE, CAPILLARY
Glucose-Capillary: 133 mg/dL — ABNORMAL HIGH (ref 70–99)
Glucose-Capillary: 140 mg/dL — ABNORMAL HIGH (ref 70–99)

## 2022-11-06 SURGERY — RELEASE, DUPUYTREN CONTRACTURE
Anesthesia: General | Site: Hand | Laterality: Right

## 2022-11-06 MED ORDER — LIDOCAINE HCL (CARDIAC) PF 100 MG/5ML IV SOSY
PREFILLED_SYRINGE | INTRAVENOUS | Status: DC | PRN
  Administered 2022-11-06: 80 mg via INTRAVENOUS

## 2022-11-06 MED ORDER — PHENYLEPHRINE 80 MCG/ML (10ML) SYRINGE FOR IV PUSH (FOR BLOOD PRESSURE SUPPORT)
PREFILLED_SYRINGE | INTRAVENOUS | Status: AC
  Filled 2022-11-06: qty 10

## 2022-11-06 MED ORDER — BUPIVACAINE HCL (PF) 0.5 % IJ SOLN
INTRAMUSCULAR | Status: AC
  Filled 2022-11-06: qty 30

## 2022-11-06 MED ORDER — ONDANSETRON HCL 4 MG/2ML IJ SOLN: 4.0000 mg | Freq: Once | INTRAMUSCULAR | Status: DC | PRN

## 2022-11-06 MED ORDER — ACETAMINOPHEN 10 MG/ML IV SOLN
INTRAVENOUS | Status: DC | PRN
  Administered 2022-11-06: 1000 mg via INTRAVENOUS

## 2022-11-06 MED ORDER — SEVOFLURANE IN SOLN
RESPIRATORY_TRACT | Status: AC
  Filled 2022-11-06: qty 250

## 2022-11-06 MED ORDER — METOCLOPRAMIDE HCL 5 MG/ML IJ SOLN: 5.0000 mg | Freq: Three times a day (TID) | INTRAMUSCULAR | Status: DC | PRN

## 2022-11-06 MED ORDER — BUPIVACAINE HCL (PF) 0.5 % IJ SOLN
INTRAMUSCULAR | Status: DC | PRN
  Administered 2022-11-06: 20 mL

## 2022-11-06 MED ORDER — ONDANSETRON HCL 4 MG/2ML IJ SOLN: 4.0000 mg | Freq: Four times a day (QID) | INTRAMUSCULAR | Status: DC | PRN

## 2022-11-06 MED ORDER — ONDANSETRON HCL 4 MG PO TABS: 4.0000 mg | ORAL_TABLET | Freq: Four times a day (QID) | ORAL | Status: DC | PRN

## 2022-11-06 MED ORDER — MIDAZOLAM HCL 2 MG/2ML IJ SOLN
INTRAMUSCULAR | Status: AC
  Filled 2022-11-06: qty 2

## 2022-11-06 MED ORDER — PROPOFOL 10 MG/ML IV BOLUS
INTRAVENOUS | Status: DC | PRN
  Administered 2022-11-06: 40 mg via INTRAVENOUS
  Administered 2022-11-06: 120 mg via INTRAVENOUS
  Administered 2022-11-06: 40 mg via INTRAVENOUS

## 2022-11-06 MED ORDER — ACETAMINOPHEN 10 MG/ML IV SOLN
INTRAVENOUS | Status: AC
  Filled 2022-11-06: qty 100

## 2022-11-06 MED ORDER — KETOROLAC TROMETHAMINE 15 MG/ML IJ SOLN
INTRAMUSCULAR | Status: AC
  Filled 2022-11-06: qty 1

## 2022-11-06 MED ORDER — HYDROCODONE-ACETAMINOPHEN 5-325 MG PO TABS: 1.0000 | ORAL_TABLET | Freq: Four times a day (QID) | ORAL | 0 refills | Status: AC | PRN

## 2022-11-06 MED ORDER — SODIUM CHLORIDE 0.9 % IV SOLN: INTRAVENOUS | Status: DC

## 2022-11-06 MED ORDER — DEXAMETHASONE SODIUM PHOSPHATE 10 MG/ML IJ SOLN
INTRAMUSCULAR | Status: DC | PRN
  Administered 2022-11-06: 5 mg via INTRAVENOUS

## 2022-11-06 MED ORDER — FENTANYL CITRATE (PF) 100 MCG/2ML IJ SOLN: 25.0000 ug | INTRAMUSCULAR | Status: DC | PRN

## 2022-11-06 MED ORDER — PHENYLEPHRINE HCL-NACL 20-0.9 MG/250ML-% IV SOLN
INTRAVENOUS | Status: DC | PRN
  Administered 2022-11-06: 40 ug/min via INTRAVENOUS
  Administered 2022-11-06: 100 ug via INTRAVENOUS

## 2022-11-06 MED ORDER — OXYCODONE HCL 5 MG PO TABS: 5.0000 mg | ORAL_TABLET | Freq: Once | ORAL | Status: DC | PRN

## 2022-11-06 MED ORDER — CEFAZOLIN SODIUM-DEXTROSE 2-4 GM/100ML-% IV SOLN
INTRAVENOUS | Status: AC
  Filled 2022-11-06: qty 100

## 2022-11-06 MED ORDER — CHLORHEXIDINE GLUCONATE 0.12 % MT SOLN
OROMUCOSAL | Status: AC
  Filled 2022-11-06: qty 15

## 2022-11-06 MED ORDER — DEXAMETHASONE SODIUM PHOSPHATE 10 MG/ML IJ SOLN
INTRAMUSCULAR | Status: AC
  Filled 2022-11-06: qty 1

## 2022-11-06 MED ORDER — FENTANYL CITRATE (PF) 100 MCG/2ML IJ SOLN
INTRAMUSCULAR | Status: AC
  Filled 2022-11-06: qty 2

## 2022-11-06 MED ORDER — 0.9 % SODIUM CHLORIDE (POUR BTL) OPTIME
TOPICAL | Status: DC | PRN
  Administered 2022-11-06: 500 mL

## 2022-11-06 MED ORDER — HYDROCODONE-ACETAMINOPHEN 5-325 MG PO TABS: 1.0000 | ORAL_TABLET | ORAL | Status: DC | PRN

## 2022-11-06 MED ORDER — KETOROLAC TROMETHAMINE 15 MG/ML IJ SOLN
15.0000 mg | Freq: Once | INTRAMUSCULAR | Status: AC
  Administered 2022-11-06: 15 mg via INTRAVENOUS

## 2022-11-06 MED ORDER — ACETAMINOPHEN 325 MG PO TABS: 325.0000 mg | ORAL_TABLET | Freq: Four times a day (QID) | ORAL | Status: DC | PRN

## 2022-11-06 MED ORDER — FAMOTIDINE 20 MG PO TABS
ORAL_TABLET | ORAL | Status: AC
  Filled 2022-11-06: qty 1

## 2022-11-06 MED ORDER — LOSARTAN POTASSIUM 50 MG PO TABS: 25.0000 mg | ORAL_TABLET | Freq: Every day | ORAL | Status: AC

## 2022-11-06 MED ORDER — ACETAMINOPHEN 10 MG/ML IV SOLN: 1000.0000 mg | Freq: Once | INTRAVENOUS | Status: DC | PRN

## 2022-11-06 MED ORDER — OXYCODONE HCL 5 MG/5ML PO SOLN: 5.0000 mg | Freq: Once | ORAL | Status: DC | PRN

## 2022-11-06 MED ORDER — ONDANSETRON HCL 4 MG/2ML IJ SOLN
INTRAMUSCULAR | Status: AC
  Filled 2022-11-06: qty 2

## 2022-11-06 MED ORDER — EPHEDRINE 5 MG/ML INJ
INTRAVENOUS | Status: AC
  Filled 2022-11-06: qty 5

## 2022-11-06 MED ORDER — EPHEDRINE SULFATE (PRESSORS) 50 MG/ML IJ SOLN
INTRAMUSCULAR | Status: DC | PRN
  Administered 2022-11-06: 5 mg via INTRAVENOUS
  Administered 2022-11-06 (×2): 10 mg via INTRAVENOUS

## 2022-11-06 MED ORDER — FENTANYL CITRATE (PF) 100 MCG/2ML IJ SOLN
INTRAMUSCULAR | Status: DC | PRN
  Administered 2022-11-06: 50 ug via INTRAVENOUS
  Administered 2022-11-06 (×3): 25 ug via INTRAVENOUS
  Administered 2022-11-06: 50 ug via INTRAVENOUS
  Administered 2022-11-06 (×2): 25 ug via INTRAVENOUS

## 2022-11-06 MED ORDER — ONDANSETRON HCL 4 MG/2ML IJ SOLN
INTRAMUSCULAR | Status: DC | PRN
  Administered 2022-11-06: 4 mg via INTRAVENOUS

## 2022-11-06 MED ORDER — METOCLOPRAMIDE HCL 10 MG PO TABS: 5.0000 mg | ORAL_TABLET | Freq: Three times a day (TID) | ORAL | Status: DC | PRN

## 2022-11-06 SURGICAL SUPPLY — 40 items
APL PRP STRL LF DISP 70% ISPRP (MISCELLANEOUS) ×1
BNDG CMPR 5X2 KNTD ELC UNQ LF (GAUZE/BANDAGES/DRESSINGS) ×1
BNDG CMPR 5X3 KNIT ELC UNQ LF (GAUZE/BANDAGES/DRESSINGS) ×1
BNDG CMPR 5X4 CHSV STRCH STRL (GAUZE/BANDAGES/DRESSINGS) ×1
BNDG CMPR 75X21 PLY HI ABS (MISCELLANEOUS) ×1
BNDG COHESIVE 4X5 TAN STRL LF (GAUZE/BANDAGES/DRESSINGS) ×1 IMPLANT
BNDG ELASTIC 2INX 5YD STR LF (GAUZE/BANDAGES/DRESSINGS) ×1 IMPLANT
BNDG ELASTIC 3INX 5YD STR LF (GAUZE/BANDAGES/DRESSINGS) ×1 IMPLANT
BNDG ESMARCH 4 X 12 STRL LF (GAUZE/BANDAGES/DRESSINGS) ×1
BNDG ESMARCH 4X12 STRL LF (GAUZE/BANDAGES/DRESSINGS) ×1 IMPLANT
CHLORAPREP W/TINT 26 (MISCELLANEOUS) ×1 IMPLANT
CORD BIP STRL DISP 12FT (MISCELLANEOUS) ×1 IMPLANT
CUFF TOURN SGL QUICK 18X4 (TOURNIQUET CUFF) ×1 IMPLANT
DRAPE SURG 17X11 SM STRL (DRAPES) ×1 IMPLANT
ELECT REM PT RETURN 9FT ADLT (ELECTROSURGICAL) ×1
ELECTRODE REM PT RTRN 9FT ADLT (ELECTROSURGICAL) ×1 IMPLANT
FORCEPS JEWEL BIP 4-3/4 STR (INSTRUMENTS) ×1 IMPLANT
GAUZE SPONGE 2X2 STRL 8-PLY (GAUZE/BANDAGES/DRESSINGS) ×1 IMPLANT
GAUZE SPONGE 4X4 12PLY STRL (GAUZE/BANDAGES/DRESSINGS) ×1 IMPLANT
GAUZE STRETCH 2X75IN STRL (MISCELLANEOUS) ×1 IMPLANT
GAUZE XEROFORM 1X8 LF (GAUZE/BANDAGES/DRESSINGS) ×1 IMPLANT
GLOVE BIO SURGEON STRL SZ8 (GLOVE) ×2 IMPLANT
GLOVE INDICATOR 8.0 STRL GRN (GLOVE) ×1 IMPLANT
GOWN STRL REUS W/ TWL LRG LVL3 (GOWN DISPOSABLE) ×1 IMPLANT
GOWN STRL REUS W/ TWL XL LVL3 (GOWN DISPOSABLE) ×1 IMPLANT
GOWN STRL REUS W/TWL LRG LVL3 (GOWN DISPOSABLE) ×1
GOWN STRL REUS W/TWL XL LVL3 (GOWN DISPOSABLE) ×1
KIT TURNOVER KIT A (KITS) ×1 IMPLANT
MANIFOLD NEPTUNE II (INSTRUMENTS) ×1 IMPLANT
NS IRRIG 1000ML POUR BTL (IV SOLUTION) ×1 IMPLANT
NS IRRIG 500ML POUR BTL (IV SOLUTION) ×1 IMPLANT
PACK EXTREMITY ARMC (MISCELLANEOUS) ×1 IMPLANT
PAD PREP OB/GYN DISP 24X41 (PERSONAL CARE ITEMS) ×1 IMPLANT
STOCKINETTE IMPERVIOUS 9X36 MD (GAUZE/BANDAGES/DRESSINGS) ×1 IMPLANT
SUT ETHILON 4 0 PS 2 18 (SUTURE) IMPLANT
SUT PROLENE 4 0 PS 2 18 (SUTURE) ×1 IMPLANT
SUT VIC AB 3-0 SH 27 (SUTURE)
SUT VIC AB 3-0 SH 27X BRD (SUTURE) ×1 IMPLANT
TRAP FLUID SMOKE EVACUATOR (MISCELLANEOUS) ×1 IMPLANT
WATER STERILE IRR 500ML POUR (IV SOLUTION) ×1 IMPLANT

## 2022-11-06 NOTE — Op Note (Signed)
11/06/2022  2:29 PM  Patient:   [name]  Pre-Op Diagnosis:   Dupuytren's contractures, right long and ring fingers.  Post-Op Diagnosis:   Same.  Procedure:   Release of Dupuytren's contractures, right long and ring fingers.  Surgeon:   Maryagnes Amos, MD  Assistant:   Rosezena Sensor, PA-S  Anesthesia:   General LMA  Findings:   As above.  Complications:   None  EBL:   2 cc  Fluids:   600 cc crystalloid  TT:   124 minutes at 250 mmHg  Drains:   None  Closure:   4-0 Prolene interrupted sutures  Brief Clinical Note:   The patient is a 77 year old male with a long history of progressively worsening flexion contracture of his right long and ring fingers. These symptoms have progressed despite medications, activity modification, etc. The patient's history and examination are consistent with a Dupuytren's contracture of the right long and ring fingers. The patient presents at this time for release of the Dupuytren's contractures of the right long and ring fingers.  Procedure:   The patient was brought into the operating room and lain in the supine position. After adequate general laryngeal mask anesthesia was obtained, the right hand and upper extremity were prepped with ChloraPrep solution before being draped sterilely. Preoperative antibiotics were administered. A timeout was performed to verify the appropriate surgical site.    A Brunner type zigzag incision was made along the volar aspect of the right ring finger beginning just proximal to the proximal palmar crease and extending to the just beyond the PIP flexion crease. The incision was carried down through subcutaneous tissues. The fibrous cord was identified and carefully dissected out from proximal to distal after releasing it proximally. As dissection was carried out, care was taken to identify and protect the common digital nerve and artery on either side of the cord, as well as the underlying flexor tendon,  proximally. More distally, the digital neurovascular bundles were identified and protected. After the mass was removed in its entirety, the adequacy of excision was verified by palpation as well as visually. After excision of the Dupuytren's tissue, the right ring finger MCP joint could be extended fully, but the PIP joint still exhibited an approximately 40 degree flexion contracture.  Therefore, an osteolysis of the PIP joint was performed.  After a palpable and audible pop, the PIP joint could be extended to within 10 degrees of full extension.  Next, the Dupuytren's contracture involving the right long PIP joint was addressed. A Brunner type zigzag incision was made along the volar aspect of the right long finger beginning just proximal to the MCP flexion crease and extending to the just beyond the PIP flexion crease. The incision was carried down through subcutaneous tissues. The fibrous cord was identified and carefully dissected out from proximal to distal after releasing it proximally. As dissection was carried out, the digital neurovascular bundles were identified and protected.  After excision of the Dupuytren's tissue, the right long finger MCP joint could be extended fully, but the PIP joint still exhibited an approximately 30 degree flexion contracture.  Therefore, an osteolysis of the PIP joint was performed.  After a palpable and audible pop, the PIP joint could be extended to within 10 degrees of full extension.  The wound was copiously irrigated with sterile saline solution before the skin was reapproximated using 4-0 Prolene interrupted sutures. A total of 20 cc of 0.5% plain Sensorcaine was injected in and around the  incision to help with postoperative analgesia before a sterile bulky dressing and volar splint extending to the fingertips was applied, maintaining the MCP joints in extension. The patient was then awakened and returned to the recovery room in satisfactory condition after  tolerating the procedure well.

## 2022-11-06 NOTE — H&P (Signed)
History of Present Illness:  [name] is a 77 y.o. male who presents for evaluation and treatment of a slowly worsening painful contracture of his right ring and long fingers. The patient has a history of a Dupuytren's contracture release of his left little finger done by Dr. Erin Sons many years ago from which he has done quite well. Over the past few years, he has noticed a progressive worsening of the flexion contracture of his right ring finger, as well as the evolution of a contracture of the right long PIP joint. He finds that he is having increasing difficulty performing fine activities with his right hand and has difficulty getting his hand into his pants pocket, or to pull out change or other objects from his pocket. He saw Dr. Rosita Kea for this issue who has referred him to me for further evaluation and treatment. The patient denies any numbness or paresthesias to his fingers, and denies any recent or remote history of injury to the hand.  Current Outpatient Medications:  acetaminophen (TYLENOL) 500 MG tablet Take 1,000 mg by mouth every 8 (eight) hours  aspirin 81 MG EC tablet Take 1 tablet (81 mg total) by mouth once daily 30 tablet 0  atorvastatin (LIPITOR) 40 MG tablet Take 1 tablet (40 mg total) by mouth once daily. (Patient taking differently: Take 40 mg by mouth every evening) 90 tablet 2  evolocumab (REPATHA SURECLICK) 140 mg/mL PnIj Inject 140 mg subcutaneously every 14 (fourteen) days 6 mL 3  FARXIGA 10 mg tablet TAKE 10 MG BY MOUTH 1 (ONE) TIME EACH DAY IN THE MORNING  FREESTYLE TEST test strip TEST 1 TIME DAILY 0  glipiZIDE (GLUCOTROL) 10 MG tablet Take 10 mg by mouth 2 (two) times daily before meals  isosorbide mononitrate (IMDUR) 60 MG ER tablet Take 1 tablet (60 mg total) by mouth once daily 30 tablet 11  losartan (COZAAR) 25 MG tablet Take 1 tablet (25 mg total) by mouth once daily (Patient taking differently: Take 12.5 mg by mouth once daily) 30 tablet 11  metFORMIN  (GLUCOPHAGE) 500 MG tablet TAKE 1 TABLET (500 MG TOTAL) BY MOUTH 2 (TWO) TIMES DAILY WITH A MEAL.  metoprolol succinate (TOPROL-XL) 25 MG XL tablet Take 0.5 tablets (12.5 mg total) by mouth once daily 15 tablet 11   Allergies: No Known Allergies  Past Medical History:  Arrhythmia  Atrial fibrillation (CMS/HHS-HCC)  Coronary artery disease  Hypertension  Type 2 diabetes mellitus (CMS/HHS-HCC)   Past Surgical History:  KNEE ARTHROSCOPY 1994 (Right knee)  DUPUYTREN / PALMAR FASCIOTOMY Left 08/07/11 (Left little finger)  COLONOSCOPY 04/28/12 (Internal hemorrhoids, diverticulosis)  EXTRACTION CATARACT EXTRACAPSULAR W/INSERTION INTRAOCULAR PROSTHESIS Right 07/14/2017  Procedure: Lensx/ TORIC------EXTRACTION CATARACT EXTRACAPSULAR W/INSERTION INTRAOCULAR PROSTHESIS; Surgeon: Synetta Fail, MD; [facility]; Service: Ophthalmology; Laterality: Right;  EXTRACTION CATARACT EXTRACAPSULAR W/INSERTION INTRAOCULAR PROSTHESIS Left 07/28/2017  Procedure: Lensx------EXTRACTION CATARACT EXTRACAPSULAR W/INSERTION INTRAOCULAR PROSTHESIS; Surgeon: Synetta Fail, MD; [facility]; Service: Ophthalmology; Laterality: Left;  CARDIAC CATHETERIZATION  CARDIAC SURGERY  CORONARY ARTERY BYPASS GRAFT   Family History:  Diabetes type II Mother  Myocardial Infarction (Heart attack) Mother (in her 59's)  High blood pressure (Hypertension) Mother  Cataracts Mother  Glaucoma Mother  No Known Problems Sister  No Known Problems Sister  No Known Problems Brother  Macular degeneration Maternal Uncle  Heart disease Maternal Grandfather  Cancer Maternal Grandfather (lung)  Myocardial Infarction (Heart attack) Maternal Grandfather (in her 84's)   Social History:   Socioeconomic History:  Marital status: Married  Tobacco Use  Smoking status: Never  Smokeless tobacco: Never  Vaping Use  Vaping status: Never Used  Substance and Sexual Activity  Alcohol use: No  Comment: glass of  wine occassionally  Drug use: No   Social Determinants of Health:   Physicist, medical Strain: Low Risk (10/24/2022)  Overall Financial Resource Strain (CARDIA)  Difficulty of Paying Living Expenses: Not hard at all  Food Insecurity: No Food Insecurity (10/24/2022)  Hunger Vital Sign  Worried About Running Out of Food in the Last Year: Never true  Ran Out of Food in the Last Year: Never true  Transportation Needs: No Transportation Needs (10/24/2022)  PRAPARE - Risk analyst (Medical): No  Lack of Transportation (Non-Medical): No   Review of Systems:  A comprehensive 14 point ROS was performed, reviewed, and the pertinent orthopaedic findings are documented in the HPI.  Physical Exam: Vitals:  10/30/22 1051  BP: 132/86  Weight: 90.4 kg (199 lb 6.4 oz)  Height: 177.8 cm (5\' 10" )  PainSc: 0-No pain  PainLoc: Hand   General/Constitutional: The patient appears to be well-nourished, well-developed, and in no acute distress. Neuro/Psych: Normal mood and affect, oriented to person, place and time. Eyes: Non-icteric. Pupils are equal, round, and reactive to light, and exhibit synchronous movement. ENT: Unremarkable. Lymphatic: No palpable adenopathy. Respiratory: Lungs clear to auscultation, Normal chest excursion, and Non-labored breathing. Mild end expiratory wheeze bilaterally. Cardiovascular: Regular rate and rhythm. No murmurs. and No edema, swelling or tenderness, except as noted in detailed exam. Integumentary: No impressive skin lesions present, except as noted in detailed exam. Musculoskeletal: Unremarkable, except as noted in detailed exam.  Right hand exam: Skin inspection of the right hand is notable for a moderate contracture of the right ring finger with a palpable cord extending from his mid palm to the DIP flexion crease. The MCP joint is flexed to approximately 60 degrees whereas the PIP joint is flexed to approximately 90 degrees. In addition,  there is a smaller cord along the radial aspect of his long finger extending from the MCP flexion crease to the DIP flexion crease, resulting in an approximately 40 degree flexion contracture of the long PIP joint. Otherwise, no swelling, erythema, ecchymosis, abrasions, or other skin abnormalities are identified. He has no tenderness to palpation over the dorsal or palmar aspects of his right hand. He exhibits full active and passive range of motion of the thumb, index, and little fingers without pain or triggering. He is grossly neurovascularly intact to all digits.  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   Plan: The treatment options were discussed with the patient. In addition, patient educational materials were provided regarding the diagnosis and treatment options. The patient is quite frustrated by his symptoms and functional limitations, and is ready to consider more aggressive treatment options. Therefore, I have recommended a surgical procedure, specifically an excision of the Dupuytren's contractures involving the right long and ring fingers. The procedure was discussed with the patient, as were the potential risks (including bleeding, infection, nerve and/or blood vessel injury, persistent or recurrent pain/stiffness, weakness of grip, need for further surgery, blood clots, strokes, heart attacks and/or arhythmias, pneumonia, etc.) and benefits. The patient states his understanding and wishes to proceed. All of the patient's questions and concerns were answered. He can call any time with further concerns. He will follow up post-surgery, routine.    H&P reviewed and patient re-examined. No changes.

## 2022-11-06 NOTE — Anesthesia Preprocedure Evaluation (Signed)
Anesthesia Evaluation  Patient identified by MRN, date of birth, ID band Patient awake    Reviewed: Allergy & Precautions, NPO status , Patient's Chart, lab work & pertinent test results  History of Anesthesia Complications Negative for: history of anesthetic complications  Airway Mallampati: II  TM Distance: >3 FB Neck ROM: Full    Dental no notable dental hx. (+) Teeth Intact   Pulmonary shortness of breath and with exertion, neg sleep apnea, neg COPD, Patient abstained from smoking.Not current smoker   Pulmonary exam normal breath sounds clear to auscultation       Cardiovascular Exercise Tolerance: Good METS: 3 - Mets hypertension, + angina with exertion + CAD and + Past MI  + dysrhythmias  Rhythm:Regular Rate:Bradycardia - Systolic murmurs  Heart rate in the 30's in preop per the preop nurses. On 12 lead EKG, rate was 49bpm, otherwise similar morphology to previous ekgs (preexisting LBBB). Patient asymptomatic today, denies lightheadedness, dizziness, chest pain, SOB. Recently deemed optimized at low risk for cardiac complications by his cardiologist   Neuro/Psych negative neurological ROS  negative psych ROS   GI/Hepatic ,neg GERD  ,,(+)     (-) substance abuse    Endo/Other  diabetes, Well Controlled, Oral Hypoglycemic Agents    Renal/GU CRFRenal disease     Musculoskeletal   Abdominal   Peds  Hematology   Anesthesia Other Findings Past Medical History: No date: Anemia 08/08/2021: Ascending aorta dilatation (HCC)     Comment:  a.) TTE 08/08/2021: measured up to 4.0 cm No date: Beta thalassemia minor 04/18/2015: CAD (coronary artery disease)     Comment:  a.) LHC 04/18/2015: 80% dLCx, 99% OM2, 70% D1-1, 99%               D1-2, 60% mLAD, 70 m-dLAD, 75% dLAD, 100% pRCA -- CVTS               consult; b.) 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 Mod inf and anteroapical               mixed perfusion defect concerning for ischemia No date: Laceration of left knee No date: LBBB (left bundle branch block) No date: Long-term use of aspirin therapy 04/16/2015: NSTEMI (non-ST elevated myocardial infarction) (HCC)     Comment:  a.) troponins trended: 0.06 --> 0.35 --> 0.89 ng/L; b.)               80% dLCx, 99% OM2, 70% D1-1, 99% D1-2, 60% mLAD, 70               m-dLAD, 75% dLAD, 100% pRCA -- CVTS consult; c.) s/p 4v               CABG 04/21/2015 04/23/2015: Postoperative atrial fibrillation (HCC)     Comment:  a.) POD-2 following CABG -- OOB to BR when atrial               arrythmia began (had RVR) --> started on IV amiodarone               and converted to NSR 04/21/2015: S/P CABG x 4  Comment:  a.) LIMA-LAD, SVG-PD, SVG-diagonal, SVG-LCx No date: Stage 3a chronic kidney disease (CKD) (HCC) No date: Thrombocytopenia (HCC) No date: Type II diabetes mellitus (HCC) No date: Unstable angina (HCC) No date: Ventricular bigeminy  Reproductive/Obstetrics                             Anesthesia Physical Anesthesia Plan  ASA: 3  Anesthesia Plan: General   Post-op Pain Management: Ofirmev IV (intra-op)*   Induction: Intravenous  PONV Risk Score and Plan: 2 and Ondansetron, Dexamethasone and Treatment may vary due to age or medical condition  Airway Management Planned: LMA  Additional Equipment: None  Intra-op Plan:   Post-operative Plan: Extubation in OR  Informed Consent: I have reviewed the patients History and Physical, chart, labs and discussed the procedure including the risks, benefits and  alternatives for the proposed anesthesia with the patient or authorized representative who has indicated his/her understanding and acceptance.     Dental advisory given  Plan Discussed with: CRNA and Surgeon  Anesthesia Plan Comments: (Discussed risks of anesthesia with patient, including PONV, sore throat, lip/dental/eye damage. Rare risks discussed as well, such as cardiorespiratory and neurological sequelae, and allergic reactions. Discussed the role of CRNA in patient's perioperative care. Patient understands.)       Anesthesia Quick Evaluation

## 2022-11-06 NOTE — Discharge Instructions (Addendum)
Orthopedic discharge instructions: Keep splint dry and intact. Keep hand elevated above heart level. Apply ice to affected area frequently. Take ibuprofen 600-800 mg TID with meals for 3-5 days, then as necessary. Take pain medication as prescribed or ES Tylenol when needed.  Return for follow-up in 10-14 days or as scheduled     .AMBULATORY SURGERY  DISCHARGE INSTRUCTIONS   The drugs that you were given will stay in your system until tomorrow so for the next 24 hours you should not:  Drive an automobile Make any legal decisions Drink any alcoholic beverage   You may resume regular meals tomorrow.  Today it is better to start with liquids and gradually work up to solid foods.  You may eat anything you prefer, but it is better to start with liquids, then soup and crackers, and gradually work up to solid foods.   Please notify your doctor immediately if you have any unusual bleeding, trouble breathing, redness and pain at the surgery site, drainage, fever, or pain not relieved by medication.

## 2022-11-06 NOTE — Anesthesia Postprocedure Evaluation (Signed)
Anesthesia Post Note  Patient: [name]  Procedure(s) Performed: EXCISION OF DUPUYTREN'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 RING FINGERS (Right: Hand)  Patient location during evaluation: PACU Anesthesia Type: General Level of consciousness: awake and alert Pain management: pain level controlled Vital Signs Assessment: post-procedure vital signs reviewed and stable Respiratory status: spontaneous breathing, nonlabored ventilation, respiratory function stable and patient connected to nasal cannula oxygen Cardiovascular status: blood pressure returned to baseline and stable Postop Assessment: no apparent nausea or vomiting Anesthetic complications: no   No notable events documented.   Last Vitals:  Vitals:   11/06/22 1431 11/06/22 1445  BP: 106/61 117/61  Pulse: (!) 56 (!) 59  Resp: 18 17  Temp:  (!) 36.1 C  SpO2: 100% 97%    Last Pain:  Vitals:   11/06/22 1431  PainSc: 0-No pain                 Corinda Gubler

## 2022-11-06 NOTE — Transfer of Care (Signed)
Immediate Anesthesia Transfer of Care Note  Patient: [name]  Procedure(s) Performed: EXCISION OF DUPUYTREN'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 RING FINGERS (Right: Hand)  Patient Location: PACU  Anesthesia Type:General  Level of Consciousness: drowsy  Airway & Oxygen Therapy: Patient Spontanous Breathing and Patient connected to face mask oxygen  Post-op Assessment: Report given to RN and Post -op Vital signs reviewed and stable  Post vital signs: Reviewed and stable  Last Vitals:  Vitals Value Taken Time  BP 98/57 11/06/22 1416  Temp 36.4 C 11/06/22 1415  Pulse 55 11/06/22 1419  Resp 27 11/06/22 1419  SpO2 100 % 11/06/22 1419  Vitals shown include unfiled device data.  Last Pain:  Vitals:   11/06/22 0849  PainSc: 0-No pain      Patients Stated Pain Goal: 0 (11/06/22 0849)  Complications: No notable events documented.

## 2022-11-06 NOTE — Anesthesia Procedure Notes (Signed)
Procedure Name: LMA Insertion Date/Time: 11/06/2022 11:42 AM  Performed by: Monico Hoar, CRNAPre-anesthesia Checklist: Patient identified, Emergency Drugs available, Suction available and Patient being monitored Oxygen Delivery Method: Circle system utilized Preoxygenation: Pre-oxygenation with 100% oxygen Induction Type: IV induction LMA: LMA inserted LMA Size: 4.0 Number of attempts: 1 Placement Confirmation: positive ETCO2 Tube secured with: Tape

## 2022-11-08 DIAGNOSIS — D492 Neoplasm of unspecified behavior of bone, soft tissue, and skin: Secondary | ICD-10-CM | POA: Diagnosis not present

## 2022-11-08 DIAGNOSIS — L570 Actinic keratosis: Secondary | ICD-10-CM | POA: Diagnosis not present

## 2022-11-08 DIAGNOSIS — Z872 Personal history of diseases of the skin and subcutaneous tissue: Secondary | ICD-10-CM | POA: Diagnosis not present

## 2022-11-08 DIAGNOSIS — L578 Other skin changes due to chronic exposure to nonionizing radiation: Secondary | ICD-10-CM | POA: Diagnosis not present

## 2022-11-25 ENCOUNTER — Encounter: Payer: Self-pay | Admitting: Occupational Therapy

## 2022-11-25 ENCOUNTER — Ambulatory Visit: Payer: Medicare Other | Attending: Physician Assistant | Admitting: Occupational Therapy

## 2022-11-25 DIAGNOSIS — L905 Scar conditions and fibrosis of skin: Secondary | ICD-10-CM | POA: Insufficient documentation

## 2022-11-25 DIAGNOSIS — M25642 Stiffness of left hand, not elsewhere classified: Secondary | ICD-10-CM | POA: Diagnosis not present

## 2022-11-25 DIAGNOSIS — M6281 Muscle weakness (generalized): Secondary | ICD-10-CM | POA: Insufficient documentation

## 2022-11-25 DIAGNOSIS — R600 Localized edema: Secondary | ICD-10-CM | POA: Diagnosis not present

## 2022-11-25 NOTE — Therapy (Signed)
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 Mod inf and anteroapical mixed perfusion defect concerning for ischemia   Laceration of left knee    LBBB (left bundle branch block)    Long-term use of aspirin therapy    NSTEMI (non-ST elevated myocardial infarction) (HCC) 04/16/2015   a.) troponins trended: 0.06 --> 0.35 --> 0.89 ng/L; b.) 80% dLCx, 99% OM2, 70% D1-1, 99% D1-2, 60% mLAD, 70 m-dLAD, 75% dLAD, 100% pRCA -- CVTS consult; c.) s/p 4v CABG 04/21/2015   Postoperative atrial fibrillation (HCC) 04/23/2015   a.) POD-2 following CABG -- OOB to BR when atrial arrythmia began (had RVR) --> started on IV amiodarone and converted to NSR   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   a.) LIMA-LAD, SVG-PD, SVG-diagonal, SVG-LCx    Stage 3a chronic kidney disease (CKD) (HCC)    Thrombocytopenia (HCC)    Type II diabetes mellitus (HCC)    Unstable angina (HCC)    Ventricular bigeminy    Past Surgical History:  Procedure Laterality Date   CARDIAC CATHETERIZATION N/A 04/18/2015   Procedure: Left Heart Cath and Coronary Angiography;  Surgeon: Alwyn Pea, MD;  Location: [facility] INVASIVE CV LAB;  Service: Cardiovascular;  Laterality: N/A;   CATARACT EXTRACTION W/ INTRAOCULAR LENS IMPLANT Right 07/14/2017   CATARACT EXTRACTION W/ INTRAOCULAR LENS IMPLANT Left 07/28/2017   COLONOSCOPY  2014   cleared for 10 yrs   CORONARY ARTERY BYPASS GRAFT N/A 04/21/2015   Procedure: CORONARY ARTERY BYPASS GRAFTING (CABG) X 4 UTILIZING THE LEFT INTERNAL MAMMARY ARTERY TO LAD, ENDOSCOPICALLY HARVESTED RIGHT SAPHENEOUS VEINS TO PD,DIAGONAL AND CIRCUMFLEX.;  Surgeon: Delight Ovens, MD;  Location: [facility] OR;  Service: Open Heart Surgery;  Laterality: N/A;   DUPUYTREN CONTRACTURE RELEASE Left ~ 2000   DUPUYTREN CONTRACTURE RELEASE Right 11/06/2022   Procedure: EXCISION OF DUPUYTREN'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 RING FINGERS;  Surgeon: Christena Flake, MD;  Location: [facility] ORS;  Service: Orthopedics;  Laterality: Right;   INCISION AND DRAINAGE Left 07/18/2020   Procedure: INCISION AND DRAINAGE LEFT KNEE;  Surgeon: Ross Marcus, MD;  Location: [facility] ORS;  Service: Orthopedics;  Laterality: Left;   KNEE ARTHROSCOPY Right ~ 1995   TEE WITHOUT CARDIOVERSION N/A 04/21/2015   Procedure: TRANSESOPHAGEAL ECHOCARDIOGRAM (TEE);  Surgeon: Delight Ovens, MD;  Location: [facility] OR;  Service: Open Heart Surgery;  Laterality: N/A;   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)    Hyperlipidemia    Laceration of left knee 07/18/2020   Knee laceration, left, initial encounter 07/18/2020   Hyperlipidemia  associated with type 2 diabetes mellitus (HCC) 05/13/2019   Erectile dysfunction 05/13/2019   Pyuria 01/30/2019   Stage 3 chronic kidney disease (HCC) 01/30/2019   Beta thalassemia minor 03/20/2016   Anemia 03/06/2016   CAD of autologous bypass graft 05/10/2015   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   Presence of aortocoronary bypass graft 04/21/2015   Thrombocytopenia (HCC) 04/20/2015   Coronary artery disease 04/18/2015   Unstable angina (HCC) 04/17/2015   Elevated troponin 04/17/2015   DM type 2 causing CKD stage 3 (HCC) 04/17/2015   Essential hypertension 04/17/2015    ONSET DATE: 11/06/22  REFERRING DIAG: Right hand Dupuytren's release third /fourth digits  THERAPY DIAG:  Scar condition and fibrosis of skin  Stiffness of left hand, not elsewhere classified  Localized edema  Muscle weakness (generalized)  Rationale for Evaluation and Treatment:   SUBJECTIVE:   SUBJECTIVE STATEMENT: I have seen years ago for the left hand.  The right hand just has more swelling this time.  And the Steri-Strips is still on. Pt accompanied by: self  PERTINENT HISTORY: Ortho note 11/20/22  - [name] is a 77 y.o. who presents today status post excision of Dupuytren contracture of right long and right ring finger performed on 11/06/2022. Patient doing well. Neurologically he is the same as he was preoperatively. 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 Pain is controllable. Patient's only concern is that it does seem to be a lot more swollen than he anticipated.  Stitches removed Steri-Strips applied referred to OT   PRECAUTIONS: None      WEIGHT BEARING RESTRICTIONS: Steri-Strips still in place  PAIN:  Are you having pain?  2/10 in the R palm  FALLS: Has patient fallen in last 6 months? No  LIVING ENVIRONMENT: Lives with: lives with their family and lives with their spouse     PLOF: Had years ago Dupuytren's release on the left hand that was successful-right  hand got worse the last few years contracting  PATIENT GOALS: To maintain progress with surgery and extension to be able to put gloves on, put hand in pocket and  grasping objects better  NEXT MD VISIT: 16 Sept  OBJECTIVE:   HAND DOMINANCE: Right  A  UPPER EXTREMITY ROM:     Active ROM Right eval Left eval  Shoulder flexion    Shoulder abduction    Shoulder adduction    Shoulder extension    Shoulder internal rotation    Shoulder external rotation    Elbow flexion    Elbow extension    Wrist flexion 55   Wrist extension 55   Wrist ulnar deviation WNL   Wrist radial deviation WNL   Wrist pronation    Wrist supination    (Blank rows = not tested)  Active ROM Right eval Left eval  Thumb MCP (0-60)  Thumb IP (0-80)    Thumb Radial abd/add (0-55) 50    Thumb Palmar abd/add (0-45) 55    Thumb Opposition to Small Finger     Index MCP (0-90) 65    Index PIP (0-100) 100    Index DIP (0-70)      Long MCP (0-90)  75    Long PIP (0-100)  90 ( -35 ext    Long DIP (0-70)      Ring MCP (0-90)  80    Ring PIP (0-100) 90 (-40 ext    Ring DIP (0-70)      Little MCP (0-90)  80    Little PIP (0-100)  95 (-30 ext    Little DIP (0-70)      (Blank rows = not tested)    EDEMA: Edema in hand - and all digits - tight feeling   COGNITION: Overall cognitive status: Within functional limits for tasks assessed    OBSERVATIONS: Patient arrive with Steri-Strips in place-starting to loosen up.  Removed Steri-Strips and cleaned up dry skin on third digit.  Applied 2 small Steri-Strips on proximal phalanges on the fourth, and 1 on the palm at the distal palmar crease.  Fitted patient with Tubigrip D to use during the day for barrier.   TODAY'S TREATMENT:                                                                                                                              DATE: 11/25/22 Done with patient contrast and recommended to do 3 times a day prior to gentle rolling for  digit extension and soft tissue massage over red roller 20 reps pain-free With Tubigrip D on Tendon glides 10 reps blocking proximal distal palmar crease as well as proximal phalanges And composite fist to red roller-with active assisted range of motion for extension of digits in between Active range of motion for palmar radial abduction of the thumb Silicone compression sleeve for third digit on and off during the day and nighttime 3 times a day to do home exercises Fabricated patient dorsal hand-based extension splint for 3rd through 5th digits-patient educated on donning and doffing as well as wearing to maintain extension.   PATIENT EDUCATION: Education details: Findings of evaluation, home exercises and splint use Person educated: Patient Education method: Explanation, Demonstration, Tactile cues, Verbal cues, and Handouts Education comprehension: verbalized understanding, returned demonstration, verbal cues required, tactile cues required, and needs further education    GOALS: Goals reviewed with patient? Yes  SHORT TERM GOALS: Target date: 1 wk   Patient to be independent and wearing of hand-based extension splint at nighttime to maintain extension of third and fourth digits Baseline: Patient arrived with Steri-Strips on focusing more on fisting and extension, -30 and -40 PIP extension at third and fourth right hand Goal status: INITIAL  LONG TERM GOALS: Target date: 8 weeks  Flexion of right digits improved for patient to touch palm to  initiate use in ADLs with no increase symptoms Baseline: Steri-Strips removed today applied to more again-[facility] flexion 65 to 80 degrees, PIP 90 to 100 degrees-discomfort and pain 1-2/10 Goal status: INITIAL  2.  Patient maintain extension of right digits third and fourth better than -30 and-40 degrees to be able to don gloves, wash face and reach for cylinder objects without issues Baseline: Patient arrived with -30 to -40 PIP extension,  Goal  status: INITIAL  3.  Scar tissue improved for patient to be able to make composite fist as well as maintain extension and tolerate using tools Baseline: Steri-Strips removed today; not initiating scar massage yet ;reapplied 2-3 Steri-Strips again; unable to make composite fist as well as extension at PIPs -30 to -40 degrees Goal status: INITIAL  4.  Right grip and prehension strength increased to more than 75% compared to the left for patient to initiate using hand with tools and yard work without increased issues. Baseline: Steri-Strips removed and reapplied to 3; flexion extension still limited, scar massage not initiated yet-patient 3 weeks postop Goal status: INITIAL  ASSESSMENT:  CLINICAL IMPRESSION: Patient present at occupational therapy evaluation about 3 weeks postop Dupuytren's release on the right dominant hand for third and fourth digits.  Patient arrived with Steri-Strips in place some of them loosening up.  OT removed and reapplied 1 at distal palmar crease and 2 at proximal phalanges of the fourth digits.  Patient was fitted with a Tubigrip to wear during the day as well as nighttime with also fabricating a dorsal hand-based extension splint for 3rd through 5th digits to wear at nighttime.  Patient presented with decreased flexion of digits as well as extension of PIPs of 3rd through 5th.  Did not initiate scar massage yet.  Patient with increased edema, scar tissue limiting flexion and extension of digits and grip and prehension strength.  Patient can benefit from skilled OT services to decrease edema and scar tissue increase motion and strength to return to prior level of function.  PERFORMANCE DEFICITS: in functional skills including ADLs, IADLs, coordination, dexterity, edema, ROM, strength, pain, fascial restrictions, flexibility, decreased knowledge of precautions, decreased knowledge of use of DME, and UE functional use,  , and psychosocial skills including habits and routines  and behaviors.   IMPAIRMENTS: are limiting patient from ADLs, IADLs, play, leisure, and social participation.   COMORBIDITIES: has no other co-morbidities that affects occupational performance. Patient will benefit from skilled OT to address above impairments and improve overall function.  MODIFICATION OR ASSISTANCE TO COMPLETE EVALUATION: No modification of tasks or assist necessary to complete an evaluation.  OT OCCUPATIONAL PROFILE AND HISTORY: Problem focused assessment: Including review of records relating to presenting problem.  CLINICAL DECISION MAKING: LOW - limited treatment options, no task modification necessary  REHAB POTENTIAL: Good  EVALUATION COMPLEXITY: Low      PLAN:  OT FREQUENCY: 1-2x/week  OT DURATION: 8 weeks  PLANNED INTERVENTIONS: therapeutic exercise, manual therapy, scar mobilization, passive range of motion, splinting, paraffin, fluidotherapy, contrast bath, patient/family education, and DME and/or AE instructions, ADL's     CONSULTED AND AGREED WITH PLAN OF CARE:      Oletta Cohn, OTR/L,CLT 11/25/2022, 10:18 AM

## 2022-11-28 ENCOUNTER — Ambulatory Visit: Payer: Medicare Other | Admitting: Occupational Therapy

## 2022-11-29 ENCOUNTER — Ambulatory Visit: Payer: Medicare Other | Admitting: Occupational Therapy

## 2022-11-29 DIAGNOSIS — M25642 Stiffness of left hand, not elsewhere classified: Secondary | ICD-10-CM

## 2022-11-29 DIAGNOSIS — L905 Scar conditions and fibrosis of skin: Secondary | ICD-10-CM

## 2022-11-29 DIAGNOSIS — M6281 Muscle weakness (generalized): Secondary | ICD-10-CM

## 2022-11-29 DIAGNOSIS — R600 Localized edema: Secondary | ICD-10-CM

## 2022-11-29 NOTE — Therapy (Signed)
OUTPATIENT OCCUPATIONAL THERAPY ORTHO TREATMENT  Patient Name: [name] MRN: [id number] DOB:29-Oct-1945, 77 y.o., male Today's Date: 11/29/2022  PCP: Dr Sena Slate PROVIDER: Dr Alan Mulder PA  END OF SESSION:  OT End of Session - 11/29/22 1342     Visit Number 2    Number of Visits 12    Date for OT Re-Evaluation 01/20/23    OT Start Time 0802    OT Stop Time 0826    OT Time Calculation (min) 24 min    Activity Tolerance Patient tolerated treatment well    Behavior During Therapy [facility] for tasks assessed/performed             Past Medical History:  Diagnosis Date   Anemia    Ascending aorta dilatation (HCC) 08/08/2021   a.) TTE 08/08/2021: measured up to 4.0 cm   Beta thalassemia minor    CAD (coronary artery disease) 04/18/2015   a.) LHC 04/18/2015: 80% dLCx, 99% OM2, 70% D1-1, 99% D1-2, 60% mLAD, 70 m-dLAD, 75% dLAD, 100% pRCA -- CVTS consult; b.) s/p 4v CABG 04/21/2015   Dupuytren's contracture of both hands    a.) s/p release (LEFT) in ~2000   Dyspnea    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 Mod inf and anteroapical mixed perfusion defect concerning for ischemia   Laceration of left knee    LBBB (left bundle branch block)    Long-term use of aspirin therapy    NSTEMI (non-ST elevated myocardial infarction) (HCC) 04/16/2015   a.) troponins trended: 0.06 --> 0.35 --> 0.89 ng/L; b.) 80% dLCx, 99% OM2, 70% D1-1, 99% D1-2, 60% mLAD, 70 m-dLAD, 75% dLAD, 100% pRCA -- CVTS consult; c.) s/p 4v CABG 04/21/2015   Postoperative atrial fibrillation (HCC) 04/23/2015   a.) POD-2 following CABG -- OOB to BR when atrial arrythmia began (had RVR) --> started on IV amiodarone and converted to NSR   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   a.) LIMA-LAD, SVG-PD, SVG-diagonal, SVG-LCx    Stage 3a chronic kidney disease (CKD) (HCC)    Thrombocytopenia (HCC)    Type II diabetes mellitus (HCC)    Unstable angina (HCC)    Ventricular bigeminy    Past Surgical History:  Procedure Laterality Date   CARDIAC CATHETERIZATION N/A 04/18/2015   Procedure: Left Heart Cath and Coronary Angiography;  Surgeon: Alwyn Pea, MD;  Location: [facility] INVASIVE CV LAB;  Service: Cardiovascular;  Laterality: N/A;   CATARACT EXTRACTION W/ INTRAOCULAR LENS IMPLANT Right 07/14/2017   CATARACT EXTRACTION W/ INTRAOCULAR LENS IMPLANT Left 07/28/2017   COLONOSCOPY  2014   cleared for 10 yrs   CORONARY ARTERY BYPASS GRAFT N/A 04/21/2015   Procedure: CORONARY ARTERY BYPASS GRAFTING (CABG) X 4 UTILIZING THE LEFT INTERNAL MAMMARY ARTERY TO LAD, ENDOSCOPICALLY HARVESTED RIGHT SAPHENEOUS VEINS TO PD,DIAGONAL AND CIRCUMFLEX.;  Surgeon: Delight Ovens, MD;  Location: [facility] OR;  Service: Open Heart Surgery;  Laterality: N/A;   DUPUYTREN CONTRACTURE RELEASE Left ~ 2000   DUPUYTREN CONTRACTURE RELEASE Right 11/06/2022   Procedure: EXCISION OF DUPUYTREN'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 RING FINGERS;  Surgeon: Christena Flake, MD;  Location: [facility] ORS;  Service: Orthopedics;  Laterality: Right;   INCISION AND DRAINAGE Left 07/18/2020   Procedure: INCISION AND DRAINAGE LEFT KNEE;  Surgeon: Ross Marcus, MD;  Location: [facility] ORS;  Service: Orthopedics;  Laterality: Left;   KNEE ARTHROSCOPY Right ~ 1995   TEE WITHOUT CARDIOVERSION N/A 04/21/2015   Procedure: TRANSESOPHAGEAL ECHOCARDIOGRAM (TEE);  Surgeon: Delight Ovens, MD;  Location: [facility] OR;  Service: Open Heart Surgery;  Laterality: N/A;   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)    Hyperlipidemia    Laceration of left knee 07/18/2020   Knee laceration, left, initial encounter 07/18/2020   Hyperlipidemia  associated with type 2 diabetes mellitus (HCC) 05/13/2019   Erectile dysfunction 05/13/2019   Pyuria 01/30/2019   Stage 3 chronic kidney disease (HCC) 01/30/2019   Beta thalassemia minor 03/20/2016   Anemia 03/06/2016   CAD of autologous bypass graft 05/10/2015   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   Presence of aortocoronary bypass graft 04/21/2015   Thrombocytopenia (HCC) 04/20/2015   Coronary artery disease 04/18/2015   Unstable angina (HCC) 04/17/2015   Elevated troponin 04/17/2015   DM type 2 causing CKD stage 3 (HCC) 04/17/2015   Essential hypertension 04/17/2015    ONSET DATE: 11/06/22  REFERRING DIAG: Right hand Dupuytren's release third /fourth digits  THERAPY DIAG:  Scar condition and fibrosis of skin  Localized edema  Muscle weakness (generalized)  Stiffness of left hand, not elsewhere classified  Rationale for Evaluation and Treatment:   SUBJECTIVE:   SUBJECTIVE STATEMENT: Sorry I got confused with my appointment I thought it was today.  I kept everything on since last time.  Wash my hand once.  Sleeping with the splint.  Palm a little sore but doing okay.   Pt accompanied by: self  PERTINENT HISTORY: Ortho note 11/20/22  - [name] is a 77 y.o. who presents today status post excision of Dupuytren contracture of right long and right ring finger performed on 11/06/2022. Patient doing well. Neurologically he is the same as he was preoperatively. 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 Pain is controllable. Patient's only concern is that it does seem to be a lot more swollen than he anticipated.  Stitches removed Steri-Strips applied referred to OT   PRECAUTIONS: None      WEIGHT BEARING RESTRICTIONS: Steri-Strips still in place  PAIN:  Are you having pain?  2/10 in the R palm  FALLS: Has patient fallen in last 6 months? No  LIVING ENVIRONMENT: Lives with: lives with their family and lives with their spouse     PLOF: Had years ago  Dupuytren's release on the left hand that was successful-right hand got worse the last few years contracting  PATIENT GOALS: To maintain progress with surgery and extension to be able to put gloves on, put hand in pocket and  grasping objects better  NEXT MD VISIT: 16 Sept  OBJECTIVE:   HAND DOMINANCE: Right  A  UPPER EXTREMITY ROM:     Active ROM Right eval Left eval  Shoulder flexion    Shoulder abduction    Shoulder adduction    Shoulder extension    Shoulder internal rotation    Shoulder external rotation    Elbow flexion    Elbow extension    Wrist flexion 55   Wrist extension 55   Wrist ulnar deviation WNL   Wrist radial deviation WNL   Wrist pronation    Wrist supination    (Blank rows = not tested)  Active ROM Right eval Left eval R 11/29/22  Thumb MCP (0-60)     Thumb IP (0-80)     Thumb Radial abd/add (0-55) 50     Thumb Palmar abd/add (0-45) 55     Thumb Opposition to Small Finger      Index MCP (0-90) 65   75  Index PIP (0-100) 100   90  Index DIP (0-70)       Long MCP (0-90)  75   75  Long PIP (0-100)  90 ( -35 ext   90  (ext - 20  Long DIP (0-70)       Ring MCP (0-90)  80   70  Ring PIP (0-100) 90 (-40 ext   95 (ext -32  Ring DIP (0-70)       Little MCP (0-90)  80   80  Little PIP (0-100)  95 (-30 ext   90(ext -30  Little DIP (0-70)       (Blank rows = not tested)    EDEMA: Edema in hand - and all digits - tight feeling   COGNITION: Overall cognitive status: Within functional limits for tasks assessed    OBSERVATIONS: Patient arrive this date with silicone sleeve on fourth digit and only 1 small open area of 1 cm in palm with scab.  Patient report did not remove silicone sleeve at all since evaluation did wash his hand 1 time.  Patient sleeping with extension splint.  TODAY'S TREATMENT:                                                                                                                              DATE: 11/29/22: Patient  arrived this date after appointment was yesterday.  Patient seen for short session.  Patient to continue with same home program.  Tolerating well and showing progress in PIP extension as well as flexion.  Pain and discomfort mostly in the palm less than a 2/10. Upon arrival appear patient did not remove silicone sleeve on fourth digit since evaluation washing hand only 1 time.   Foul odor-took patient to wash hand under running water.   Patient fitted with new Tubigrip D to wear during the day Remove silicone sleeve not fitted again Tendon glides 10 reps blocking proximal distal palmar crease as well as proximal phalanges And composite fist to red roller-with active assisted range of motion for extension of digits in between Active range of motion for palmar radial abduction of the thumb Assess fitting and wearing off  dorsal hand-based extension splint for 3rd through 5th digits-patient educated on donning and doffing as well as wearing to maintain extension. Patient making good progress with extension and tolerance of extension splint.       11/25/22 Done with patient contrast and recommended to do 3 times a day prior to gentle rolling for digit extension and soft tissue massage over red roller 20 reps pain-free With Tubigrip D on Tendon  glides 10 reps blocking proximal distal palmar crease as well as proximal phalanges And composite fist to red roller-with active assisted range of motion for extension of digits in between Active range of motion for palmar radial abduction of the thumb Silicone compression sleeve for third digit on and off during the day and nighttime 3 times a day to do home exercises Fabricated patient dorsal hand-based extension splint for 3rd through 5th digits-patient educated on donning and doffing as well as wearing to maintain extension.   PATIENT EDUCATION: Education details: Findings of evaluation, home exercises and splint use Person educated:  Patient Education method: Explanation, Demonstration, Tactile cues, Verbal cues, and Handouts Education comprehension: verbalized understanding, returned demonstration, verbal cues required, tactile cues required, and needs further education    GOALS: Goals reviewed with patient? Yes  SHORT TERM GOALS: Target date: 1 wk   Patient to be independent and wearing of hand-based extension splint at nighttime to maintain extension of third and fourth digits Baseline: Patient arrived with Steri-Strips on focusing more on fisting and extension, -30 and -40 PIP extension at third and fourth right hand Goal status: INITIAL  LONG TERM GOALS: Target date: 8 weeks  Flexion of right digits improved for patient to touch palm to initiate use in ADLs with no increase symptoms Baseline: Steri-Strips removed today applied to more again-[facility] flexion 65 to 80 degrees, PIP 90 to 100 degrees-discomfort and pain 1-2/10 Goal status: INITIAL  2.  Patient maintain extension of right digits third and fourth better than -30 and-40 degrees to be able to don gloves, wash face and reach for cylinder objects without issues Baseline: Patient arrived with -30 to -40 PIP extension,  Goal status: INITIAL  3.  Scar tissue improved for patient to be able to make composite fist as well as maintain extension and tolerate using tools Baseline: Steri-Strips removed today; not initiating scar massage yet ;reapplied 2-3 Steri-Strips again; unable to make composite fist as well as extension at PIPs -30 to -40 degrees Goal status: INITIAL  4.  Right grip and prehension strength increased to more than 75% compared to the left for patient to initiate using hand with tools and yard work without increased issues. Baseline: Steri-Strips removed and reapplied to 3; flexion extension still limited, scar massage not initiated yet-patient 3 weeks postop Goal status: INITIAL  ASSESSMENT:  CLINICAL IMPRESSION: Patient present at  occupational therapy evaluation about 3 weeks postop Dupuytren's release on the right dominant hand for third and fourth digits.  At evaluation patient arrived with Steri-Strips in place some of them loosening up.  OT removed and reapplied 1 at distal palmar crease and 2 at proximal phalanges of the fourth digits.  Patient was fitted with a Tubigrip to wear during the day as well as nighttime with also fabricating a dorsal hand-based extension splint for 3rd through 5th digits to wear at nighttime.  Patient arrive today and was seen quickly because patient's appointment was yesterday.  Reviewed with patient again splint wearing at nighttime.  As well as home program.  Made great progress and extension of digits.  Appear patient did keep on silicone sleeve on fourth digit since evaluation. Foul odor -2 patient to wash hand under running water.  Not fitted with another one.  Pt cont to show decreased flexion of digits as well as extension of PIPs of 3rd through 5th.  Did not initiate scar massage yet.  Patient with increased edema, scar tissue limiting flexion and extension of digits and grip  and prehension strength.  Patient can benefit from skilled OT services to decrease edema and scar tissue increase motion and strength to return to prior level of function.  PERFORMANCE DEFICITS: in functional skills including ADLs, IADLs, coordination, dexterity, edema, ROM, strength, pain, fascial restrictions, flexibility, decreased knowledge of precautions, decreased knowledge of use of DME, and UE functional use,  , and psychosocial skills including habits and routines and behaviors.   IMPAIRMENTS: are limiting patient from ADLs, IADLs, play, leisure, and social participation.   COMORBIDITIES: has no other co-morbidities that affects occupational performance. Patient will benefit from skilled OT to address above impairments and improve overall function.  MODIFICATION OR ASSISTANCE TO COMPLETE EVALUATION: No  modification of tasks or assist necessary to complete an evaluation.  OT OCCUPATIONAL PROFILE AND HISTORY: Problem focused assessment: Including review of records relating to presenting problem.  CLINICAL DECISION MAKING: LOW - limited treatment options, no task modification necessary  REHAB POTENTIAL: Good  EVALUATION COMPLEXITY: Low      PLAN:  OT FREQUENCY: 1-2x/week  OT DURATION: 8 weeks  PLANNED INTERVENTIONS: therapeutic exercise, manual therapy, scar mobilization, passive range of motion, splinting, paraffin, fluidotherapy, contrast bath, patient/family education, and DME and/or AE instructions, ADL's     CONSULTED AND AGREED WITH PLAN OF CARE:      Oletta Cohn, OTR/L,CLT 11/29/2022, 1:44 PM

## 2022-12-03 ENCOUNTER — Ambulatory Visit: Payer: Medicare Other | Admitting: Occupational Therapy

## 2022-12-03 DIAGNOSIS — M25642 Stiffness of left hand, not elsewhere classified: Secondary | ICD-10-CM

## 2022-12-03 DIAGNOSIS — L905 Scar conditions and fibrosis of skin: Secondary | ICD-10-CM

## 2022-12-03 DIAGNOSIS — R600 Localized edema: Secondary | ICD-10-CM | POA: Diagnosis not present

## 2022-12-03 DIAGNOSIS — M6281 Muscle weakness (generalized): Secondary | ICD-10-CM | POA: Diagnosis not present

## 2022-12-06 ENCOUNTER — Encounter: Payer: Self-pay | Admitting: Occupational Therapy

## 2022-12-06 ENCOUNTER — Ambulatory Visit: Payer: Medicare Other | Admitting: Occupational Therapy

## 2022-12-06 DIAGNOSIS — R600 Localized edema: Secondary | ICD-10-CM | POA: Diagnosis not present

## 2022-12-06 DIAGNOSIS — M6281 Muscle weakness (generalized): Secondary | ICD-10-CM

## 2022-12-06 DIAGNOSIS — M25642 Stiffness of left hand, not elsewhere classified: Secondary | ICD-10-CM

## 2022-12-06 DIAGNOSIS — L905 Scar conditions and fibrosis of skin: Secondary | ICD-10-CM | POA: Diagnosis not present

## 2022-12-06 NOTE — Therapy (Signed)
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 Mod inf and anteroapical mixed perfusion defect concerning for ischemia   Laceration of left knee    LBBB (left bundle branch block)    Long-term use of aspirin therapy    NSTEMI (non-ST elevated myocardial infarction) (HCC) 04/16/2015   a.) troponins trended: 0.06 --> 0.35 --> 0.89 ng/L; b.) 80% dLCx, 99% OM2, 70% D1-1, 99% D1-2, 60% mLAD, 70 m-dLAD, 75% dLAD, 100% pRCA -- CVTS consult; c.) s/p 4v CABG 04/21/2015   Postoperative atrial fibrillation (HCC) 04/23/2015   a.) POD-2 following CABG -- OOB to BR when atrial arrythmia began (had RVR) --> started on IV amiodarone and converted to NSR   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   a.) LIMA-LAD, SVG-PD, SVG-diagonal, SVG-LCx   Stage 3a chronic kidney  disease (CKD) (HCC)    Thrombocytopenia (HCC)    Type II diabetes mellitus (HCC)    Unstable angina (HCC)    Ventricular bigeminy    Past Surgical History:  Procedure Laterality Date   CARDIAC CATHETERIZATION N/A 04/18/2015   Procedure: Left Heart Cath and Coronary Angiography;  Surgeon: Alwyn Pea, MD;  Location: [facility] INVASIVE CV LAB;  Service: Cardiovascular;  Laterality: N/A;   CATARACT EXTRACTION W/ INTRAOCULAR LENS IMPLANT Right 07/14/2017   CATARACT EXTRACTION W/ INTRAOCULAR LENS IMPLANT Left 07/28/2017   COLONOSCOPY  2014   cleared for 10 yrs   CORONARY ARTERY BYPASS GRAFT N/A 04/21/2015   Procedure: CORONARY ARTERY BYPASS GRAFTING (CABG) X 4 UTILIZING THE LEFT INTERNAL MAMMARY ARTERY TO LAD, ENDOSCOPICALLY HARVESTED RIGHT SAPHENEOUS VEINS TO PD,DIAGONAL AND CIRCUMFLEX.;  Surgeon: Delight Ovens, MD;  Location: [facility] OR;  Service: Open Heart Surgery;  Laterality: N/A;   DUPUYTREN CONTRACTURE RELEASE Left ~ 2000   DUPUYTREN CONTRACTURE RELEASE Right 11/06/2022   Procedure: EXCISION OF DUPUYTREN'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 RING FINGERS;  Surgeon: Christena Flake, MD;  Location: [facility] ORS;  Service: Orthopedics;  Laterality: Right;   INCISION AND DRAINAGE Left 07/18/2020   Procedure: INCISION AND DRAINAGE LEFT KNEE;  Surgeon: Ross Marcus, MD;  Location: [facility]  ORS;  Service: Orthopedics;  Laterality: Left;   KNEE ARTHROSCOPY Right ~ 1995   TEE WITHOUT CARDIOVERSION N/A 04/21/2015   Procedure: TRANSESOPHAGEAL ECHOCARDIOGRAM (TEE);  Surgeon: Delight Ovens, MD;  Location: [facility] OR;  Service: Open Heart Surgery;  Laterality: N/A;   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)    Hyperlipidemia    Laceration of left knee 07/18/2020   Knee laceration, left, initial encounter 07/18/2020   Hyperlipidemia associated with type 2  diabetes mellitus (HCC) 05/13/2019   Erectile dysfunction 05/13/2019   Pyuria 01/30/2019   Stage 3 chronic kidney disease (HCC) 01/30/2019   Beta thalassemia minor 03/20/2016   Anemia 03/06/2016   CAD of autologous bypass graft 05/10/2015   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   Presence of aortocoronary bypass graft 04/21/2015   Thrombocytopenia (HCC) 04/20/2015   Coronary artery disease 04/18/2015   Unstable angina (HCC) 04/17/2015   Elevated troponin 04/17/2015   DM type 2 causing CKD stage 3 (HCC) 04/17/2015   Essential hypertension 04/17/2015    ONSET DATE: 11/06/22  REFERRING DIAG: Right hand Dupuytren's release third /fourth digits  THERAPY DIAG:  Scar condition and fibrosis of skin  Localized edema  Muscle weakness (generalized)  Stiffness of left hand, not elsewhere classified  Rationale for Evaluation and Treatment:   SUBJECTIVE:   SUBJECTIVE STATEMENT: No pain, just stiffness, has been using red foam roller, exercises, wearing splint at night, no problems with splint and tolerating it well.   Pt accompanied by: self  PERTINENT HISTORY: Ortho note 11/20/22  - Stephens Vanrooyen is a 32 y.o. who presents today status post excision of Dupuytren contracture of right long and right ring finger performed on 11/06/2022. Patient doing well. Neurologically he is the same as he was preoperatively. 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 Pain is controllable. Patient's only concern is that it does seem to be a lot more swollen than he anticipated.  Stitches removed Steri-Strips applied referred to OT   PRECAUTIONS: None  WEIGHT BEARING RESTRICTIONS: Steri-Strips still in place  PAIN:  Are you having pain?  2/10 in the R palm  FALLS: Has patient fallen in last 6 months? No  LIVING ENVIRONMENT: Lives with: lives with their family and lives with their spouse  PLOF: Had years ago Dupuytren's release on the left hand that was successful-right hand got worse the  last few years contracting  PATIENT GOALS: To maintain progress with surgery and extension to be able to put gloves on, put hand in pocket and  grasping objects better  NEXT MD VISIT: 16 Sept  OBJECTIVE:   HAND DOMINANCE: Right  A  UPPER EXTREMITY ROM:     Active ROM Right eval Left eval  Shoulder flexion    Shoulder abduction    Shoulder adduction    Shoulder extension    Shoulder internal rotation    Shoulder external rotation    Elbow flexion    Elbow extension    Wrist flexion 55   Wrist extension 55   Wrist ulnar deviation WNL   Wrist radial deviation WNL   Wrist pronation    Wrist supination    (Blank rows = not tested)  Active ROM Right eval Left eval R 11/29/22 12/06/22 R  Thumb MCP (0-60)      Thumb IP (0-80)  Thumb Radial abd/add (0-55) 50      Thumb Palmar abd/add (0-45) 55      Thumb Opposition to Small Finger       Index MCP (0-90) 65   75 0-75  Index PIP (0-100) 100   90 0-90  Index DIP (0-70)        Long MCP (0-90)  75   75 75  Long PIP (0-100)  90 ( -35 ext   90  (ext - 20 90 Ext -15  Long DIP (0-70)        Ring MCP (0-90)  80   70 80  Ring PIP (0-100) 90 (-40 ext   95 (ext -32 95 -25 ext  Ring DIP (0-70)        Little MCP (0-90)  80   80 76  Little PIP (0-100)  95 (-30 ext   90(ext -30 90 -35 ext  Little DIP (0-70)        (Blank rows = not tested)    EDEMA: Edema in hand - and all digits - tight feeling   COGNITION: Overall cognitive status: Within functional limits for tasks assessed  TODAY'S TREATMENT:                                                                                                                              DATE: 12/06/22:  Contrast:  Use of contrast to right hand for 11 mins to decrease edema and decrease pain and promote increased ROM  Mild scabbing to distal palm area, cleaned area and debrided some dead skin from palm, healing well  Manual Therapy:  Following contrast, Red foam rolling on tabletop  which also helps to massage scar area and loosen tissue in palm.  Carpal and metacarpal spreads to promote help open area and promote lymphatic flow to decrease edema in hand.  Scar massage performed by therapist to proximal scar area which has no scabs and healed, no massage to area with residual scabbing.    Therapeutic Exercises:  Tendon glides 10 reps blocking proximal distal palmar crease as well as proximal phalanges Composite fist to red roller-with active assisted range of motion for extension of digits in between each repetition Active range of motion for palmar radial abduction of the thumb Pressing against foam into table for finger extension for 5 reps for 2 sets Small piece of 1" square foam for opposition of thumb to each digit, in alternating fashion AAROM with passive end range stretch for finger extension each digit. Red foam rolling again at the end of session with cues for finger extension while rolling  PATIENT EDUCATION: Education details: Findings of evaluation, home exercises and splint use Person educated: Patient Education method: Explanation, Demonstration, Tactile cues, Verbal cues, and Handouts Education comprehension: verbalized understanding, returned demonstration, verbal cues required, tactile cues required, and needs further education GOALS: Goals reviewed with patient? Yes  SHORT TERM GOALS: Target date: 1 wk   Patient  to be independent and wearing of hand-based extension splint at nighttime to maintain extension of third and fourth digits Baseline: Patient arrived with Steri-Strips on focusing more on fisting and extension, -30 and -40 PIP extension at third and fourth right hand Goal status: INITIAL  LONG TERM GOALS: Target date: 8 weeks  Flexion of right digits improved for patient to touch palm to initiate use in ADLs with no increase symptoms Baseline: Steri-Strips removed today applied to more again-[facility] flexion 65 to 80 degrees, PIP 90 to 100  degrees-discomfort and pain 1-2/10 Goal status: INITIAL  2.  Patient maintain extension of right digits third and fourth better than -30 and-40 degrees to be able to don gloves, wash face and reach for cylinder objects without issues Baseline: Patient arrived with -30 to -40 PIP extension,  Goal status: INITIAL  3.  Scar tissue improved for patient to be able to make composite fist as well as maintain extension and tolerate using tools Baseline: Steri-Strips removed today; not initiating scar massage yet ;reapplied 2-3 Steri-Strips again; unable to make composite fist as well as extension at PIPs -30 to -40 degrees Goal status: INITIAL  4.  Right grip and prehension strength increased to more than 75% compared to the left for patient to initiate using hand with tools and yard work without increased issues. Baseline: Steri-Strips removed and reapplied to 3; flexion extension still limited, scar massage not initiated yet-patient 3 weeks postop Goal status: INITIAL  ASSESSMENT:  CLINICAL IMPRESSION: Patient presented at occupational therapy evaluation about 3 weeks postop Dupuytren's release on the right dominant hand for third and fourth digits.  At evaluation patient arrived with Steri-Strips in place some of them loosening up.  OT removed and reapplied 1 at distal palmar crease and 2 at proximal phalanges of the fourth digits.  Patient was fitted with a Tubigrip to wear during the day as well as nighttime with also fabricating a dorsal hand-based extension splint for 3rd through 5th digits to wear at nighttime. Pt continues to make great progress, but continues to demonstrate limitations in flexion of digits as well as extension of PIPs of 3rd through 5th.  Pt still with an area of scabbing in distal palm and ring finger, scar massage performed at proximal end of scar which is closed and healing.  Pt will start to perform scar massage in this area at home and advance to areas once areas are closed.   Pt able to recall exercises but also has his HEP sheet to refer to when needed.  Improvements noted in ROM especially with finger extension this date with measurements.  Patient with increased edema, scar tissue limiting flexion and extension of digits and grip and prehension strength.  Patient can benefit from skilled OT services to decrease edema and scar tissue increase motion and strength to return to prior level of function.  PERFORMANCE DEFICITS: in functional skills including ADLs, IADLs, coordination, dexterity, edema, ROM, strength, pain, fascial restrictions, flexibility, decreased knowledge of precautions, decreased knowledge of use of DME, and UE functional use,  , and psychosocial skills including habits and routines and behaviors.   IMPAIRMENTS: are limiting patient from ADLs, IADLs, play, leisure, and social participation.   COMORBIDITIES: has no other co-morbidities that affects occupational performance. Patient will benefit from skilled OT to address above impairments and improve overall function.  MODIFICATION OR ASSISTANCE TO COMPLETE EVALUATION: No modification of tasks or assist necessary to complete an evaluation.  OT OCCUPATIONAL PROFILE AND HISTORY: Problem focused assessment:  Including review of records relating to presenting problem.  CLINICAL DECISION MAKING: LOW - limited treatment options, no task modification necessary  REHAB POTENTIAL: Good  EVALUATION COMPLEXITY: Low   PLAN:  OT FREQUENCY: 1-2x/week  OT DURATION: 8 weeks  PLANNED INTERVENTIONS: therapeutic exercise, manual therapy, scar mobilization, passive range of motion, splinting, paraffin, fluidotherapy, contrast bath, patient/family education, and DME and/or AE instructions, ADL's   CONSULTED AND AGREED WITH PLAN OF CARE: Patient  Franko Hilliker, OTR/L,CLT 12/06/2022, 3:40 PM

## 2022-12-06 NOTE — Therapy (Addendum)
OUTPATIENT OCCUPATIONAL THERAPY ORTHO TREATMENT  Patient Name: [name] MRN: [id number] DOB:Jun 22, 1945, 77 y.o., male  PCP: Dr Sena Slate PROVIDER: Dr Alan Mulder PA  END OF SESSION:  OT End of Session - 12/06/22 1522     Visit Number 3    Number of Visits 12    Date for OT Re-Evaluation 01/20/23    OT Start Time 0815    OT Stop Time 0900    OT Time Calculation (min) 45 min    Activity Tolerance Patient tolerated treatment well    Behavior During Therapy [facility] for tasks assessed/performed             Past Medical History:  Diagnosis Date   Anemia    Ascending aorta dilatation (HCC) 08/08/2021   a.) TTE 08/08/2021: measured up to 4.0 cm   Beta thalassemia minor    CAD (coronary artery disease) 04/18/2015   a.) LHC 04/18/2015: 80% dLCx, 99% OM2, 70% D1-1, 99% D1-2, 60% mLAD, 70 m-dLAD, 75% dLAD, 100% pRCA -- CVTS consult; b.) s/p 4v CABG 04/21/2015   Dupuytren's contracture of both hands    a.) s/p release (LEFT) in ~2000   Dyspnea    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 Mod inf and anteroapical mixed perfusion defect concerning for ischemia   Laceration of left knee    LBBB (left bundle branch block)    Long-term use of aspirin therapy    NSTEMI (non-ST elevated myocardial infarction) (HCC) 04/16/2015   a.) troponins trended: 0.06 --> 0.35 --> 0.89 ng/L; b.) 80% dLCx, 99% OM2, 70% D1-1, 99% D1-2, 60% mLAD, 70 m-dLAD, 75% dLAD, 100% pRCA -- CVTS consult; c.) s/p 4v CABG 04/21/2015   Postoperative atrial fibrillation (HCC) 04/23/2015   a.) POD-2 following CABG -- OOB to BR when atrial arrythmia began (had RVR) --> started on IV amiodarone and converted to NSR   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   a.) LIMA-LAD, SVG-PD, SVG-diagonal, SVG-LCx   Stage 3a chronic kidney  disease (CKD) (HCC)    Thrombocytopenia (HCC)    Type II diabetes mellitus (HCC)    Unstable angina (HCC)    Ventricular bigeminy    Past Surgical History:  Procedure Laterality Date   CARDIAC CATHETERIZATION N/A 04/18/2015   Procedure: Left Heart Cath and Coronary Angiography;  Surgeon: Alwyn Pea, MD;  Location: [facility] INVASIVE CV LAB;  Service: Cardiovascular;  Laterality: N/A;   CATARACT EXTRACTION W/ INTRAOCULAR LENS IMPLANT Right 07/14/2017   CATARACT EXTRACTION W/ INTRAOCULAR LENS IMPLANT Left 07/28/2017   COLONOSCOPY  2014   cleared for 10 yrs   CORONARY ARTERY BYPASS GRAFT N/A 04/21/2015   Procedure: CORONARY ARTERY BYPASS GRAFTING (CABG) X 4 UTILIZING THE LEFT INTERNAL MAMMARY ARTERY TO LAD, ENDOSCOPICALLY HARVESTED RIGHT SAPHENEOUS VEINS TO PD,DIAGONAL AND CIRCUMFLEX.;  Surgeon: Delight Ovens, MD;  Location: [facility] OR;  Service: Open Heart Surgery;  Laterality: N/A;   DUPUYTREN CONTRACTURE RELEASE Left ~ 2000   DUPUYTREN CONTRACTURE RELEASE Right 11/06/2022   Procedure: EXCISION OF DUPUYTREN'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 RING FINGERS;  Surgeon: Christena Flake, MD;  Location: [facility] ORS;  Service: Orthopedics;  Laterality: Right;   INCISION AND DRAINAGE Left 07/18/2020   Procedure: INCISION AND DRAINAGE LEFT KNEE;  Surgeon: Ross Marcus, MD;  Location: [facility]  ORS;  Service: Orthopedics;  Laterality: Left;   KNEE ARTHROSCOPY Right ~ 1995   TEE WITHOUT CARDIOVERSION N/A 04/21/2015   Procedure: TRANSESOPHAGEAL ECHOCARDIOGRAM (TEE);  Surgeon: Delight Ovens, MD;  Location: [facility] OR;  Service: Open Heart Surgery;  Laterality: N/A;   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)    Hyperlipidemia    Laceration of left knee 07/18/2020   Knee laceration, left, initial encounter 07/18/2020   Hyperlipidemia associated with type 2  diabetes mellitus (HCC) 05/13/2019   Erectile dysfunction 05/13/2019   Pyuria 01/30/2019   Stage 3 chronic kidney disease (HCC) 01/30/2019   Beta thalassemia minor 03/20/2016   Anemia 03/06/2016   CAD of autologous bypass graft 05/10/2015   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   Presence of aortocoronary bypass graft 04/21/2015   Thrombocytopenia (HCC) 04/20/2015   Coronary artery disease 04/18/2015   Unstable angina (HCC) 04/17/2015   Elevated troponin 04/17/2015   DM type 2 causing CKD stage 3 (HCC) 04/17/2015   Essential hypertension 04/17/2015    ONSET DATE: 11/06/22  REFERRING DIAG: Right hand Dupuytren's release third /fourth digits  THERAPY DIAG:  Scar condition and fibrosis of skin  Localized edema  Muscle weakness (generalized)  Stiffness of left hand, not elsewhere classified  Rationale for Evaluation and Treatment:   SUBJECTIVE:   SUBJECTIVE STATEMENT: No pain in right hand, "I am aware of it but it" but denies pain.  Reports he has been doing contrast at home and feels the swelling is starting to come down a bit.  Doing exercises 2-3 times a day and "stretches all during the day".  Pt accompanied by: self  PERTINENT HISTORY: Ortho note 11/20/22  - [name] is a 77 y.o. who presents today status post excision of Dupuytren contracture of right long and right ring finger performed on 11/06/2022. Patient doing well. Neurologically he is the same as he was preoperatively. 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 Pain is controllable. Patient's only concern is that it does seem to be a lot more swollen than he anticipated.  Stitches removed Steri-Strips applied referred to OT   PRECAUTIONS: None  WEIGHT BEARING RESTRICTIONS: Steri-Strips still in place  PAIN:  Are you having pain?  2/10 in the R palm  FALLS: Has patient fallen in last 6 months? No  LIVING ENVIRONMENT: Lives with: lives with their family and lives with their spouse  PLOF: Had  years ago Dupuytren's release on the left hand that was successful-right hand got worse the last few years contracting  PATIENT GOALS: To maintain progress with surgery and extension to be able to put gloves on, put hand in pocket and  grasping objects better  NEXT MD VISIT: 16 Sept  OBJECTIVE:   HAND DOMINANCE: Right  A  UPPER EXTREMITY ROM:     Active ROM Right eval Left eval  Shoulder flexion    Shoulder abduction    Shoulder adduction    Shoulder extension    Shoulder internal rotation    Shoulder external rotation    Elbow flexion    Elbow extension    Wrist flexion 55   Wrist extension 55   Wrist ulnar deviation WNL   Wrist radial deviation WNL   Wrist pronation    Wrist supination    (Blank rows = not tested)  Active ROM Right eval Left eval R 11/29/22  Thumb MCP (0-60)     Thumb IP (0-80)     Thumb Radial abd/add (0-55) 50     Thumb Palmar abd/add (0-45) 55     Thumb Opposition to Small Finger      Index MCP (0-90) 65   75  Index PIP (0-100) 100   90  Index DIP (0-70)       Long MCP (0-90)  75   75  Long PIP (0-100)  90 ( -35 ext   90  (ext - 20  Long DIP (0-70)       Ring MCP (0-90)  80   70  Ring PIP (0-100) 90 (-40 ext   95 (ext -32  Ring DIP (0-70)       Little MCP (0-90)  80   80  Little PIP (0-100)  95 (-30 ext   90(ext -30  Little DIP (0-70)       (Blank rows = not tested)    EDEMA: Edema in hand - and all digits - tight feeling   COGNITION: Overall cognitive status: Within functional limits for tasks assessed    OBSERVATIONS: Patient arrive this date with silicone sleeve on fourth digit and only 1 small open area of 1 cm in palm with scab.  Patient report did not remove silicone sleeve at all since evaluation did wash his hand 1 time.  Patient sleeping with extension splint.  TODAY'S TREATMENT:                                                                                                                              DATE:  12/03/22:  Contrast:  Use of contrast to right hand for 10 mins to decrease edema and decrease pain and promote increased ROM  Pt with some scabbing still to palm area, cleaned area and debrided some dead skin from palm Manual Therapy:  Following contrast, Scar massage performed by therapist to proximal scar area which has no scabs and healed, no massage to area with residual scabbing.   Red foam rolling on tabletop which also helps to massage scar area and loosen tissue in palm.  Carpal and metacarpal spreads to promote help open area and promote lymphatic flow to decrease edema in hand.    Therapeutic Exercises:  Tendon glides 10 reps blocking proximal distal palmar crease as well as proximal phalanges Composite fist to red roller-with active assisted range of motion for extension of digits in between each repetition Active range of motion for palmar radial abduction of the thumb Red foam rolling again at the end of session with cues for finger extension while rolling  PATIENT EDUCATION: Education details: Findings of evaluation, home exercises and splint use Person educated: Patient Education method: Explanation, Demonstration, Tactile cues, Verbal cues, and Handouts Education comprehension: verbalized understanding, returned demonstration, verbal cues required, tactile cues required, and needs further education GOALS: Goals reviewed with patient?  Yes  SHORT TERM GOALS: Target date: 1 wk   Patient to be independent and wearing of hand-based extension splint at nighttime to maintain extension of third and fourth digits Baseline: Patient arrived with Steri-Strips on focusing more on fisting and extension, -30 and -40 PIP extension at third and fourth right hand Goal status: INITIAL  LONG TERM GOALS: Target date: 8 weeks  Flexion of right digits improved for patient to touch palm to initiate use in ADLs with no increase symptoms Baseline: Steri-Strips removed today applied to more  again-MC flexion 65 to 80 degrees, PIP 90 to 100 degrees-discomfort and pain 1-2/10 Goal status: INITIAL  2.  Patient maintain extension of right digits third and fourth better than -30 and-40 degrees to be able to don gloves, wash face and reach for cylinder objects without issues Baseline: Patient arrived with -30 to -40 PIP extension,  Goal status: INITIAL  3.  Scar tissue improved for patient to be able to make composite fist as well as maintain extension and tolerate using tools Baseline: Steri-Strips removed today; not initiating scar massage yet ;reapplied 2-3 Steri-Strips again; unable to make composite fist as well as extension at PIPs -30 to -40 degrees Goal status: INITIAL  4.  Right grip and prehension strength increased to more than 75% compared to the left for patient to initiate using hand with tools and yard work without increased issues. Baseline: Steri-Strips removed and reapplied to 3; flexion extension still limited, scar massage not initiated yet-patient 3 weeks postop Goal status: INITIAL  ASSESSMENT:  CLINICAL IMPRESSION: Patient presented at occupational therapy evaluation about 3 weeks postop Dupuytren's release on the right dominant hand for third and fourth digits.  At evaluation patient arrived with Steri-Strips in place some of them loosening up.  OT removed and reapplied 1 at distal palmar crease and 2 at proximal phalanges of the fourth digits.  Patient was fitted with a Tubigrip to wear during the day as well as nighttime with also fabricating a dorsal hand-based extension splint for 3rd through 5th digits to wear at nighttime. Pt continues to make great progress, but continues to demonstrate limitations in flexion of digits as well as extension of PIPs of 3rd through 5th.  Pt still with an area of scabbing in distal palm and ring finger, scar massage initiated at proximal end of scar which is closed and healing.  Pt becoming more familiar with exercises and benefits  from cues for proper form and technique.  Patient with increased edema, scar tissue limiting flexion and extension of digits and grip and prehension strength.  Patient can benefit from skilled OT services to decrease edema and scar tissue increase motion and strength to return to prior level of function.  PERFORMANCE DEFICITS: in functional skills including ADLs, IADLs, coordination, dexterity, edema, ROM, strength, pain, fascial restrictions, flexibility, decreased knowledge of precautions, decreased knowledge of use of DME, and UE functional use,  , and psychosocial skills including habits and routines and behaviors.   IMPAIRMENTS: are limiting patient from ADLs, IADLs, play, leisure, and social participation.   COMORBIDITIES: has no other co-morbidities that affects occupational performance. Patient will benefit from skilled OT to address above impairments and improve overall function.  MODIFICATION OR ASSISTANCE TO COMPLETE EVALUATION: No modification of tasks or assist necessary to complete an evaluation.  OT OCCUPATIONAL PROFILE AND HISTORY: Problem focused assessment: Including review of records relating to presenting problem.  CLINICAL DECISION MAKING: LOW - limited treatment options, no task modification necessary  REHAB  POTENTIAL: Good  EVALUATION COMPLEXITY: Low   PLAN:  OT FREQUENCY: 1-2x/week  OT DURATION: 8 weeks  PLANNED INTERVENTIONS: therapeutic exercise, manual therapy, scar mobilization, passive range of motion, splinting, paraffin, fluidotherapy, contrast bath, patient/family education, and DME and/or AE instructions, ADL's   CONSULTED AND AGREED WITH PLAN OF CARE: Patient  Okie Jansson, OTR/L,CLT 12/06/2022, 3:54 PM

## 2022-12-10 ENCOUNTER — Ambulatory Visit: Payer: Medicare Other | Attending: Physician Assistant | Admitting: Occupational Therapy

## 2022-12-10 DIAGNOSIS — M6281 Muscle weakness (generalized): Secondary | ICD-10-CM | POA: Diagnosis not present

## 2022-12-10 DIAGNOSIS — R600 Localized edema: Secondary | ICD-10-CM | POA: Diagnosis not present

## 2022-12-10 DIAGNOSIS — M25642 Stiffness of left hand, not elsewhere classified: Secondary | ICD-10-CM | POA: Insufficient documentation

## 2022-12-10 DIAGNOSIS — L905 Scar conditions and fibrosis of skin: Secondary | ICD-10-CM | POA: Diagnosis not present

## 2022-12-11 DIAGNOSIS — D631 Anemia in chronic kidney disease: Secondary | ICD-10-CM | POA: Diagnosis not present

## 2022-12-11 DIAGNOSIS — N1831 Chronic kidney disease, stage 3a: Secondary | ICD-10-CM | POA: Diagnosis not present

## 2022-12-11 DIAGNOSIS — E1122 Type 2 diabetes mellitus with diabetic chronic kidney disease: Secondary | ICD-10-CM | POA: Diagnosis not present

## 2022-12-11 DIAGNOSIS — N1832 Chronic kidney disease, stage 3b: Secondary | ICD-10-CM | POA: Diagnosis not present

## 2022-12-11 DIAGNOSIS — R809 Proteinuria, unspecified: Secondary | ICD-10-CM | POA: Diagnosis not present

## 2022-12-11 DIAGNOSIS — I10 Essential (primary) hypertension: Secondary | ICD-10-CM | POA: Diagnosis not present

## 2022-12-12 ENCOUNTER — Ambulatory Visit: Payer: Medicare Other | Admitting: Occupational Therapy

## 2022-12-12 ENCOUNTER — Encounter: Payer: Self-pay | Admitting: Occupational Therapy

## 2022-12-12 DIAGNOSIS — M25642 Stiffness of left hand, not elsewhere classified: Secondary | ICD-10-CM | POA: Diagnosis not present

## 2022-12-12 DIAGNOSIS — M6281 Muscle weakness (generalized): Secondary | ICD-10-CM | POA: Diagnosis not present

## 2022-12-12 DIAGNOSIS — R600 Localized edema: Secondary | ICD-10-CM | POA: Diagnosis not present

## 2022-12-12 DIAGNOSIS — L905 Scar conditions and fibrosis of skin: Secondary | ICD-10-CM

## 2022-12-12 NOTE — Therapy (Signed)
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 Mod inf and anteroapical mixed perfusion defect concerning for ischemia   Laceration of left knee    LBBB (left bundle branch block)    Long-term use of aspirin therapy    NSTEMI (non-ST elevated myocardial infarction) (HCC) 04/16/2015   a.) troponins trended: 0.06 --> 0.35 --> 0.89 ng/L; b.) 80% dLCx, 99% OM2, 70% D1-1, 99% D1-2, 60% mLAD, 70 m-dLAD, 75% dLAD, 100% pRCA -- CVTS consult; c.) s/p 4v CABG 04/21/2015   Postoperative atrial fibrillation (HCC) 04/23/2015   a.) POD-2 following CABG -- OOB to BR when atrial arrythmia began (had RVR) --> started on IV amiodarone and converted to NSR   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   a.) LIMA-LAD, SVG-PD, SVG-diagonal, SVG-LCx   Stage 3a chronic kidney  disease (CKD) (HCC)    Thrombocytopenia (HCC)    Type II diabetes mellitus (HCC)    Unstable angina (HCC)    Ventricular bigeminy    Past Surgical History:  Procedure Laterality Date   CARDIAC CATHETERIZATION N/A 04/18/2015   Procedure: Left Heart Cath and Coronary Angiography;  Surgeon: Alwyn Pea, MD;  Location: [facility] INVASIVE CV LAB;  Service: Cardiovascular;  Laterality: N/A;   CATARACT EXTRACTION W/ INTRAOCULAR LENS IMPLANT Right 07/14/2017   CATARACT EXTRACTION W/ INTRAOCULAR LENS IMPLANT Left 07/28/2017   COLONOSCOPY  2014   cleared for 10 yrs   CORONARY ARTERY BYPASS GRAFT N/A 04/21/2015   Procedure: CORONARY ARTERY BYPASS GRAFTING (CABG) X 4 UTILIZING THE LEFT INTERNAL MAMMARY ARTERY TO LAD, ENDOSCOPICALLY HARVESTED RIGHT SAPHENEOUS VEINS TO PD,DIAGONAL AND CIRCUMFLEX.;  Surgeon: Delight Ovens, MD;  Location: [facility] OR;  Service: Open Heart Surgery;  Laterality: N/A;   DUPUYTREN CONTRACTURE RELEASE Left ~ 2000   DUPUYTREN CONTRACTURE RELEASE Right 11/06/2022   Procedure: EXCISION OF DUPUYTREN'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 RING FINGERS;  Surgeon: Christena Flake, MD;  Location: [facility] ORS;  Service: Orthopedics;  Laterality: Right;   INCISION AND DRAINAGE Left 07/18/2020   Procedure: INCISION AND DRAINAGE LEFT KNEE;  Surgeon: Ross Marcus, MD;  Location: [facility]  ORS;  Service: Orthopedics;  Laterality: Left;   KNEE ARTHROSCOPY Right ~ 1995   TEE WITHOUT CARDIOVERSION N/A 04/21/2015   Procedure: TRANSESOPHAGEAL ECHOCARDIOGRAM (TEE);  Surgeon: Delight Ovens, MD;  Location: [facility] OR;  Service: Open Heart Surgery;  Laterality: N/A;   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)    Hyperlipidemia    Laceration of left knee 07/18/2020   Knee laceration, left, initial encounter 07/18/2020   Hyperlipidemia associated with type 2  diabetes mellitus (HCC) 05/13/2019   Erectile dysfunction 05/13/2019   Pyuria 01/30/2019   Stage 3 chronic kidney disease (HCC) 01/30/2019   Beta thalassemia minor 03/20/2016   Anemia 03/06/2016   CAD of autologous bypass graft 05/10/2015   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   Presence of aortocoronary bypass graft 04/21/2015   Thrombocytopenia (HCC) 04/20/2015   Coronary artery disease 04/18/2015   Unstable angina (HCC) 04/17/2015   Elevated troponin 04/17/2015   DM type 2 causing CKD stage 3 (HCC) 04/17/2015   Essential hypertension 04/17/2015    ONSET DATE: 11/06/22  REFERRING DIAG: Right hand Dupuytren's release third /fourth digits  THERAPY DIAG:  Scar condition and fibrosis of skin  Localized edema  Muscle weakness (generalized)  Stiffness of left hand, not elsewhere classified  Rationale for Evaluation and Treatment:   SUBJECTIVE:   SUBJECTIVE STATEMENT: Pt coughing this date and reports he hasn't felt well, slept a lot yesterday.  Reminded patient if he didn't feel well next time he can cancel his appt and reschedule when he is feeling better.    Pt accompanied by: self  PERTINENT HISTORY: Ortho note 11/20/22  - [name] is a 77 y.o. who presents today status post excision of Dupuytren contracture of right long and right ring finger performed on 11/06/2022. Patient doing well. Neurologically he is the same as he was preoperatively. 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 Pain is controllable. Patient's only concern is that it does seem to be a lot more swollen than he anticipated.  Stitches removed Steri-Strips applied referred to OT   PRECAUTIONS: None  WEIGHT BEARING RESTRICTIONS: Steri-Strips still in place  PAIN:  Are you having pain?  2/10 in the R palm  FALLS: Has patient fallen in last 6 months? No  LIVING ENVIRONMENT: Lives with: lives with their family and lives with their spouse  PLOF: Had years ago Dupuytren's release on the  left hand that was successful-right hand got worse the last few years contracting  PATIENT GOALS: To maintain progress with surgery and extension to be able to put gloves on, put hand in pocket and  grasping objects better  NEXT MD VISIT: 16 Sept  OBJECTIVE:   HAND DOMINANCE: Right  A  UPPER EXTREMITY ROM:     Active ROM Right eval Left eval  Shoulder flexion    Shoulder abduction    Shoulder adduction    Shoulder extension    Shoulder internal rotation    Shoulder external rotation    Elbow flexion    Elbow extension    Wrist flexion 55 55  Wrist extension 55 40  Wrist ulnar deviation WNL   Wrist radial deviation WNL   Wrist pronation    Wrist supination    (Blank rows = not tested)  Active ROM Right eval Left eval R 11/29/22 12/06/22  R 12/10/22 R  Thumb MCP (0-60)       Thumb IP (0-80)       Thumb Radial abd/add (0-55) 50     55  Thumb Palmar abd/add (0-45) 55     40  Thumb Opposition to Small Finger        Index MCP (0-90) 65   75 0-75   Index PIP (0-100) 100   90 0-90   Index DIP (0-70)         Long MCP (0-90)  75   75 75 75  Long PIP (0-100)  90 ( -35 ext   90  (ext - 20 90 Ext -15 90 Ext -20  Long DIP (0-70)         Ring MCP (0-90)  80   70 80 80  Ring PIP (0-100) 90 (-40 ext   95 (ext -32 95 -25 ext 95 Ext -30  Ring DIP (0-70)         Little MCP (0-90)  80   80 76 78  Little PIP (0-100)  95 (-30 ext   90(ext -30 90 -35 ext 90  ext -35  Little DIP (0-70)         (Blank rows = not tested)    EDEMA: Edema in hand - and all digits - tight feeling   COGNITION: Overall cognitive status: Within functional limits for tasks assessed  TODAY'S TREATMENT:                                                                                                                              DATE: 12/10/22:  Contrast:  Use of contrast to right hand for 8 mins to decrease edema and decrease pain and promote increased ROM  Continues to have some mild scabbing to  distal palm area, cleaned area and debrided some dead skin from palm,  continues to be healing well  Manual Therapy:  Following contrast, Red foam rolling on tabletop which also helps to massage scar area and loosen tissue in palm.  Carpal and metacarpal spreads to promote help open area and promote lymphatic flow to decrease edema in hand.  Scar massage performed by therapist as well as use of mini massager over scar to decrease adhesions and promote increased tissue mobility.      Therapeutic Exercises:  Reassessment of measurements this date, refer to flow sheet for details.  Tendon glides 10 reps blocking proximal distal palmar crease as well as proximal phalanges Composite fist to red roller-with active assisted range of motion for extension of digits in between each repetition Active range of motion for palmar radial abduction of the thumb Pressing against foam into table for finger extension for 5 reps for 2 sets Small piece of 1" square foam for opposition of thumb to each digit, in alternating fashion AAROM with passive end range stretch for finger extension each digit. Red foam rolling again at the end of session  with cues for finger extension while rolling  Pt has difficulty with PIP extension with MCP flexion Added table slides for wrist extension this date, 10 reps for 2 sets   PATIENT EDUCATION: Education details: Findings of evaluation, home exercises and splint use Person educated: Patient Education method: Explanation, Demonstration, Tactile cues, Verbal cues, and Handouts Education comprehension: verbalized understanding, returned demonstration, verbal cues required, tactile cues required, and needs further education GOALS: Goals reviewed with patient? Yes  SHORT TERM GOALS: Target date: 1 wk   Patient to be independent and wearing of hand-based extension splint at nighttime to maintain extension of third and fourth digits Baseline: Patient arrived with Steri-Strips  on focusing more on fisting and extension, -30 and -40 PIP extension at third and fourth right hand Goal status: INITIAL  LONG TERM GOALS: Target date: 8 weeks  Flexion of right digits improved for patient to touch palm to initiate use in ADLs with no increase symptoms Baseline: Steri-Strips removed today applied to more again-MC flexion 65 to 80 degrees, PIP 90 to 100 degrees-discomfort and pain 1-2/10 Goal status: INITIAL  2.  Patient maintain extension of right digits third and fourth better than -30 and-40 degrees to be able to don gloves, wash face and reach for cylinder objects without issues Baseline: Patient arrived with -30 to -40 PIP extension,  Goal status: INITIAL  3.  Scar tissue improved for patient to be able to make composite fist as well as maintain extension and tolerate using tools Baseline: Steri-Strips removed today; not initiating scar massage yet ;reapplied 2-3 Steri-Strips again; unable to make composite fist as well as extension at PIPs -30 to -40 degrees Goal status: INITIAL  4.  Right grip and prehension strength increased to more than 75% compared to the left for patient to initiate using hand with tools and yard work without increased issues. Baseline: Steri-Strips removed and reapplied to 3; flexion extension still limited, scar massage not initiated yet-patient 3 weeks postop Goal status: INITIAL  ASSESSMENT:  CLINICAL IMPRESSION: Patient presented at occupational therapy evaluation about 3 weeks postop Dupuytren's release on the right dominant hand for third and fourth digits.  At evaluation patient arrived with Steri-Strips in place some of them loosening up.  OT removed and reapplied 1 at distal palmar crease and 2 at proximal phalanges of the fourth digits.  Patient was fitted with a Tubigrip to wear during the day as well as nighttime with also fabricating a dorsal hand-based extension splint for 3rd through 5th digits to wear at nighttime. Pt continues to  make great progress, but continues to demonstrate limitations in flexion of digits as well as extension of PIPs of 3rd through 5th.  Pt still with an area of scabbing in distal palm and ring finger, scar massage initiated.    Pt able to recall exercises but also has his HEP sheet to refer to when needed.  Improvements noted in ROM especially with finger extension this date with measurements.  Pt demonstrates difficulty with PIP extension with MCP flexion, decreased wrist extension this date, added table slides to home program for 10 reps for 1-2 sets.  Patient with increased edema, scar tissue limiting flexion and extension of digits and grip and prehension strength.  Patient can benefit from skilled OT services to decrease edema and scar tissue increase motion and strength to return to prior level of function.  PERFORMANCE DEFICITS: in functional skills including ADLs, IADLs, coordination, dexterity, edema, ROM, strength, pain, fascial restrictions, flexibility, decreased knowledge of  precautions, decreased knowledge of use of DME, and UE functional use,  , and psychosocial skills including habits and routines and behaviors.   IMPAIRMENTS: are limiting patient from ADLs, IADLs, play, leisure, and social participation.   COMORBIDITIES: has no other co-morbidities that affects occupational performance. Patient will benefit from skilled OT to address above impairments and improve overall function.  MODIFICATION OR ASSISTANCE TO COMPLETE EVALUATION: No modification of tasks or assist necessary to complete an evaluation.  OT OCCUPATIONAL PROFILE AND HISTORY: Problem focused assessment: Including review of records relating to presenting problem.  CLINICAL DECISION MAKING: LOW - limited treatment options, no task modification necessary  REHAB POTENTIAL: Good  EVALUATION COMPLEXITY: Low   PLAN:  OT FREQUENCY: 1-2x/week  OT DURATION: 8 weeks  PLANNED INTERVENTIONS: therapeutic exercise, manual  therapy, scar mobilization, passive range of motion, splinting, paraffin, fluidotherapy, contrast bath, patient/family education, and DME and/or AE instructions, ADL's   CONSULTED AND AGREED WITH PLAN OF CARE: Patient  Dimitria Ketchum, OTR/L,CLT 12/12/2022, 9:46 AM

## 2022-12-12 NOTE — Therapy (Signed)
OUTPATIENT OCCUPATIONAL THERAPY ORTHO TREATMENT  Patient Name: [name] MRN: [id number] DOB:October 03, 1945, 77 y.o., male Today's Date: 12/12/2022  PCP: Dr Sena Slate PROVIDER: Dr Alan Mulder PA  END OF SESSION:  OT End of Session - 12/12/22 1021     Visit Number 6    Number of Visits 12    Date for OT Re-Evaluation 01/20/23    OT Start Time 0953    OT Stop Time 1022    OT Time Calculation (min) 29 min    Activity Tolerance Patient tolerated treatment well    Behavior During Therapy [facility] for tasks assessed/performed             Past Medical History:  Diagnosis Date   Anemia    Ascending aorta dilatation (HCC) 08/08/2021   a.) TTE 08/08/2021: measured up to 4.0 cm   Beta thalassemia minor    CAD (coronary artery disease) 04/18/2015   a.) LHC 04/18/2015: 80% dLCx, 99% OM2, 70% D1-1, 99% D1-2, 60% mLAD, 70 m-dLAD, 75% dLAD, 100% pRCA -- CVTS consult; b.) s/p 4v CABG 04/21/2015   Dupuytren's contracture of both hands    a.) s/p release (LEFT) in ~2000   Dyspnea    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 Mod inf and anteroapical mixed perfusion defect concerning for ischemia   Laceration of left knee    LBBB (left bundle branch block)    Long-term use of aspirin therapy    NSTEMI (non-ST elevated myocardial infarction) (HCC) 04/16/2015   a.) troponins trended: 0.06 --> 0.35 --> 0.89 ng/L; b.) 80% dLCx, 99% OM2, 70% D1-1, 99% D1-2, 60% mLAD, 70 m-dLAD, 75% dLAD, 100% pRCA -- CVTS consult; c.) s/p 4v CABG 04/21/2015   Postoperative atrial fibrillation (HCC) 04/23/2015   a.) POD-2 following CABG -- OOB to BR when atrial arrythmia began (had RVR) --> started on IV amiodarone and converted to NSR   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   a.) LIMA-LAD, SVG-PD, SVG-diagonal, SVG-LCx   Stage  3a chronic kidney disease (CKD) (HCC)    Thrombocytopenia (HCC)    Type II diabetes mellitus (HCC)    Unstable angina (HCC)    Ventricular bigeminy    Past Surgical History:  Procedure Laterality Date   CARDIAC CATHETERIZATION N/A 04/18/2015   Procedure: Left Heart Cath and Coronary Angiography;  Surgeon: Alwyn Pea, MD;  Location: [facility] INVASIVE CV LAB;  Service: Cardiovascular;  Laterality: N/A;   CATARACT EXTRACTION W/ INTRAOCULAR LENS IMPLANT Right 07/14/2017   CATARACT EXTRACTION W/ INTRAOCULAR LENS IMPLANT Left 07/28/2017   COLONOSCOPY  2014   cleared for 10 yrs   CORONARY ARTERY BYPASS GRAFT N/A 04/21/2015   Procedure: CORONARY ARTERY BYPASS GRAFTING (CABG) X 4 UTILIZING THE LEFT INTERNAL MAMMARY ARTERY TO LAD, ENDOSCOPICALLY HARVESTED RIGHT SAPHENEOUS VEINS TO PD,DIAGONAL AND CIRCUMFLEX.;  Surgeon: Delight Ovens, MD;  Location: [facility] OR;  Service: Open Heart Surgery;  Laterality: N/A;   DUPUYTREN CONTRACTURE RELEASE Left ~ 2000   DUPUYTREN CONTRACTURE RELEASE Right 11/06/2022   Procedure: EXCISION OF DUPUYTREN'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 RING FINGERS;  Surgeon: Christena Flake, MD;  Location: [facility] ORS;  Service: Orthopedics;  Laterality: Right;   INCISION AND DRAINAGE Left 07/18/2020   Procedure: INCISION AND DRAINAGE LEFT KNEE;  Surgeon: Ross Marcus, MD;  Location: [facility] ORS;  Service: Orthopedics;  Laterality: Left;   KNEE ARTHROSCOPY Right ~ 1995   TEE WITHOUT CARDIOVERSION N/A 04/21/2015   Procedure: TRANSESOPHAGEAL ECHOCARDIOGRAM (TEE);  Surgeon: Delight Ovens, MD;  Location: [facility] OR;  Service: Open Heart Surgery;  Laterality: N/A;   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)    Hyperlipidemia    Laceration of left knee 07/18/2020   Knee laceration, left, initial encounter 07/18/2020   Hyperlipidemia  associated with type 2 diabetes mellitus (HCC) 05/13/2019   Erectile dysfunction 05/13/2019   Pyuria 01/30/2019   Stage 3 chronic kidney disease (HCC) 01/30/2019   Beta thalassemia minor 03/20/2016   Anemia 03/06/2016   CAD of autologous bypass graft 05/10/2015   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   Presence of aortocoronary bypass graft 04/21/2015   Thrombocytopenia (HCC) 04/20/2015   Coronary artery disease 04/18/2015   Unstable angina (HCC) 04/17/2015   Elevated troponin 04/17/2015   DM type 2 causing CKD stage 3 (HCC) 04/17/2015   Essential hypertension 04/17/2015    ONSET DATE: 11/06/22  REFERRING DIAG: Right hand Dupuytren's release third /fourth digits  THERAPY DIAG:  Scar condition and fibrosis of skin  Localized edema  Muscle weakness (generalized)  Stiffness of left hand, not elsewhere classified  Rationale for Evaluation and Treatment:   SUBJECTIVE:   SUBJECTIVE STATEMENT: I forgot to bring the splint.  He may be adjusted.  This will scab from 1 little finger fell off this morning.  Trying to use it more but the palm is the little tender and then my hand feels very tight Pt accompanied by: self  PERTINENT HISTORY: Ortho note 11/20/22  - [name] is a 77 y.o. who presents today status post excision of Dupuytren contracture of right long and right ring finger performed on 11/06/2022. Patient doing well. Neurologically he is the same as he was preoperatively. 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 Pain is controllable. Patient's only concern is that it does seem to be a lot more swollen than he anticipated.  Stitches removed Steri-Strips applied referred to OT   PRECAUTIONS: None      WEIGHT BEARING RESTRICTIONS: Steri-Strips still in place  PAIN:  Are you having pain?  Tight more than pain-little tender over the scars  FALLS: Has patient fallen in last 6 months? No  LIVING ENVIRONMENT: Lives with: lives with their family and lives with  their spouse     PLOF: Had years ago Dupuytren's release on the left hand that was successful-right hand got worse the last few years contracting  PATIENT GOALS: To maintain progress with surgery and extension to be able to put gloves on, put hand in pocket and  grasping objects better  NEXT MD VISIT: 16 Sept  OBJECTIVE:   HAND DOMINANCE: Right  A  UPPER EXTREMITY ROM:     Active ROM Right eval Left eval  Shoulder flexion    Shoulder abduction    Shoulder adduction    Shoulder extension    Shoulder internal rotation    Shoulder external rotation    Elbow flexion    Elbow extension    Wrist flexion 55   Wrist extension 55   Wrist ulnar deviation WNL   Wrist radial deviation WNL   Wrist pronation    Wrist supination    (  Blank rows = not tested)  Active ROM Right eval Left eval R 11/29/22 12/12/22 R  Thumb MCP (0-60)      Thumb IP (0-80)      Thumb Radial abd/add (0-55) 50      Thumb Palmar abd/add (0-45) 55      Thumb Opposition to Small Finger       Index MCP (0-90) 65   75 80  Index PIP (0-100) 100   90 95  Index DIP (0-70)        Long MCP (0-90)  75   75 90  Long PIP (0-100)  90 ( -35 ext   90  (ext - 20 95  Ext -20  Long DIP (0-70)        Ring MCP (0-90)  80   70 80  Ring PIP (0-100) 90 (-40 ext   95 (ext -32 95  Ext - 30  Ring DIP (0-70)        Little MCP (0-90)  80   80 85  Little PIP (0-100)  95 (-30 ext   90(ext -30 95  Ext - 25  Little DIP (0-70)        (Blank rows = not tested)    EDEMA: Edema in hand  improving - edema in 4th and 3rd digits  - tight feeling   COGNITION: Overall cognitive status: Within functional limits for tasks assessed    OBSERVATIONS: Still 3 scabs in the palm- TODAY'S TREATMENT:                                                                                                                              DATE:12/12/22: Patient to continue with same home program.  Tolerating well and showing progress in  [facility]  and PIP  flexion - extension about same - 5th PIP better- pt to bring splint for night time in next visit to adjust.  TEnderness  and discomfort mostly in the palm less than a 2/10.  Pt fitted with silicone sleeves for 3rd and 4th -for only night time use -to decrease edema and scar tissue  Scar massage done by OT manually and using mini massager - pt to do at home - and roll volar hand over red foam roller for soft tissue and PIP extnetion stretch  Tendon glides 10 reps blocking proximal distal palmar crease as well as proximal phalanges And composite fist able to touch palm now  Active range of motion for palmar radial abduction of the thumb Table slides review with pt to make sure not causing wrist pain -  20reps  2 x day  Done heat prior for about 4-5 min with LMB splint for PIP extention stretch to 4th digit        PATIENT EDUCATION: Education details: Findings of evaluation, home exercises and splint use Person educated: Patient Education method: Explanation, Demonstration, Tactile cues, Verbal cues, and Handouts Education comprehension: verbalized understanding, returned demonstration, verbal  cues required, tactile cues required, and needs further education    GOALS: Goals reviewed with patient? Yes  SHORT TERM GOALS: Target date: 1 wk   Patient to be independent and wearing of hand-based extension splint at nighttime to maintain extension of third and fourth digits Baseline: Patient arrived with Steri-Strips on focusing more on fisting and extension, -30 and -40 PIP extension at third and fourth right hand Goal status: INITIAL  LONG TERM GOALS: Target date: 8 weeks  Flexion of right digits improved for patient to touch palm to initiate use in ADLs with no increase symptoms Baseline: Steri-Strips removed today applied to more again-[facility] flexion 65 to 80 degrees, PIP 90 to 100 degrees-discomfort and pain 1-2/10 Goal status: INITIAL  2.  Patient maintain extension of right  digits third and fourth better than -30 and-40 degrees to be able to don gloves, wash face and reach for cylinder objects without issues Baseline: Patient arrived with -30 to -40 PIP extension,  Goal status: INITIAL  3.  Scar tissue improved for patient to be able to make composite fist as well as maintain extension and tolerate using tools Baseline: Steri-Strips removed today; not initiating scar massage yet ;reapplied 2-3 Steri-Strips again; unable to make composite fist as well as extension at PIPs -30 to -40 degrees Goal status: INITIAL  4.  Right grip and prehension strength increased to more than 75% compared to the left for patient to initiate using hand with tools and yard work without increased issues. Baseline: Steri-Strips removed and reapplied to 3; flexion extension still limited, scar massage not initiated yet-patient 3 weeks postop Goal status: INITIAL  ASSESSMENT:  CLINICAL IMPRESSION: Patient present at occupational therapy evaluation about 3 weeks postop Dupuytren's release on the right dominant hand for third and fourth digits.  Patient arrive today with great progress and scar healing as well as digit flexion able to touch palm.  Progressing in digit extension.  Still 3 areas of scabs and palm with reports of tenderness.  Reported mostly tightness in the hand.  More than pain.  Recommend for patient to bring hand-based extension splint for nighttime use in next visit to adjust if needed.  Initiated scar massage and patient to continue to do at home.  Fitted with silicone sleeves for third and fourth digits to wear at nighttime only.  Patient with increased edema, scar tissue limiting flexion and extension of digits and grip and prehension strength.  Patient can benefit from skilled OT services to decrease edema and scar tissue increase motion and strength to return to prior level of function.  PERFORMANCE DEFICITS: in functional skills including ADLs, IADLs, coordination,  dexterity, edema, ROM, strength, pain, fascial restrictions, flexibility, decreased knowledge of precautions, decreased knowledge of use of DME, and UE functional use,  , and psychosocial skills including habits and routines and behaviors.   IMPAIRMENTS: are limiting patient from ADLs, IADLs, play, leisure, and social participation.   COMORBIDITIES: has no other co-morbidities that affects occupational performance. Patient will benefit from skilled OT to address above impairments and improve overall function.  MODIFICATION OR ASSISTANCE TO COMPLETE EVALUATION: No modification of tasks or assist necessary to complete an evaluation.  OT OCCUPATIONAL PROFILE AND HISTORY: Problem focused assessment: Including review of records relating to presenting problem.  CLINICAL DECISION MAKING: LOW - limited treatment options, no task modification necessary  REHAB POTENTIAL: Good  EVALUATION COMPLEXITY: Low      PLAN:  OT FREQUENCY: 1-2x/week  OT DURATION: 8 weeks  PLANNED INTERVENTIONS:  therapeutic exercise, manual therapy, scar mobilization, passive range of motion, splinting, paraffin, fluidotherapy, contrast bath, patient/family education, and DME and/or AE instructions, ADL's     CONSULTED AND AGREED WITH PLAN OF CARE:      Oletta Cohn, OTR/L,CLT 12/12/2022, 10:23 AM

## 2022-12-15 ENCOUNTER — Other Ambulatory Visit: Payer: Self-pay | Admitting: Family Medicine

## 2022-12-15 DIAGNOSIS — E1159 Type 2 diabetes mellitus with other circulatory complications: Secondary | ICD-10-CM

## 2022-12-17 ENCOUNTER — Ambulatory Visit: Payer: Medicare Other | Admitting: Occupational Therapy

## 2022-12-17 DIAGNOSIS — R600 Localized edema: Secondary | ICD-10-CM

## 2022-12-17 DIAGNOSIS — M6281 Muscle weakness (generalized): Secondary | ICD-10-CM

## 2022-12-17 DIAGNOSIS — M25642 Stiffness of left hand, not elsewhere classified: Secondary | ICD-10-CM

## 2022-12-17 DIAGNOSIS — L905 Scar conditions and fibrosis of skin: Secondary | ICD-10-CM | POA: Diagnosis not present

## 2022-12-17 NOTE — Therapy (Addendum)
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 Mod inf and anteroapical mixed perfusion defect concerning for ischemia   Laceration of left knee    LBBB (left bundle branch block)    Long-term use of aspirin therapy    NSTEMI (non-ST elevated myocardial infarction) (HCC) 04/16/2015   a.) troponins trended: 0.06 --> 0.35 --> 0.89 ng/L; b.) 80% dLCx, 99% OM2, 70% D1-1, 99% D1-2, 60% mLAD, 70 m-dLAD, 75% dLAD, 100% pRCA -- CVTS consult; c.) s/p 4v CABG 04/21/2015   Postoperative atrial fibrillation (HCC) 04/23/2015   a.) POD-2 following CABG -- OOB to BR when atrial arrythmia began (had RVR) --> started on IV amiodarone and converted to NSR   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   a.) LIMA-LAD, SVG-PD, SVG-diagonal, SVG-LCx   Stage 3a chronic kidney disease (CKD) (HCC)     Thrombocytopenia (HCC)    Type II diabetes mellitus (HCC)    Unstable angina (HCC)    Ventricular bigeminy    Past Surgical History:  Procedure Laterality Date   CARDIAC CATHETERIZATION N/A 04/18/2015   Procedure: Left Heart Cath and Coronary Angiography;  Surgeon: Alwyn Pea, MD;  Location: [facility] INVASIVE CV LAB;  Service: Cardiovascular;  Laterality: N/A;   CATARACT EXTRACTION W/ INTRAOCULAR LENS IMPLANT Right 07/14/2017   CATARACT EXTRACTION W/ INTRAOCULAR LENS IMPLANT Left 07/28/2017   COLONOSCOPY  2014   cleared for 10 yrs   CORONARY ARTERY BYPASS GRAFT N/A 04/21/2015   Procedure: CORONARY ARTERY BYPASS GRAFTING (CABG) X 4 UTILIZING THE LEFT INTERNAL MAMMARY ARTERY TO LAD, ENDOSCOPICALLY HARVESTED RIGHT SAPHENEOUS VEINS TO PD,DIAGONAL AND CIRCUMFLEX.;  Surgeon: Delight Ovens, MD;  Location: [facility] OR;  Service: Open Heart Surgery;  Laterality: N/A;   DUPUYTREN CONTRACTURE RELEASE Left ~ 2000   DUPUYTREN CONTRACTURE RELEASE Right 11/06/2022   Procedure: EXCISION OF DUPUYTREN'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 RING FINGERS;  Surgeon: Christena Flake, MD;  Location: [facility] ORS;  Service: Orthopedics;  Laterality: Right;   INCISION AND DRAINAGE Left 07/18/2020   Procedure: INCISION AND DRAINAGE LEFT KNEE;  Surgeon: Ross Marcus, MD;  Location: [facility] ORS;  Service: Orthopedics;  Laterality: Left;   KNEE ARTHROSCOPY Right ~  1995   TEE WITHOUT CARDIOVERSION N/A 04/21/2015   Procedure: TRANSESOPHAGEAL ECHOCARDIOGRAM (TEE);  Surgeon: Delight Ovens, MD;  Location: [facility] OR;  Service: Open Heart Surgery;  Laterality: N/A;   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)    Hyperlipidemia    Laceration of left knee 07/18/2020   Knee laceration, left, initial encounter 07/18/2020   Hyperlipidemia associated with type 2 diabetes mellitus (HCC)  05/13/2019   Erectile dysfunction 05/13/2019   Pyuria 01/30/2019   Stage 3 chronic kidney disease (HCC) 01/30/2019   Beta thalassemia minor 03/20/2016   Anemia 03/06/2016   CAD of autologous bypass graft 05/10/2015   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   Presence of aortocoronary bypass graft 04/21/2015   Thrombocytopenia (HCC) 04/20/2015   Coronary artery disease 04/18/2015   Unstable angina (HCC) 04/17/2015   Elevated troponin 04/17/2015   DM type 2 causing CKD stage 3 (HCC) 04/17/2015   Essential hypertension 04/17/2015    ONSET DATE: 11/06/22  REFERRING DIAG: Right hand Dupuytren's release third /fourth digits  THERAPY DIAG:  Scar condition and fibrosis of skin  Localized edema  Muscle weakness (generalized)  Stiffness of left hand, not elsewhere classified  Rationale for Evaluation and Treatment:   SUBJECTIVE:   SUBJECTIVE STATEMENT: Last scab came off this morning in my palm - did bring my night splint -tenderness in palm getting better  Pt accompanied by: self  PERTINENT HISTORY: Ortho note 11/20/22  - [name] is a 77 y.o. who presents today status post excision of Dupuytren contracture of right long and right ring finger performed on 11/06/2022. Patient doing well. Neurologically he is the same as he was preoperatively. 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 Pain is controllable. Patient's only concern is that it does seem to be a lot more swollen than he anticipated.  Stitches removed Steri-Strips applied referred to OT   PRECAUTIONS: None      WEIGHT BEARING RESTRICTIONS: Steri-Strips still in place  PAIN:  Are you having pain?  Tight more than pain-little tender over the scars  FALLS: Has patient fallen in last 6 months? No  LIVING ENVIRONMENT: Lives with: lives with their family and lives with their spouse     PLOF: Had years ago Dupuytren's release on the left hand that was successful-right hand got worse the last few years  contracting  PATIENT GOALS: To maintain progress with surgery and extension to be able to put gloves on, put hand in pocket and  grasping objects better  NEXT MD VISIT: 16 Sept  OBJECTIVE:   HAND DOMINANCE: Right  A  UPPER EXTREMITY ROM:     Active ROM Right eval Left [name]  12/17/22   Shoulder flexion     Shoulder abduction     Shoulder adduction     Shoulder extension     Shoulder internal rotation     Shoulder external rotation     Elbow flexion     Elbow extension     Wrist flexion 55  75  Wrist extension 55  55  Wrist ulnar deviation WNL    Wrist radial deviation WNL    Wrist pronation     Wrist supination     (Blank rows = not tested)  Active ROM Right eval Left eval R 11/29/22 12/12/22 R R 12/17/22  Thumb MCP (0-60)       Thumb IP (0-80)       Thumb Radial abd/add (0-55) 50       Thumb Palmar abd/add (0-45) 55       Thumb Opposition to Small Finger        Index MCP (0-90) 65   75 80   Index PIP (0-100) 100   90 95   Index DIP (0-70)         Long MCP (0-90)  75   75 90 90  Long PIP (0-100)  90 ( -35 ext   90  (ext - 20 95  Ext -20 100  Long DIP (0-70)         Ring MCP (0-90)  80   70 80 85  Ring PIP (0-100) 90 (-40 ext   95 (ext -32 95  Ext - 30 95 ( - 20  Ring DIP (0-70)          TREATMENT 12/17/22  - After   Tendon glides 10 reps blocking proximal distal palmar crease as well as proximal phalanges And composite fist able to touch palm now  Active range of motion for palmar radial abduction of the thumb Table slides review with pt to make sure not causing wrist pain -  20reps  2 x day  Done heat prior for about 4-5 min with LMB splint for PIP extention stretch to 4th digit        PATIENT EDUCATION: Education details: Findings of evaluation, home exercises and splint use Person educated: Patient Education method: Explanation, Demonstration, Tactile cues, Verbal cues, and Handouts Education comprehension: verbalized understanding,  returned demonstration, verbal cues required, tactile cues required, and needs further education    GOALS: Goals reviewed with patient? Yes  SHORT TERM GOALS: Target date: 1 wk   Patient to be independent and wearing of hand-based extension splint at nighttime to maintain extension of third and fourth digits Baseline: Patient arrived with Steri-Strips on focusing more on fisting and extension, -30 and -40 PIP extension at third and fourth right hand Goal status: INITIAL  LONG TERM GOALS: Target date: 8 weeks  Flexion of right digits improved for patient to touch palm to initiate use in ADLs with no increase symptoms Baseline: Steri-Strips removed today applied to more again-MC flexion 65 to 80 degrees, PIP 90 to 100 degrees-discomfort and pain 1-2/10 Goal status: INITIAL  2.  Patient maintain extension of right digits third and fourth better than -30 and-40 degrees to be able to don gloves, wash face and reach for cylinder objects without issues Baseline: Patient arrived with -30 to -40 PIP extension,  Goal status: INITIAL  3.  Scar tissue improved for patient to be able to make composite fist as well as maintain extension and tolerate using tools Baseline: Steri-Strips removed today; not initiating scar massage yet ;reapplied 2-3 Steri-Strips again; unable to make composite fist as well as extension at PIPs -30 to -40 degrees Goal status: INITIAL  4.  Right grip and prehension strength increased to more than 75% compared to the left for patient to initiate using hand with tools and yard work without increased issues. Baseline: Steri-Strips removed and reapplied to 3; flexion extension still limited, scar massage not initiated yet-patient 3 weeks postop Goal status: INITIAL  ASSESSMENT:  CLINICAL IMPRESSION: Patient present at occupational therapy evaluation about 3 weeks postop Dupuytren's release on the right dominant hand for third and fourth digits.  Patient arrive today with  great progress and scar healing as well as digit flexion able to touch palm.  Progressing in digit extension.  Still 3 areas of scabs and palm with reports of tenderness.  Reported mostly tightness in the hand.  More than pain.  Recommend for patient to bring hand-based extension splint for nighttime use in next visit to adjust if needed.  Initiated scar massage and patient to continue to do at home.  Fitted with silicone sleeves for third and fourth digits to wear at nighttime only.  Patient with increased edema, scar tissue limiting flexion and extension of digits and grip and prehension strength.  Patient can benefit from skilled OT services to decrease edema and scar tissue increase motion and strength to return to prior level of function.  PERFORMANCE DEFICITS: in functional skills including ADLs, IADLs, coordination, dexterity, edema, ROM, strength, pain, fascial restrictions, flexibility, decreased knowledge of precautions, decreased knowledge of use of DME, and UE functional use,  , and psychosocial skills including habits and routines and behaviors.   IMPAIRMENTS: are limiting patient from ADLs, IADLs, play, leisure, and social participation.   COMORBIDITIES: has no other co-morbidities that affects occupational performance. Patient will benefit from skilled OT to address above impairments and improve overall function.  MODIFICATION OR ASSISTANCE TO COMPLETE EVALUATION: No modification of tasks or assist necessary to complete an evaluation.  OT OCCUPATIONAL PROFILE AND HISTORY: Problem focused assessment: Including review of records relating to presenting problem.  CLINICAL DECISION MAKING: LOW - limited treatment options, no task modification necessary  REHAB POTENTIAL: Good  EVALUATION COMPLEXITY: Low      PLAN:  OT FREQUENCY: 1-2x/week  OT DURATION: 8 weeks  PLANNED INTERVENTIONS: therapeutic exercise, manual therapy, scar mobilization, passive range of motion, splinting,  paraffin, fluidotherapy, contrast bath, patient/family education, and DME and/or AE instructions, ADL's     CONSULTED AND AGREED WITH PLAN OF CARE:      Oletta Cohn, OTR/L,CLT 12/17/2022, 10:34 AM

## 2022-12-17 NOTE — Telephone Encounter (Signed)
Requested Prescriptions  Pending Prescriptions Disp Refills   glipiZIDE (GLUCOTROL) 10 MG tablet [Pharmacy Med Name: GLIPIZIDE 10 MG TABLET] 180 tablet 0    Sig: TAKE 1 TABLET (10 MG TOTAL) BY MOUTH TWICE A DAY BEFORE A MEAL     Endocrinology:  Diabetes - Sulfonylureas Failed - 12/15/2022  2:31 PM      Failed - HBA1C is between 0 and 7.9 and within 180 days    Hemoglobin A1C  Date Value Ref Range Status  04/26/2022 7.3  Final         Failed - Cr in normal range and within 360 days    Creatinine  Date Value Ref Range Status  03/28/2022 1.4 (A) 0.6 - 1.3 Final  07/16/2011 1.00 0.60 - 1.30 mg/dL Final   Creatinine, Ser  Date Value Ref Range Status  01/22/2022 1.76 (H) 0.76 - 1.27 mg/dL Final         Passed - Valid encounter within last 6 months    Recent Outpatient Visits           4 months ago Essential hypertension   [facility], MD   10 months ago Type 2 diabetes mellitus with other circulatory complication, without long-term current use of insulin (HCC)   [facility] [facility], MD   1 year ago Type 2 diabetes mellitus with other circulatory complication, without long-term current use of insulin (HCC)   [place] [facility], MD   1 year ago Type 2 diabetes mellitus with other circulatory complication, without long-term current use of insulin (HCC)   [facility], MD   2 years ago Type 2 diabetes mellitus with other circulatory complication, without long-term current use of insulin (HCC)   [place] [facility], MD       Future Appointments             In 1 month Duanne Limerick, MD [facility] at [facility], [facility]

## 2022-12-20 ENCOUNTER — Ambulatory Visit: Payer: Medicare Other | Admitting: Occupational Therapy

## 2022-12-20 DIAGNOSIS — L905 Scar conditions and fibrosis of skin: Secondary | ICD-10-CM | POA: Diagnosis not present

## 2022-12-20 DIAGNOSIS — M6281 Muscle weakness (generalized): Secondary | ICD-10-CM | POA: Diagnosis not present

## 2022-12-20 DIAGNOSIS — M25642 Stiffness of left hand, not elsewhere classified: Secondary | ICD-10-CM | POA: Diagnosis not present

## 2022-12-20 DIAGNOSIS — R600 Localized edema: Secondary | ICD-10-CM | POA: Diagnosis not present

## 2022-12-20 NOTE — Therapy (Signed)
OUTPATIENT OCCUPATIONAL THERAPY ORTHO TREATMENT  Patient Name: [name] MRN: [id number] DOB:10-Jul-1945, 77 y.o., male Today's Date: 12/20/2022  PCP: Dr [name] PROVIDER: Dr Robert Mulder PA  END OF SESSION:  OT End of Session - 12/20/22 1247     Visit Number 8    Number of Visits 12    Date for OT Re-Evaluation 01/20/23    OT Start Time 0950    OT Stop Time 1028    OT Time Calculation (min) 38 min    Activity Tolerance Patient tolerated treatment well    Behavior During Therapy [facility] for tasks assessed/performed             Past Medical History:  Diagnosis Date   Anemia    Ascending aorta dilatation (HCC) 08/08/2021   a.) TTE 08/08/2021: measured up to 4.0 cm   Beta thalassemia minor    CAD (coronary artery disease) 04/18/2015   a.) LHC 04/18/2015: 80% dLCx, 99% OM2, 70% D1-1, 99% D1-2, 60% mLAD, 70 m-dLAD, 75% dLAD, 100% pRCA -- CVTS consult; b.) s/p 4v CABG 04/21/2015   Dupuytren's contracture of both hands    a.) s/p release (LEFT) in ~2000   Dyspnea    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 Mod inf and anteroapical mixed perfusion defect concerning for ischemia   Laceration of left knee    LBBB (left bundle branch block)    Long-term use of aspirin therapy    NSTEMI (non-ST elevated myocardial infarction) (HCC) 04/16/2015   a.) troponins trended: 0.06 --> 0.35 --> 0.89 ng/L; b.) 80% dLCx, 99% OM2, 70% D1-1, 99% D1-2, 60% mLAD, 70 m-dLAD, 75% dLAD, 100% pRCA -- CVTS consult; c.) s/p 4v CABG 04/21/2015   Postoperative atrial fibrillation (HCC) 04/23/2015   a.) POD-2 following CABG -- OOB to BR when atrial arrythmia began (had RVR) --> started on IV amiodarone and converted to NSR   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   a.) LIMA-LAD, SVG-PD, SVG-diagonal, SVG-LCx    Stage 3a chronic kidney disease (CKD) (HCC)    Thrombocytopenia (HCC)    Type II diabetes mellitus (HCC)    Unstable angina (HCC)    Ventricular bigeminy    Past Surgical History:  Procedure Laterality Date   CARDIAC CATHETERIZATION N/A 04/18/2015   Procedure: Left Heart Cath and Coronary Angiography;  Surgeon: Robert Pea, MD;  Location: [facility] INVASIVE CV [facility];  Service: Cardiovascular;  Laterality: N/A;   CATARACT EXTRACTION W/ INTRAOCULAR LENS IMPLANT Right 07/14/2017   CATARACT EXTRACTION W/ INTRAOCULAR LENS IMPLANT Left 07/28/2017   COLONOSCOPY  2014   cleared for 10 yrs   CORONARY ARTERY BYPASS GRAFT N/A 04/21/2015   Procedure: CORONARY ARTERY BYPASS GRAFTING (CABG) X 4 UTILIZING THE LEFT INTERNAL MAMMARY ARTERY TO LAD, ENDOSCOPICALLY HARVESTED RIGHT SAPHENEOUS VEINS TO PD,DIAGONAL AND CIRCUMFLEX.;  Surgeon: Robert Ovens, MD;  Location: [facility] OR;  Service: Open Heart Surgery;  Laterality: N/A;   DUPUYTREN CONTRACTURE RELEASE Left ~ 2000   DUPUYTREN CONTRACTURE RELEASE Right 11/06/2022   Procedure: EXCISION OF DUPUYTREN'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 RING FINGERS;  Surgeon: Robert Flake, MD;  Location: [facility] ORS;  Service: Orthopedics;  Laterality: Right;   INCISION AND DRAINAGE Left 07/18/2020   Procedure: INCISION AND DRAINAGE LEFT KNEE;  Surgeon: Robert Marcus, MD;  Location: [facility] ORS;  Service: Orthopedics;  Laterality: Left;   KNEE ARTHROSCOPY Right ~ 1995   TEE WITHOUT CARDIOVERSION N/A 04/21/2015   Procedure: TRANSESOPHAGEAL ECHOCARDIOGRAM (TEE);  Surgeon: Robert Ovens, MD;  [facility];  Service: Open Heart Surgery;  Laterality: N/A;   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)    Hyperlipidemia    Laceration of left knee 07/18/2020   Knee laceration, left, initial encounter 07/18/2020   Hyperlipidemia  associated with type 2 diabetes mellitus (HCC) 05/13/2019   Erectile dysfunction 05/13/2019   Pyuria 01/30/2019   Stage 3 chronic kidney disease (HCC) 01/30/2019   Beta thalassemia minor 03/20/2016   Anemia 03/06/2016   CAD of autologous bypass graft 05/10/2015   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   Presence of aortocoronary bypass graft 04/21/2015   Thrombocytopenia (HCC) 04/20/2015   Coronary artery disease 04/18/2015   Unstable angina (HCC) 04/17/2015   Elevated troponin 04/17/2015   DM type 2 causing CKD stage 3 (HCC) 04/17/2015   Essential hypertension 04/17/2015    ONSET DATE: 11/06/22  REFERRING DIAG: Right hand Dupuytren's release third /fourth digits  THERAPY DIAG:  Scar condition and fibrosis of skin  Localized edema  Stiffness of left hand, not elsewhere classified  Muscle weakness (generalized)  Rationale for Evaluation and Treatment:   SUBJECTIVE:   SUBJECTIVE STATEMENT: My hand just tight - can clap hands now, hold knife, using it much more at home Pt accompanied by: self  PERTINENT HISTORY: Ortho note 11/20/22  - [name] is a 77 y.o. who presents today status post excision of Dupuytren contracture of right long and right ring finger performed on 11/06/2022. Patient doing well. Neurologically he is the same as he was preoperatively. 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 Pain is controllable. Patient's only concern is that it does seem to be a lot more swollen than he anticipated.  Stitches removed Steri-Strips applied referred to OT   PRECAUTIONS: None      WEIGHT BEARING RESTRICTIONS: Steri-Strips still in place  PAIN:  Are you having pain?  Tight more than pain-little tender over the scars  FALLS: Has patient fallen in last 6 months? No  LIVING ENVIRONMENT: Lives with: lives with their family and lives with their spouse     PLOF: Had years ago Dupuytren's release on the left hand that was successful-right hand got worse  the last few years contracting  PATIENT GOALS: To maintain progress with surgery and extension to be able to put gloves on, put hand in pocket and  grasping objects better  NEXT MD VISIT: 16 Sept  OBJECTIVE:   HAND DOMINANCE: Right  A  UPPER EXTREMITY ROM:     Active ROM Right eval Left Elenora Fender  12/17/22  R  12/20/22  Shoulder flexion      Shoulder abduction      Shoulder adduction      Shoulder extension      Shoulder internal rotation      Shoulder external rotation      Elbow flexion      Elbow extension      Wrist flexion 55  75 75  Wrist extension 55  55 65  Wrist ulnar deviation WNL     Wrist radial deviation WNL     Wrist pronation  Wrist supination      (Blank rows = not tested)  Active ROM Right eval Left eval R 11/29/22 12/12/22 R R 12/17/22  Thumb MCP (0-60)       Thumb IP (0-80)       Thumb Radial abd/add (0-55) 50       Thumb Palmar abd/add (0-45) 55       Thumb Opposition to Small Finger        Index MCP (0-90) 65   75 80   Index PIP (0-100) 100   90 95   Index DIP (0-70)         Long MCP (0-90)  75   75 90 90  Long PIP (0-100)  90 ( -35 ext   90  (ext - 20 95  Ext -20 100  Long DIP (0-70)         Ring MCP (0-90)  80   70 80 85  Ring PIP (0-100) 90 (-40 ext   95 (ext -32 95  Ext - 30 95 ( - 20  Ring DIP (0-70)          TREATMENT 9/13 /24 Scar massage and mini massager done to volar palm and 3rd and 4th digit  With PROM extention of digits   Assess PIP extention -worse than last time Review again using LMB splint during heat  And night  extention splint Focus on scar tissue on volar 3rd and 4th digits  Done PROM and extended stretch on table of PIP extention   Tendon glides 10 reps blocking proximal distal palmar crease as well as proximal phalanges And composite fist able to touch palm  Active range of motion for palmar radial abduction of the thumb Table slides review again for composite extention - afterwards 70 degrees  20  reps  2 x day     PATIENT EDUCATION: Education details: Findings of evaluation, home exercises and splint use Person educated: Patient Education method: Explanation, Demonstration, Tactile cues, Verbal cues, and Handouts Education comprehension: verbalized understanding, returned demonstration, verbal cues required, tactile cues required, and needs further education    GOALS: Goals reviewed with patient? Yes  SHORT TERM GOALS: Target date: 1 wk   Patient to be independent and wearing of hand-based extension splint at nighttime to maintain extension of third and fourth digits Baseline: Patient arrived with Steri-Strips on focusing more on fisting and extension, -30 and -40 PIP extension at third and fourth right hand Goal status: INITIAL  LONG TERM GOALS: Target date: 8 weeks  Flexion of right digits improved for patient to touch palm to initiate use in ADLs with no increase symptoms Baseline: Steri-Strips removed today applied to more again-MC flexion 65 to 80 degrees, PIP 90 to 100 degrees-discomfort and pain 1-2/10 Goal status: INITIAL  2.  Patient maintain extension of right digits third and fourth better than -30 and-40 degrees to be able to don gloves, wash face and reach for cylinder objects without issues Baseline: Patient arrived with -30 to -40 PIP extension,  Goal status: INITIAL  3.  Scar tissue improved for patient to be able to make composite fist as well as maintain extension and tolerate using tools Baseline: Steri-Strips removed today; not initiating scar massage yet ;reapplied 2-3 Steri-Strips again; unable to make composite fist as well as extension at PIPs -30 to -40 degrees Goal status: INITIAL  4.  Right grip and prehension strength increased to more than 75% compared to the left for patient to initiate using  hand with tools and yard work without increased issues. Baseline: Steri-Strips removed and reapplied to 3; flexion extension still limited, scar  massage not initiated yet-patient 3 weeks postop Goal status: INITIAL  ASSESSMENT:  CLINICAL IMPRESSION: Patient present at occupational therapy evaluation  postop Dupuytren's release on the right dominant hand for third and fourth digits.  Patient making great progress and scar healing as well as digit flexion able to touch palm.  Progressing in digit extension, monitor if pt can maintain extention - still wearing night extention splint- reported mostly tightness in the hand than pain. Show increase use of R hand.  Pt to cont and OT focus on scar massage .Fitted with silicone sleeves for third and fourth digits to wear at nighttime only.  Patient with increased  scar tissue limiting flexion and extension of digits and grip and prehension strength.  Patient can benefit from skilled OT services to decrease edema and scar tissue increase motion and strength to return to prior level of function.  PERFORMANCE DEFICITS: in functional skills including ADLs, IADLs, coordination, dexterity, edema, ROM, strength, pain, fascial restrictions, flexibility, decreased knowledge of precautions, decreased knowledge of use of DME, and UE functional use,  , and psychosocial skills including habits and routines and behaviors.   IMPAIRMENTS: are limiting patient from ADLs, IADLs, play, leisure, and social participation.   COMORBIDITIES: has no other co-morbidities that affects occupational performance. Patient will benefit from skilled OT to address above impairments and improve overall function.  MODIFICATION OR ASSISTANCE TO COMPLETE EVALUATION: No modification of tasks or assist necessary to complete an evaluation.  OT OCCUPATIONAL PROFILE AND HISTORY: Problem focused assessment: Including review of records relating to presenting problem.  CLINICAL DECISION MAKING: LOW - limited treatment options, no task modification necessary  REHAB POTENTIAL: Good  EVALUATION COMPLEXITY: Low      PLAN:  OT  FREQUENCY: 1-2x/week  OT DURATION: 8 weeks  PLANNED INTERVENTIONS: therapeutic exercise, manual therapy, scar mobilization, passive range of motion, splinting, paraffin, fluidotherapy, contrast bath, patient/family education, and DME and/or AE instructions, ADL's     CONSULTED AND AGREED WITH PLAN OF CARE:      Oletta Cohn, OTR/L,CLT 12/20/2022, 12:49 PM

## 2022-12-24 ENCOUNTER — Ambulatory Visit: Payer: Medicare Other | Admitting: Occupational Therapy

## 2022-12-24 DIAGNOSIS — L905 Scar conditions and fibrosis of skin: Secondary | ICD-10-CM

## 2022-12-24 DIAGNOSIS — M6281 Muscle weakness (generalized): Secondary | ICD-10-CM

## 2022-12-24 DIAGNOSIS — M25642 Stiffness of left hand, not elsewhere classified: Secondary | ICD-10-CM

## 2022-12-24 DIAGNOSIS — R600 Localized edema: Secondary | ICD-10-CM

## 2022-12-24 NOTE — Therapy (Signed)
OUTPATIENT OCCUPATIONAL THERAPY ORTHO TREATMENT  Patient Name: [name] MRN: [id number] DOB:July 15, 1945, 77 y.o., male Today's Date: 12/24/2022  PCP: Dr Sena Slate PROVIDER: Dr Alan Mulder PA  END OF SESSION:  OT End of Session - 12/24/22 0903     Visit Number 9    Number of Visits 12    Date for OT Re-Evaluation 01/20/23    OT Start Time 0903    OT Stop Time 0945    OT Time Calculation (min) 42 min    Activity Tolerance Patient tolerated treatment well    Behavior During Therapy [name] for tasks assessed/performed             Past Medical History:  Diagnosis Date   Anemia    Ascending aorta dilatation (HCC) 08/08/2021   a.) TTE 08/08/2021: measured up to 4.0 cm   Beta thalassemia minor    CAD (coronary artery disease) 04/18/2015   a.) LHC 04/18/2015: 80% dLCx, 99% OM2, 70% D1-1, 99% D1-2, 60% mLAD, 70 m-dLAD, 75% dLAD, 100% pRCA -- CVTS consult; b.) s/p 4v CABG 04/21/2015   Dupuytren's contracture of both hands    a.) s/p release (LEFT) in ~2000   Dyspnea    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 Mod inf and anteroapical mixed perfusion defect concerning for ischemia   Laceration of left knee    LBBB (left bundle branch block)    Long-term use of aspirin therapy    NSTEMI (non-ST elevated myocardial infarction) (HCC) 04/16/2015   a.) troponins trended: 0.06 --> 0.35 --> 0.89 ng/L; b.) 80% dLCx, 99% OM2, 70% D1-1, 99% D1-2, 60% mLAD, 70 m-dLAD, 75% dLAD, 100% pRCA -- CVTS consult; c.) s/p 4v CABG 04/21/2015   Postoperative atrial fibrillation (HCC) 04/23/2015   a.) POD-2 following CABG -- OOB to BR when atrial arrythmia began (had RVR) --> started on IV amiodarone and converted to NSR   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   a.) LIMA-LAD, SVG-PD, SVG-diagonal, SVG-LCx    Stage 3a chronic kidney disease (CKD) (HCC)    Thrombocytopenia (HCC)    Type II diabetes mellitus (HCC)    Unstable angina (HCC)    Ventricular bigeminy    Past Surgical History:  Procedure Laterality Date   CARDIAC CATHETERIZATION N/A 04/18/2015   Procedure: Left Heart Cath and Coronary Angiography;  Surgeon: Robert Pea, MD;  Location: [facility];  Service: Cardiovascular;  Laterality: N/A;   CATARACT EXTRACTION W/ INTRAOCULAR LENS IMPLANT Right 07/14/2017   CATARACT EXTRACTION W/ INTRAOCULAR LENS IMPLANT Left 07/28/2017   COLONOSCOPY  2014   cleared for 10 yrs   CORONARY ARTERY BYPASS GRAFT N/A 04/21/2015   Procedure: CORONARY ARTERY BYPASS GRAFTING (CABG) X 4 UTILIZING THE LEFT INTERNAL MAMMARY ARTERY TO LAD, ENDOSCOPICALLY HARVESTED RIGHT SAPHENEOUS VEINS TO PD,DIAGONAL AND CIRCUMFLEX.;  Surgeon: Robert Ovens, MD;  Location: [facility] OR;  Service: Open Heart Surgery;  Laterality: N/A;   DUPUYTREN CONTRACTURE RELEASE Left ~ 2000   DUPUYTREN CONTRACTURE RELEASE Right 11/06/2022   Procedure: EXCISION OF DUPUYTREN'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 RING FINGERS;  Surgeon: Robert Flake, MD;  Location: [facility];  Service: Orthopedics;  Laterality: Right;   INCISION AND DRAINAGE Left 07/18/2020   Procedure: INCISION AND DRAINAGE LEFT KNEE;  Surgeon: Robert Marcus, MD;  Location: [facility];  Service: Orthopedics;  Laterality: Left;   KNEE ARTHROSCOPY Right ~ 1995   TEE WITHOUT CARDIOVERSION N/A 04/21/2015   Procedure: TRANSESOPHAGEAL ECHOCARDIOGRAM (TEE);  Surgeon: Robert Ovens, MD;  Location: [facility] OR;  Service: Open Heart Surgery;  Laterality: N/A;   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)    Hyperlipidemia    Laceration of left knee 07/18/2020   Knee laceration, left, initial encounter 07/18/2020   Hyperlipidemia  associated with type 2 diabetes mellitus (HCC) 05/13/2019   Erectile dysfunction 05/13/2019   Pyuria 01/30/2019   Stage 3 chronic kidney disease (HCC) 01/30/2019   Beta thalassemia minor 03/20/2016   Anemia 03/06/2016   CAD of autologous bypass graft 05/10/2015   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   Presence of aortocoronary bypass graft 04/21/2015   Thrombocytopenia (HCC) 04/20/2015   Coronary artery disease 04/18/2015   Unstable angina (HCC) 04/17/2015   Elevated troponin 04/17/2015   DM type 2 causing CKD stage 3 (HCC) 04/17/2015   Essential hypertension 04/17/2015    ONSET DATE: 11/06/22  REFERRING DIAG: Right hand Dupuytren's release third /fourth digits  THERAPY DIAG:  Scar condition and fibrosis of skin  Localized edema  Stiffness of left hand, not elsewhere classified  Muscle weakness (generalized)  Rationale for Evaluation and Treatment:   SUBJECTIVE:   SUBJECTIVE STATEMENT: I seen Dr [name]- was happy with progress- just stiffness but using it more Pt accompanied by: self  PERTINENT HISTORY: Ortho note 11/20/22  - [name] is a 77 y.o. who presents today status post excision of Dupuytren contracture of right long and right ring finger performed on 11/06/2022. Patient doing well. Neurologically he is the same as he was preoperatively. 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 Pain is controllable. Patient's only concern is that it does seem to be a lot more swollen than he anticipated.  Stitches removed Steri-Strips applied referred to OT   PRECAUTIONS: None      WEIGHT BEARING RESTRICTIONS: Steri-Strips still in place  PAIN:  Are you having pain?  Tight more than pain-little tender over the scars  FALLS: Has patient fallen in last 6 months? No  LIVING ENVIRONMENT: Lives with: lives with their family and lives with their spouse     PLOF: Had years ago Dupuytren's release on the left hand that was successful-right hand got worse the  last few years contracting  PATIENT GOALS: To maintain progress with surgery and extension to be able to put gloves on, put hand in pocket and  grasping objects better  NEXT MD VISIT: 4th Nov  OBJECTIVE:   HAND DOMINANCE: Right  A  UPPER EXTREMITY ROM:     Active ROM Right eval Left Elenora Fender  12/17/22  R  12/20/22  Shoulder flexion      Shoulder abduction      Shoulder adduction      Shoulder extension      Shoulder internal rotation      Shoulder external rotation      Elbow flexion      Elbow extension      Wrist flexion 55  75 75  Wrist extension 55  55 65  Wrist ulnar deviation WNL     Wrist radial deviation WNL     Wrist pronation  Wrist supination      (Blank rows = not tested)  Active ROM Right eval Left eval R 11/29/22 12/12/22 R R 12/17/22  Thumb MCP (0-60)       Thumb IP (0-80)       Thumb Radial abd/add (0-55) 50       Thumb Palmar abd/add (0-45) 55       Thumb Opposition to Small Finger        Index MCP (0-90) 65   75 80   Index PIP (0-100) 100   90 95   Index DIP (0-70)         Long MCP (0-90)  75   75 90 90  Long PIP (0-100)  90 ( -35 ext   90  (ext - 20 95  Ext -20 100  Long DIP (0-70)         Ring MCP (0-90)  80   70 80 85  Ring PIP (0-100) 90 (-40 ext   95 (ext -32 95  Ext - 30 95 ( - 20  Ring DIP (0-70)          TREATMENT 12/24/22 FLuido therapy done with 2 x rotation of ice for 1 min  Decrease edema and stiffness  Scar massage and mini massager done to volar palm and 3rd and 4th digit  Joint mobs to PIP of 3rd and 4th prior to ROM   With PROM extention of digits- focus and review on paper -PROM at PIP  Table slides 20 reps Review again using LMB splint  several times during day for 10 min if no increase pain and edema  Pt to stop wearing night  extention splint until end of week and will reassess if stiffness better and can maintain extention at PIP's Focus on scar tissue on volar 3rd and 4th digits     Tendon glides 10 reps  blocking proximal distal palmar crease as well as proximal phalanges And composite fist able to touch palm  Active range of motion for palmar radial abduction of the thumb Teal med putty add for pt to do gripping 15 reps   2 x day  Followed by rolling 15 reps for soft tissue massage and PIP extention   2 x day     PATIENT EDUCATION: Education details: Findings of evaluation, home exercises and splint use Person educated: Patient Education method: Explanation, Demonstration, Tactile cues, Verbal cues, and Handouts Education comprehension: verbalized understanding, returned demonstration, verbal cues required, tactile cues required, and needs further education    GOALS: Goals reviewed with patient? Yes  SHORT TERM GOALS: Target date: 1 wk   Patient to be independent and wearing of hand-based extension splint at nighttime to maintain extension of third and fourth digits Baseline: Patient arrived with Steri-Strips on focusing more on fisting and extension, -30 and -40 PIP extension at third and fourth right hand Goal status: INITIAL  LONG TERM GOALS: Target date: 8 weeks  Flexion of right digits improved for patient to touch palm to initiate use in ADLs with no increase symptoms Baseline: Steri-Strips removed today applied to more again-[facility] flexion 65 to 80 degrees, PIP 90 to 100 degrees-discomfort and pain 1-2/10 Goal status: INITIAL  2.  Patient maintain extension of right digits third and fourth better than -30 and-40 degrees to be able to don gloves, wash face and reach for cylinder objects without issues Baseline: Patient arrived with -30 to -40 PIP extension,  Goal status: INITIAL  3.  Scar tissue  improved for patient to be able to make composite fist as well as maintain extension and tolerate using tools Baseline: Steri-Strips removed today; not initiating scar massage yet ;reapplied 2-3 Steri-Strips again; unable to make composite fist as well as extension at PIPs -30 to -40  degrees Goal status: INITIAL  4.  Right grip and prehension strength increased to more than 75% compared to the left for patient to initiate using hand with tools and yard work without increased issues. Baseline: Steri-Strips removed and reapplied to 3; flexion extension still limited, scar massage not initiated yet-patient 3 weeks postop Goal status: INITIAL  ASSESSMENT:  CLINICAL IMPRESSION: Patient present at occupational therapy evaluation  postop Dupuytren's release on the right dominant hand for third and fourth digits.  Patient making great progress and scar healing as well as digit flexion able to touch palm.  Progressing in digit extension, monitor if pt can maintain extention - pt to stop wearing night extention splint for rest of week and will reassess- pt to address extention of digits during HEP and day with PROM , LMB extention splint and rolling over putty - table slides to cont with too. Show increase use of R hand.  Pt to cont and OT focus on scar massage .Fitted with silicone sleeves for third and fourth digits to wear at nighttime only.  Patient  improving with  scar tissue limiting flexion and extension of digits and grip and prehension strength.  Patient can benefit from skilled OT services to decrease edema and scar tissue increase motion and strength to return to prior level of function.  PERFORMANCE DEFICITS: in functional skills including ADLs, IADLs, coordination, dexterity, edema, ROM, strength, pain, fascial restrictions, flexibility, decreased knowledge of precautions, decreased knowledge of use of DME, and UE functional use,  , and psychosocial skills including habits and routines and behaviors.   IMPAIRMENTS: are limiting patient from ADLs, IADLs, play, leisure, and social participation.   COMORBIDITIES: has no other co-morbidities that affects occupational performance. Patient will benefit from skilled OT to address above impairments and improve overall  function.  MODIFICATION OR ASSISTANCE TO COMPLETE EVALUATION: No modification of tasks or assist necessary to complete an evaluation.  OT OCCUPATIONAL PROFILE AND HISTORY: Problem focused assessment: Including review of records relating to presenting problem.  CLINICAL DECISION MAKING: LOW - limited treatment options, no task modification necessary  REHAB POTENTIAL: Good  EVALUATION COMPLEXITY: Low      PLAN:  OT FREQUENCY: 1-2x/week  OT DURATION: 8 weeks  PLANNED INTERVENTIONS: therapeutic exercise, manual therapy, scar mobilization, passive range of motion, splinting, paraffin, fluidotherapy, contrast bath, patient/family education, and DME and/or AE instructions, ADL's     CONSULTED AND AGREED WITH PLAN OF CARE:      Oletta Cohn, OTR/L,CLT 12/24/2022, 12:58 PM

## 2022-12-27 ENCOUNTER — Ambulatory Visit: Payer: Medicare Other | Admitting: Occupational Therapy

## 2022-12-27 DIAGNOSIS — L905 Scar conditions and fibrosis of skin: Secondary | ICD-10-CM

## 2022-12-27 DIAGNOSIS — M25642 Stiffness of left hand, not elsewhere classified: Secondary | ICD-10-CM

## 2022-12-27 DIAGNOSIS — M6281 Muscle weakness (generalized): Secondary | ICD-10-CM

## 2022-12-27 DIAGNOSIS — R600 Localized edema: Secondary | ICD-10-CM

## 2022-12-27 NOTE — Therapy (Signed)
OUTPATIENT OCCUPATIONAL THERAPY ORTHO TREATMENT/10th visit  Patient Name: [name] MRN: [id number] DOB:10-20-1945, 76 y.o., male Today's Date: 12/27/2022  PCP: Dr Sena Slate PROVIDER: Dr Alan Mulder PA  END OF SESSION:  OT End of Session - 12/27/22 1144     Visit Number 10    Number of Visits 12    Date for OT Re-Evaluation 01/20/23    OT Start Time 0902    OT Stop Time 0946    OT Time Calculation (min) 44 min    Activity Tolerance Patient tolerated treatment well    Behavior During Therapy [facility] for tasks assessed/performed             Past Medical History:  Diagnosis Date   Anemia    Ascending aorta dilatation (HCC) 08/08/2021   a.) TTE 08/08/2021: measured up to 4.0 cm   Beta thalassemia minor    CAD (coronary artery disease) 04/18/2015   a.) LHC 04/18/2015: 80% dLCx, 99% OM2, 70% D1-1, 99% D1-2, 60% mLAD, 70 m-dLAD, 75% dLAD, 100% pRCA -- CVTS consult; b.) s/p 4v CABG 04/21/2015   Dupuytren's contracture of both hands    a.) s/p release (LEFT) in ~2000   Dyspnea    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 Mod inf and anteroapical mixed perfusion defect concerning for ischemia   Laceration of left knee    LBBB (left bundle branch block)    Long-term use of aspirin therapy    NSTEMI (non-ST elevated myocardial infarction) (HCC) 04/16/2015   a.) troponins trended: 0.06 --> 0.35 --> 0.89 ng/L; b.) 80% dLCx, 99% OM2, 70% D1-1, 99% D1-2, 60% mLAD, 70 m-dLAD, 75% dLAD, 100% pRCA -- CVTS consult; c.) s/p 4v CABG 04/21/2015   Postoperative atrial fibrillation (HCC) 04/23/2015   a.) POD-2 following CABG -- OOB to BR when atrial arrythmia began (had RVR) --> started on IV amiodarone and converted to NSR   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   a.) LIMA-LAD, SVG-PD, SVG-diagonal,  SVG-LCx   Stage 3a chronic kidney disease (CKD) (HCC)    Thrombocytopenia (HCC)    Type II diabetes mellitus (HCC)    Unstable angina (HCC)    Ventricular bigeminy    Past Surgical History:  Procedure Laterality Date   CARDIAC CATHETERIZATION N/A 04/18/2015   Procedure: Left Heart Cath and Coronary Angiography;  Surgeon: Alwyn Pea, MD;  Location: [facility] INVASIVE CV LAB;  Service: Cardiovascular;  Laterality: N/A;   CATARACT EXTRACTION W/ INTRAOCULAR LENS IMPLANT Right 07/14/2017   CATARACT EXTRACTION W/ INTRAOCULAR LENS IMPLANT Left 07/28/2017   COLONOSCOPY  2014   cleared for 10 yrs   CORONARY ARTERY BYPASS GRAFT N/A 04/21/2015   Procedure: CORONARY ARTERY BYPASS GRAFTING (CABG) X 4 UTILIZING THE LEFT INTERNAL MAMMARY ARTERY TO LAD, ENDOSCOPICALLY HARVESTED RIGHT SAPHENEOUS VEINS TO PD,DIAGONAL AND CIRCUMFLEX.;  Surgeon: Delight Ovens, MD;  Location: [facility] OR;  Service: Open Heart Surgery;  Laterality: N/A;   DUPUYTREN CONTRACTURE RELEASE Left ~ 2000   DUPUYTREN CONTRACTURE RELEASE Right 11/06/2022   Procedure: EXCISION OF DUPUYTREN'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 RING FINGERS;  Surgeon: Christena Flake, MD;  Location: [facility] ORS;  Service: Orthopedics;  Laterality: Right;   INCISION AND DRAINAGE Left 07/18/2020   Procedure: INCISION AND DRAINAGE LEFT KNEE;  Surgeon: Ross Marcus,  MD;  Location: [facility] ORS;  Service: Orthopedics;  Laterality: Left;   KNEE ARTHROSCOPY Right ~ 1995   TEE WITHOUT CARDIOVERSION N/A 04/21/2015   Procedure: TRANSESOPHAGEAL ECHOCARDIOGRAM (TEE);  Surgeon: Delight Ovens, MD;  Location: [facility] OR;  Service: Open Heart Surgery;  Laterality: N/A;   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)    Hyperlipidemia    Laceration of left knee 07/18/2020   Knee laceration, left, initial encounter 07/18/2020    Hyperlipidemia associated with type 2 diabetes mellitus (HCC) 05/13/2019   Erectile dysfunction 05/13/2019   Pyuria 01/30/2019   Stage 3 chronic kidney disease (HCC) 01/30/2019   Beta thalassemia minor 03/20/2016   Anemia 03/06/2016   CAD of autologous bypass graft 05/10/2015   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   Presence of aortocoronary bypass graft 04/21/2015   Thrombocytopenia (HCC) 04/20/2015   Coronary artery disease 04/18/2015   Unstable angina (HCC) 04/17/2015   Elevated troponin 04/17/2015   DM type 2 causing CKD stage 3 (HCC) 04/17/2015   Essential hypertension 04/17/2015    ONSET DATE: 11/06/22  REFERRING DIAG: Right hand Dupuytren's release third /fourth digits  THERAPY DIAG:  Scar condition and fibrosis of skin  Localized edema  Stiffness of left hand, not elsewhere classified  Muscle weakness (generalized)  Rationale for Evaluation and Treatment:   SUBJECTIVE:   SUBJECTIVE STATEMENT: Still some what stiff - not really pain - did not sleep with splint the last 2 nights -scar on middle finger feels better- done the putty - no problem Pt accompanied by: self  PERTINENT HISTORY: Ortho note 11/20/22  - [name] is a 77 y.o. who presents today status post excision of Dupuytren contracture of right long and right ring finger performed on 11/06/2022. Patient doing well. Neurologically he is the same as he was preoperatively. 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 Pain is controllable. Patient's only concern is that it does seem to be a lot more swollen than he anticipated.  Stitches removed Steri-Strips applied referred to OT   PRECAUTIONS: None      WEIGHT BEARING RESTRICTIONS: Steri-Strips still in place  PAIN:  Are you having pain?  Tight more than pain-little tender over the scars  FALLS: Has patient fallen in last 6 months? No  LIVING ENVIRONMENT: Lives with: lives with their family and lives with their spouse     PLOF: Had  years ago Dupuytren's release on the left hand that was successful-right hand got worse the last few years contracting  PATIENT GOALS: To maintain progress with surgery and extension to be able to put gloves on, put hand in pocket and  grasping objects better  NEXT MD VISIT: 4th Nov  OBJECTIVE:   HAND DOMINANCE: Right  A  UPPER EXTREMITY ROM:     Active ROM Right eval Left Elenora Fender  12/17/22  R  12/20/22  Shoulder flexion      Shoulder abduction      Shoulder adduction      Shoulder extension      Shoulder internal rotation      Shoulder external rotation      Elbow flexion      Elbow extension      Wrist flexion 55  75 75  Wrist extension 55  55 65  Wrist ulnar deviation WNL  Wrist radial deviation WNL     Wrist pronation      Wrist supination      (Blank rows = not tested)  Active ROM Right eval Left eval R 11/29/22 12/12/22 R R 12/17/22  Thumb MCP (0-60)       Thumb IP (0-80)       Thumb Radial abd/add (0-55) 50       Thumb Palmar abd/add (0-45) 55       Thumb Opposition to Small Finger        Index MCP (0-90) 65   75 80   Index PIP (0-100) 100   90 95   Index DIP (0-70)         Long MCP (0-90)  75   75 90 90  Long PIP (0-100)  90 ( -35 ext   90  (ext - 20 95  Ext -20 100  Long DIP (0-70)         Ring MCP (0-90)  80   70 80 85  Ring PIP (0-100) 90 (-40 ext   95 (ext -32 95  Ext - 30 95 ( - 20  Ring DIP (0-70)          TREATMENT 12/26/22 FLuido therapy done  Decrease stiffness with AROM for wrist and hand  Scar massage and mini massager done to volar palm and 3rd and 4th digit  Joint mobs to PIP of 3rd and 4th prior to ROM  And joint mobs and light traction to wrist prior to AAROM wrist flexion and extention Pt to do at home - did not do it - forgot Followed by table slides - 20 reps   With PROM extention of digits- on table - behind PIP's    Review again using LMB splint  several times during day for 10 min if no increase pain and edema  Pt was  able to maintain PIP extension without  wearing night  extention splint the last 2 nights      Tendon glides 10 reps blocking proximal distal palmar crease as well as proximal phalanges And composite fist able to touch palm  Active range of motion for palmar radial abduction of the thumb Teal med putty add for pt to do gripping 15 reps -reviewed again with patient  2 x day  Followed by rolling 15 reps for soft tissue massage and PIP extention in between  2 x day  At this day lateral pinch and 3-point pinch with teal putty 15 reps Patient can increase to a second set in 3 days if no increased swelling or pain.  Patient to focus on maintaining PIP extension as he is progressing with his home exercises adding strengthening.  PATIENT EDUCATION: Education details: Findings of evaluation, home exercises and splint use Person educated: Patient Education method: Explanation, Demonstration, Tactile cues, Verbal cues, and Handouts Education comprehension: verbalized understanding, returned demonstration, verbal cues required, tactile cues required, and needs further education    GOALS: Goals reviewed with patient? Yes  SHORT TERM GOALS: Target date: 1 wk   Patient to be independent and wearing of hand-based extension splint at nighttime to maintain extension of third and fourth digits Baseline: Patient arrived with Steri-Strips on focusing more on fisting and extension, -30 and -40 PIP extension at third and fourth right hand Goal status: INITIAL  LONG TERM GOALS: Target date: 8 weeks  Flexion of right digits improved for patient to touch palm to initiate use in ADLs with no increase symptoms  Baseline: Steri-Strips removed today applied to more again-MC flexion 65 to 80 degrees, PIP 90 to 100 degrees-discomfort and pain 1-2/10 Goal status: INITIAL  2.  Patient maintain extension of right digits third and fourth better than -30 and-40 degrees to be able to don gloves, wash face and reach  for cylinder objects without issues Baseline: Patient arrived with -30 to -40 PIP extension,  Goal status: INITIAL  3.  Scar tissue improved for patient to be able to make composite fist as well as maintain extension and tolerate using tools Baseline: Steri-Strips removed today; not initiating scar massage yet ;reapplied 2-3 Steri-Strips again; unable to make composite fist as well as extension at PIPs -30 to -40 degrees Goal status: INITIAL  4.  Right grip and prehension strength increased to more than 75% compared to the left for patient to initiate using hand with tools and yard work without increased issues. Baseline: Steri-Strips removed and reapplied to 3; flexion extension still limited, scar massage not initiated yet-patient 3 weeks postop Goal status: INITIAL  ASSESSMENT:  CLINICAL IMPRESSION: Patient present at occupational therapy evaluation  postop Dupuytren's release on the right dominant hand for third and fourth digits.  Patient seen for 10th visit today.  Patient made excellent progress and decrease scar tissue as well as flexion of digits able to touch palm.  Discharged earlier this week night extension splint to decrease possible stiffness.  Patient to focus on maintaining and progressing PIP extension of third and fourth digits as well as maintaining them as adding putty for gripping and prehension strength.   Patient decreased to 1 time a week.  Patient still with increased scar tissue on the fourth volar digit as well as decreased grip and prehension strength with stiffness in the hand more than pain.  Patient can benefit from skilled OT services to decrease edema and scar tissue increase motion and strength to return to prior level of function.  PERFORMANCE DEFICITS: in functional skills including ADLs, IADLs, coordination, dexterity, edema, ROM, strength, pain, fascial restrictions, flexibility, decreased knowledge of precautions, decreased knowledge of use of DME, and UE  functional use,  , and psychosocial skills including habits and routines and behaviors.   IMPAIRMENTS: are limiting patient from ADLs, IADLs, play, leisure, and social participation.   COMORBIDITIES: has no other co-morbidities that affects occupational performance. Patient will benefit from skilled OT to address above impairments and improve overall function.  MODIFICATION OR ASSISTANCE TO COMPLETE EVALUATION: No modification of tasks or assist necessary to complete an evaluation.  OT OCCUPATIONAL PROFILE AND HISTORY: Problem focused assessment: Including review of records relating to presenting problem.  CLINICAL DECISION MAKING: LOW - limited treatment options, no task modification necessary  REHAB POTENTIAL: Good  EVALUATION COMPLEXITY: Low      PLAN:  OT FREQUENCY: 1-2x/week  OT DURATION: 8 weeks  PLANNED INTERVENTIONS: therapeutic exercise, manual therapy, scar mobilization, passive range of motion, splinting, paraffin, fluidotherapy, contrast bath, patient/family education, and DME and/or AE instructions, ADL's     CONSULTED AND AGREED WITH PLAN OF CARE:      [name], OTR/L,CLT 12/27/2022, 11:45 AM

## 2023-01-02 ENCOUNTER — Ambulatory Visit: Payer: Medicare Other | Admitting: Occupational Therapy

## 2023-01-02 DIAGNOSIS — M6281 Muscle weakness (generalized): Secondary | ICD-10-CM | POA: Diagnosis not present

## 2023-01-02 DIAGNOSIS — M25642 Stiffness of left hand, not elsewhere classified: Secondary | ICD-10-CM

## 2023-01-02 DIAGNOSIS — L905 Scar conditions and fibrosis of skin: Secondary | ICD-10-CM

## 2023-01-02 DIAGNOSIS — R600 Localized edema: Secondary | ICD-10-CM | POA: Diagnosis not present

## 2023-01-02 NOTE — Therapy (Signed)
OUTPATIENT OCCUPATIONAL THERAPY ORTHO TREATMENT  Patient Name: [name] MRN: [id number] DOB:1945-05-14, 77 y.o., male Today's Date: 01/02/2023  PCP: Dr Sena Slate PROVIDER: Dr Alan Mulder PA  END OF SESSION:  OT End of Session - 01/02/23 1026     Visit Number 11    Number of Visits 12    Date for OT Re-Evaluation 01/20/23    OT Start Time 1028    OT Stop Time 1112    OT Time Calculation (min) 44 min    Activity Tolerance Patient tolerated treatment well    Behavior During Therapy [facility] for tasks assessed/performed             Past Medical History:  Diagnosis Date   Anemia    Ascending aorta dilatation (HCC) 08/08/2021   a.) TTE 08/08/2021: measured up to 4.0 cm   Beta thalassemia minor    CAD (coronary artery disease) 04/18/2015   a.) LHC 04/18/2015: 80% dLCx, 99% OM2, 70% D1-1, 99% D1-2, 60% mLAD, 70 m-dLAD, 75% dLAD, 100% pRCA -- CVTS consult; b.) s/p 4v CABG 04/21/2015   Dupuytren's contracture of both hands    a.) s/p release (LEFT) in ~2000   Dyspnea    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 Mod inf and anteroapical mixed perfusion defect concerning for ischemia   Laceration of left knee    LBBB (left bundle branch block)    Long-term use of aspirin therapy    NSTEMI (non-ST elevated myocardial infarction) (HCC) 04/16/2015   a.) troponins trended: 0.06 --> 0.35 --> 0.89 ng/L; b.) 80% dLCx, 99% OM2, 70% D1-1, 99% D1-2, 60% mLAD, 70 m-dLAD, 75% dLAD, 100% pRCA -- CVTS consult; c.) s/p 4v CABG 04/21/2015   Postoperative atrial fibrillation (HCC) 04/23/2015   a.) POD-2 following CABG -- OOB to BR when atrial arrythmia began (had RVR) --> started on IV amiodarone and converted to NSR   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   a.) LIMA-LAD, SVG-PD, SVG-diagonal, SVG-LCx    Stage 3a chronic kidney disease (CKD) (HCC)    Thrombocytopenia (HCC)    Type II diabetes mellitus (HCC)    Unstable angina (HCC)    Ventricular bigeminy    Past Surgical History:  Procedure Laterality Date   CARDIAC CATHETERIZATION N/A 04/18/2015   Procedure: Left Heart Cath and Coronary Angiography;  Surgeon: Alwyn Pea, MD;  Location: [facility] INVASIVE CV LAB;  Service: Cardiovascular;  Laterality: N/A;   CATARACT EXTRACTION W/ INTRAOCULAR LENS IMPLANT Right 07/14/2017   CATARACT EXTRACTION W/ INTRAOCULAR LENS IMPLANT Left 07/28/2017   COLONOSCOPY  2014   cleared for 10 yrs   CORONARY ARTERY BYPASS GRAFT N/A 04/21/2015   Procedure: CORONARY ARTERY BYPASS GRAFTING (CABG) X 4 UTILIZING THE LEFT INTERNAL MAMMARY ARTERY TO LAD, ENDOSCOPICALLY HARVESTED RIGHT SAPHENEOUS VEINS TO PD,DIAGONAL AND CIRCUMFLEX.;  Surgeon: Delight Ovens, MD;  Location: [facility] OR;  Service: Open Heart Surgery;  Laterality: N/A;   DUPUYTREN CONTRACTURE RELEASE Left ~ 2000   DUPUYTREN CONTRACTURE RELEASE Right 11/06/2022   Procedure: EXCISION OF DUPUYTREN'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 RING FINGERS;  Surgeon: Christena Flake, MD;  Location: [facility] ORS;  Service: Orthopedics;  Laterality: Right;   INCISION AND DRAINAGE Left 07/18/2020   Procedure: INCISION AND DRAINAGE LEFT KNEE;  Surgeon: Ross Marcus, MD;  Location: [facility] ORS;  Service: Orthopedics;  Laterality: Left;   KNEE ARTHROSCOPY Right ~ 1995   TEE WITHOUT CARDIOVERSION N/A 04/21/2015   Procedure: TRANSESOPHAGEAL ECHOCARDIOGRAM (TEE);  Surgeon: Delight Ovens, MD;  Location: [facility] OR;  Service: Open Heart Surgery;  Laterality: N/A;   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)    Hyperlipidemia    Laceration of left knee 07/18/2020   Knee laceration, left, initial encounter 07/18/2020   Hyperlipidemia  associated with type 2 diabetes mellitus (HCC) 05/13/2019   Erectile dysfunction 05/13/2019   Pyuria 01/30/2019   Stage 3 chronic kidney disease (HCC) 01/30/2019   Beta thalassemia minor 03/20/2016   Anemia 03/06/2016   CAD of autologous bypass graft 05/10/2015   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   Presence of aortocoronary bypass graft 04/21/2015   Thrombocytopenia (HCC) 04/20/2015   Coronary artery disease 04/18/2015   Unstable angina (HCC) 04/17/2015   Elevated troponin 04/17/2015   DM type 2 causing CKD stage 3 (HCC) 04/17/2015   Essential hypertension 04/17/2015    ONSET DATE: 11/06/22  REFERRING DIAG: Right hand Dupuytren's release third /fourth digits  THERAPY DIAG:  Scar condition and fibrosis of skin  Localized edema  Stiffness of left hand, not elsewhere classified  Muscle weakness (generalized)  Rationale for Evaluation and Treatment:   SUBJECTIVE:   SUBJECTIVE STATEMENT: Done okay -no pain - little hard still to open bottle or pick up something really heavy and pulling on the fingers - stiffness better - just little tight- was with my uncle in ED until this am  Pt accompanied by: self  PERTINENT HISTORY: Ortho note 11/20/22  - [name] is a 77 y.o. who presents today status post excision of Dupuytren contracture of right long and right ring finger performed on 11/06/2022. Patient doing well. Neurologically he is the same as he was preoperatively. 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 Pain is controllable. Patient's only concern is that it does seem to be a lot more swollen than he anticipated.  Stitches removed Steri-Strips applied referred to OT   PRECAUTIONS: None      WEIGHT BEARING RESTRICTIONS: Steri-Strips still in place  PAIN:  Are you having pain?  Tight more than pain-little tender over the scars  FALLS: Has patient fallen in last 6 months? No  LIVING ENVIRONMENT: Lives with: lives with their family and lives with their  spouse     PLOF: Had years ago Dupuytren's release on the left hand that was successful-right hand got worse the last few years contracting  PATIENT GOALS: To maintain progress with surgery and extension to be able to put gloves on, put hand in pocket and  grasping objects better  NEXT MD VISIT: 4th Nov  OBJECTIVE:   HAND DOMINANCE: Right  A  UPPER EXTREMITY ROM:     Active ROM Right eval Left Elenora Fender  12/17/22  R  12/20/22 R 01/02/23  Shoulder flexion       Shoulder abduction       Shoulder adduction       Shoulder extension       Shoulder internal rotation       Shoulder external rotation       Elbow flexion       Elbow extension       Wrist flexion  55  75 75 75  Wrist extension 55  55 65 65 digits flex  Wrist ulnar deviation WNL      Wrist radial deviation WNL      Wrist pronation       Wrist supination       (Blank rows = not tested)  Active ROM Right eval Left eval R 11/29/22 12/12/22 R R 12/17/22 R 01/02/23  Thumb MCP (0-60)        Thumb IP (0-80)        Thumb Radial abd/add (0-55) 50        Thumb Palmar abd/add (0-45) 55        Thumb Opposition to Small Finger         Index MCP (0-90) 65   75 80  75  Index PIP (0-100) 100   90 95  95  Index DIP (0-70)          Long MCP (0-90)  75   75 90 90 90  Long PIP (0-100)  90 ( -35 ext   90  (ext - 20 95  Ext -20 100 100 ( -28 coming in ,session -20   Long DIP (0-70)          Ring MCP (0-90)  80   70 80 85 90  Ring PIP (0-100) 90 (-40 ext   95 (ext -32 95  Ext - 30 95 ( - 20 95  Ring DIP (0-70)            01/02/23 : R grip   27 lbs , L 62    lbs grip strength ; lat pinch R 15  and L 22   lbs ; 3 point pinch R  10 and L15   lbs   TREATMENT 12/26/22 Paraffin with LMB splint on PIP for extention - with heatingpad Increase PIP extention at 4th digit   Scar massage and mini massager done to volar palm and 3rd and 4th digit  Joint mobs to PIP of 3rd and 4th prior to ROM  And joint mobs and light traction to wrist  prior to AAROM wrist flexion and extention Pt to do at home - did not do it - forgot table slides -reinforce again to do  table slides - 20 reps   With PROM extention of digits- on table - behind PIP's    Pt using LMB splint  several times during day for 10 min iof 4th and 3rd   Pt was able to maintain PIP extension without  wearing night  extention splint the last 2 nights      Tendon glides 10 reps blocking proximal distal palmar crease as well as proximal phalanges And composite fist able to touch palm  Active range of motion for palmar radial abduction of the thumb Increase to med firm green putty -gripping  and cylinder  - each 15 reps - 2 x day  Followed by rolling 15 reps for soft tissue massage and PIP extention in between  2 x day  lateral pinch and 3-point pinch with green putty 15 reps Patient can increase to a second set in 3 days if no increased swelling or pain.  Patient to focus on maintaining PIP extension as he is progressing with his home exercises adding strengthening.  PATIENT EDUCATION: Education details: Findings of evaluation, home exercises and splint use Person educated: Patient Education method: Explanation, Demonstration, Tactile cues, Verbal cues, and Handouts Education comprehension: verbalized understanding, returned demonstration, verbal cues  required, tactile cues required, and needs further education    GOALS: Goals reviewed with patient? Yes  SHORT TERM GOALS: Target date: 1 wk   Patient to be independent and wearing of hand-based extension splint at nighttime to maintain extension of third and fourth digits Baseline: Patient arrived with Steri-Strips on focusing more on fisting and extension, -30 and -40 PIP extension at third and fourth right hand Goal status: INITIAL  LONG TERM GOALS: Target date: 8 weeks  Flexion of right digits improved for patient to touch palm to initiate use in ADLs with no increase symptoms Baseline: Steri-Strips  removed today applied to more again-MC flexion 65 to 80 degrees, PIP 90 to 100 degrees-discomfort and pain 1-2/10 Goal status: INITIAL  2.  Patient maintain extension of right digits third and fourth better than -30 and-40 degrees to be able to don gloves, wash face and reach for cylinder objects without issues Baseline: Patient arrived with -30 to -40 PIP extension,  Goal status: INITIAL  3.  Scar tissue improved for patient to be able to make composite fist as well as maintain extension and tolerate using tools Baseline: Steri-Strips removed today; not initiating scar massage yet ;reapplied 2-3 Steri-Strips again; unable to make composite fist as well as extension at PIPs -30 to -40 degrees Goal status: INITIAL  4.  Right grip and prehension strength increased to more than 75% compared to the left for patient to initiate using hand with tools and yard work without increased issues. Baseline: Steri-Strips removed and reapplied to 3; flexion extension still limited, scar massage not initiated yet-patient 3 weeks postop Goal status: INITIAL  ASSESSMENT:  CLINICAL IMPRESSION: Patient present at occupational therapy evaluation  postop Dupuytren's release on the right dominant hand for third and fourth digits.  Patient seen for 10th visit today.  Patient made excellent progress and decrease scar tissue as well as flexion of digits able to touch palm.  Discharged last week night extension splint to decrease possible stiffness.  Patient to focus on maintaining and progressing PIP extension of third and fourth digits as well as maintaining them as adding putty for gripping and prehension strength.   Upgrade to med firm putty  for grip, and prehension with rolling over putty inbetween -  Patient decreased to 1 time a week.  Patient still with increased scar tissue on the fourth volar digit as well as decreased grip and prehension strength with stiffness in the hand more than pain.  Patient can benefit from  skilled OT services to decrease edema and scar tissue increase motion and strength to return to prior level of function.  PERFORMANCE DEFICITS: in functional skills including ADLs, IADLs, coordination, dexterity, edema, ROM, strength, pain, fascial restrictions, flexibility, decreased knowledge of precautions, decreased knowledge of use of DME, and UE functional use,  , and psychosocial skills including habits and routines and behaviors.   IMPAIRMENTS: are limiting patient from ADLs, IADLs, play, leisure, and social participation.   COMORBIDITIES: has no other co-morbidities that affects occupational performance. Patient will benefit from skilled OT to address above impairments and improve overall function.  MODIFICATION OR ASSISTANCE TO COMPLETE EVALUATION: No modification of tasks or assist necessary to complete an evaluation.  OT OCCUPATIONAL PROFILE AND HISTORY: Problem focused assessment: Including review of records relating to presenting problem.  CLINICAL DECISION MAKING: LOW - limited treatment options, no task modification necessary  REHAB POTENTIAL: Good  EVALUATION COMPLEXITY: Low      PLAN:  OT FREQUENCY: 1-2x/week  OT DURATION:  8 weeks  PLANNED INTERVENTIONS: therapeutic exercise, manual therapy, scar mobilization, passive range of motion, splinting, paraffin, fluidotherapy, contrast bath, patient/family education, and DME and/or AE instructions, ADL's     CONSULTED AND AGREED WITH PLAN OF CARE:      Oletta Cohn, OTR/L,CLT 01/02/2023, 11:14 AM

## 2023-01-07 ENCOUNTER — Ambulatory Visit: Payer: Medicare Other | Attending: Physician Assistant | Admitting: Occupational Therapy

## 2023-01-07 DIAGNOSIS — M6281 Muscle weakness (generalized): Secondary | ICD-10-CM | POA: Diagnosis not present

## 2023-01-07 DIAGNOSIS — L905 Scar conditions and fibrosis of skin: Secondary | ICD-10-CM | POA: Insufficient documentation

## 2023-01-07 DIAGNOSIS — R600 Localized edema: Secondary | ICD-10-CM | POA: Insufficient documentation

## 2023-01-07 DIAGNOSIS — M25642 Stiffness of left hand, not elsewhere classified: Secondary | ICD-10-CM | POA: Diagnosis not present

## 2023-01-07 NOTE — Therapy (Signed)
necessary  REHAB POTENTIAL: Good  EVALUATION COMPLEXITY: Low      PLAN:  OT FREQUENCY: 1-2x/week  OT DURATION: 8 weeks  PLANNED INTERVENTIONS: therapeutic exercise, manual therapy, scar mobilization, passive range of motion, splinting, paraffin, fluidotherapy, contrast bath, patient/family education, and DME and/or AE instructions, ADL's     CONSULTED AND AGREED WITH PLAN OF CARE:      Oletta Cohn, OTR/L,CLT 01/07/2023, 5:23 PM  [facility] [address], [place], [address] Phone: [phone]   Fax:  [phone]  Patient Details  Name: [name] MRN: [id number] Date of Birth: 08-26-1945 Referring Provider:  Tera Partridge, PA  Encounter Date: 01/07/2023   Oletta Cohn, OT 01/07/2023, 5:23 PM  [facility] [address], [place], [address] Phone: [phone]    Fax:  [phone]  necessary  REHAB POTENTIAL: Good  EVALUATION COMPLEXITY: Low      PLAN:  OT FREQUENCY: 1-2x/week  OT DURATION: 8 weeks  PLANNED INTERVENTIONS: therapeutic exercise, manual therapy, scar mobilization, passive range of motion, splinting, paraffin, fluidotherapy, contrast bath, patient/family education, and DME and/or AE instructions, ADL's     CONSULTED AND AGREED WITH PLAN OF CARE:      Oletta Cohn, OTR/L,CLT 01/07/2023, 5:23 PM  [facility] [address], [place], [address] Phone: [phone]   Fax:  [phone]  Patient Details  Name: [name] MRN: [id number] Date of Birth: 08-26-1945 Referring Provider:  Tera Partridge, PA  Encounter Date: 01/07/2023   Oletta Cohn, OT 01/07/2023, 5:23 PM  [facility] [address], [place], [address] Phone: [phone]    Fax:  [phone]  OUTPATIENT OCCUPATIONAL THERAPY ORTHO TREATMENT  Patient Name: [name] MRN: [id number] DOB:[birth year], 77 y.o., male Today's Date: 01/07/2023  PCP: Dr Sena Slate PROVIDER: Dr Alan Mulder PA  END OF SESSION:  OT End of Session - 01/07/23 1612     Visit Number 12    Number of Visits 14    Date for OT Re-Evaluation 02/04/23    OT Start Time 1525    OT Stop Time 1610    OT Time Calculation (min) 45 min    Activity Tolerance Patient tolerated treatment well    Behavior During Therapy [facility] for tasks assessed/performed             Past Medical History:  Diagnosis Date   Anemia    Ascending aorta dilatation (HCC) 08/08/2021   a.) TTE 08/08/2021: measured up to 4.0 cm   Beta thalassemia minor    CAD (coronary artery disease) 04/18/2015   a.) LHC 04/18/2015: 80% dLCx, 99% OM2, 70% D1-1, 99% D1-2, 60% mLAD, 70 m-dLAD, 75% dLAD, 100% pRCA -- CVTS consult; b.) s/p 4v CABG 04/21/2015   Dupuytren's contracture of both hands    a.) s/p release (LEFT) in ~2000   Dyspnea    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 Mod inf and anteroapical mixed perfusion defect concerning for ischemia   Laceration of left knee    LBBB (left bundle branch block)    Long-term use of aspirin therapy    NSTEMI (non-ST elevated myocardial infarction) (HCC) 04/16/2015   a.) troponins trended: 0.06 --> 0.35 --> 0.89 ng/L; b.) 80% dLCx, 99% OM2, 70% D1-1, 99% D1-2, 60% mLAD, 70 m-dLAD, 75% dLAD, 100% pRCA -- CVTS consult; c.) s/p 4v CABG 04/21/2015   Postoperative atrial fibrillation (HCC) 04/23/2015   a.) POD-2 following CABG -- OOB to BR when atrial arrythmia began (had RVR) --> started on IV amiodarone and converted to NSR   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   a.) LIMA-LAD, SVG-PD, SVG-diagonal, SVG-LCx    Stage 3a chronic kidney disease (CKD) (HCC)    Thrombocytopenia (HCC)    Type II diabetes mellitus (HCC)    Unstable angina (HCC)    Ventricular bigeminy    Past Surgical History:  Procedure Laterality Date   CARDIAC CATHETERIZATION N/A 04/18/2015   Procedure: Left Heart Cath and Coronary Angiography;  Surgeon: Alwyn Pea, MD;  Location: [facility] INVASIVE CV LAB;  Service: Cardiovascular;  Laterality: N/A;   CATARACT EXTRACTION W/ INTRAOCULAR LENS IMPLANT Right 07/14/2017   CATARACT EXTRACTION W/ INTRAOCULAR LENS IMPLANT Left 07/28/2017   COLONOSCOPY  2014   cleared for 10 yrs   CORONARY ARTERY BYPASS GRAFT N/A 04/21/2015   Procedure: CORONARY ARTERY BYPASS GRAFTING (CABG) X 4 UTILIZING THE LEFT INTERNAL MAMMARY ARTERY TO LAD, ENDOSCOPICALLY HARVESTED RIGHT SAPHENEOUS VEINS TO PD,DIAGONAL AND CIRCUMFLEX.;  Surgeon: Delight Ovens, MD;  Location: [facility] OR;  Service: Open Heart Surgery;  Laterality: N/A;   DUPUYTREN CONTRACTURE RELEASE Left ~ 2000   DUPUYTREN CONTRACTURE RELEASE Right 11/06/2022   Procedure: EXCISION OF DUPUYTREN'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 RING FINGERS;  Surgeon: Christena Flake, MD;  Location: [facility] ORS;  Service: Orthopedics;  Laterality: Right;   INCISION AND DRAINAGE Left 07/18/2020   Procedure: INCISION AND DRAINAGE LEFT KNEE;  Surgeon: Ross Marcus, MD;  OUTPATIENT OCCUPATIONAL THERAPY ORTHO TREATMENT  Patient Name: [name] MRN: [id number] DOB:[birth year], 77 y.o., male Today's Date: 01/07/2023  PCP: Dr Sena Slate PROVIDER: Dr Alan Mulder PA  END OF SESSION:  OT End of Session - 01/07/23 1612     Visit Number 12    Number of Visits 14    Date for OT Re-Evaluation 02/04/23    OT Start Time 1525    OT Stop Time 1610    OT Time Calculation (min) 45 min    Activity Tolerance Patient tolerated treatment well    Behavior During Therapy [facility] for tasks assessed/performed             Past Medical History:  Diagnosis Date   Anemia    Ascending aorta dilatation (HCC) 08/08/2021   a.) TTE 08/08/2021: measured up to 4.0 cm   Beta thalassemia minor    CAD (coronary artery disease) 04/18/2015   a.) LHC 04/18/2015: 80% dLCx, 99% OM2, 70% D1-1, 99% D1-2, 60% mLAD, 70 m-dLAD, 75% dLAD, 100% pRCA -- CVTS consult; b.) s/p 4v CABG 04/21/2015   Dupuytren's contracture of both hands    a.) s/p release (LEFT) in ~2000   Dyspnea    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 Mod inf and anteroapical mixed perfusion defect concerning for ischemia   Laceration of left knee    LBBB (left bundle branch block)    Long-term use of aspirin therapy    NSTEMI (non-ST elevated myocardial infarction) (HCC) 04/16/2015   a.) troponins trended: 0.06 --> 0.35 --> 0.89 ng/L; b.) 80% dLCx, 99% OM2, 70% D1-1, 99% D1-2, 60% mLAD, 70 m-dLAD, 75% dLAD, 100% pRCA -- CVTS consult; c.) s/p 4v CABG 04/21/2015   Postoperative atrial fibrillation (HCC) 04/23/2015   a.) POD-2 following CABG -- OOB to BR when atrial arrythmia began (had RVR) --> started on IV amiodarone and converted to NSR   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   a.) LIMA-LAD, SVG-PD, SVG-diagonal, SVG-LCx    Stage 3a chronic kidney disease (CKD) (HCC)    Thrombocytopenia (HCC)    Type II diabetes mellitus (HCC)    Unstable angina (HCC)    Ventricular bigeminy    Past Surgical History:  Procedure Laterality Date   CARDIAC CATHETERIZATION N/A 04/18/2015   Procedure: Left Heart Cath and Coronary Angiography;  Surgeon: Alwyn Pea, MD;  Location: [facility] INVASIVE CV LAB;  Service: Cardiovascular;  Laterality: N/A;   CATARACT EXTRACTION W/ INTRAOCULAR LENS IMPLANT Right 07/14/2017   CATARACT EXTRACTION W/ INTRAOCULAR LENS IMPLANT Left 07/28/2017   COLONOSCOPY  2014   cleared for 10 yrs   CORONARY ARTERY BYPASS GRAFT N/A 04/21/2015   Procedure: CORONARY ARTERY BYPASS GRAFTING (CABG) X 4 UTILIZING THE LEFT INTERNAL MAMMARY ARTERY TO LAD, ENDOSCOPICALLY HARVESTED RIGHT SAPHENEOUS VEINS TO PD,DIAGONAL AND CIRCUMFLEX.;  Surgeon: Delight Ovens, MD;  Location: [facility] OR;  Service: Open Heart Surgery;  Laterality: N/A;   DUPUYTREN CONTRACTURE RELEASE Left ~ 2000   DUPUYTREN CONTRACTURE RELEASE Right 11/06/2022   Procedure: EXCISION OF DUPUYTREN'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 RING FINGERS;  Surgeon: Christena Flake, MD;  Location: [facility] ORS;  Service: Orthopedics;  Laterality: Right;   INCISION AND DRAINAGE Left 07/18/2020   Procedure: INCISION AND DRAINAGE LEFT KNEE;  Surgeon: Ross Marcus, MD;  necessary  REHAB POTENTIAL: Good  EVALUATION COMPLEXITY: Low      PLAN:  OT FREQUENCY: 1-2x/week  OT DURATION: 8 weeks  PLANNED INTERVENTIONS: therapeutic exercise, manual therapy, scar mobilization, passive range of motion, splinting, paraffin, fluidotherapy, contrast bath, patient/family education, and DME and/or AE instructions, ADL's     CONSULTED AND AGREED WITH PLAN OF CARE:      Oletta Cohn, OTR/L,CLT 01/07/2023, 5:23 PM  [facility] [address], [place], [address] Phone: [phone]   Fax:  [phone]  Patient Details  Name: [name] MRN: [id number] Date of Birth: 08-26-1945 Referring Provider:  Tera Partridge, PA  Encounter Date: 01/07/2023   Oletta Cohn, OT 01/07/2023, 5:23 PM  [facility] [address], [place], [address] Phone: [phone]    Fax:  [phone]

## 2023-01-17 ENCOUNTER — Other Ambulatory Visit: Payer: Self-pay | Admitting: Family Medicine

## 2023-01-17 DIAGNOSIS — E1159 Type 2 diabetes mellitus with other circulatory complications: Secondary | ICD-10-CM

## 2023-01-20 ENCOUNTER — Ambulatory Visit: Payer: Medicare Other | Admitting: Occupational Therapy

## 2023-01-20 DIAGNOSIS — M25642 Stiffness of left hand, not elsewhere classified: Secondary | ICD-10-CM | POA: Diagnosis not present

## 2023-01-20 DIAGNOSIS — R600 Localized edema: Secondary | ICD-10-CM

## 2023-01-20 DIAGNOSIS — M6281 Muscle weakness (generalized): Secondary | ICD-10-CM

## 2023-01-20 DIAGNOSIS — L905 Scar conditions and fibrosis of skin: Secondary | ICD-10-CM | POA: Diagnosis not present

## 2023-01-20 NOTE — Therapy (Signed)
OUTPATIENT OCCUPATIONAL THERAPY ORTHO TREATMENT  Patient Name: [name] MRN: [id number] DOB:[birth year], 77 y.o., male Today's Date: 01/20/2023  PCP: Dr Sena Slate PROVIDER: Dr Alan Mulder PA  END OF SESSION:  OT End of Session - 01/20/23 0853     Visit Number 13    Number of Visits 14    Date for OT Re-Evaluation 02/04/23    OT Start Time 0854    OT Stop Time 0933    OT Time Calculation (min) 39 min    Activity Tolerance Patient tolerated treatment well    Behavior During Therapy [facility] for tasks assessed/performed             Past Medical History:  Diagnosis Date   Anemia    Ascending aorta dilatation (HCC) 08/08/2021   a.) TTE 08/08/2021: measured up to 4.0 cm   Beta thalassemia minor    CAD (coronary artery disease) 04/18/2015   a.) LHC 04/18/2015: 80% dLCx, 99% OM2, 70% D1-1, 99% D1-2, 60% mLAD, 70 m-dLAD, 75% dLAD, 100% pRCA -- CVTS consult; b.) s/p 4v CABG 04/21/2015   Dupuytren's contracture of both hands    a.) s/p release (LEFT) in ~2000   Dyspnea    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 Mod inf and anteroapical mixed perfusion defect concerning for ischemia   Laceration of left knee    LBBB (left bundle branch block)    Long-term use of aspirin therapy    NSTEMI (non-ST elevated myocardial infarction) (HCC) 04/16/2015   a.) troponins trended: 0.06 --> 0.35 --> 0.89 ng/L; b.) 80% dLCx, 99% OM2, 70% D1-1, 99% D1-2, 60% mLAD, 70 m-dLAD, 75% dLAD, 100% pRCA -- CVTS consult; c.) s/p 4v CABG 04/21/2015   Postoperative atrial fibrillation (HCC) 04/23/2015   a.) POD-2 following CABG -- OOB to BR when atrial arrythmia began (had RVR) --> started on IV amiodarone and converted to NSR   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   a.) LIMA-LAD, SVG-PD, SVG-diagonal, SVG-LCx    Stage 3a chronic kidney disease (CKD) (HCC)    Thrombocytopenia (HCC)    Type II diabetes mellitus (HCC)    Unstable angina (HCC)    Ventricular bigeminy    Past Surgical History:  Procedure Laterality Date   CARDIAC CATHETERIZATION N/A 04/18/2015   Procedure: Left Heart Cath and Coronary Angiography;  Surgeon: Alwyn Pea, MD;  Location: [facility] INVASIVE CV LAB;  Service: Cardiovascular;  Laterality: N/A;   CATARACT EXTRACTION W/ INTRAOCULAR LENS IMPLANT Right 07/14/2017   CATARACT EXTRACTION W/ INTRAOCULAR LENS IMPLANT Left 07/28/2017   COLONOSCOPY  2014   cleared for 10 yrs   CORONARY ARTERY BYPASS GRAFT N/A 04/21/2015   Procedure: CORONARY ARTERY BYPASS GRAFTING (CABG) X 4 UTILIZING THE LEFT INTERNAL MAMMARY ARTERY TO LAD, ENDOSCOPICALLY HARVESTED RIGHT SAPHENEOUS VEINS TO PD,DIAGONAL AND CIRCUMFLEX.;  Surgeon: Delight Ovens, MD;  Location: [facility] OR;  Service: Open Heart Surgery;  Laterality: N/A;   DUPUYTREN CONTRACTURE RELEASE Left ~ 2000   DUPUYTREN CONTRACTURE RELEASE Right 11/06/2022   Procedure: EXCISION OF DUPUYTREN'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 RING FINGERS;  Surgeon: Christena Flake, MD;  Location: [facility] ORS;  Service: Orthopedics;  Laterality: Right;   INCISION AND DRAINAGE Left 07/18/2020   Procedure: INCISION AND DRAINAGE LEFT KNEE;  Surgeon: Ross Marcus, MD;  OUTPATIENT OCCUPATIONAL THERAPY ORTHO TREATMENT  Patient Name: [name] MRN: [id number] DOB:[birth year], 77 y.o., male Today's Date: 01/20/2023  PCP: Dr Sena Slate PROVIDER: Dr Alan Mulder PA  END OF SESSION:  OT End of Session - 01/20/23 0853     Visit Number 13    Number of Visits 14    Date for OT Re-Evaluation 02/04/23    OT Start Time 0854    OT Stop Time 0933    OT Time Calculation (min) 39 min    Activity Tolerance Patient tolerated treatment well    Behavior During Therapy [facility] for tasks assessed/performed             Past Medical History:  Diagnosis Date   Anemia    Ascending aorta dilatation (HCC) 08/08/2021   a.) TTE 08/08/2021: measured up to 4.0 cm   Beta thalassemia minor    CAD (coronary artery disease) 04/18/2015   a.) LHC 04/18/2015: 80% dLCx, 99% OM2, 70% D1-1, 99% D1-2, 60% mLAD, 70 m-dLAD, 75% dLAD, 100% pRCA -- CVTS consult; b.) s/p 4v CABG 04/21/2015   Dupuytren's contracture of both hands    a.) s/p release (LEFT) in ~2000   Dyspnea    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 Mod inf and anteroapical mixed perfusion defect concerning for ischemia   Laceration of left knee    LBBB (left bundle branch block)    Long-term use of aspirin therapy    NSTEMI (non-ST elevated myocardial infarction) (HCC) 04/16/2015   a.) troponins trended: 0.06 --> 0.35 --> 0.89 ng/L; b.) 80% dLCx, 99% OM2, 70% D1-1, 99% D1-2, 60% mLAD, 70 m-dLAD, 75% dLAD, 100% pRCA -- CVTS consult; c.) s/p 4v CABG 04/21/2015   Postoperative atrial fibrillation (HCC) 04/23/2015   a.) POD-2 following CABG -- OOB to BR when atrial arrythmia began (had RVR) --> started on IV amiodarone and converted to NSR   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   a.) LIMA-LAD, SVG-PD, SVG-diagonal, SVG-LCx    Stage 3a chronic kidney disease (CKD) (HCC)    Thrombocytopenia (HCC)    Type II diabetes mellitus (HCC)    Unstable angina (HCC)    Ventricular bigeminy    Past Surgical History:  Procedure Laterality Date   CARDIAC CATHETERIZATION N/A 04/18/2015   Procedure: Left Heart Cath and Coronary Angiography;  Surgeon: Alwyn Pea, MD;  Location: [facility] INVASIVE CV LAB;  Service: Cardiovascular;  Laterality: N/A;   CATARACT EXTRACTION W/ INTRAOCULAR LENS IMPLANT Right 07/14/2017   CATARACT EXTRACTION W/ INTRAOCULAR LENS IMPLANT Left 07/28/2017   COLONOSCOPY  2014   cleared for 10 yrs   CORONARY ARTERY BYPASS GRAFT N/A 04/21/2015   Procedure: CORONARY ARTERY BYPASS GRAFTING (CABG) X 4 UTILIZING THE LEFT INTERNAL MAMMARY ARTERY TO LAD, ENDOSCOPICALLY HARVESTED RIGHT SAPHENEOUS VEINS TO PD,DIAGONAL AND CIRCUMFLEX.;  Surgeon: Delight Ovens, MD;  Location: [facility] OR;  Service: Open Heart Surgery;  Laterality: N/A;   DUPUYTREN CONTRACTURE RELEASE Left ~ 2000   DUPUYTREN CONTRACTURE RELEASE Right 11/06/2022   Procedure: EXCISION OF DUPUYTREN'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 RING FINGERS;  Surgeon: Christena Flake, MD;  Location: [facility] ORS;  Service: Orthopedics;  Laterality: Right;   INCISION AND DRAINAGE Left 07/18/2020   Procedure: INCISION AND DRAINAGE LEFT KNEE;  Surgeon: Ross Marcus, MD;  OUTPATIENT OCCUPATIONAL THERAPY ORTHO TREATMENT  Patient Name: [name] MRN: [id number] DOB:[birth year], 77 y.o., male Today's Date: 01/20/2023  PCP: Dr Sena Slate PROVIDER: Dr Alan Mulder PA  END OF SESSION:  OT End of Session - 01/20/23 0853     Visit Number 13    Number of Visits 14    Date for OT Re-Evaluation 02/04/23    OT Start Time 0854    OT Stop Time 0933    OT Time Calculation (min) 39 min    Activity Tolerance Patient tolerated treatment well    Behavior During Therapy [facility] for tasks assessed/performed             Past Medical History:  Diagnosis Date   Anemia    Ascending aorta dilatation (HCC) 08/08/2021   a.) TTE 08/08/2021: measured up to 4.0 cm   Beta thalassemia minor    CAD (coronary artery disease) 04/18/2015   a.) LHC 04/18/2015: 80% dLCx, 99% OM2, 70% D1-1, 99% D1-2, 60% mLAD, 70 m-dLAD, 75% dLAD, 100% pRCA -- CVTS consult; b.) s/p 4v CABG 04/21/2015   Dupuytren's contracture of both hands    a.) s/p release (LEFT) in ~2000   Dyspnea    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 Mod inf and anteroapical mixed perfusion defect concerning for ischemia   Laceration of left knee    LBBB (left bundle branch block)    Long-term use of aspirin therapy    NSTEMI (non-ST elevated myocardial infarction) (HCC) 04/16/2015   a.) troponins trended: 0.06 --> 0.35 --> 0.89 ng/L; b.) 80% dLCx, 99% OM2, 70% D1-1, 99% D1-2, 60% mLAD, 70 m-dLAD, 75% dLAD, 100% pRCA -- CVTS consult; c.) s/p 4v CABG 04/21/2015   Postoperative atrial fibrillation (HCC) 04/23/2015   a.) POD-2 following CABG -- OOB to BR when atrial arrythmia began (had RVR) --> started on IV amiodarone and converted to NSR   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   a.) LIMA-LAD, SVG-PD, SVG-diagonal, SVG-LCx    Stage 3a chronic kidney disease (CKD) (HCC)    Thrombocytopenia (HCC)    Type II diabetes mellitus (HCC)    Unstable angina (HCC)    Ventricular bigeminy    Past Surgical History:  Procedure Laterality Date   CARDIAC CATHETERIZATION N/A 04/18/2015   Procedure: Left Heart Cath and Coronary Angiography;  Surgeon: Alwyn Pea, MD;  Location: [facility] INVASIVE CV LAB;  Service: Cardiovascular;  Laterality: N/A;   CATARACT EXTRACTION W/ INTRAOCULAR LENS IMPLANT Right 07/14/2017   CATARACT EXTRACTION W/ INTRAOCULAR LENS IMPLANT Left 07/28/2017   COLONOSCOPY  2014   cleared for 10 yrs   CORONARY ARTERY BYPASS GRAFT N/A 04/21/2015   Procedure: CORONARY ARTERY BYPASS GRAFTING (CABG) X 4 UTILIZING THE LEFT INTERNAL MAMMARY ARTERY TO LAD, ENDOSCOPICALLY HARVESTED RIGHT SAPHENEOUS VEINS TO PD,DIAGONAL AND CIRCUMFLEX.;  Surgeon: Delight Ovens, MD;  Location: [facility] OR;  Service: Open Heart Surgery;  Laterality: N/A;   DUPUYTREN CONTRACTURE RELEASE Left ~ 2000   DUPUYTREN CONTRACTURE RELEASE Right 11/06/2022   Procedure: EXCISION OF DUPUYTREN'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 RING FINGERS;  Surgeon: Christena Flake, MD;  Location: [facility] ORS;  Service: Orthopedics;  Laterality: Right;   INCISION AND DRAINAGE Left 07/18/2020   Procedure: INCISION AND DRAINAGE LEFT KNEE;  Surgeon: Ross Marcus, MD;  OUTPATIENT OCCUPATIONAL THERAPY ORTHO TREATMENT  Patient Name: [name] MRN: [id number] DOB:[birth year], 77 y.o., male Today's Date: 01/20/2023  PCP: Dr Sena Slate PROVIDER: Dr Alan Mulder PA  END OF SESSION:  OT End of Session - 01/20/23 0853     Visit Number 13    Number of Visits 14    Date for OT Re-Evaluation 02/04/23    OT Start Time 0854    OT Stop Time 0933    OT Time Calculation (min) 39 min    Activity Tolerance Patient tolerated treatment well    Behavior During Therapy [facility] for tasks assessed/performed             Past Medical History:  Diagnosis Date   Anemia    Ascending aorta dilatation (HCC) 08/08/2021   a.) TTE 08/08/2021: measured up to 4.0 cm   Beta thalassemia minor    CAD (coronary artery disease) 04/18/2015   a.) LHC 04/18/2015: 80% dLCx, 99% OM2, 70% D1-1, 99% D1-2, 60% mLAD, 70 m-dLAD, 75% dLAD, 100% pRCA -- CVTS consult; b.) s/p 4v CABG 04/21/2015   Dupuytren's contracture of both hands    a.) s/p release (LEFT) in ~2000   Dyspnea    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 Mod inf and anteroapical mixed perfusion defect concerning for ischemia   Laceration of left knee    LBBB (left bundle branch block)    Long-term use of aspirin therapy    NSTEMI (non-ST elevated myocardial infarction) (HCC) 04/16/2015   a.) troponins trended: 0.06 --> 0.35 --> 0.89 ng/L; b.) 80% dLCx, 99% OM2, 70% D1-1, 99% D1-2, 60% mLAD, 70 m-dLAD, 75% dLAD, 100% pRCA -- CVTS consult; c.) s/p 4v CABG 04/21/2015   Postoperative atrial fibrillation (HCC) 04/23/2015   a.) POD-2 following CABG -- OOB to BR when atrial arrythmia began (had RVR) --> started on IV amiodarone and converted to NSR   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   a.) LIMA-LAD, SVG-PD, SVG-diagonal, SVG-LCx    Stage 3a chronic kidney disease (CKD) (HCC)    Thrombocytopenia (HCC)    Type II diabetes mellitus (HCC)    Unstable angina (HCC)    Ventricular bigeminy    Past Surgical History:  Procedure Laterality Date   CARDIAC CATHETERIZATION N/A 04/18/2015   Procedure: Left Heart Cath and Coronary Angiography;  Surgeon: Alwyn Pea, MD;  Location: [facility] INVASIVE CV [facility];  Service: Cardiovascular;  Laterality: N/A;   CATARACT EXTRACTION W/ INTRAOCULAR LENS IMPLANT Right 07/14/2017   CATARACT EXTRACTION W/ INTRAOCULAR LENS IMPLANT Left 07/28/2017   COLONOSCOPY  2014   cleared for 10 yrs   CORONARY ARTERY BYPASS GRAFT N/A 04/21/2015   Procedure: CORONARY ARTERY BYPASS GRAFTING (CABG) X 4 UTILIZING THE LEFT INTERNAL MAMMARY ARTERY TO LAD, ENDOSCOPICALLY HARVESTED RIGHT SAPHENEOUS VEINS TO PD,DIAGONAL AND CIRCUMFLEX.;  Surgeon: Delight Ovens, MD;  Location: [facility] OR;  Service: Open Heart Surgery;  Laterality: N/A;   DUPUYTREN CONTRACTURE RELEASE Left ~ 2000   DUPUYTREN CONTRACTURE RELEASE Right 11/06/2022   Procedure: EXCISION OF DUPUYTREN'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 RING FINGERS;  Surgeon: Christena Flake, MD;  Location: [facility] ORS;  Service: Orthopedics;  Laterality: Right;   INCISION AND DRAINAGE Left 07/18/2020   Procedure: INCISION AND DRAINAGE LEFT KNEE;  Surgeon: Ross Marcus, MD;  performance. Patient will benefit from skilled OT to address above impairments and improve overall function.  MODIFICATION OR ASSISTANCE TO COMPLETE EVALUATION: No modification of tasks or assist necessary to complete an evaluation.  OT OCCUPATIONAL PROFILE AND HISTORY: Problem focused assessment: Including review of records relating to presenting problem.  CLINICAL DECISION MAKING: LOW - limited treatment options, no task modification necessary  REHAB POTENTIAL: Good  EVALUATION COMPLEXITY: Low      PLAN:  OT FREQUENCY:biweekly  OT DURATION: 8 weeks  PLANNED INTERVENTIONS: therapeutic exercise, manual therapy, scar mobilization, passive range of motion, splinting, paraffin, fluidotherapy, contrast bath, patient/family education, and DME and/or AE instructions, ADL's     CONSULTED AND AGREED WITH PLAN OF CARE:      [name], [name],[name] 01/20/2023, 9:47 AM  [facility] [address] [place], [address] Phone: [phone]   Fax:  [phone]  Patient Details  Name: [name] MRN: [id number] Date of Birth: 1946/03/12 Referring Provider:  Tera Partridge, PA  Encounter Date: 01/20/2023   [name], [name],[name] 01/20/2023, 9:47 AM  [facility] [address] [place], [address] Phone: [phone]   Fax:  [phone]

## 2023-01-24 ENCOUNTER — Encounter: Payer: Self-pay | Admitting: Family Medicine

## 2023-01-24 ENCOUNTER — Ambulatory Visit (INDEPENDENT_AMBULATORY_CARE_PROVIDER_SITE_OTHER): Payer: Medicare Other | Admitting: Family Medicine

## 2023-01-24 VITALS — BP 100/60 | HR 54 | Ht 70.0 in | Wt 195.0 lb

## 2023-01-24 DIAGNOSIS — Z7984 Long term (current) use of oral hypoglycemic drugs: Secondary | ICD-10-CM

## 2023-01-24 DIAGNOSIS — E119 Type 2 diabetes mellitus without complications: Secondary | ICD-10-CM | POA: Diagnosis not present

## 2023-01-24 DIAGNOSIS — E1169 Type 2 diabetes mellitus with other specified complication: Secondary | ICD-10-CM | POA: Diagnosis not present

## 2023-01-24 DIAGNOSIS — E785 Hyperlipidemia, unspecified: Secondary | ICD-10-CM | POA: Diagnosis not present

## 2023-01-24 MED ORDER — ATORVASTATIN CALCIUM 40 MG PO TABS
40.0000 mg | ORAL_TABLET | Freq: Every day | ORAL | 1 refills | Status: AC
Start: 2023-01-24 — End: ?

## 2023-01-24 MED ORDER — GLIPIZIDE 10 MG PO TABS
ORAL_TABLET | ORAL | 0 refills | Status: DC
Start: 2023-01-24 — End: 2023-07-17

## 2023-01-24 MED ORDER — METFORMIN HCL 500 MG PO TABS
500.0000 mg | ORAL_TABLET | Freq: Two times a day (BID) | ORAL | 1 refills | Status: DC
Start: 2023-01-24 — End: 2023-08-14

## 2023-01-24 NOTE — Patient Instructions (Signed)

## 2023-01-24 NOTE — Progress Notes (Signed)
Date:  01/24/2023   Name:  [name]   DOB:  1946-04-07   MRN:  [id number]   Chief Complaint: No chief complaint on file.  Diabetes He presents for his follow-up diabetic visit. He has type 2 diabetes mellitus. His disease course has been stable. Pertinent negatives for diabetes include no chest pain, no fatigue, no foot paresthesias, no foot ulcerations, no polyphagia, no polyuria and no weight loss. Symptoms are stable.  Hyperlipidemia This is a chronic problem. The problem is controlled. Recent lipid tests were reviewed and are normal. He has no history of chronic renal disease, diabetes, hypothyroidism, liver disease, obesity or nephrotic syndrome. Pertinent negatives include no chest pain, focal sensory loss, focal weakness, leg pain, myalgias or shortness of breath. Current antihyperlipidemic treatment includes statins. The current treatment provides moderate improvement of lipids. There are no compliance problems.     Lab Results  Component Value Date   NA 139 01/22/2022   K 4.9 01/22/2022   CO2 21 01/22/2022   GLUCOSE 164 (H) 01/22/2022   BUN 27 (A) 03/28/2022   CREATININE 1.4 (A) 03/28/2022   CALCIUM 9.9 01/22/2022   EGFR 40 (L) 01/22/2022   GFRNONAA 40 (L) 07/19/2020   Lab Results  Component Value Date   CHOL 94 04/26/2022   HDL 37 04/26/2022   LDLCALC 40 04/26/2022   TRIG 84 04/26/2022   CHOLHDL 4.6 11/05/2017   Lab Results  Component Value Date   TSH 1.00 02/15/2022   TSH 3.48 02/15/2022   Lab Results  Component Value Date   HGBA1C 7.3 04/26/2022   Lab Results  Component Value Date   WBC 6.3 07/19/2020   HGB 9.4 (L) 07/19/2020   HCT 29.9 (L) 07/19/2020   MCV 62.7 (L) 07/19/2020   PLT 104 (L) 07/19/2020   Lab Results  Component Value Date   ALT 22 01/22/2022   AST 20 01/22/2022   ALKPHOS 84 01/22/2022   BILITOT 1.1 01/22/2022   No results found for: "25OHVITD2", "25OHVITD3", "VD25OH"   Review of Systems  Constitutional:  Negative for  fatigue, fever and weight loss.  Eyes:  Negative for visual disturbance.  Respiratory:  Negative for cough, chest tightness, shortness of breath, wheezing and stridor.   Cardiovascular:  Negative for chest pain, palpitations and leg swelling.  Endocrine: Negative for polyphagia and polyuria.  Musculoskeletal:  Negative for myalgias.  Neurological:  Negative for focal weakness.    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   Presence of aortocoronary bypass graft 04/21/2015   Thrombocytopenia (HCC) 04/20/2015   Coronary artery disease 04/18/2015   Unstable angina (HCC) 04/17/2015   Elevated troponin 04/17/2015   DM type 2 causing CKD stage 3 (HCC) 04/17/2015   Essential hypertension 04/17/2015    No Known Allergies  Past Surgical History:  Procedure Laterality Date   CARDIAC CATHETERIZATION N/A 04/18/2015   Procedure: Left Heart Cath and  Coronary Angiography;  Surgeon: Alwyn Pea, MD;  Location: [facility];  Service: Cardiovascular;  Laterality: N/A;   CATARACT EXTRACTION W/ INTRAOCULAR LENS IMPLANT Right 07/14/2017   CATARACT EXTRACTION W/ INTRAOCULAR LENS IMPLANT Left 07/28/2017   COLONOSCOPY  2014   cleared for 10 yrs   CORONARY ARTERY BYPASS GRAFT N/A 04/21/2015   Procedure: CORONARY ARTERY BYPASS GRAFTING (CABG) X 4 UTILIZING THE LEFT INTERNAL MAMMARY ARTERY TO LAD, ENDOSCOPICALLY HARVESTED RIGHT  SAPHENEOUS VEINS TO PD,DIAGONAL AND CIRCUMFLEX.;  Surgeon: Delight Ovens, MD;  Location: [facility] OR;  Service: Open Heart Surgery;  Laterality: N/A;   DUPUYTREN CONTRACTURE RELEASE Left ~ 2000   DUPUYTREN CONTRACTURE RELEASE Right 11/06/2022   Procedure: EXCISION OF DUPUYTREN'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 RING FINGERS;  Surgeon: Christena Flake, MD;  Location: [facility];  Service: Orthopedics;  Laterality: Right;   INCISION AND DRAINAGE Left 07/18/2020   Procedure: INCISION AND DRAINAGE LEFT KNEE;  Surgeon: Ross Marcus, MD;  Location: [facility];  Service: Orthopedics;  Laterality: Left;   KNEE ARTHROSCOPY Right ~ 1995   TEE WITHOUT CARDIOVERSION N/A 04/21/2015   Procedure: TRANSESOPHAGEAL ECHOCARDIOGRAM (TEE);  Surgeon: Delight Ovens, MD;  Location: [facility] OR;  Service: Open Heart Surgery;  Laterality: N/A;    Social History   Tobacco Use   Smoking status: Never   Smokeless tobacco: Never  Vaping Use   Vaping status: Never Used  Substance Use Topics   Alcohol use: Not Currently    Alcohol/week: 0.0 standard drinks of alcohol   Drug use: No     Medication list has been reviewed and updated.  Current Meds  Medication Sig   acetaminophen (TYLENOL) 500 MG tablet Take 2 tablets (1,000 mg total) by mouth every 8 (eight) hours.   aspirin 81 MG tablet Take 1 tablet (81 mg total) by mouth daily.   atorvastatin (LIPITOR) 40 MG tablet Take 1 tablet (40 mg total) by mouth daily.   dapagliflozin propanediol (FARXIGA) 10 MG TABS tablet Take 10 mg by mouth daily. lateef   FREESTYLE LITE test strip USE TO TEST ONCE DAILY   glipiZIDE (GLUCOTROL) 10 MG tablet TAKE 1 TABLET (10 MG TOTAL) BY MOUTH TWICE A DAY BEFORE A MEAL   isosorbide mononitrate (IMDUR) 60 MG 24 hr tablet Take 60 mg by mouth daily. cardio   losartan (COZAAR) 50 MG tablet Take 0.5 tablets (25 mg total) by mouth daily.   metFORMIN (GLUCOPHAGE) 500 MG tablet TAKE 1 TABLET BY MOUTH 2 TIMES DAILY WITH A MEAL.   metoprolol  succinate (TOPROL-XL) 25 MG 24 hr tablet Take 12.5 mg by mouth at bedtime.   Study - EVOLVE-MI - evolocumab (REPATHA) 140 mg/mL SQ injection (PI-Stuckey) Inject 140 mg into the skin every 14 (fourteen) days.       07/24/2022   10:37 AM 01/22/2022   10:19 AM 07/20/2021    9:11 AM 08/24/2020    8:14 AM  GAD 7 : Generalized Anxiety Score  Nervous, Anxious, on Edge 0 0 0 0  Control/stop worrying 0 0 0 0  Worry too much - different things 0 0 0 0  Trouble relaxing 0 0 0 0  Restless 0 0 0 0  Easily annoyed or irritable 0 0 0 0  Afraid - awful might happen 0 0 0 0  Total GAD 7 Score 0 0 0 0  Anxiety Difficulty Not difficult at all Not difficult at all Not difficult at all Not difficult at all  07/24/2022   10:37 AM 05/16/2022   10:00 AM 01/22/2022   10:19 AM  Depression screen PHQ 2/9  Decreased Interest 0 0 0  Down, Depressed, Hopeless 0 0 0  PHQ - 2 Score 0 0 0  Altered sleeping 0 0 0  Tired, decreased energy 0 0 0  Change in appetite 0 0 0  Feeling bad or failure about yourself  0 0 0  Trouble concentrating 0 0 0  Moving slowly or fidgety/restless 0 0 0  Suicidal thoughts 0 0 0  PHQ-9 Score 0 0 0  Difficult doing work/chores Not difficult at all Not difficult at all Not difficult at all    BP Readings from Last 3 Encounters:  01/24/23 100/60  11/06/22 117/61  07/24/22 112/62    Physical Exam Vitals and nursing note reviewed.  HENT:     Head: Normocephalic.     Right Ear: External ear normal.     Left Ear: External ear normal.     Nose: Nose normal.  Eyes:     General: No scleral icterus.       Right eye: No discharge.        Left eye: No discharge.     Conjunctiva/sclera: Conjunctivae normal.     Pupils: Pupils are equal, round, and reactive to light.  Neck:     Thyroid: No thyromegaly.     Vascular: No JVD.     Trachea: No tracheal deviation.  Cardiovascular:     Rate and Rhythm: Normal rate and regular rhythm.     Heart sounds: Normal heart sounds. No  murmur heard.    No friction rub. No gallop.  Pulmonary:     Effort: No respiratory distress.     Breath sounds: Normal breath sounds. No wheezing, rhonchi or rales.  Abdominal:     General: Bowel sounds are normal.     Palpations: Abdomen is soft. There is no mass.     Tenderness: There is no abdominal tenderness. There is no guarding or rebound.  Musculoskeletal:        General: No tenderness. Normal range of motion.     Cervical back: Normal range of motion and neck supple.  Lymphadenopathy:     Cervical: No cervical adenopathy.  Skin:    General: Skin is warm.     Findings: No rash.  Neurological:     Mental Status: He is alert and oriented to person, place, and time.     Cranial Nerves: No cranial nerve deficit.     Deep Tendon Reflexes: Reflexes are normal and symmetric.     Wt Readings from Last 3 Encounters:  01/24/23 195 lb (88.5 kg)  11/06/22 199 lb (90.3 kg)  10/30/22 199 lb (90.3 kg)    BP 100/60 (BP Location: Left Arm, Cuff Size: Large)   Pulse (!) 54   Ht 5\' 10"  (1.778 m)   Wt 195 lb (88.5 kg)   SpO2 95%   BMI 27.98 kg/m   Assessment and Plan:  1. Diabetes mellitus treated with oral medication (HCC) Chronic.  Controlled.  Stable.  Patient is doing excellent with his dietary and we will check renal function panel A1c to see if maybe I am controlling his diabetes a little too closely.  In the meantime we are looking at continuance of glipizide 10 mg once a day and metformin 500 mg twice a day.  Will recheck patient in 4 months. - Renal Function Panel - Hemoglobin A1c - glipiZIDE (GLUCOTROL) 10  MG tablet; TAKE 1 TABLET (10 MG TOTAL) BY MOUTH TWICE A DAY BEFORE A MEAL  Dispense: 180 tablet; Refill: 0 - metFORMIN (GLUCOPHAGE) 500 MG tablet; Take 1 tablet (500 mg total) by mouth 2 (two) times daily with a meal.  Dispense: 180 tablet; Refill: 1  2. Hyperlipidemia associated with type 2 diabetes mellitus (HCC) Chronic.  Controlled.  Stable.  Patient has been  given a low-cholesterol dietary guidelines.  Will check lipid panel for current level of control and likely continue atorvastatin 40 mg once a day. - Lipid Panel With LDL/HDL Ratio - atorvastatin (LIPITOR) 40 MG tablet; Take 1 tablet (40 mg total) by mouth daily.  Dispense: 90 tablet; Refill: 1    Elizabeth Sauer, MD

## 2023-01-25 LAB — HEMOGLOBIN A1C
Est. average glucose Bld gHb Est-mCnc: 151 mg/dL
Hgb A1c MFr Bld: 6.9 % — ABNORMAL HIGH (ref 4.8–5.6)

## 2023-01-25 LAB — RENAL FUNCTION PANEL
Albumin: 4.5 g/dL (ref 3.8–4.8)
BUN/Creatinine Ratio: 14 (ref 10–24)
BUN: 26 mg/dL (ref 8–27)
CO2: 22 mmol/L (ref 20–29)
Calcium: 9.7 mg/dL (ref 8.6–10.2)
Chloride: 104 mmol/L (ref 96–106)
Creatinine, Ser: 1.83 mg/dL — ABNORMAL HIGH (ref 0.76–1.27)
Glucose: 134 mg/dL — ABNORMAL HIGH (ref 70–99)
Phosphorus: 2.9 mg/dL (ref 2.8–4.1)
Potassium: 5.3 mmol/L — ABNORMAL HIGH (ref 3.5–5.2)
Sodium: 141 mmol/L (ref 134–144)
eGFR: 38 mL/min/{1.73_m2} — ABNORMAL LOW (ref >59)

## 2023-01-25 LAB — LIPID PANEL WITH LDL/HDL RATIO
Cholesterol, Total: 75 mg/dL — ABNORMAL LOW (ref 100–199)
HDL: 38 mg/dL — ABNORMAL LOW (ref >39)
LDL Chol Calc (NIH): 16 mg/dL (ref 0–99)
LDL/HDL Ratio: 0.4 {ratio} (ref 0.0–3.6)
Triglycerides: 118 mg/dL (ref 0–149)
VLDL Cholesterol Cal: 21 mg/dL (ref 5–40)

## 2023-01-27 DIAGNOSIS — M11261 Other chondrocalcinosis, right knee: Secondary | ICD-10-CM | POA: Diagnosis not present

## 2023-01-27 DIAGNOSIS — M1711 Unilateral primary osteoarthritis, right knee: Secondary | ICD-10-CM | POA: Diagnosis not present

## 2023-01-29 DIAGNOSIS — E1159 Type 2 diabetes mellitus with other circulatory complications: Secondary | ICD-10-CM | POA: Diagnosis not present

## 2023-01-29 DIAGNOSIS — I25810 Atherosclerosis of coronary artery bypass graft(s) without angina pectoris: Secondary | ICD-10-CM | POA: Diagnosis not present

## 2023-01-29 DIAGNOSIS — I10 Essential (primary) hypertension: Secondary | ICD-10-CM | POA: Diagnosis not present

## 2023-01-29 DIAGNOSIS — R0602 Shortness of breath: Secondary | ICD-10-CM | POA: Diagnosis not present

## 2023-01-29 DIAGNOSIS — N1831 Chronic kidney disease, stage 3a: Secondary | ICD-10-CM | POA: Diagnosis not present

## 2023-01-29 DIAGNOSIS — E7800 Pure hypercholesterolemia, unspecified: Secondary | ICD-10-CM | POA: Diagnosis not present

## 2023-01-29 DIAGNOSIS — I25118 Atherosclerotic heart disease of native coronary artery with other forms of angina pectoris: Secondary | ICD-10-CM | POA: Diagnosis not present

## 2023-01-29 DIAGNOSIS — I2510 Atherosclerotic heart disease of native coronary artery without angina pectoris: Secondary | ICD-10-CM | POA: Diagnosis not present

## 2023-01-29 DIAGNOSIS — I447 Left bundle-branch block, unspecified: Secondary | ICD-10-CM | POA: Diagnosis not present

## 2023-02-03 ENCOUNTER — Ambulatory Visit: Payer: Medicare Other | Admitting: Occupational Therapy

## 2023-02-03 DIAGNOSIS — Z23 Encounter for immunization: Secondary | ICD-10-CM | POA: Diagnosis not present

## 2023-02-10 DIAGNOSIS — M720 Palmar fascial fibromatosis [Dupuytren]: Secondary | ICD-10-CM | POA: Diagnosis not present

## 2023-04-17 DIAGNOSIS — N1832 Chronic kidney disease, stage 3b: Secondary | ICD-10-CM | POA: Diagnosis not present

## 2023-04-17 DIAGNOSIS — E1122 Type 2 diabetes mellitus with diabetic chronic kidney disease: Secondary | ICD-10-CM | POA: Diagnosis not present

## 2023-04-17 DIAGNOSIS — D631 Anemia in chronic kidney disease: Secondary | ICD-10-CM | POA: Diagnosis not present

## 2023-04-17 DIAGNOSIS — I10 Essential (primary) hypertension: Secondary | ICD-10-CM | POA: Diagnosis not present

## 2023-04-19 ENCOUNTER — Other Ambulatory Visit: Payer: Self-pay | Admitting: Family Medicine

## 2023-04-19 DIAGNOSIS — E1159 Type 2 diabetes mellitus with other circulatory complications: Secondary | ICD-10-CM

## 2023-04-28 DIAGNOSIS — M1711 Unilateral primary osteoarthritis, right knee: Secondary | ICD-10-CM | POA: Diagnosis not present

## 2023-04-28 DIAGNOSIS — M11261 Other chondrocalcinosis, right knee: Secondary | ICD-10-CM | POA: Diagnosis not present

## 2023-05-28 ENCOUNTER — Ambulatory Visit (INDEPENDENT_AMBULATORY_CARE_PROVIDER_SITE_OTHER): Payer: Medicare Other

## 2023-05-28 DIAGNOSIS — Z0000 Encounter for general adult medical examination without abnormal findings: Secondary | ICD-10-CM

## 2023-05-28 NOTE — Progress Notes (Signed)
Subjective:   [name] is a 78 y.o. male who presents for Medicare Annual/Subsequent preventive examination.  Visit Complete: Virtual I connected with  [name] on 05/28/23 by a audio enabled telemedicine application and verified that I am speaking with the correct person using two identifiers.  This patient declined Interactive audio and Acupuncturist. Therefore the visit was completed with audio only.   Patient Location: Home  Provider Location: Office/Clinic  I discussed the limitations of evaluation and management by telemedicine. The patient expressed understanding and agreed to proceed.  Vital Signs: Because this visit was a virtual/telehealth visit, some criteria may be missing or patient reported. Any vitals not documented were not able to be obtained and vitals that have been documented are patient reported.  Cardiac Risk Factors include: advanced age (>30men, >3 women);dyslipidemia;diabetes mellitus;hypertension;male gender;sedentary lifestyle     Objective:    There were no vitals filed for this visit. There is no height or weight on file to calculate BMI.     05/28/2023    2:53 PM 11/06/2022    8:52 AM 10/30/2022   12:45 PM 05/16/2022   10:01 AM 05/07/2021   10:06 AM 07/19/2020    5:49 AM 07/18/2020    3:52 PM  Advanced Directives  Does Patient Have a Medical Advance Directive? Yes Yes Yes Yes Yes Yes Yes  Type of Estate agent of [place];Living will Healthcare Power of [place];Living will Healthcare Power of [place];Living will Healthcare Power of [place];Living will Healthcare Power of [place];Living will Living will Living will  Does patient want to make changes to medical advance directive? No - Patient declined No - Patient declined  No - Patient declined  No - Patient declined   Copy of Healthcare Power of Attorney in Chart? Yes - validated most recent copy scanned in chart (See row information) Yes -  validated most recent copy scanned in chart (See row information) Yes - validated most recent copy scanned in chart (See row information) Yes - validated most recent copy scanned in chart (See row information) Yes - validated most recent copy scanned in chart (See row information)      Current Medications (verified) Outpatient Encounter Medications as of 05/28/2023  Medication Sig   acetaminophen (TYLENOL) 500 MG tablet Take 2 tablets (1,000 mg total) by mouth every 8 (eight) hours.   aspirin 81 MG tablet Take 1 tablet (81 mg total) by mouth daily.   atorvastatin (LIPITOR) 40 MG tablet Take 1 tablet (40 mg total) by mouth daily.   dapagliflozin propanediol (FARXIGA) 10 MG TABS tablet Take 10 mg by mouth daily. lateef   FREESTYLE LITE test strip USE TO TEST ONCE DAILY   HYDROcodone-acetaminophen (NORCO/VICODIN) 5-325 MG tablet Take 1-2 tablets by mouth every 6 (six) hours as needed for severe pain or moderate pain.   isosorbide mononitrate (IMDUR) 60 MG 24 hr tablet Take 60 mg by mouth daily. cardio   losartan (COZAAR) 50 MG tablet Take 0.5 tablets (25 mg total) by mouth daily.   metFORMIN (GLUCOPHAGE) 500 MG tablet Take 1 tablet (500 mg total) by mouth 2 (two) times daily with a meal.   metoprolol succinate (TOPROL-XL) 25 MG 24 hr tablet Take 12.5 mg by mouth at bedtime.   Study - EVOLVE-MI - evolocumab (REPATHA) 140 mg/mL SQ injection (PI-Stuckey) Inject 140 mg into the skin every 14 (fourteen) days.   glipiZIDE (GLUCOTROL) 10 MG tablet TAKE 1 TABLET (10 MG TOTAL) BY MOUTH TWICE A DAY BEFORE  A MEAL   No facility-administered encounter medications on file as of 05/28/2023.    Allergies (verified) Patient has no known allergies.   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 Mod inf and anteroapical mixed perfusion defect concerning for ischemia   Laceration of left knee    LBBB (left bundle branch block)    Long-term use of aspirin therapy    NSTEMI (non-ST elevated myocardial infarction) (HCC) 04/16/2015   a.) troponins trended: 0.06 --> 0.35 --> 0.89 ng/L; b.) 80% dLCx, 99% OM2, 70% D1-1, 99% D1-2, 60% mLAD, 70 m-dLAD, 75% dLAD, 100% pRCA -- CVTS consult; c.) s/p 4v CABG 04/21/2015   Postoperative atrial fibrillation (HCC) 04/23/2015   a.) POD-2 following CABG -- OOB to BR when atrial arrythmia began (had RVR) --> started on IV amiodarone and converted to NSR   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   a.) LIMA-LAD, SVG-PD, SVG-diagonal, SVG-LCx   Stage 3a chronic kidney disease (CKD) (HCC)    Thrombocytopenia (HCC)    Type II diabetes mellitus (HCC)    Unstable angina (HCC)    Ventricular bigeminy    Past Surgical History:  Procedure Laterality Date   CARDIAC CATHETERIZATION N/A 04/18/2015   Procedure: Left Heart Cath and Coronary Angiography;  Surgeon: Alwyn Pea, MD;  Location: [facility] INVASIVE CV LAB;  Service: Cardiovascular;  Laterality: N/A;   CATARACT EXTRACTION W/ INTRAOCULAR LENS IMPLANT Right 07/14/2017   CATARACT EXTRACTION W/ INTRAOCULAR LENS IMPLANT Left 07/28/2017   COLONOSCOPY  2014   cleared for 10 yrs   CORONARY ARTERY BYPASS GRAFT N/A 04/21/2015   Procedure: CORONARY ARTERY BYPASS GRAFTING (CABG) X 4 UTILIZING THE LEFT INTERNAL MAMMARY ARTERY TO LAD, ENDOSCOPICALLY HARVESTED RIGHT SAPHENEOUS VEINS TO PD,DIAGONAL AND  CIRCUMFLEX.;  Surgeon: Delight Ovens, MD;  Location: [facility] OR;  Service: Open Heart Surgery;  Laterality: N/A;   DUPUYTREN CONTRACTURE RELEASE Left ~ 2000   DUPUYTREN CONTRACTURE RELEASE Right 11/06/2022   Procedure: EXCISION OF DUPUYTREN'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 RING FINGERS;  Surgeon: Christena Flake, MD;  Location: [facility] ORS;  Service: Orthopedics;  Laterality: Right;   INCISION AND DRAINAGE Left 07/18/2020   Procedure: INCISION AND DRAINAGE LEFT KNEE;  Surgeon: Ross Marcus, MD;  Location: [facility] ORS;  Service: Orthopedics;  Laterality: Left;   KNEE ARTHROSCOPY Right ~ 1995   TEE WITHOUT CARDIOVERSION N/A 04/21/2015   Procedure: TRANSESOPHAGEAL ECHOCARDIOGRAM (TEE);  Surgeon: Delight Ovens, MD;  Location: [facility];  Service: Open Heart Surgery;  Laterality: N/A;   Family History  Problem Relation Age of Onset   Diabetes Mother    Heart attack Other    Hypertension Other    Social History   Socioeconomic History   Marital status: Married  Spouse name: Dequane Strahan   Number of children: 1   Years of education: Not on file   Highest education level: Not on file  Occupational History   Not on file  Tobacco Use   Smoking status: Never   Smokeless tobacco: Never  Vaping Use   Vaping status: Never Used  Substance and Sexual Activity   Alcohol use: Not Currently    Alcohol/week: 0.0 standard drinks of alcohol   Drug use: No   Sexual activity: Not Currently  Other Topics Concern   Not on file  Social History Narrative   Not on file   Social Drivers of Health   Financial Resource Strain: Low Risk  (05/28/2023)   Overall Financial Resource Strain (CARDIA)    Difficulty of Paying Living Expenses: Not hard at all  Food Insecurity: No Food Insecurity (05/28/2023)   Hunger Vital Sign    Worried About Running Out of Food in the Last Year: Never true    Ran Out of Food in the Last Year: Never true  Transportation Needs: No Transportation Needs (05/28/2023)    PRAPARE - Administrator, Civil Service (Medical): No    Lack of Transportation (Non-Medical): No  Physical Activity: Inactive (05/28/2023)   Exercise Vital Sign    Days of Exercise per Week: 0 days    Minutes of Exercise per Session: 0 min  Stress: No Stress Concern Present (05/28/2023)   [employer] of Occupational Health - Occupational Stress Questionnaire    Feeling of Stress : Not at all  Social Connections: Moderately Integrated (05/28/2023)   Social Connection and Isolation Panel [NHANES]    Frequency of Communication with Friends and Family: More than three times a week    Frequency of Social Gatherings with Friends and Family: More than three times a week    Attends Religious Services: Never    Database administrator or Organizations: Yes    Attends Engineer, structural: More than 4 times per year    Marital Status: Married    Tobacco Counseling Counseling given: Not Answered   Clinical Intake:  Pre-visit preparation completed: Yes  Pain : No/denies pain     BMI - recorded: 28 Nutritional Status: BMI 25 -29 Overweight Nutritional Risks: None Diabetes: Yes CBG done?: No Did pt. bring in CBG monitor from home?: No  How often do you need to have someone help you when you read instructions, pamphlets, or other written materials from your doctor or pharmacy?: 1 - Never  Interpreter Needed?: No  Information entered by :: Kennedy Bucker, LPN   Activities of Daily Living    05/28/2023    2:56 PM 11/06/2022    8:56 AM  In your present state of health, do you have any difficulty performing the following activities:  Hearing? 0 0  Vision? 0 0  Difficulty concentrating or making decisions? 0 0  Walking or climbing stairs? 0 0  Comment TAKES HIS TIME   Dressing or bathing? 0 0  Doing errands, shopping? 0   Preparing Food and eating ? N   Using the Toilet? N   In the past six months, have you accidently leaked urine? N   Do you have  problems with loss of bowel control? N   Managing your Medications? N   Managing your Finances? N   Housekeeping or managing your Housekeeping? N     Patient Care Team: Duanne Limerick, MD as PCP - General (Family Medicine) Harold Hedge  A, MD as Consulting Physician (Cardiology) Mady Haagensen, MD (Internal Medicine)  Indicate any recent Medical Services you may have received from other than [facility] providers in the past year (date may be approximate).     Assessment:   This is a routine wellness examination for [name].  Hearing/Vision screen Hearing Screening - Comments:: NO AIDS Vision Screening - Comments:: [facility]- DR.CHEEK   Goals Addressed             This Visit's Progress    DIET - INCREASE WATER INTAKE         Depression Screen    05/28/2023    2:51 PM 07/24/2022   10:37 AM 05/16/2022   10:00 AM 01/22/2022   10:19 AM 07/20/2021    9:11 AM 05/07/2021   10:05 AM 08/24/2020    8:13 AM  PHQ 2/9 Scores  PHQ - 2 Score 0 0 0 0 0 0 0  PHQ- 9 Score 0 0 0 0 0  0    Fall Risk    05/28/2023    2:55 PM 07/24/2022   10:37 AM 05/16/2022   10:02 AM 01/22/2022   10:18 AM 05/07/2021   10:06 AM  Fall Risk   Falls in the past year? 1 0 0 0 0  Number falls in past yr: 0 0 0 0 0  Injury with Fall? 0 0 0 0 0  Risk for fall due to :  No Fall Risks No Fall Risks No Fall Risks No Fall Risks  Follow up Falls prevention discussed;Falls evaluation completed Falls evaluation completed Falls prevention discussed;Falls evaluation completed Falls evaluation completed Falls prevention discussed    MEDICARE RISK AT HOME: Medicare Risk at Home Any stairs in or around the home?: Yes If so, are there any without handrails?: No Home free of loose throw rugs in walkways, pet beds, electrical cords, etc?: Yes Adequate lighting in your home to reduce risk of falls?: Yes Life alert?: No Use of a cane, walker or w/c?: No Grab bars in the bathroom?: No Shower chair or bench in shower?:  Yes Elevated toilet seat or a handicapped toilet?: No  TIMED UP AND GO:  Was the test performed?  No    Cognitive Function:        05/28/2023    2:57 PM 05/16/2022   10:07 AM 05/03/2019    3:00 PM  6CIT Screen  What Year? 0 points 0 points 0 points  What month? 0 points 0 points 0 points  What time? 0 points 0 points 0 points  Count back from 20 0 points 0 points 0 points  Months in reverse 0 points 0 points 0 points  Repeat phrase 0 points 0 points 0 points  Total Score 0 points 0 points 0 points    Immunizations Immunization History  Administered Date(s) Administered   Fluad Quad(high Dose 65+) 03/08/2022   Influenza, High Dose Seasonal PF 03/03/2017, 01/10/2018   Influenza, Seasonal, Injecte, Preservative Fre 02/03/2023   Influenza-Unspecified 01/01/2019, 01/10/2020, 01/07/2021   PFIZER(Purple Top)SARS-COV-2 Vaccination 05/22/2019, 06/12/2019, 01/18/2020, 12/26/2020   Pfizer(Comirnaty)Fall Seasonal Vaccine 12 years and older 03/08/2022   Pneumococcal Conjugate-13 12/01/2014   Pneumococcal Polysaccharide-23 05/08/2017   Tdap 04/09/2011   Zoster, Live 04/09/2011    TDAP status: Due, Education has been provided regarding the importance of this vaccine. Advised may receive this vaccine at local pharmacy or Health Dept. Aware to provide a copy of the vaccination record if obtained from local pharmacy or Health Dept. Verbalized acceptance and  understanding.  Flu Vaccine status: Up to date  Pneumococcal vaccine status: Up to date  Covid-19 vaccine status: Completed vaccines  Qualifies for Shingles Vaccine? Yes   Zostavax completed Yes   Shingrix Completed?: No.    Education has been provided regarding the importance of this vaccine. Patient has been advised to call insurance company to determine out of pocket expense if they have not yet received this vaccine. Advised may also receive vaccine at local pharmacy or Health Dept. Verbalized acceptance and  understanding.  Screening Tests Health Maintenance  Topic Date Due   Zoster Vaccines- Shingrix (1 of 2) 10/13/1995   COVID-19 Vaccine (6 - 2024-25 season) 12/08/2022   Diabetic kidney evaluation - Urine ACR  03/29/2023   FOOT EXAM  07/24/2023   HEMOGLOBIN A1C  07/25/2023   OPHTHALMOLOGY EXAM  07/30/2023   Diabetic kidney evaluation - eGFR measurement  01/24/2024   Medicare Annual Wellness (AWV)  05/27/2024   Pneumonia Vaccine 61+ Years old  Completed   INFLUENZA VACCINE  Completed   Hepatitis C Screening  Completed   HPV VACCINES  Aged Out   DTaP/Tdap/Td  Discontinued   Colonoscopy  Discontinued    Health Maintenance  Health Maintenance Due  Topic Date Due   Zoster Vaccines- Shingrix (1 of 2) 10/13/1995   COVID-19 Vaccine (6 - 2024-25 season) 12/08/2022   Diabetic kidney evaluation - Urine ACR  03/29/2023    Colorectal cancer screening: No longer required.   Lung Cancer Screening: (Low Dose CT Chest recommended if Age 15-80 years, 20 pack-year currently smoking OR have quit w/in 15years.) does not qualify.    Additional Screening:  Hepatitis C Screening: does qualify; Completed 05/08/17  Vision Screening: Recommended annual ophthalmology exams for early detection of glaucoma and other disorders of the eye. Is the patient up to date with their annual eye exam?  Yes  Who is the provider or what is the name of the office in which the patient attends annual eye exams? DR.CHEEK If pt is not established with a provider, would they like to be referred to a provider to establish care? No .   Dental Screening: Recommended annual dental exams for proper oral hygiene  Diabetic Foot Exam: Diabetic Foot Exam: Completed 07/24/22  Community Resource Referral / Chronic Care Management: CRR required this visit?  No   CCM required this visit?  No     Plan:     I have personally reviewed and noted the following in the patient's chart:   Medical and social history Use of  alcohol, tobacco or illicit drugs  Current medications and supplements including opioid prescriptions. Patient is not currently taking opioid prescriptions. Functional ability and status Nutritional status Physical activity Advanced directives List of other physicians Hospitalizations, surgeries, and ER visits in previous 12 months Vitals Screenings to include cognitive, depression, and falls Referrals and appointments  In addition, I have reviewed and discussed with patient certain preventive protocols, quality metrics, and best practice recommendations. A written personalized care plan for preventive services as well as general preventive health recommendations were provided to patient.     Hal Hope, LPN   [id number]   After Visit Summary: (MyChart) Due to this being a telephonic visit, the after visit summary with patients personalized plan was offered to patient via MyChart   Nurse Notes: NONE

## 2023-05-28 NOTE — Patient Instructions (Addendum)
[name] , Thank you for taking time to come for your Medicare Wellness Visit. I appreciate your ongoing commitment to your health goals. Please review the following plan we discussed and let me know if I can assist you in the future.   Referrals/Orders/Follow-Ups/Clinician Recommendations: NONE  This is a list of the screening recommended for you and due dates:  Health Maintenance  Topic Date Due   Zoster (Shingles) Vaccine (1 of 2) 10/13/1995   COVID-19 Vaccine (6 - 2024-25 season) 12/08/2022   Yearly kidney health urinalysis for diabetes  03/29/2023   Complete foot exam   07/24/2023   Hemoglobin A1C  07/25/2023   Eye exam for diabetics  07/30/2023   Yearly kidney function blood test for diabetes  01/24/2024   Medicare Annual Wellness Visit  05/27/2024   Pneumonia Vaccine  Completed   Flu Shot  Completed   Hepatitis C Screening  Completed   HPV Vaccine  Aged Out   DTaP/Tdap/Td vaccine  Discontinued   Colon Cancer Screening  Discontinued    Advanced directives: (ACP Link)Information on Advanced Care Planning can be found at [facility] of [place] Advance Health Care Directives Advance Health Care Directives ([email or web address]   Next Medicare Annual Wellness Visit scheduled for next year: Yes   06/02/24 @ 12:40 PM BY PHONE

## 2023-06-16 DIAGNOSIS — Z951 Presence of aortocoronary bypass graft: Secondary | ICD-10-CM | POA: Diagnosis not present

## 2023-06-16 DIAGNOSIS — I129 Hypertensive chronic kidney disease with stage 1 through stage 4 chronic kidney disease, or unspecified chronic kidney disease: Secondary | ICD-10-CM | POA: Diagnosis not present

## 2023-06-16 DIAGNOSIS — I25810 Atherosclerosis of coronary artery bypass graft(s) without angina pectoris: Secondary | ICD-10-CM | POA: Diagnosis not present

## 2023-06-16 DIAGNOSIS — E1122 Type 2 diabetes mellitus with diabetic chronic kidney disease: Secondary | ICD-10-CM | POA: Diagnosis not present

## 2023-06-16 DIAGNOSIS — N1831 Chronic kidney disease, stage 3a: Secondary | ICD-10-CM | POA: Diagnosis not present

## 2023-06-16 DIAGNOSIS — E1142 Type 2 diabetes mellitus with diabetic polyneuropathy: Secondary | ICD-10-CM | POA: Diagnosis not present

## 2023-06-16 DIAGNOSIS — M1711 Unilateral primary osteoarthritis, right knee: Secondary | ICD-10-CM | POA: Diagnosis not present

## 2023-06-16 DIAGNOSIS — Z0000 Encounter for general adult medical examination without abnormal findings: Secondary | ICD-10-CM | POA: Diagnosis not present

## 2023-06-16 DIAGNOSIS — R2681 Unsteadiness on feet: Secondary | ICD-10-CM | POA: Diagnosis not present

## 2023-06-23 DIAGNOSIS — M11261 Other chondrocalcinosis, right knee: Secondary | ICD-10-CM | POA: Diagnosis not present

## 2023-06-23 DIAGNOSIS — M1711 Unilateral primary osteoarthritis, right knee: Secondary | ICD-10-CM | POA: Diagnosis not present

## 2023-06-25 DIAGNOSIS — I2510 Atherosclerotic heart disease of native coronary artery without angina pectoris: Secondary | ICD-10-CM | POA: Diagnosis not present

## 2023-06-25 DIAGNOSIS — E7800 Pure hypercholesterolemia, unspecified: Secondary | ICD-10-CM | POA: Diagnosis not present

## 2023-07-17 ENCOUNTER — Other Ambulatory Visit: Payer: Self-pay | Admitting: Family Medicine

## 2023-07-17 DIAGNOSIS — E119 Type 2 diabetes mellitus without complications: Secondary | ICD-10-CM

## 2023-07-18 ENCOUNTER — Other Ambulatory Visit: Payer: Self-pay | Admitting: Family Medicine

## 2023-07-18 DIAGNOSIS — E1159 Type 2 diabetes mellitus with other circulatory complications: Secondary | ICD-10-CM

## 2023-08-13 ENCOUNTER — Other Ambulatory Visit: Payer: Self-pay | Admitting: Family Medicine

## 2023-08-13 DIAGNOSIS — D631 Anemia in chronic kidney disease: Secondary | ICD-10-CM | POA: Diagnosis not present

## 2023-08-13 DIAGNOSIS — E1122 Type 2 diabetes mellitus with diabetic chronic kidney disease: Secondary | ICD-10-CM | POA: Diagnosis not present

## 2023-08-13 DIAGNOSIS — E119 Type 2 diabetes mellitus without complications: Secondary | ICD-10-CM

## 2023-08-13 DIAGNOSIS — I10 Essential (primary) hypertension: Secondary | ICD-10-CM | POA: Diagnosis not present

## 2023-08-13 DIAGNOSIS — N1832 Chronic kidney disease, stage 3b: Secondary | ICD-10-CM | POA: Diagnosis not present

## 2023-08-18 DIAGNOSIS — E1122 Type 2 diabetes mellitus with diabetic chronic kidney disease: Secondary | ICD-10-CM | POA: Diagnosis not present

## 2023-08-18 DIAGNOSIS — D631 Anemia in chronic kidney disease: Secondary | ICD-10-CM | POA: Diagnosis not present

## 2023-08-18 DIAGNOSIS — N1831 Chronic kidney disease, stage 3a: Secondary | ICD-10-CM | POA: Diagnosis not present

## 2023-08-18 DIAGNOSIS — I10 Essential (primary) hypertension: Secondary | ICD-10-CM | POA: Diagnosis not present

## 2023-08-18 DIAGNOSIS — R809 Proteinuria, unspecified: Secondary | ICD-10-CM | POA: Diagnosis not present

## 2023-08-29 DIAGNOSIS — E1159 Type 2 diabetes mellitus with other circulatory complications: Secondary | ICD-10-CM | POA: Diagnosis not present

## 2023-08-29 DIAGNOSIS — R0602 Shortness of breath: Secondary | ICD-10-CM | POA: Diagnosis not present

## 2023-08-29 DIAGNOSIS — I25810 Atherosclerosis of coronary artery bypass graft(s) without angina pectoris: Secondary | ICD-10-CM | POA: Diagnosis not present

## 2023-08-29 DIAGNOSIS — I10 Essential (primary) hypertension: Secondary | ICD-10-CM | POA: Diagnosis not present

## 2023-08-29 DIAGNOSIS — I447 Left bundle-branch block, unspecified: Secondary | ICD-10-CM | POA: Diagnosis not present

## 2023-08-29 DIAGNOSIS — I25118 Atherosclerotic heart disease of native coronary artery with other forms of angina pectoris: Secondary | ICD-10-CM | POA: Diagnosis not present

## 2023-08-29 DIAGNOSIS — Z955 Presence of coronary angioplasty implant and graft: Secondary | ICD-10-CM | POA: Diagnosis not present

## 2023-08-29 DIAGNOSIS — I2510 Atherosclerotic heart disease of native coronary artery without angina pectoris: Secondary | ICD-10-CM | POA: Diagnosis not present

## 2023-08-29 DIAGNOSIS — N1831 Chronic kidney disease, stage 3a: Secondary | ICD-10-CM | POA: Diagnosis not present

## 2023-08-29 DIAGNOSIS — E7800 Pure hypercholesterolemia, unspecified: Secondary | ICD-10-CM | POA: Diagnosis not present

## 2023-09-03 ENCOUNTER — Other Ambulatory Visit: Payer: Self-pay | Admitting: Internal Medicine

## 2023-09-03 DIAGNOSIS — I2510 Atherosclerotic heart disease of native coronary artery without angina pectoris: Secondary | ICD-10-CM

## 2023-09-03 DIAGNOSIS — Z955 Presence of coronary angioplasty implant and graft: Secondary | ICD-10-CM

## 2023-09-17 DIAGNOSIS — Z951 Presence of aortocoronary bypass graft: Secondary | ICD-10-CM | POA: Diagnosis not present

## 2023-09-17 DIAGNOSIS — R3911 Hesitancy of micturition: Secondary | ICD-10-CM | POA: Diagnosis not present

## 2023-09-17 DIAGNOSIS — N401 Enlarged prostate with lower urinary tract symptoms: Secondary | ICD-10-CM | POA: Diagnosis not present

## 2023-09-17 DIAGNOSIS — E1140 Type 2 diabetes mellitus with diabetic neuropathy, unspecified: Secondary | ICD-10-CM | POA: Diagnosis not present

## 2023-09-17 DIAGNOSIS — Z1331 Encounter for screening for depression: Secondary | ICD-10-CM | POA: Diagnosis not present

## 2023-09-17 DIAGNOSIS — I10 Essential (primary) hypertension: Secondary | ICD-10-CM | POA: Diagnosis not present

## 2023-09-19 ENCOUNTER — Encounter (HOSPITAL_COMMUNITY): Payer: Self-pay

## 2023-09-19 ENCOUNTER — Telehealth (HOSPITAL_COMMUNITY): Payer: Self-pay | Admitting: Emergency Medicine

## 2023-09-19 NOTE — Telephone Encounter (Signed)
 Reaching out to patient to offer assistance regarding upcoming cardiac imaging study; pt verbalizes understanding of appt date/time, parking situation and where to check in, pre-test NPO status and medications ordered, and verified current allergies; name and call back number provided for further questions should they arise Rockwell Alexandria RN Navigator Cardiac Imaging [facility] [phone] office [phone] cell

## 2023-09-22 ENCOUNTER — Ambulatory Visit: Admission: RE | Admit: 2023-09-22 | Source: Ambulatory Visit

## 2023-09-23 ENCOUNTER — Encounter (HOSPITAL_COMMUNITY): Payer: Self-pay

## 2023-09-24 ENCOUNTER — Telehealth (HOSPITAL_COMMUNITY): Payer: Self-pay | Admitting: Emergency Medicine

## 2023-09-24 NOTE — Telephone Encounter (Signed)
 Attempted to call patient regarding upcoming cardiac CT appointment. Left message on voicemail with name and callback number Rockwell Alexandria RN Navigator Cardiac Imaging [facility] [phone] Office [phone] Cell

## 2023-09-25 ENCOUNTER — Ambulatory Visit
Admission: RE | Admit: 2023-09-25 | Discharge: 2023-09-25 | Disposition: A | Discharge status: Home / Self Care | Source: Ambulatory Visit | Attending: Internal Medicine | Admitting: Internal Medicine

## 2023-09-25 DIAGNOSIS — I2510 Atherosclerotic heart disease of native coronary artery without angina pectoris: Secondary | ICD-10-CM | POA: Insufficient documentation

## 2023-09-25 DIAGNOSIS — Z955 Presence of coronary angioplasty implant and graft: Secondary | ICD-10-CM | POA: Insufficient documentation

## 2023-09-25 LAB — POCT I-STAT CREATININE: Creatinine, Ser: 1.4 mg/dL — ABNORMAL HIGH (ref 0.61–1.24)

## 2023-09-25 MED ORDER — NITROGLYCERIN 0.4 MG SL SUBL
0.8000 mg | SUBLINGUAL_TABLET | Freq: Once | SUBLINGUAL | Status: AC
  Administered 2023-09-25: 0.8 mg via SUBLINGUAL
  Filled 2023-09-25: qty 25

## 2023-09-25 MED ORDER — IOHEXOL 350 MG/ML SOLN
80.0000 mL | Freq: Once | INTRAVENOUS | Status: AC | PRN
  Administered 2023-09-25: 80 mL via INTRAVENOUS

## 2023-09-25 MED ORDER — SODIUM CHLORIDE 0.9 % IV BOLUS: 1000.0000 mL | Freq: Once | INTRAVENOUS | Status: DC

## 2023-09-25 MED ORDER — METOPROLOL TARTRATE 5 MG/5ML IV SOLN
2.5000 mg | Freq: Once | INTRAVENOUS | Status: DC
  Filled 2023-09-25: qty 5

## 2023-09-25 NOTE — Progress Notes (Signed)
Patient tolerated procedure well. W/C to lobby.  Ambulate w/o difficulty. Denies light headedness or being dizzy. Encouraged to drink extra water today and reasoning explained. Verbalized understanding. All questions answered. ABC intact. No further needs. Discharge from procedure area w/o issues.

## 2023-10-27 ENCOUNTER — Other Ambulatory Visit: Payer: Self-pay

## 2023-10-27 DIAGNOSIS — E119 Type 2 diabetes mellitus without complications: Secondary | ICD-10-CM

## 2023-10-27 MED ORDER — GLIPIZIDE 10 MG PO TABS
ORAL_TABLET | ORAL | 0 refills | Status: AC
Start: 2023-10-27 — End: ?

## 2023-12-17 DIAGNOSIS — E1122 Type 2 diabetes mellitus with diabetic chronic kidney disease: Secondary | ICD-10-CM | POA: Diagnosis not present

## 2023-12-17 DIAGNOSIS — I10 Essential (primary) hypertension: Secondary | ICD-10-CM | POA: Diagnosis not present

## 2023-12-17 DIAGNOSIS — Z1321 Encounter for screening for nutritional disorder: Secondary | ICD-10-CM | POA: Diagnosis not present

## 2023-12-17 DIAGNOSIS — N1831 Chronic kidney disease, stage 3a: Secondary | ICD-10-CM | POA: Diagnosis not present

## 2023-12-17 DIAGNOSIS — E1140 Type 2 diabetes mellitus with diabetic neuropathy, unspecified: Secondary | ICD-10-CM | POA: Diagnosis not present

## 2023-12-17 DIAGNOSIS — Z1331 Encounter for screening for depression: Secondary | ICD-10-CM | POA: Diagnosis not present

## 2023-12-17 DIAGNOSIS — E1159 Type 2 diabetes mellitus with other circulatory complications: Secondary | ICD-10-CM | POA: Diagnosis not present

## 2023-12-23 DIAGNOSIS — D631 Anemia in chronic kidney disease: Secondary | ICD-10-CM | POA: Diagnosis not present

## 2023-12-23 DIAGNOSIS — N1831 Chronic kidney disease, stage 3a: Secondary | ICD-10-CM | POA: Diagnosis not present

## 2023-12-23 DIAGNOSIS — E1122 Type 2 diabetes mellitus with diabetic chronic kidney disease: Secondary | ICD-10-CM | POA: Diagnosis not present

## 2023-12-23 DIAGNOSIS — I10 Essential (primary) hypertension: Secondary | ICD-10-CM | POA: Diagnosis not present

## 2023-12-23 DIAGNOSIS — R809 Proteinuria, unspecified: Secondary | ICD-10-CM | POA: Diagnosis not present

## 2023-12-24 DIAGNOSIS — E119 Type 2 diabetes mellitus without complications: Secondary | ICD-10-CM | POA: Diagnosis not present

## 2023-12-24 DIAGNOSIS — Z961 Presence of intraocular lens: Secondary | ICD-10-CM | POA: Diagnosis not present

## 2024-01-23 ENCOUNTER — Other Ambulatory Visit: Payer: Self-pay | Admitting: Student

## 2024-01-23 DIAGNOSIS — E119 Type 2 diabetes mellitus without complications: Secondary | ICD-10-CM

## 2024-01-26 NOTE — Telephone Encounter (Signed)
 Requested medication (s) are due for refill today: yes  Requested medication (s) are on the active medication list: yes  Last refill:  10/08/23  Future visit scheduled: no  Notes to clinic:  Unable to refill per protocol due to failed labs, no updated results.      Requested Prescriptions  Pending Prescriptions Disp Refills   glipiZIDE  (GLUCOTROL ) 10 MG tablet [Pharmacy Med Name: GLIPIZIDE  10 MG TABLET] 180 tablet 0    Sig: TAKE 1 TABLET (10 MG TOTAL) BY MOUTH TWICE A DAY BEFORE A MEAL     Endocrinology:  Diabetes - Sulfonylureas Failed - 01/26/2024 10:25 AM      Failed - HBA1C is between 0 and 7.9 and within 180 days    Hemoglobin A1C  Date Value Ref Range Status  04/26/2022 7.3  Final   Hgb A1c MFr Bld  Date Value Ref Range Status  01/24/2023 6.9 (H) 4.8 - 5.6 % Final    Comment:             Prediabetes: 5.7 - 6.4          Diabetes: >6.4          Glycemic control for adults with diabetes: <7.0          Failed - Cr in normal range and within 360 days    Creatinine  Date Value Ref Range Status  07/16/2011 1.00 0.60 - 1.30 mg/dL Final   Creatinine, Ser  Date Value Ref Range Status  09/25/2023 1.40 (H) 0.61 - 1.24 mg/dL Final         Failed - Valid encounter within last 6 months    Recent Outpatient Visits   None

## 2024-03-01 DIAGNOSIS — E1159 Type 2 diabetes mellitus with other circulatory complications: Secondary | ICD-10-CM | POA: Diagnosis not present

## 2024-03-01 DIAGNOSIS — I447 Left bundle-branch block, unspecified: Secondary | ICD-10-CM | POA: Diagnosis not present

## 2024-03-01 DIAGNOSIS — Z951 Presence of aortocoronary bypass graft: Secondary | ICD-10-CM | POA: Diagnosis not present

## 2024-03-01 DIAGNOSIS — I25118 Atherosclerotic heart disease of native coronary artery with other forms of angina pectoris: Secondary | ICD-10-CM | POA: Diagnosis not present

## 2024-03-01 DIAGNOSIS — I10 Essential (primary) hypertension: Secondary | ICD-10-CM | POA: Diagnosis not present

## 2024-03-01 DIAGNOSIS — N1831 Chronic kidney disease, stage 3a: Secondary | ICD-10-CM | POA: Diagnosis not present

## 2024-03-01 DIAGNOSIS — I2510 Atherosclerotic heart disease of native coronary artery without angina pectoris: Secondary | ICD-10-CM | POA: Diagnosis not present

## 2024-03-01 DIAGNOSIS — E7800 Pure hypercholesterolemia, unspecified: Secondary | ICD-10-CM | POA: Diagnosis not present

## 2024-03-17 DIAGNOSIS — Z23 Encounter for immunization: Secondary | ICD-10-CM | POA: Diagnosis not present

## 2024-06-03 ENCOUNTER — Encounter
